# Patient Record
Sex: Female | Born: 1945 | Race: White | Hispanic: No | Smoking: Never smoker
Health system: Southern US, Community
[De-identification: ages and names within clinical notes are randomized; demographics above are authoritative.]

## PROBLEM LIST (undated history)

## (undated) DIAGNOSIS — I428 Other cardiomyopathies: Secondary | ICD-10-CM

## (undated) DIAGNOSIS — G4733 Obstructive sleep apnea (adult) (pediatric): Secondary | ICD-10-CM

## (undated) DIAGNOSIS — E039 Hypothyroidism, unspecified: Secondary | ICD-10-CM

## (undated) DIAGNOSIS — K219 Gastro-esophageal reflux disease without esophagitis: Secondary | ICD-10-CM

## (undated) DIAGNOSIS — G4730 Sleep apnea, unspecified: Secondary | ICD-10-CM

## (undated) DIAGNOSIS — I10 Essential (primary) hypertension: Secondary | ICD-10-CM

## (undated) DIAGNOSIS — F419 Anxiety disorder, unspecified: Secondary | ICD-10-CM

## (undated) DIAGNOSIS — E785 Hyperlipidemia, unspecified: Secondary | ICD-10-CM

## (undated) DIAGNOSIS — I669 Occlusion and stenosis of unspecified cerebral artery: Secondary | ICD-10-CM

## (undated) HISTORY — DX: Hyperlipidemia, unspecified: E78.5

## (undated) HISTORY — DX: Hypothyroidism, unspecified: E03.9

## (undated) HISTORY — DX: Gastro-esophageal reflux disease without esophagitis: K21.9

## (undated) HISTORY — PX: BLADDER NECK RECONSTRUCTION: SHX1239

## (undated) HISTORY — PX: ABDOMINAL HYSTERECTOMY: SHX81

## (undated) HISTORY — DX: Other cardiomyopathies: I42.8

## (undated) HISTORY — DX: Essential (primary) hypertension: I10

## (undated) HISTORY — DX: Sleep apnea, unspecified: G47.30

## (undated) HISTORY — DX: Occlusion and stenosis of unspecified cerebral artery: I66.9

## (undated) HISTORY — PX: TONSILLECTOMY: SUR1361

## (undated) HISTORY — DX: Obstructive sleep apnea (adult) (pediatric): G47.33

## (undated) HISTORY — DX: Anxiety disorder, unspecified: F41.9

## (undated) HISTORY — PX: SHOULDER SURGERY: SHX246

---

## 1998-12-14 ENCOUNTER — Encounter: Payer: Self-pay | Admitting: Family Medicine

## 1998-12-14 ENCOUNTER — Ambulatory Visit (HOSPITAL_COMMUNITY): Admission: RE | Admit: 1998-12-14 | Discharge: 1998-12-14 | Payer: Self-pay | Admitting: Family Medicine

## 2000-08-08 ENCOUNTER — Encounter: Payer: Self-pay | Admitting: Family Medicine

## 2000-08-08 ENCOUNTER — Ambulatory Visit (HOSPITAL_COMMUNITY): Admission: RE | Admit: 2000-08-08 | Discharge: 2000-08-08 | Payer: Self-pay | Admitting: Family Medicine

## 2001-07-09 ENCOUNTER — Encounter: Payer: Self-pay | Admitting: Family Medicine

## 2001-07-09 ENCOUNTER — Ambulatory Visit (HOSPITAL_COMMUNITY): Admission: RE | Admit: 2001-07-09 | Discharge: 2001-07-09 | Payer: Self-pay | Admitting: Family Medicine

## 2001-12-23 ENCOUNTER — Encounter: Payer: Self-pay | Admitting: Family Medicine

## 2001-12-23 ENCOUNTER — Ambulatory Visit (HOSPITAL_COMMUNITY): Admission: RE | Admit: 2001-12-23 | Discharge: 2001-12-23 | Payer: Self-pay | Admitting: Family Medicine

## 2007-08-11 ENCOUNTER — Ambulatory Visit: Admission: RE | Admit: 2007-08-11 | Discharge: 2007-08-11 | Payer: Self-pay | Admitting: Family Medicine

## 2007-08-11 ENCOUNTER — Ambulatory Visit: Payer: Self-pay | Admitting: Vascular Surgery

## 2007-08-11 ENCOUNTER — Encounter (INDEPENDENT_AMBULATORY_CARE_PROVIDER_SITE_OTHER): Payer: Self-pay | Admitting: Family Medicine

## 2009-09-29 ENCOUNTER — Encounter: Admission: RE | Admit: 2009-09-29 | Discharge: 2009-09-29 | Payer: Self-pay | Admitting: Family Medicine

## 2013-12-23 ENCOUNTER — Other Ambulatory Visit: Payer: Self-pay

## 2014-02-05 ENCOUNTER — Other Ambulatory Visit: Payer: Self-pay

## 2014-02-05 DIAGNOSIS — Z1239 Encounter for other screening for malignant neoplasm of breast: Secondary | ICD-10-CM

## 2014-02-25 ENCOUNTER — Other Ambulatory Visit: Payer: Self-pay

## 2014-02-25 DIAGNOSIS — Z1231 Encounter for screening mammogram for malignant neoplasm of breast: Secondary | ICD-10-CM

## 2014-02-26 ENCOUNTER — Ambulatory Visit: Payer: Self-pay

## 2014-03-01 ENCOUNTER — Ambulatory Visit
Admission: RE | Admit: 2014-03-01 | Discharge: 2014-03-01 | Disposition: A | Discharge status: Home / Self Care | Payer: BC Managed Care – PPO | Source: Ambulatory Visit

## 2014-03-01 DIAGNOSIS — Z1231 Encounter for screening mammogram for malignant neoplasm of breast: Secondary | ICD-10-CM

## 2014-03-04 ENCOUNTER — Other Ambulatory Visit: Payer: Self-pay | Admitting: Family Medicine

## 2014-03-04 DIAGNOSIS — R928 Other abnormal and inconclusive findings on diagnostic imaging of breast: Secondary | ICD-10-CM

## 2014-03-09 ENCOUNTER — Other Ambulatory Visit: Payer: Self-pay | Admitting: Radiology

## 2014-03-16 ENCOUNTER — Other Ambulatory Visit: Payer: BC Managed Care – PPO

## 2014-06-07 DIAGNOSIS — M9901 Segmental and somatic dysfunction of cervical region: Secondary | ICD-10-CM | POA: Diagnosis not present

## 2014-06-07 DIAGNOSIS — M9902 Segmental and somatic dysfunction of thoracic region: Secondary | ICD-10-CM | POA: Diagnosis not present

## 2014-06-07 DIAGNOSIS — M546 Pain in thoracic spine: Secondary | ICD-10-CM | POA: Diagnosis not present

## 2014-06-07 DIAGNOSIS — M542 Cervicalgia: Secondary | ICD-10-CM | POA: Diagnosis not present

## 2014-06-07 DIAGNOSIS — M62830 Muscle spasm of back: Secondary | ICD-10-CM | POA: Diagnosis not present

## 2014-06-07 DIAGNOSIS — M9903 Segmental and somatic dysfunction of lumbar region: Secondary | ICD-10-CM | POA: Diagnosis not present

## 2014-06-10 DIAGNOSIS — M542 Cervicalgia: Secondary | ICD-10-CM | POA: Diagnosis not present

## 2014-06-10 DIAGNOSIS — M546 Pain in thoracic spine: Secondary | ICD-10-CM | POA: Diagnosis not present

## 2014-06-10 DIAGNOSIS — M62830 Muscle spasm of back: Secondary | ICD-10-CM | POA: Diagnosis not present

## 2014-06-10 DIAGNOSIS — M9903 Segmental and somatic dysfunction of lumbar region: Secondary | ICD-10-CM | POA: Diagnosis not present

## 2014-06-10 DIAGNOSIS — M9901 Segmental and somatic dysfunction of cervical region: Secondary | ICD-10-CM | POA: Diagnosis not present

## 2014-06-10 DIAGNOSIS — M9902 Segmental and somatic dysfunction of thoracic region: Secondary | ICD-10-CM | POA: Diagnosis not present

## 2014-06-14 DIAGNOSIS — M542 Cervicalgia: Secondary | ICD-10-CM | POA: Diagnosis not present

## 2014-06-14 DIAGNOSIS — M9902 Segmental and somatic dysfunction of thoracic region: Secondary | ICD-10-CM | POA: Diagnosis not present

## 2014-06-14 DIAGNOSIS — M9901 Segmental and somatic dysfunction of cervical region: Secondary | ICD-10-CM | POA: Diagnosis not present

## 2014-06-14 DIAGNOSIS — M546 Pain in thoracic spine: Secondary | ICD-10-CM | POA: Diagnosis not present

## 2014-06-14 DIAGNOSIS — M62830 Muscle spasm of back: Secondary | ICD-10-CM | POA: Diagnosis not present

## 2014-06-14 DIAGNOSIS — M9903 Segmental and somatic dysfunction of lumbar region: Secondary | ICD-10-CM | POA: Diagnosis not present

## 2014-06-23 DIAGNOSIS — M62830 Muscle spasm of back: Secondary | ICD-10-CM | POA: Diagnosis not present

## 2014-06-23 DIAGNOSIS — M542 Cervicalgia: Secondary | ICD-10-CM | POA: Diagnosis not present

## 2014-06-23 DIAGNOSIS — M9901 Segmental and somatic dysfunction of cervical region: Secondary | ICD-10-CM | POA: Diagnosis not present

## 2014-06-23 DIAGNOSIS — M9902 Segmental and somatic dysfunction of thoracic region: Secondary | ICD-10-CM | POA: Diagnosis not present

## 2014-06-23 DIAGNOSIS — M9903 Segmental and somatic dysfunction of lumbar region: Secondary | ICD-10-CM | POA: Diagnosis not present

## 2014-06-23 DIAGNOSIS — M546 Pain in thoracic spine: Secondary | ICD-10-CM | POA: Diagnosis not present

## 2014-06-30 DIAGNOSIS — M9902 Segmental and somatic dysfunction of thoracic region: Secondary | ICD-10-CM | POA: Diagnosis not present

## 2014-06-30 DIAGNOSIS — M542 Cervicalgia: Secondary | ICD-10-CM | POA: Diagnosis not present

## 2014-06-30 DIAGNOSIS — M9901 Segmental and somatic dysfunction of cervical region: Secondary | ICD-10-CM | POA: Diagnosis not present

## 2014-06-30 DIAGNOSIS — M9903 Segmental and somatic dysfunction of lumbar region: Secondary | ICD-10-CM | POA: Diagnosis not present

## 2014-06-30 DIAGNOSIS — M62830 Muscle spasm of back: Secondary | ICD-10-CM | POA: Diagnosis not present

## 2014-06-30 DIAGNOSIS — M546 Pain in thoracic spine: Secondary | ICD-10-CM | POA: Diagnosis not present

## 2014-07-06 DIAGNOSIS — E039 Hypothyroidism, unspecified: Secondary | ICD-10-CM | POA: Diagnosis not present

## 2014-07-06 DIAGNOSIS — I10 Essential (primary) hypertension: Secondary | ICD-10-CM | POA: Diagnosis not present

## 2014-07-14 DIAGNOSIS — M542 Cervicalgia: Secondary | ICD-10-CM | POA: Diagnosis not present

## 2014-07-14 DIAGNOSIS — M9903 Segmental and somatic dysfunction of lumbar region: Secondary | ICD-10-CM | POA: Diagnosis not present

## 2014-07-14 DIAGNOSIS — M62830 Muscle spasm of back: Secondary | ICD-10-CM | POA: Diagnosis not present

## 2014-07-14 DIAGNOSIS — M9902 Segmental and somatic dysfunction of thoracic region: Secondary | ICD-10-CM | POA: Diagnosis not present

## 2014-07-14 DIAGNOSIS — M9901 Segmental and somatic dysfunction of cervical region: Secondary | ICD-10-CM | POA: Diagnosis not present

## 2014-07-14 DIAGNOSIS — M546 Pain in thoracic spine: Secondary | ICD-10-CM | POA: Diagnosis not present

## 2016-05-09 DIAGNOSIS — R946 Abnormal results of thyroid function studies: Secondary | ICD-10-CM | POA: Diagnosis not present

## 2016-05-09 DIAGNOSIS — R944 Abnormal results of kidney function studies: Secondary | ICD-10-CM | POA: Diagnosis not present

## 2016-05-28 DIAGNOSIS — E785 Hyperlipidemia, unspecified: Secondary | ICD-10-CM | POA: Diagnosis not present

## 2016-05-28 DIAGNOSIS — I429 Cardiomyopathy, unspecified: Secondary | ICD-10-CM | POA: Diagnosis not present

## 2016-05-28 DIAGNOSIS — I428 Other cardiomyopathies: Secondary | ICD-10-CM | POA: Diagnosis not present

## 2016-05-28 DIAGNOSIS — I10 Essential (primary) hypertension: Secondary | ICD-10-CM | POA: Diagnosis not present

## 2016-05-28 DIAGNOSIS — E039 Hypothyroidism, unspecified: Secondary | ICD-10-CM | POA: Diagnosis not present

## 2016-05-28 DIAGNOSIS — R9431 Abnormal electrocardiogram [ECG] [EKG]: Secondary | ICD-10-CM | POA: Diagnosis not present

## 2016-05-28 DIAGNOSIS — I447 Left bundle-branch block, unspecified: Secondary | ICD-10-CM | POA: Diagnosis not present

## 2016-06-13 DIAGNOSIS — R69 Illness, unspecified: Secondary | ICD-10-CM | POA: Diagnosis not present

## 2016-07-09 DIAGNOSIS — E039 Hypothyroidism, unspecified: Secondary | ICD-10-CM | POA: Diagnosis not present

## 2016-11-26 DIAGNOSIS — I429 Cardiomyopathy, unspecified: Secondary | ICD-10-CM | POA: Diagnosis not present

## 2016-11-26 DIAGNOSIS — I447 Left bundle-branch block, unspecified: Secondary | ICD-10-CM | POA: Diagnosis not present

## 2016-11-26 DIAGNOSIS — I428 Other cardiomyopathies: Secondary | ICD-10-CM | POA: Diagnosis not present

## 2016-11-26 DIAGNOSIS — E785 Hyperlipidemia, unspecified: Secondary | ICD-10-CM | POA: Diagnosis not present

## 2016-11-26 DIAGNOSIS — E039 Hypothyroidism, unspecified: Secondary | ICD-10-CM | POA: Diagnosis not present

## 2016-11-26 DIAGNOSIS — I119 Hypertensive heart disease without heart failure: Secondary | ICD-10-CM | POA: Diagnosis not present

## 2016-11-26 DIAGNOSIS — I10 Essential (primary) hypertension: Secondary | ICD-10-CM | POA: Diagnosis not present

## 2016-12-11 DIAGNOSIS — R69 Illness, unspecified: Secondary | ICD-10-CM | POA: Diagnosis not present

## 2016-12-11 DIAGNOSIS — M7751 Other enthesopathy of right foot: Secondary | ICD-10-CM | POA: Diagnosis not present

## 2016-12-11 DIAGNOSIS — G5761 Lesion of plantar nerve, right lower limb: Secondary | ICD-10-CM | POA: Diagnosis not present

## 2016-12-18 DIAGNOSIS — M7731 Calcaneal spur, right foot: Secondary | ICD-10-CM | POA: Diagnosis not present

## 2016-12-18 DIAGNOSIS — M7751 Other enthesopathy of right foot: Secondary | ICD-10-CM | POA: Diagnosis not present

## 2016-12-18 DIAGNOSIS — G5761 Lesion of plantar nerve, right lower limb: Secondary | ICD-10-CM | POA: Diagnosis not present

## 2017-02-22 DIAGNOSIS — Z23 Encounter for immunization: Secondary | ICD-10-CM | POA: Diagnosis not present

## 2017-03-22 DIAGNOSIS — M71571 Other bursitis, not elsewhere classified, right ankle and foot: Secondary | ICD-10-CM | POA: Diagnosis not present

## 2017-03-22 DIAGNOSIS — G5761 Lesion of plantar nerve, right lower limb: Secondary | ICD-10-CM | POA: Diagnosis not present

## 2017-03-22 DIAGNOSIS — M722 Plantar fascial fibromatosis: Secondary | ICD-10-CM | POA: Diagnosis not present

## 2017-03-29 DIAGNOSIS — M71571 Other bursitis, not elsewhere classified, right ankle and foot: Secondary | ICD-10-CM | POA: Diagnosis not present

## 2017-03-29 DIAGNOSIS — M722 Plantar fascial fibromatosis: Secondary | ICD-10-CM | POA: Diagnosis not present

## 2017-05-29 DIAGNOSIS — R69 Illness, unspecified: Secondary | ICD-10-CM | POA: Diagnosis not present

## 2017-06-03 DIAGNOSIS — I447 Left bundle-branch block, unspecified: Secondary | ICD-10-CM | POA: Diagnosis not present

## 2017-06-03 DIAGNOSIS — I119 Hypertensive heart disease without heart failure: Secondary | ICD-10-CM | POA: Diagnosis not present

## 2017-06-03 DIAGNOSIS — E785 Hyperlipidemia, unspecified: Secondary | ICD-10-CM | POA: Diagnosis not present

## 2017-06-03 DIAGNOSIS — I429 Cardiomyopathy, unspecified: Secondary | ICD-10-CM | POA: Diagnosis not present

## 2017-06-03 DIAGNOSIS — E7849 Other hyperlipidemia: Secondary | ICD-10-CM | POA: Diagnosis not present

## 2017-06-03 DIAGNOSIS — I428 Other cardiomyopathies: Secondary | ICD-10-CM | POA: Diagnosis not present

## 2017-06-03 DIAGNOSIS — E039 Hypothyroidism, unspecified: Secondary | ICD-10-CM | POA: Diagnosis not present

## 2017-06-25 DIAGNOSIS — Z1231 Encounter for screening mammogram for malignant neoplasm of breast: Secondary | ICD-10-CM | POA: Diagnosis not present

## 2017-08-06 DIAGNOSIS — I10 Essential (primary) hypertension: Secondary | ICD-10-CM | POA: Diagnosis not present

## 2017-08-06 DIAGNOSIS — J069 Acute upper respiratory infection, unspecified: Secondary | ICD-10-CM | POA: Diagnosis not present

## 2017-08-06 DIAGNOSIS — E785 Hyperlipidemia, unspecified: Secondary | ICD-10-CM | POA: Diagnosis not present

## 2017-08-06 DIAGNOSIS — R400 Somnolence: Secondary | ICD-10-CM | POA: Diagnosis not present

## 2017-08-06 DIAGNOSIS — E039 Hypothyroidism, unspecified: Secondary | ICD-10-CM | POA: Diagnosis not present

## 2017-09-05 DIAGNOSIS — G4733 Obstructive sleep apnea (adult) (pediatric): Secondary | ICD-10-CM | POA: Diagnosis not present

## 2017-09-10 DIAGNOSIS — H524 Presbyopia: Secondary | ICD-10-CM | POA: Diagnosis not present

## 2017-09-10 DIAGNOSIS — H35363 Drusen (degenerative) of macula, bilateral: Secondary | ICD-10-CM | POA: Diagnosis not present

## 2017-10-03 DIAGNOSIS — G4733 Obstructive sleep apnea (adult) (pediatric): Secondary | ICD-10-CM | POA: Diagnosis not present

## 2017-10-17 DIAGNOSIS — H35363 Drusen (degenerative) of macula, bilateral: Secondary | ICD-10-CM | POA: Diagnosis not present

## 2017-10-24 DIAGNOSIS — M722 Plantar fascial fibromatosis: Secondary | ICD-10-CM | POA: Diagnosis not present

## 2017-12-02 DIAGNOSIS — I429 Cardiomyopathy, unspecified: Secondary | ICD-10-CM | POA: Diagnosis not present

## 2017-12-02 DIAGNOSIS — E039 Hypothyroidism, unspecified: Secondary | ICD-10-CM | POA: Diagnosis not present

## 2017-12-02 DIAGNOSIS — E669 Obesity, unspecified: Secondary | ICD-10-CM | POA: Diagnosis not present

## 2017-12-02 DIAGNOSIS — I447 Left bundle-branch block, unspecified: Secondary | ICD-10-CM | POA: Diagnosis not present

## 2017-12-02 DIAGNOSIS — I119 Hypertensive heart disease without heart failure: Secondary | ICD-10-CM | POA: Diagnosis not present

## 2017-12-02 DIAGNOSIS — E785 Hyperlipidemia, unspecified: Secondary | ICD-10-CM | POA: Diagnosis not present

## 2017-12-02 DIAGNOSIS — E7849 Other hyperlipidemia: Secondary | ICD-10-CM | POA: Diagnosis not present

## 2017-12-02 DIAGNOSIS — I428 Other cardiomyopathies: Secondary | ICD-10-CM | POA: Diagnosis not present

## 2018-03-11 DIAGNOSIS — K219 Gastro-esophageal reflux disease without esophagitis: Secondary | ICD-10-CM | POA: Diagnosis not present

## 2018-03-11 DIAGNOSIS — Z23 Encounter for immunization: Secondary | ICD-10-CM | POA: Diagnosis not present

## 2018-03-11 DIAGNOSIS — Z1389 Encounter for screening for other disorder: Secondary | ICD-10-CM | POA: Diagnosis not present

## 2018-03-11 DIAGNOSIS — E039 Hypothyroidism, unspecified: Secondary | ICD-10-CM | POA: Diagnosis not present

## 2018-03-11 DIAGNOSIS — I429 Cardiomyopathy, unspecified: Secondary | ICD-10-CM | POA: Diagnosis not present

## 2018-03-11 DIAGNOSIS — Z1211 Encounter for screening for malignant neoplasm of colon: Secondary | ICD-10-CM | POA: Diagnosis not present

## 2018-03-11 DIAGNOSIS — E785 Hyperlipidemia, unspecified: Secondary | ICD-10-CM | POA: Diagnosis not present

## 2018-03-11 DIAGNOSIS — Z0000 Encounter for general adult medical examination without abnormal findings: Secondary | ICD-10-CM | POA: Diagnosis not present

## 2018-03-11 DIAGNOSIS — I10 Essential (primary) hypertension: Secondary | ICD-10-CM | POA: Diagnosis not present

## 2018-03-17 DIAGNOSIS — Z1211 Encounter for screening for malignant neoplasm of colon: Secondary | ICD-10-CM | POA: Diagnosis not present

## 2018-06-04 ENCOUNTER — Ambulatory Visit: Payer: Medicare HMO | Admitting: Cardiology

## 2018-06-04 ENCOUNTER — Encounter: Payer: Self-pay | Admitting: Cardiology

## 2018-06-04 DIAGNOSIS — E785 Hyperlipidemia, unspecified: Secondary | ICD-10-CM | POA: Diagnosis not present

## 2018-06-04 DIAGNOSIS — I428 Other cardiomyopathies: Secondary | ICD-10-CM

## 2018-06-04 DIAGNOSIS — I447 Left bundle-branch block, unspecified: Secondary | ICD-10-CM

## 2018-06-04 DIAGNOSIS — I10 Essential (primary) hypertension: Secondary | ICD-10-CM | POA: Insufficient documentation

## 2018-06-04 DIAGNOSIS — I5043 Acute on chronic combined systolic (congestive) and diastolic (congestive) heart failure: Secondary | ICD-10-CM | POA: Insufficient documentation

## 2018-06-04 HISTORY — DX: Other cardiomyopathies: I42.8

## 2018-06-04 HISTORY — DX: Left bundle-branch block, unspecified: I44.7

## 2018-06-04 MED ORDER — ATORVASTATIN CALCIUM 10 MG PO TABS: 10.0000 mg | ORAL_TABLET | Freq: Every day | ORAL | 3 refills | Status: DC

## 2018-06-04 NOTE — Patient Instructions (Addendum)
Medication Instructions:  Your physician has recommended you make the following change in your medication:   START atorvastatin (lipitor) 10 mg: Take 1 tablet daily  If you need a refill on your cardiac medications before your next appointment, please call your pharmacy.   Lab work: Your physician recommends that you return for lab work in 6 weeks: lipid panel, hepatic function panel. Please return to our office for lab work, no appointment needed. Please fast beforehand.   If you have labs (blood work) drawn today and your tests are completely normal, you will receive your results only by: Marland Kitchen MyChart Message (if you have MyChart) OR . A paper copy in the mail If you have any lab test that is abnormal or we need to change your treatment, we will call you to review the results.  Testing/Procedures: You had an EKG today.   Follow-Up: At [facility], you and your health needs are our priority.  As part of our continuing mission to provide you with exceptional heart care, we have created designated Provider Care Teams.  These Care Teams include your primary Cardiologist (physician) and Advanced Practice Providers (APPs -  Physician Assistants and Nurse Practitioners) who all work together to provide you with the care you need, when you need it. You will need a follow up appointment in 6 months.  Please call our office 2 months in advance to schedule this appointment.      Atorvastatin tablets What is this medicine? ATORVASTATIN (a TORE va sta tin) is known as a HMG-CoA reductase inhibitor or 'statin'. It lowers the level of cholesterol and triglycerides in the blood. This drug may also reduce the risk of heart attack, stroke, or other health problems in patients with risk factors for heart disease. Diet and lifestyle changes are often used with this drug. This medicine may be used for other purposes; ask your health care provider or pharmacist if you have questions. COMMON BRAND NAME(S):  Lipitor What should I tell my health care provider before I take this medicine? They need to know if you have any of these conditions: -diabetes -if you often drink alcohol -history of stroke -kidney disease -liver disease -muscle aches or weakness -thyroid disease -an unusual or allergic reaction to atorvastatin, other medicines, foods, dyes, or preservatives -pregnant or trying to get pregnant -breast-feeding How should I use this medicine? Take this medicine by mouth with a glass of water. Follow the directions on the prescription label. You can take it with or without food. If it upsets your stomach, take it with food. Do not take with grapefruit juice. Take your medicine at regular intervals. Do not take it more often than directed. Do not stop taking except on your doctor's advice. Talk to your pediatrician regarding the use of this medicine in children. While this drug may be prescribed for children as young as 10 for selected conditions, precautions do apply. Overdosage: If you think you have taken too much of this medicine contact a poison control center or emergency room at once. NOTE: This medicine is only for you. Do not share this medicine with others. What if I miss a dose? If you miss a dose, take it as soon as you can. If your next dose is to be taken in less than 12 hours, then do not take the missed dose. Take the next dose at your regular time. Do not take double or extra doses. What may interact with this medicine? Do not take this medicine with  any of the following medications: -dasabuvir; ombitasvir; paritaprevir; ritonavir -ombitasvir; paritaprevir; ritonavir -posaconazole -red yeast rice This medicine may also interact with the following medications: -alcohol -birth control pills -certain antibiotics like erythromycin and clarithromycin -certain antivirals for HIV or hepatitis -certain medicines for cholesterol like fenofibrate, gemfibrozil, and niacin -certain  medicines for fungal infections like ketoconazole and itraconazole -colchicine -cyclosporine -digoxin -grapefruit juice -rifampin This list may not describe all possible interactions. Give your health care provider a list of all the medicines, herbs, non-prescription drugs, or dietary supplements you use. Also tell them if you smoke, drink alcohol, or use illegal drugs. Some items may interact with your medicine. What should I watch for while using this medicine? Visit your doctor or health care professional for regular check-ups. You may need regular tests to make sure your liver is working properly. Your health care professional may tell you to stop taking this medicine if you develop muscle problems. If your muscle problems do not go away after stopping this medicine, contact your health care professional. Do not become pregnant while taking this medicine. Women should inform their health care professional if they wish to become pregnant or think they might be pregnant. There is a potential for serious side effects to an unborn child. Talk to your health care professional or pharmacist for more information. Do not breast-feed an infant while taking this medicine. This medicine may affect blood sugar levels. If you have diabetes, check with your doctor or health care professional before you change your diet or the dose of your diabetic medicine. If you are going to need surgery or other procedure, tell your doctor that you are using this medicine. This drug is only part of a total heart-health program. Your doctor or a dietician can suggest a low-cholesterol and low-fat diet to help. Avoid alcohol and smoking, and keep a proper exercise schedule. This medicine may cause a decrease in Co-Enzyme Q-10. You should make sure that you get enough Co-Enzyme Q-10 while you are taking this medicine. Discuss the foods you eat and the vitamins you take with your health care professional. What side effects may I  notice from receiving this medicine? Side effects that you should report to your doctor or health care professional as soon as possible: -allergic reactions like skin rash, itching or hives, swelling of the face, lips, or tongue -fever -joint pain -loss of memory -redness, blistering, peeling or loosening of the skin, including inside the mouth -signs and symptoms of liver injury like dark yellow or brown urine; general ill feeling or flu-like symptoms; light-belly pain; unusually weak or tired; yellowing of the eyes or skin -signs and symptoms of muscle injury like dark urine; trouble passing urine or change in the amount of urine; unusually weak or tired; muscle pain or side or back pain Side effects that usually do not require medical attention (report to your doctor or health care professional if they continue or are bothersome): -diarrhea -nausea -stomach pain -trouble sleeping -upset stomach This list may not describe all possible side effects. Call your doctor for medical advice about side effects. You may report side effects to FDA at 1-800-FDA-1088. Where should I keep my medicine? Keep out of the reach of children. Store between 20 and 25 degrees C (68 and 77 degrees F). Throw away any unused medicine after the expiration date. NOTE: This sheet is a summary. It may not cover all possible information. If you have questions about this medicine, talk to your doctor, pharmacist, or  health care provider.  2019 Elsevier/Gold Standard (2017-08-23 11:36:48)

## 2018-06-04 NOTE — Progress Notes (Signed)
Cardiology Office Note:    Date:  06/04/2018   ID:  Stefanie Camacho, DOB [birth year], MRN [id number]  PCP:  [name], MD  Cardiologist:  Jenne Campus, MD    Referring MD: No ref. provider found   Chief Complaint  Patient presents with  . Follow-up  Doing well  History of Present Illness:    Stefanie Camacho is a 73 y.o. female who is patient of Dr. Wynonia Lawman she does have chronic left bundle branch block.  3 years ago she had a stress test which was negative last year she got echocardiogram which showed ejection fraction 45% which is stable overall she is doing well denies have any chest pain tightness squeezing pressure burning chest.  She walks on a regular basis with her dog named Lucy.  Denies have any swelling of lower extremities no dizziness no passing out overall doing well  No past medical history on file.    Current Medications: Current Meds  Medication Sig  . aspirin EC 81 MG tablet Take 81 mg by mouth daily.  . Cyanocobalamin (B-12) 2500 MCG TABS Take 1 tablet by mouth daily.  Marland Kitchen levothyroxine (SYNTHROID, LEVOTHROID) 25 MCG tablet Take 1 tablet by mouth daily.  Marland Kitchen losartan (COZAAR) 100 MG tablet Take 100 mg by mouth daily.  . metoprolol succinate (TOPROL-XL) 50 MG 24 hr tablet Take 50 mg by mouth daily.  Marland Kitchen omeprazole (PRILOSEC) 20 MG capsule Take 20 mg by mouth daily.  Marland Kitchen spironolactone (ALDACTONE) 25 MG tablet Take 12.5 mg by mouth daily.     Allergies:   Patient has no known allergies.   Social History   Socioeconomic History  . Marital status: Divorced    Spouse name: Not on file  . Number of children: Not on file  . Years of education: Not on file  . Highest education level: Not on file  Occupational History  . Not on file  Social Needs  . Financial resource strain: Not on file  . Food insecurity:    Worry: Not on file    Inability: Not on file  . Transportation needs:    Medical: Not on file    Non-medical: Not on file  Tobacco Use  . Smoking  status: Never Smoker  . Smokeless tobacco: Never Used  Substance and Sexual Activity  . Alcohol use: Not Currently  . Drug use: Never  . Sexual activity: Not on file  Lifestyle  . Physical activity:    Days per week: Not on file    Minutes per session: Not on file  . Stress: Not on file  Relationships  . Social connections:    Talks on phone: Not on file    Gets together: Not on file    Attends religious service: Not on file    Active member of club or organization: Not on file    Attends meetings of clubs or organizations: Not on file    Relationship status: Not on file  Other Topics Concern  . Not on file  Social History Narrative  . Not on file     Family History: The patient's family history includes Heart disease in her father, maternal grandfather, mother, and paternal grandfather; Stroke in her father and mother. ROS:   Please see the history of present illness.    All 14 point review of systems negative except as described per history of present illness  EKGs/Labs/Other Studies Reviewed:      Recent Labs: No results found for requested labs within  last 8760 hours.  Recent Lipid Panel No results found for: CHOL, TRIG, HDL, CHOLHDL, VLDL, LDLCALC, LDLDIRECT  Physical Exam:    VS:  BP 124/74   Pulse 67   Ht 5\' 6"  (1.676 m)   Wt 198 lb 6.4 oz (90 kg)   SpO2 97%   BMI 32.02 kg/m     Wt Readings from Last 3 Encounters:  06/04/18 198 lb 6.4 oz (90 kg)     GEN:  Well nourished, well developed in no acute distress HEENT: Normal NECK: No JVD; No carotid bruits LYMPHATICS: No lymphadenopathy CARDIAC: RRR, no murmurs, no rubs, no gallops RESPIRATORY:  Clear to auscultation without rales, wheezing or rhonchi  ABDOMEN: Soft, non-tender, non-distended MUSCULOSKELETAL:  No edema; No deformity  SKIN: Warm and dry LOWER EXTREMITIES: no swelling NEUROLOGIC:  Alert and oriented x 3 PSYCHIATRIC:  Normal affect   ASSESSMENT:    1. Nonischemic cardiomyopathy  ([place])   2. Essential hypertension   3. Left bundle branch block   4. Dyslipidemia    PLAN:    In order of problems listed above:  1. 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  We spent very little time talking about the reason for this medication she understands she will continue. 2. Essential hypertension blood pressure well controlled continue present management. 3. Left bundle branch block we will repeat EKG and will give her a copy 4. Dyslipidemia I was able to convince her to start small dose of statin will start with 10 mg Lipitor.  In 6 weeks we will recheck fasting lipid profile as well as liver function test.   Medication Adjustments/Labs and Tests Ordered: Current medicines are reviewed at length with the patient today.  Concerns regarding medicines are outlined above.  No orders of the defined types were placed in this encounter.  Medication changes: No orders of the defined types were placed in this encounter.   Signed, Park Liter, MD, [facility] 06/04/2018 9:01 AM    [place]

## 2018-07-07 ENCOUNTER — Telehealth: Payer: Self-pay | Admitting: Cardiology

## 2018-07-07 MED ORDER — LOSARTAN POTASSIUM 100 MG PO TABS: 100.0000 mg | ORAL_TABLET | Freq: Every day | ORAL | 0 refills | Status: DC

## 2018-07-07 NOTE — Telephone Encounter (Signed)
Losartan 100 mg daily refilled

## 2018-07-07 NOTE — Telephone Encounter (Signed)
°*  STAT* If patient is at the pharmacy, call can be transferred to refill team.   1. Which medications need to be refilled? (please list name of each medication and dose if known) Losartan 100mg   2. Which pharmacy/location (including street and city if local pharmacy) is [facility] [address] [place]  3. Do they need a 30 day or 90 day supply? [place]

## 2018-07-11 DIAGNOSIS — R69 Illness, unspecified: Secondary | ICD-10-CM | POA: Diagnosis not present

## 2018-07-14 ENCOUNTER — Telehealth: Payer: Self-pay | Admitting: *Deleted

## 2018-07-14 MED ORDER — METOPROLOL SUCCINATE ER 50 MG PO TB24: 50.0000 mg | ORAL_TABLET | Freq: Every day | ORAL | 1 refills | Status: DC

## 2018-07-14 NOTE — Telephone Encounter (Signed)
*  STAT* If patient is at the pharmacy, call can be transferred to refill team.   1. Which medications need to be refilled? (please list name of each medication and dose if known) Metoprolol 50mg  qd  2. Which pharmacy/location (including street and city if local pharmacy) is medication to be sent to?[facility] [address]   3. Do they need a 30 day or 90 day supply? [place]

## 2018-07-16 DIAGNOSIS — Z1231 Encounter for screening mammogram for malignant neoplasm of breast: Secondary | ICD-10-CM | POA: Diagnosis not present

## 2018-07-17 DIAGNOSIS — E785 Hyperlipidemia, unspecified: Secondary | ICD-10-CM | POA: Diagnosis not present

## 2018-07-18 LAB — LIPID PANEL
Chol/HDL Ratio: 3.3 ratio (ref 0.0–4.4)
Cholesterol, Total: 133 mg/dL (ref 100–199)
HDL: 40 mg/dL (ref >39)
LDL Calculated: 67 mg/dL (ref 0–99)
Triglycerides: 130 mg/dL (ref 0–149)
VLDL Cholesterol Cal: 26 mg/dL (ref 5–40)

## 2018-07-18 LAB — HEPATIC FUNCTION PANEL
ALT: 28 IU/L (ref 0–32)
AST: 26 IU/L (ref 0–40)
Albumin: 4.1 g/dL (ref 3.7–4.7)
Alkaline Phosphatase: 137 IU/L — ABNORMAL HIGH (ref 39–117)
Bilirubin Total: 0.6 mg/dL (ref 0.0–1.2)
Bilirubin, Direct: 0.15 mg/dL (ref 0.00–0.40)
Total Protein: 6.9 g/dL (ref 6.0–8.5)

## 2018-07-28 ENCOUNTER — Telehealth: Payer: Self-pay | Admitting: Cardiology

## 2018-07-28 MED ORDER — SPIRONOLACTONE 25 MG PO TABS: 12.5000 mg | ORAL_TABLET | Freq: Every day | ORAL | 1 refills | Status: DC

## 2018-07-28 NOTE — Telephone Encounter (Signed)
Patient needs refill on her ALDACTONE sent to her pharmacy,  [id number] - [place], [place] - [address] [phone] (Phone) [phone] (Fax)

## 2018-07-28 NOTE — Telephone Encounter (Signed)
Aldactone 12.5 mg daily refilled.

## 2018-09-09 DIAGNOSIS — E039 Hypothyroidism, unspecified: Secondary | ICD-10-CM | POA: Diagnosis not present

## 2018-09-09 DIAGNOSIS — I10 Essential (primary) hypertension: Secondary | ICD-10-CM | POA: Diagnosis not present

## 2018-09-09 DIAGNOSIS — K219 Gastro-esophageal reflux disease without esophagitis: Secondary | ICD-10-CM | POA: Diagnosis not present

## 2018-09-09 DIAGNOSIS — R200 Anesthesia of skin: Secondary | ICD-10-CM | POA: Diagnosis not present

## 2018-09-21 ENCOUNTER — Other Ambulatory Visit: Payer: Self-pay | Admitting: Cardiology

## 2018-10-06 ENCOUNTER — Other Ambulatory Visit: Payer: Self-pay

## 2018-10-06 MED ORDER — LOSARTAN POTASSIUM 100 MG PO TABS: 100.0000 mg | ORAL_TABLET | Freq: Every day | ORAL | 0 refills | Status: DC

## 2018-10-08 DIAGNOSIS — R69 Illness, unspecified: Secondary | ICD-10-CM | POA: Diagnosis not present

## 2018-12-19 DIAGNOSIS — I10 Essential (primary) hypertension: Secondary | ICD-10-CM | POA: Diagnosis not present

## 2018-12-28 ENCOUNTER — Encounter: Payer: Self-pay | Admitting: Family

## 2018-12-28 DIAGNOSIS — I428 Other cardiomyopathies: Secondary | ICD-10-CM | POA: Insufficient documentation

## 2018-12-28 DIAGNOSIS — E039 Hypothyroidism, unspecified: Secondary | ICD-10-CM | POA: Insufficient documentation

## 2018-12-28 NOTE — Progress Notes (Signed)
Office Visit    Patient Name: Stefanie Camacho Date of Encounter: 12/29/2018  Primary Care Provider:  [name], MD Primary Cardiologist:  Jenne Campus, MD  Chief Complaint    73 yo female with PMH HTN, DLD, NICM presents for six month follow up of cardiac conditions.  Past Medical History    Past Medical History:  Diagnosis Date  . Anxiety   . Dyslipidemia   . Essential hypertension   . GERD (gastroesophageal reflux disease)   . Hypothyroidism   . NICM (nonischemic cardiomyopathy) [facility])    Past Surgical History:  Procedure Laterality Date  . ABDOMINAL HYSTERECTOMY    . BLADDER NECK RECONSTRUCTION    . SHOULDER SURGERY      Allergies  No Known Allergies  History of Present Illness    Stefanie Camacho is a 73 y.o. female with a hx of LBBB, NICM, HTN, DLD, hypothyroidism last seen 06/04/18. She had a normal stress test around 2017 per previous notes. She had an echo 2019 per previous notes with EF 45%.   Reports feeling overwhelmed well.  She does endorse some tiredness and fatigue.  She did a telephone call with her PCP in June and was started on citalopram at 10 mg.  She does endorse that she originally was taking in the morning and has had some improvement in her daytime to fatigue after switching dosing to in the evening.  She does not check her blood pressure routinely at home.  She tells me she intermittently gets lightheaded with positional changes.  She does have a history of vertigo.  She denies palpitations, chest pain, edema.  She does endorse episodes of "deep breathing "where she will be sitting still and suddenly take a deep breath.  Tells me this is not bothersome, not associated with palpitation nor chest pain.  Reports exercising by playing with her 22-year-old granddaughter and walking her dog.  She has some mild dyspnea while walking the dog but reports this is the same as 6 months ago as she walks her dog up a hill.  Brings me labs from her PCP for  review on 12/19/2018 with normal BUN, normal creatinine, GFR 55, normal electrolytes, normal liver function.  She is concerned that her GFR is 55 and we discussed mechanisms of protecting her kidneys such as losartan and remaining adequately hydrated.  EKGs/Labs/Other Studies Reviewed:   The following studies were reviewed today:  EKG:  No EKG today. EKG reviewed from 06/04/18 shows SR with LBBB.  Recent Labs: 07/17/2018: ALT 28  Recent Lipid Panel    Component Value Date/Time   CHOL 133 07/17/2018 0933   TRIG 130 07/17/2018 0933   HDL 40 07/17/2018 0933   CHOLHDL 3.3 07/17/2018 0933   LDLCALC 67 07/17/2018 0933    Home Medications   Current Meds  Medication Sig  . aspirin EC 81 MG tablet Take 81 mg by mouth daily.  . citalopram (CELEXA) 20 MG tablet Take 20 mg by mouth daily.  . Cyanocobalamin (B-12) 2500 MCG TABS Take 1 tablet by mouth daily.  Marland Kitchen levothyroxine (SYNTHROID, LEVOTHROID) 25 MCG tablet Take 1 tablet by mouth daily. Pt takes 1 and 1/2 daily  . losartan (COZAAR) 100 MG tablet Take 1 tablet (100 mg total) by mouth daily.  . metoprolol succinate (TOPROL-XL) 50 MG 24 hr tablet Take 1 tablet (50 mg total) by mouth daily.  Marland Kitchen omeprazole (PRILOSEC) 20 MG capsule Take 20 mg by mouth daily.  Marland Kitchen spironolactone (ALDACTONE) 25 MG  tablet Take 0.5 tablets (12.5 mg total) by mouth daily.      Review of Systems      Review of Systems  Constitution: Positive for malaise/fatigue. Negative for chills and fever.  Cardiovascular: Negative for chest pain, dyspnea on exertion, irregular heartbeat, leg swelling, near-syncope and palpitations.  Respiratory: Negative for cough, shortness of breath and wheezing.   Gastrointestinal: Negative for nausea and vomiting.  Neurological: Positive for light-headedness and vertigo. Negative for dizziness and weakness.   All other systems reviewed and are otherwise negative except as noted above.  Physical Exam    VS:  Ht 5\' 6"  (1.676 m)   Wt 200  lb (90.7 kg)   BMI 32.28 kg/m  , BMI Body mass index is 32.28 kg/m. GEN: Well nourished, well developed, in no acute distress. HEENT: normal. Neck: Supple, no JVD, carotid bruits, or masses. Cardiac: RRR, no murmurs, rubs, or gallops. No clubbing, cyanosis, edema.  Radials/DP/PT 2+ and equal bilaterally.  Respiratory:  Respirations regular and unlabored, clear to auscultation bilaterally. GI: Soft, nontender, nondistended, BS + x 4. MS: No deformity or atrophy. Skin: Warm and dry, no rash. Neuro:  Strength and sensation are intact. Psych: Normal affect.   Assessment & Plan    1. NICM -euvolemic on exam, no chest pain, no edema, mild dyspnea on exertion at baseline.  No indication for repeat echocardiogram at this time.  May consider updating at next office visit.  GDMT aspirin, beta-blocker, ARB, MRA. 2. HTN -BP low normal today (110/80) with complaint of fatigue and intermittent orthostatic hypotension.  Will reduce losartan to 50 mg daily.  She will check her blood pressure daily after dose change and inform us if her systolic blood pressure is consistently greater than 130. 3. Fatigue- etiology hypotension versus thyroid versus depression versus medications.  May be secondary to her new Celexa.  Encouraged her to follow-up with her PCP.  We will reduce her losartan today as above.  Low suspicion etiology due to beta-blocker as she has been on this dose for some time and fatigue is new. 4. LBBB - Noted history. Present on EKG 05/2018. Denies pre-syncope, syncope. Update EKG at next office visit.  5. DLD - Lipid panel 07/2018 with LDL 67.  Normal liver enzymes 12/2018.  Continue atorvastatin.  Disposition: Follow-up in 6 months with Dr. Moshe Cipro, NP 12/29/2018, 9:25 AM

## 2018-12-29 ENCOUNTER — Encounter: Payer: Self-pay | Admitting: Family

## 2018-12-29 ENCOUNTER — Other Ambulatory Visit: Payer: Self-pay

## 2018-12-29 ENCOUNTER — Ambulatory Visit (INDEPENDENT_AMBULATORY_CARE_PROVIDER_SITE_OTHER): Payer: Medicare HMO | Admitting: Family

## 2018-12-29 VITALS — BP 110/80 | HR 66 | Ht 66.0 in | Wt 200.0 lb

## 2018-12-29 DIAGNOSIS — F419 Anxiety disorder, unspecified: Secondary | ICD-10-CM

## 2018-12-29 DIAGNOSIS — G4733 Obstructive sleep apnea (adult) (pediatric): Secondary | ICD-10-CM | POA: Insufficient documentation

## 2018-12-29 DIAGNOSIS — E785 Hyperlipidemia, unspecified: Secondary | ICD-10-CM | POA: Diagnosis not present

## 2018-12-29 DIAGNOSIS — I10 Essential (primary) hypertension: Secondary | ICD-10-CM | POA: Diagnosis not present

## 2018-12-29 DIAGNOSIS — G4730 Sleep apnea, unspecified: Secondary | ICD-10-CM | POA: Insufficient documentation

## 2018-12-29 DIAGNOSIS — I447 Left bundle-branch block, unspecified: Secondary | ICD-10-CM

## 2018-12-29 DIAGNOSIS — I428 Other cardiomyopathies: Secondary | ICD-10-CM | POA: Diagnosis not present

## 2018-12-29 DIAGNOSIS — R69 Illness, unspecified: Secondary | ICD-10-CM | POA: Diagnosis not present

## 2018-12-29 DIAGNOSIS — K219 Gastro-esophageal reflux disease without esophagitis: Secondary | ICD-10-CM | POA: Insufficient documentation

## 2018-12-29 MED ORDER — LOSARTAN POTASSIUM 50 MG PO TABS: 50.0000 mg | ORAL_TABLET | Freq: Every day | ORAL | 1 refills | Status: DC

## 2018-12-29 NOTE — Patient Instructions (Signed)
Medication Instructions:   Your physician has recommended you make the following change in your medication:  CHANGE Losartan to 50mg  daily  A new prescription for 50mg  tablets has been sent to your pharmacy.  You may use up your old 100mg  tablets by splitting them in half.   If you need a refill on your cardiac medications before your next appointment, please call your pharmacy.   Lab work: No lab work today. Labs from your PCP look good!  If you have labs (blood work) drawn today and your tests are completely normal, you will receive your results only by: Marland Kitchen MyChart Message (if you have MyChart) OR . A paper copy in the mail If you have any lab test that is abnormal or we need to change your treatment, we will call you to review the results.  Testing/Procedures: None ordered today.  Follow-Up: At [facility], you and your health needs are our priority.  As part of our continuing mission to provide you with exceptional heart care, we have created designated Provider Care Teams.  These Care Teams include your primary Cardiologist (physician) and Advanced Practice Providers (APPs -  Physician Assistants and Nurse Practitioners) who all work together to provide you with the care you need, when you need it. You will need a follow up appointment in 6 months.  Please call our office 2 months in advance to schedule this appointment.  You may see Jenne Campus, MD or another member of our [place] Provider Team in [facility]:   Any Other Special Instructions Will Be Listed Below (If Applicable).  Please check your blood pressure daily for 1 week after starting the new dose of Losartan. If you are noticing systolic blood pressures (the top number) >130 consistently, please call our office.   Other signs and symptoms to notify our office:  Lower extremity swelling New or worsening shortness of breath Chest pain

## 2018-12-30 ENCOUNTER — Other Ambulatory Visit: Payer: Self-pay | Admitting: Cardiology

## 2019-01-02 DIAGNOSIS — L570 Actinic keratosis: Secondary | ICD-10-CM | POA: Diagnosis not present

## 2019-01-08 DIAGNOSIS — R69 Illness, unspecified: Secondary | ICD-10-CM | POA: Diagnosis not present

## 2019-01-09 ENCOUNTER — Other Ambulatory Visit: Payer: Self-pay | Admitting: Cardiology

## 2019-02-14 ENCOUNTER — Other Ambulatory Visit: Payer: Self-pay | Admitting: Cardiology

## 2019-02-16 NOTE — Telephone Encounter (Signed)
Rx refill sent to pharmacy. 

## 2019-02-23 DIAGNOSIS — L821 Other seborrheic keratosis: Secondary | ICD-10-CM | POA: Diagnosis not present

## 2019-02-23 DIAGNOSIS — L918 Other hypertrophic disorders of the skin: Secondary | ICD-10-CM | POA: Diagnosis not present

## 2019-02-23 DIAGNOSIS — L570 Actinic keratosis: Secondary | ICD-10-CM | POA: Diagnosis not present

## 2019-03-31 DIAGNOSIS — K219 Gastro-esophageal reflux disease without esophagitis: Secondary | ICD-10-CM | POA: Diagnosis not present

## 2019-03-31 DIAGNOSIS — Z0000 Encounter for general adult medical examination without abnormal findings: Secondary | ICD-10-CM | POA: Diagnosis not present

## 2019-03-31 DIAGNOSIS — E785 Hyperlipidemia, unspecified: Secondary | ICD-10-CM | POA: Diagnosis not present

## 2019-03-31 DIAGNOSIS — I429 Cardiomyopathy, unspecified: Secondary | ICD-10-CM | POA: Diagnosis not present

## 2019-03-31 DIAGNOSIS — Z1389 Encounter for screening for other disorder: Secondary | ICD-10-CM | POA: Diagnosis not present

## 2019-03-31 DIAGNOSIS — E039 Hypothyroidism, unspecified: Secondary | ICD-10-CM | POA: Diagnosis not present

## 2019-03-31 DIAGNOSIS — I10 Essential (primary) hypertension: Secondary | ICD-10-CM | POA: Diagnosis not present

## 2019-03-31 DIAGNOSIS — R69 Illness, unspecified: Secondary | ICD-10-CM | POA: Diagnosis not present

## 2019-03-31 DIAGNOSIS — H8110 Benign paroxysmal vertigo, unspecified ear: Secondary | ICD-10-CM | POA: Diagnosis not present

## 2019-04-11 ENCOUNTER — Other Ambulatory Visit: Payer: Self-pay | Admitting: Cardiology

## 2019-04-29 DIAGNOSIS — I10 Essential (primary) hypertension: Secondary | ICD-10-CM | POA: Diagnosis not present

## 2019-05-04 ENCOUNTER — Encounter: Payer: Self-pay | Admitting: Physical Therapy

## 2019-05-04 ENCOUNTER — Ambulatory Visit: Payer: Medicare HMO | Attending: Family Medicine | Admitting: Physical Therapy

## 2019-05-04 ENCOUNTER — Other Ambulatory Visit: Payer: Self-pay

## 2019-05-04 DIAGNOSIS — H8112 Benign paroxysmal vertigo, left ear: Secondary | ICD-10-CM

## 2019-05-04 DIAGNOSIS — R42 Dizziness and giddiness: Secondary | ICD-10-CM | POA: Diagnosis not present

## 2019-05-04 NOTE — Therapy (Signed)
[facility] [address] [place], [place], [address] Phone: [phone]   Fax:  [phone]  Physical Therapy Evaluation  Patient Details  Name: Stefanie Camacho MRN: [id number] Date of Birth: 23-Jul-1945 Referring Provider (PT): Alben Spittle MD   Encounter Date: 05/04/2019  PT End of Session - 05/04/19 1206    Visit Number  1    Number of Visits  2    Date for PT Re-Evaluation  05/25/19   skipped week d/t full schedule   Authorization Type  Aetna Medicare; VL: [place] No Auth    PT Start Time  1107    PT Stop Time  1157    PT Time Calculation (min)  50 min    Activity Tolerance  Patient tolerated treatment well    Behavior During Therapy  [facility] for tasks assessed/performed       Past Medical History:  Diagnosis Date  . Anxiety   . Dyslipidemia   . Essential hypertension   . GERD (gastroesophageal reflux disease)   . Hypothyroidism   . NICM (nonischemic cardiomyopathy) ([place])   . Sleep apnea     Past Surgical History:  Procedure Laterality Date  . ABDOMINAL HYSTERECTOMY    . BLADDER NECK RECONSTRUCTION    . SHOULDER SURGERY    . TONSILLECTOMY      There were no vitals filed for this visit.   Subjective Assessment - 05/04/19 1111    Subjective  Pt states having mild episodes of vertigo recently onset 2 months ago. It got better and over the last week has gotten worse. Bending over and looking up inc symptoms. Sometimes when walking feels like the floor is uneven and lists L. Sx when rolling in bed both directions but greater when rolling to the L. Has had a hx of vertigo and states mother had vertigo repetitively.    Pertinent History  HTN, DLD, NICM, anxiety1/29/20: EF 45% but stable    Limitations  Walking    Patient Stated Goals  get rid of dizziness    Currently in Pain?  No/denies         [facility] PT Assessment - 05/04/19 1229      Assessment   Medical Diagnosis  Vertigo    Referring Provider (PT)   Alben Spittle MD    Onset Date/Surgical Date  03/04/19    Prior Therapy  none for BPPV       Precautions   Precautions  Fall      Restrictions   Weight Bearing Restrictions  No      Balance Screen   Has the patient fallen in the past 6 months  No    Has the patient had a decrease in activity level because of a fear of falling?   No    Is the patient reluctant to leave their home because of a fear of falling?   No      Prior Function   Level of Independence  Independent           Vestibular Assessment - 05/04/19 1114      Vestibular Assessment   General Observation  seated leaning to the R      Symptom Behavior   Subjective history of current problem  See subjective hx    Type of Dizziness   Spinning    Frequency of Dizziness  daily in the AM, progressively gets better throughout the day    Duration of Dizziness  <60s, when waking in the  morning takes about 30 minutes to acclimate to seated and standing positions    Symptom Nature  Positional;Motion provoked    Aggravating Factors  Rolling to left;Rolling to right;Sitting with head tilted back;Forward bending    Relieving Factors  Slow movements;Rest;Closing eyes   avoiding aggravating positions   Progression of Symptoms  Worse    History of similar episodes  at least once annually but typically resolves on own. This episode had improved some but over the course of the last week has worsened.       Positional Testing   Dix-Hallpike  Dix-Hallpike Right;Dix-Hallpike Left      Dix-Hallpike Right   Dix-Hallpike Right Duration  <60s    Dix-Hallpike Right Symptoms  Downbeat Nystagmus      Dix-Hallpike Left   Dix-Hallpike Left Duration  <5s    Dix-Hallpike Left Symptoms  Upbeat, left rotatory nystagmus          Objective measurements completed on examination: See above findings.       Vestibular Treatment/Exercise - 05/04/19 1223      Vestibular Treatment/Exercise   Vestibular Treatment Provided  Canalith  Repositioning    Canalith Repositioning  Epley Manuever Left       EPLEY MANUEVER LEFT   Number of Reps   4    Overall Response   Improved Symptoms     RESPONSE DETAILS LEFT  Initially pt experienced mild dizziness with short duration upbeating and L rotatory nystagmus in the first position and continued with Epley. 2nd repetition pt had inc duration nystagmus lasting <30s and worsened sx and continues with Epley. 3rd repetition pt continued to have nystagmus lasting <15s in duration in first position, therapist brought pt back up to seated allowing for sx to resolve, rapidly brought pt down into first position of Epley with no signs of nystagmus or c/o dizziness, and continued Epley maneuver.              PT Education - 05/04/19 1154    Education Details  PT role, POC, what BPPV is & treatment; if symptomatic can do brandt daroff exercise x5 reps 3x/daily    Person(s) Educated  Patient    Methods  Explanation;Demonstration;Handout    Comprehension  Verbalized understanding       PT Short Term Goals - 05/04/19 1155      PT SHORT TERM GOAL #1   Title  = LTG        PT Long Term Goals - 05/04/19 1229      PT LONG TERM GOAL #1   Title  Patient will demonstrate independence with habituation and balance HEP.    Time  2    Period  Weeks    Status  New    Target Date  05/25/19      PT LONG TERM GOAL #2   Title  Patient will demonstrate negative L dix-hallpike positional testing with reports of no dizziness to indicate resolution of L pBPPV.    Time  2    Period  Weeks    Status  New    Target Date  05/25/19             Plan - 05/04/19 1209    Clinical Impression Statement  Patient is a 73 yo female presenting to Neuro OPPT for vertigo. Her PMH is significant for: HTN, DLD, NICM, and anxiety. PT examination revealed upbeating and L rotatory nystagmus with L dix-hallpike test. Symptoms improved with repetitive Epley maneuver for L posterior  canal BPPV.  Patient may  benefit from skilled therapy services to address deficits and progress towards achieving functional goal.    Personal Factors and Comorbidities  Age;Comorbidity 3+;Sex    Comorbidities  HTN, DLD, NICM, anxiety,06/04/18: EF 45% but stable    Examination-Activity Limitations  Bed Mobility;Reach Overhead;Bend    Stability/Clinical Decision Making  Evolving/Moderate complexity    Clinical Decision Making  Moderate    Rehab Potential  Good    PT Frequency  1x / week    PT Duration  2 weeks    PT Treatment/Interventions  ADLs/Self Care Home Management;Gait training;Stair training;Functional mobility training;Therapeutic activities;Therapeutic exercise;Balance training;Patient/family education;Neuromuscular re-education;Vestibular;Canalith Repostioning    PT Next Visit Plan  Re-assess L pBPPV & treat as indicated. Initiate habituation and gaze stabilization exercises.    Consulted and Agree with Plan of Care  Patient       Patient will benefit from skilled therapeutic intervention in order to improve the following deficits and impairments:  Dizziness, Decreased balance, Difficulty walking  Visit Diagnosis: BPPV (benign paroxysmal positional vertigo), left  Dizziness and giddiness     Problem List Patient Active Problem List   Diagnosis Date Noted  . Sleep apnea   . GERD (gastroesophageal reflux disease)   . Anxiety   . NICM (nonischemic cardiomyopathy) ([place])   . Hypothyroidism   . Nonischemic cardiomyopathy ([place]) 06/04/2018  . Essential hypertension 06/04/2018  . Left bundle branch block 06/04/2018  . Dyslipidemia 06/04/2018    Juliann Pulse SPT 05/04/2019, 12:32 PM  [facility] [address] [place], [place], [address] Phone: [phone]   Fax:  [phone]  Name: Stefanie Camacho MRN: [id number] Date of Birth: 1945/11/21

## 2019-05-04 NOTE — Patient Instructions (Signed)
Sit to Side-Lying    Sit on edge of bed. 1. Turn head 45 to right. 2. Maintain head position and lie down slowly on left side. Hold until symptoms subside. 3. Sit up slowly. Hold until symptoms subside. 4. Turn head 45 to left. 5. Maintain head position and lie down slowly on right side. Hold until symptoms subside. 6. Sit up slowly. Repeat sequence _5_ times per session. Do __3__ sessions per day.      Self Treatment for Left Posterior / Anterior Canalithiasis    Sitting on bed: 1. Turn head 45 left. (a) Lie back slowly, shoulders on pillow, head on bed. (b) Hold _30__ seconds. 2. Keeping head on bed, turn head 90 right. Hold _30___ seconds. 3. Roll to right, head on 45 angle down toward bed. Hold _30___ seconds. 4. Sit up on right side of bed. Repeat __3__ times per session. Do _2___ sessions per day.  Copyright  VHI. All rights reserved.

## 2019-05-19 ENCOUNTER — Encounter: Payer: Medicare HMO | Admitting: Physical Therapy

## 2019-05-29 ENCOUNTER — Ambulatory Visit: Payer: Medicare HMO | Attending: Family Medicine | Admitting: Physical Therapy

## 2019-05-29 ENCOUNTER — Encounter: Payer: Self-pay | Admitting: Physical Therapy

## 2019-05-29 ENCOUNTER — Other Ambulatory Visit: Payer: Self-pay

## 2019-05-29 DIAGNOSIS — R42 Dizziness and giddiness: Secondary | ICD-10-CM

## 2019-05-29 DIAGNOSIS — H8112 Benign paroxysmal vertigo, left ear: Secondary | ICD-10-CM | POA: Diagnosis not present

## 2019-05-29 NOTE — Therapy (Signed)
[facility] [address] [place], [place], [address] Phone: [phone]   Fax:  [phone]  Physical Therapy Treatment & Recertification  Patient Details  Name: Stefanie Camacho MRN: [id number] Date of Birth: [birth year] Referring Provider (PT): Alben Spittle MD   Encounter Date: 05/29/2019  PT End of Session - 05/29/19 0802    Visit Number  2    Number of Visits  8    Date for PT Re-Evaluation  07/13/19    Authorization Type  Aetna Medicare    Authorization Time Period  --    PT Start Time  0804    PT Stop Time  0845    PT Time Calculation (min)  41 min    Activity Tolerance  Patient tolerated treatment well    Behavior During Therapy  [facility] for tasks assessed/performed       Past Medical History:  Diagnosis Date  . Anxiety   . Dyslipidemia   . Essential hypertension   . GERD (gastroesophageal reflux disease)   . Hypothyroidism   . NICM (nonischemic cardiomyopathy) ([place])   . Sleep apnea     Past Surgical History:  Procedure Laterality Date  . ABDOMINAL HYSTERECTOMY    . BLADDER NECK RECONSTRUCTION    . SHOULDER SURGERY    . TONSILLECTOMY      There were no vitals filed for this visit.  Subjective Assessment - 05/29/19 0801    Subjective  Pt states feeling the about the same of dizziness with getting up out of bed and looking up/down. Had 1 day without any symptoms. No nausea. It takes about 10-15 minutes to feel more steady. No problems with driving. Denies hearing loss or ringing in the ears. Has pain in L shoulder and numbness in bilateral hands if sleeping on side.    Pertinent History  HTN, DLD, NICM, anxiety1/29/20: EF 45% but stable    Limitations  Walking    Patient Stated Goals  get rid of dizziness    Currently in Pain?  No/denies             Vestibular Assessment - 05/29/19 0809      Oculomotor Exam   Oculomotor Alignment  Normal    Ocular ROM  WNL     Spontaneous  Absent     Gaze-induced   Absent    Smooth Pursuits  Intact    Saccades  Intact      Oculomotor Exam-Fixation Suppressed    Left Head Impulse  Negative    Right Head Impulse  Negative       Vestibulo-Ocular Reflex   VOR to Slow Head Movement  Normal    VOR Cancellation  Normal      Positional Testing   Dix-Hallpike  Dix-Hallpike Right;Dix-Hallpike Left    Horizontal Canal Testing  Horizontal Canal Right;Horizontal Canal Left      Dix-Hallpike Left   Dix-Hallpike Left Duration  <5s    Dix-Hallpike Left Symptoms  Upbeat, left rotatory nystagmus      Horizontal Canal Right   Horizontal Canal Right Duration  0    Horizontal Canal Right Symptoms  Normal      Horizontal Canal Left   Horizontal Canal Left Duration  0    Horizontal Canal Left Symptoms  Normal   pt reported mild sx when rolling               Vestibular Treatment/Exercise - 05/29/19 0816      Vestibular Treatment/Exercise  Vestibular Treatment Provided  Canalith Repositioning    Canalith Repositioning  Epley Manuever Left       EPLEY MANUEVER LEFT   Number of Reps   3    Overall Response   Improved Symptoms     RESPONSE DETAILS LEFT  With re-check, pt continued to demonstrate nystagmus that was longer lasting <10-15s. On last rep of Epley, used longer hold times of 45-60s in each position.            PT Education - 05/29/19 [phone]    Education Details  Therapist discussed if dizziness continues to come back to try to avoid sleeping on L side after treatments and stay in a more upright position. Discussed how variations in anatomy can cause prolonged symptoms requiring additional treatments. Discussed POC - 1x/week for additional 6 weeks, pt agreed.    Person(s) Educated  Patient    Methods  Explanation    Comprehension  Verbalized understanding       PT Short Term Goals - 05/29/19 1136      PT SHORT TERM GOAL #1   Title  Patient will report minimal to no dizziness with rolling in bed, bending over, and  general activities of daily living. (STG target date: 06/19/2019)    Time  3    Period  Weeks    Status  New    Target Date  06/19/19      PT SHORT TERM GOAL #2   Title  Patient tolerates initation of habituation exercises.    Time  3    Period  Weeks    Status  New    Target Date  06/19/19        PT Long Term Goals - 05/29/19 0830      PT LONG TERM GOAL #1   Title  Patient will demonstrate independence with habituation and balance HEP.    Time  2    Period  Weeks    Status  New    Target Date  05/25/19      PT LONG TERM GOAL #2   Title  Patient will demonstrate negative L dix-hallpike positional testing with reports of no dizziness to indicate resolution of L pBPPV.    Time  2    Period  Weeks    Status  New    Target Date  05/25/19          PT Long Term Goals - 05/29/19 0830      PT LONG TERM GOAL #1   Title  Patient will demonstrate independence with habituation and balance HEP. (all LTGs target date: 07/10/2019)    Time  6    Period  Weeks    Status  Deferred    Target Date  07/13/19      PT LONG TERM GOAL #2   Title  Patient will demonstrate negative L dix-hallpike positional testing with reports of no dizziness to indicate resolution of L pBPPV.    Time  6    Period  Weeks    Status  Deferred    Target Date  07/13/19          Plan - 05/29/19 0802    Clinical Impression Statement  Therapist performed 3 repetitions of the L Epley maneuver - pt continued to present to L rotary and upbeating nystagmus. Deferred LTGs as pt has had a large gap in care d/t a cancellation and difficulty scheduling. Pt agreed to continue care as BPPV has not resolved at  this time. Will conitnue to assess LpBPPV and treat as indicated and progress pt as appropriate. Pt may benefit from continued skilled therapy services to address deficits.    Personal Factors and Comorbidities  Age;Comorbidity 3+;Sex    Comorbidities  HTN, DLD, NICM, anxiety,06/04/18: EF 45% but stable     Examination-Activity Limitations  Bed Mobility;Reach Overhead;Bend    Stability/Clinical Decision Making  Evolving/Moderate complexity    Rehab Potential  Good    PT Frequency  1x / week    PT Duration  6 weeks    PT Treatment/Interventions  ADLs/Self Care Home Management;Gait training;Stair training;Functional mobility training;Therapeutic activities;Therapeutic exercise;Balance training;Patient/family education;Neuromuscular re-education;Vestibular;Canalith Repostioning    PT Next Visit Plan  Re-assess L pBPPV & treat as indicated. Initiate habituation and gaze stabilization exercises.    Consulted and Agree with Plan of Care  Patient       Patient will benefit from skilled therapeutic intervention in order to improve the following deficits and impairments:  Dizziness, Decreased balance, Difficulty walking  Visit Diagnosis: BPPV (benign paroxysmal positional vertigo), left  Dizziness and giddiness     Problem List Patient Active Problem List   Diagnosis Date Noted  . Sleep apnea   . GERD (gastroesophageal reflux disease)   . Anxiety   . NICM (nonischemic cardiomyopathy) ([place])   . Hypothyroidism   . Nonischemic cardiomyopathy ([place]) 06/04/2018  . Essential hypertension 06/04/2018  . Left bundle branch block 06/04/2018  . Dyslipidemia 06/04/2018    Juliann Pulse SPT 05/29/2019, 11:34 AM  [facility] [address] [place], [place], [address] Phone: [phone]   Fax:  [phone]  Name: Stefanie Camacho MRN: [id number] Date of Birth: June 23, 1945

## 2019-06-04 ENCOUNTER — Encounter: Payer: Medicare HMO | Admitting: Physical Therapy

## 2019-06-04 ENCOUNTER — Other Ambulatory Visit: Payer: Self-pay

## 2019-06-04 ENCOUNTER — Ambulatory Visit: Payer: Medicare HMO | Admitting: Physical Therapy

## 2019-06-04 DIAGNOSIS — R42 Dizziness and giddiness: Secondary | ICD-10-CM

## 2019-06-04 DIAGNOSIS — H8112 Benign paroxysmal vertigo, left ear: Secondary | ICD-10-CM | POA: Diagnosis not present

## 2019-06-04 NOTE — Therapy (Signed)
[facility] [address] [place], [place], [address] Phone: [phone]   Fax:  [phone]  Physical Therapy Treatment  Patient Details  Name: Stefanie Camacho MRN: [id number] Date of Birth: 1945/11/12 Referring Provider (PT): Alben Spittle MD   Encounter Date: 06/04/2019  PT End of Session - 06/04/19 1639    Visit Number  3    Number of Visits  8    Date for PT Re-Evaluation  07/13/19    Authorization Type  Aetna Medicare    PT Start Time  1962    PT Stop Time  1614    PT Time Calculation (min)  39 min    Activity Tolerance  Patient tolerated treatment well    Behavior During Therapy  [facility] for tasks assessed/performed       Past Medical History:  Diagnosis Date  . Anxiety   . Dyslipidemia   . Essential hypertension   . GERD (gastroesophageal reflux disease)   . Hypothyroidism   . NICM (nonischemic cardiomyopathy) ([place])   . Sleep apnea     Past Surgical History:  Procedure Laterality Date  . ABDOMINAL HYSTERECTOMY    . BLADDER NECK RECONSTRUCTION    . SHOULDER SURGERY    . TONSILLECTOMY      There were no vitals filed for this visit.  Subjective Assessment - 06/04/19 1538    Subjective  Feeling better this week but still having traces of dizziness; tried to sleep propped up and tried to avoid L side but did end up rolling to L due to shoulder pain.    Pertinent History  HTN, DLD, NICM, anxiety1/29/20: EF 45% but stable    Limitations  Walking    Patient Stated Goals  get rid of dizziness    Currently in Pain?  Yes    Pain Location  Shoulder    Pain Orientation  Left    Pain Descriptors / Indicators  Dull;Aching             Vestibular Assessment - 06/04/19 1540      Positional Testing   Dix-Hallpike  Dix-Hallpike Left;Dix-Hallpike Right      Dix-Hallpike Left   Dix-Hallpike Left Duration  10 seconds    Dix-Hallpike Left Symptoms  Upbeat, left rotatory nystagmus                 Vestibular Treatment/Exercise - 06/04/19 1555      Vestibular Treatment/Exercise   Vestibular Treatment Provided  Canalith Repositioning    Canalith Repositioning  Epley Manuever Left;[place] Procedure Left Posterior       EPLEY MANUEVER LEFT   Number of Reps   2    Overall Response   No change     RESPONSE DETAILS LEFT  pt reporting decreased symptoms but continues to present with nystagmus and vertigo in positional testing      [place] Procedure Left Posterior   Number of Reps   2    Overall Response  Improved Symptoms    Response Details   changed to Liberatory maneuver to treat canalithiasis due to pt not responding to CRM            PT Education - 06/04/19 1638    Education Details  Educated pt on plan for next visit    Person(s) Educated  Patient    Methods  Explanation    Comprehension  Verbalized understanding       PT Short Term Goals - 05/29/19 1136  PT SHORT TERM GOAL #1   Title  Patient will report minimal to no dizziness with rolling in bed, bending over, and general activities of daily living. (STG target date: 06/19/2019)    Time  3    Period  Weeks    Status  New    Target Date  06/19/19      PT SHORT TERM GOAL #2   Title  Patient tolerates initation of habituation exercises.    Time  3    Period  Weeks    Status  New    Target Date  06/19/19        PT Long Term Goals - 05/29/19 0830      PT LONG TERM GOAL #1   Title  Patient will demonstrate independence with habituation and balance HEP. (all LTGs target date: 07/10/2019)    Time  6    Period  Weeks    Status  Deferred    Target Date  07/13/19      PT LONG TERM GOAL #2   Title  Patient will demonstrate negative L dix-hallpike positional testing with reports of no dizziness to indicate resolution of L pBPPV.    Time  6    Period  Weeks    Status  Deferred    Target Date  07/13/19            Plan - 06/04/19 1641    Clinical Impression Statement  Pt  continues to present with symptoms of positional vertigo -unchanged today after two more repetitions of CRM.  Changed to liberatory maneuver x 2 reps.  Will continue to assess patient's response to treatment and utilize most effective manuever to mitigate symptoms and decrease falls risk.    Personal Factors and Comorbidities  Age;Comorbidity 3+;Sex    Comorbidities  HTN, DLD, NICM, anxiety,06/04/18: EF 45% but stable    Examination-Activity Limitations  Bed Mobility;Reach Overhead;Bend    Stability/Clinical Decision Making  Evolving/Moderate complexity    Rehab Potential  Good    PT Frequency  1x / week    PT Duration  6 weeks    PT Treatment/Interventions  ADLs/Self Care Home Management;Gait training;Stair training;Functional mobility training;Therapeutic activities;Therapeutic exercise;Balance training;Patient/family education;Neuromuscular re-education;Vestibular;Canalith Repostioning    PT Next Visit Plan  Re-assess L pBPPV & treat with liberatory maneuver and then possibly deep head hang.    Consulted and Agree with Plan of Care  Patient       Patient will benefit from skilled therapeutic intervention in order to improve the following deficits and impairments:  Dizziness, Decreased balance, Difficulty walking  Visit Diagnosis: BPPV (benign paroxysmal positional vertigo), left  Dizziness and giddiness     Problem List Patient Active Problem List   Diagnosis Date Noted  . Sleep apnea   . GERD (gastroesophageal reflux disease)   . Anxiety   . NICM (nonischemic cardiomyopathy) ([place])   . Hypothyroidism   . Nonischemic cardiomyopathy ([place]) 06/04/2018  . Essential hypertension 06/04/2018  . Left bundle branch block 06/04/2018  . Dyslipidemia 06/04/2018   Stefanie Camacho, PT, DPT 06/04/19    4:44 PM    [facility] [address] [place], [place], [address] Phone: [phone]   Fax:  [phone]  Name: Stefanie Camacho MRN: [id number] Date of Birth: [birth year]

## 2019-06-10 DIAGNOSIS — H2513 Age-related nuclear cataract, bilateral: Secondary | ICD-10-CM | POA: Diagnosis not present

## 2019-06-10 DIAGNOSIS — H04123 Dry eye syndrome of bilateral lacrimal glands: Secondary | ICD-10-CM | POA: Diagnosis not present

## 2019-06-10 DIAGNOSIS — H5213 Myopia, bilateral: Secondary | ICD-10-CM | POA: Diagnosis not present

## 2019-06-11 ENCOUNTER — Other Ambulatory Visit: Payer: Self-pay

## 2019-06-11 ENCOUNTER — Ambulatory Visit: Payer: Medicare HMO | Attending: Family Medicine | Admitting: Physical Therapy

## 2019-06-11 ENCOUNTER — Encounter: Payer: Self-pay | Admitting: Physical Therapy

## 2019-06-11 DIAGNOSIS — R42 Dizziness and giddiness: Secondary | ICD-10-CM | POA: Insufficient documentation

## 2019-06-11 DIAGNOSIS — H8112 Benign paroxysmal vertigo, left ear: Secondary | ICD-10-CM

## 2019-06-11 NOTE — Therapy (Signed)
[facility] [address] [place], [place], [address] Phone: [phone]   Fax:  [phone]  Physical Therapy Treatment  Patient Details  Name: Stefanie Camacho MRN: [id number] Date of Birth: 12-06-1945 Referring Provider (PT): Alben Spittle MD   Encounter Date: 06/11/2019  PT End of Session - 06/11/19 0921    Visit Number  4    Number of Visits  8    Date for PT Re-Evaluation  07/13/19    Authorization Type  [employer]    PT Start Time  [phone]    PT Stop Time  0920   did not need full time   PT Time Calculation (min)  30 min    Activity Tolerance  Patient tolerated treatment well    Behavior During Therapy  [facility] for tasks assessed/performed       Past Medical History:  Diagnosis Date  . Anxiety   . Dyslipidemia   . Essential hypertension   . GERD (gastroesophageal reflux disease)   . Hypothyroidism   . NICM (nonischemic cardiomyopathy) ([place])   . Sleep apnea     Past Surgical History:  Procedure Laterality Date  . ABDOMINAL HYSTERECTOMY    . BLADDER NECK RECONSTRUCTION    . SHOULDER SURGERY    . TONSILLECTOMY      There were no vitals filed for this visit.  Subjective Assessment - 06/11/19 0851    Subjective  Has been feeling even better than before - no dizziness or lightheadedness but a couple of episodes of feeling off balance.  Wanted to check and see if the eyes were still moving when she lies down.    Pertinent History  HTN, DLD, NICM, anxiety1/29/20: EF 45% but stable    Limitations  Walking    Patient Stated Goals  get rid of dizziness    Currently in Pain?  No/denies             Vestibular Assessment - 06/11/19 0854      Other Tests   Comments  Deep head hang after hallpike-dix with 5-10 seconds of downbeating nystagmus      Positional Testing   Dix-Hallpike  Dix-Hallpike Left   Deep head hang     Dix-Hallpike Left   Dix-Hallpike Left Duration  10 seconds    Dix-Hallpike  Left Symptoms  No nystagmus;Downbeat Nystagmus                Vestibular Treatment/Exercise - 06/11/19 0902      Vestibular Treatment/Exercise   Vestibular Treatment Provided  Canalith Repositioning;Gaze    Canalith Repositioning  Comment    Habituation Exercises  Comment    Gaze Exercises  X1 Viewing Horizontal;X1 Viewing Vertical      OTHER   Comment  Performed deep head hanging procedure x 2 to address ventral arm of posterior canal.  No increase in symptoms and no reports of vertigo.  Also performed habituation with repeated sit <> stand with eyes focused on stable target - pt reported no dizziness but reported a sense of sway in her balance      X1 Viewing Horizontal   Foot Position  feet apart, feet together    Reps  2    Comments  30 seconds, more sway with feet together      X1 Viewing Vertical   Foot Position  feet apart, feet together    Reps  2    Comments  30 seconds, more sway with feet together  PT Education - 06/11/19 0921    Education Details  x1 viewing for balance and VOR training    Person(s) Educated  Patient    Methods  Explanation;Demonstration;Handout    Comprehension  Verbalized understanding;Returned demonstration       PT Short Term Goals - 06/11/19 0904      PT SHORT TERM GOAL #1   Title  Patient will report minimal to no dizziness with rolling in bed, bending over, and general activities of daily living. (STG target date: 06/19/2019)    Time  3    Period  Weeks    Status  Achieved    Target Date  06/19/19      PT SHORT TERM GOAL #2   Title  Patient tolerates initation of habituation exercises.    Time  3    Period  Weeks    Status  Achieved    Target Date  06/19/19        PT Long Term Goals - 06/11/19 0904      PT LONG TERM GOAL #1   Title  Patient will demonstrate independence with habituation and balance HEP. (all LTGs target date: 07/10/2019)    Time  6    Period  Weeks    Status  Deferred      PT LONG  TERM GOAL #2   Title  Patient will demonstrate negative L dix-hallpike positional testing with reports of no dizziness to indicate resolution of L pBPPV.    Time  6    Period  Weeks    Status  Achieved            Plan - 06/11/19 3570    Clinical Impression Statement  Pt is making fast progress towards goals.  Pt responded well to Semont procedure with resolution of posterior canal BPPV; pt still presented with low amplitude downbeating nystagmus - treated x 2 with deep head hanging manuever.  Initiated standing balance and VOR training with narrow BOS.  Pt has met both STG and may achieve LTG early and be ready for D/C early.    Personal Factors and Comorbidities  Age;Comorbidity 3+;Sex    Comorbidities  HTN, DLD, NICM, anxiety,06/04/18: EF 45% but stable    Examination-Activity Limitations  Bed Mobility;Reach Overhead;Bend    Stability/Clinical Decision Making  Evolving/Moderate complexity    Rehab Potential  Good    PT Frequency  1x / week    PT Duration  6 weeks    PT Treatment/Interventions  ADLs/Self Care Home Management;Gait training;Stair training;Functional mobility training;Therapeutic activities;Therapeutic exercise;Balance training;Patient/family education;Neuromuscular re-education;Vestibular;Canalith Repostioning    PT Next Visit Plan  If L BPPV returns pt responds best to semont manuever; if downbeating - repeat deep head hanging maneuver.  Progress x1 viewing - may be ready for D/C if symptoms continue to be resolved.    Consulted and Agree with Plan of Care  Patient       Patient will benefit from skilled therapeutic intervention in order to improve the following deficits and impairments:  Dizziness, Decreased balance, Difficulty walking  Visit Diagnosis: BPPV (benign paroxysmal positional vertigo), left  Dizziness and giddiness     Problem List Patient Active Problem List   Diagnosis Date Noted  . Sleep apnea   . GERD (gastroesophageal reflux disease)   .  Anxiety   . NICM (nonischemic cardiomyopathy) ([place])   . Hypothyroidism   . Nonischemic cardiomyopathy ([place]) 06/04/2018  . Essential hypertension 06/04/2018  . Left bundle branch block 06/04/2018  .  Dyslipidemia 06/04/2018    Rico Junker, PT, DPT 06/11/19    9:27 AM    [facility] [address] [place], [place], [address] Phone: [phone]   Fax:  [phone]  Name: Stefanie Camacho MRN: [id number] Date of Birth: 30-Nov-1945

## 2019-06-11 NOTE — Patient Instructions (Addendum)
  Gaze Stabilization: Standing Feet Together    Feet together, keeping eyes on target on wall __3__ feet away, tilt head down 15-30 and move head side to side for _30___ seconds. Repeat while moving head up and down for _30___ seconds. Do __2__ sessions per day.

## 2019-06-15 ENCOUNTER — Ambulatory Visit: Payer: Medicare HMO | Attending: Internal Medicine

## 2019-06-15 DIAGNOSIS — Z23 Encounter for immunization: Secondary | ICD-10-CM | POA: Insufficient documentation

## 2019-06-15 NOTE — Progress Notes (Signed)
   Covid-19 Vaccination Clinic  Name:  Stefanie Camacho    MRN: [id number] DOB: 04/21/1946  06/15/2019  [name] was observed post Covid-19 immunization for 15 minutes without incidence. She was provided with Vaccine Information Sheet and instruction to access the V-Safe system.   [name] was instructed to call 911 with any severe reactions post vaccine: Marland Kitchen Difficulty breathing  . Swelling of your face and throat  . A fast heartbeat  . A bad rash all over your body  . Dizziness and weakness    Immunizations Administered    Name Date Dose VIS Date Route   Pfizer COVID-19 Vaccine 06/15/2019  2:50 PM 0.3 mL 04/17/2019 Intramuscular   Manufacturer: [place]   Lot: [id number]   [place]: [id number]

## 2019-06-18 ENCOUNTER — Ambulatory Visit: Payer: Medicare HMO | Admitting: Physical Therapy

## 2019-06-25 ENCOUNTER — Ambulatory Visit: Payer: Medicare HMO | Admitting: Physical Therapy

## 2019-06-29 ENCOUNTER — Encounter: Payer: Self-pay | Admitting: Cardiology

## 2019-06-29 ENCOUNTER — Other Ambulatory Visit: Payer: Self-pay

## 2019-06-29 ENCOUNTER — Ambulatory Visit: Payer: Medicare HMO | Admitting: Cardiology

## 2019-06-29 VITALS — BP 112/68 | HR 76 | Ht 66.0 in | Wt 200.1 lb

## 2019-06-29 DIAGNOSIS — I428 Other cardiomyopathies: Secondary | ICD-10-CM | POA: Diagnosis not present

## 2019-06-29 DIAGNOSIS — E785 Hyperlipidemia, unspecified: Secondary | ICD-10-CM | POA: Diagnosis not present

## 2019-06-29 DIAGNOSIS — I447 Left bundle-branch block, unspecified: Secondary | ICD-10-CM | POA: Diagnosis not present

## 2019-06-29 DIAGNOSIS — I10 Essential (primary) hypertension: Secondary | ICD-10-CM

## 2019-06-29 DIAGNOSIS — R42 Dizziness and giddiness: Secondary | ICD-10-CM | POA: Diagnosis not present

## 2019-06-29 NOTE — Addendum Note (Signed)
Addended by: Linna Hoff R on: 06/29/2019 12:00 PM   Modules accepted: Orders

## 2019-06-29 NOTE — Progress Notes (Signed)
Cardiology Office Note:    Date:  06/29/2019   ID:  Stefanie Camacho, DOB May 19, 1945, MRN [id number]  PCP:  [name], MD  Cardiologist:  Jenne Campus, MD    Referring MD: [name], MD   No chief complaint on file. Have been having worsening of my vertigo  History of Present Illness:    Stefanie Camacho is a 74 y.o. female with past medical history significant for a left bundle branch block, cardiomyopathy which is nonischemic in origin with ejection fraction 45%, dyslipidemia, vertigo.  Comes today to my office for follow-up.  Overall she described to have episode of vertigo which is much worse than before.  Usually it lasts for about 1 week and then goes away but this time it staying over there for a few weeks already.  What she describes as vertigo is dizziness when she moves her head very quickly.  When she turns in the bed from side to side to get up very quickly will happen.  She eventually started having some exercises done to help with the vertigo which seems to be helping quite well.  But still she is very concerned about potentially having some additional problem.  Today's EKG showed left bundle branch block which is chronic, sinus rhythm, some frequent PVCs in form of bigeminy.  Of course that make me worried that she may have some arrhythmia undergoing as well.  She denies have any passing out.  No chest pain tightness squeezing pressure burning chest no shortness of breath.  Past Medical History:  Diagnosis Date  . Anxiety   . Dyslipidemia   . Essential hypertension   . GERD (gastroesophageal reflux disease)   . Hypothyroidism   . NICM (nonischemic cardiomyopathy) ([place])   . Sleep apnea     Past Surgical History:  Procedure Laterality Date  . ABDOMINAL HYSTERECTOMY    . BLADDER NECK RECONSTRUCTION    . SHOULDER SURGERY    . TONSILLECTOMY      Current Medications: Current Meds  Medication Sig  . aspirin EC 81 MG tablet Take 81 mg by mouth daily.  Marland Kitchen  atorvastatin (LIPITOR) 10 MG tablet TAKE 1 TABLET (10 MG TOTAL) BY MOUTH DAILY AT 6 PM.  . citalopram (CELEXA) 10 MG tablet Take 5 mg by mouth every other day.   . levothyroxine (SYNTHROID, LEVOTHROID) 25 MCG tablet Take 1 tablet by mouth daily. Pt takes 1 and 1/2 daily  . losartan (COZAAR) 50 MG tablet Take 1 tablet (50 mg total) by mouth daily.  . metoprolol succinate (TOPROL-XL) 50 MG 24 hr tablet TAKE 1 TABLET BY MOUTH EVERY DAY  . omeprazole (PRILOSEC) 20 MG capsule Take 20 mg by mouth daily.  Marland Kitchen spironolactone (ALDACTONE) 25 MG tablet TAKE 1/2 TABLET BY MOUTH EVERY DAY     Allergies:   Patient has no known allergies.   Social History   Socioeconomic History  . Marital status: Divorced    Spouse name: Not on file  . Number of children: Not on file  . Years of education: Not on file  . Highest education level: Not on file  Occupational History  . Not on file  Tobacco Use  . Smoking status: Never Smoker  . Smokeless tobacco: Never Used  Substance and Sexual Activity  . Alcohol use: Not Currently  . Drug use: Never  . Sexual activity: Not on file  Other Topics Concern  . Not on file  Social History Narrative  . Not on file   Social  Determinants of Health   Financial Resource Strain:   . Difficulty of Paying Living Expenses: Not on file  Food Insecurity:   . Worried About Charity fundraiser in the Last Year: Not on file  . Ran Out of Food in the Last Year: Not on file  Transportation Needs:   . Lack of Transportation (Medical): Not on file  . Lack of Transportation (Non-Medical): Not on file  Physical Activity:   . Days of Exercise per Week: Not on file  . Minutes of Exercise per Session: Not on file  Stress:   . Feeling of Stress : Not on file  Social Connections:   . Frequency of Communication with Friends and Family: Not on file  . Frequency of Social Gatherings with Friends and Family: Not on file  . Attends Religious Services: Not on file  . Active Member of  Clubs or Organizations: Not on file  . Attends Archivist Meetings: Not on file  . Marital Status: Not on file     Family History: The patient's family history includes Heart disease in her father, maternal grandfather, mother, and paternal grandfather; Stroke in her father and mother. ROS:   Please see the history of present illness.    All 14 point review of systems negative except as described per history of present illness  EKGs/Labs/Other Studies Reviewed:      Recent Labs: 07/17/2018: ALT 28  Recent Lipid Panel    Component Value Date/Time   CHOL 133 07/17/2018 0933   TRIG 130 07/17/2018 0933   HDL 40 07/17/2018 0933   CHOLHDL 3.3 07/17/2018 0933   LDLCALC 67 07/17/2018 0933    Physical Exam:    VS:  BP 112/68   Pulse 76   Ht 5\' 6"  (1.676 m)   Wt 200 lb 1.9 oz (90.8 kg)   SpO2 95%   BMI 32.30 kg/m     Wt Readings from Last 3 Encounters:  06/29/19 200 lb 1.9 oz (90.8 kg)  12/29/18 200 lb (90.7 kg)  06/04/18 198 lb 6.4 oz (90 kg)     GEN:  Well nourished, well developed in no acute distress HEENT: Normal NECK: No JVD; No carotid bruits LYMPHATICS: No lymphadenopathy CARDIAC: RRR, no murmurs, no rubs, no gallops RESPIRATORY:  Clear to auscultation without rales, wheezing or rhonchi  ABDOMEN: Soft, non-tender, non-distended MUSCULOSKELETAL:  No edema; No deformity  SKIN: Warm and dry LOWER EXTREMITIES: no swelling NEUROLOGIC:  Alert and oriented x 3 PSYCHIATRIC:  Normal affect   ASSESSMENT:    1. Nonischemic cardiomyopathy ([place])   2. Left bundle branch block   3. Essential hypertension   4. Dyslipidemia    PLAN:    In order of problems listed above:  1. Nonischemic cardiomyopathy I will ask you to have another echocardiogram done to check left ventricle ejection fraction. 2. Left bundle branch block.  Noted, chronic.  Continue present management.  Will do echocardiogram. 3. Essential hypertension blood pressure appears to be well  controlled.  Some medication had to be cut down because of low blood pressure. 4. Dyslipidemia.  I see last lipid profile from March 2020 which show LDL of 67 and HDL of 40 this is an excellent cholesterol profile.  We will continue present management. 5. Ventricular ectopy noted on the EKG in somebody with left bundle branch block and dizziness.  I will schedule her to have event recorder for a week.   Medication Adjustments/Labs and Tests Ordered: Current medicines are  reviewed at length with the patient today.  Concerns regarding medicines are outlined above.  No orders of the defined types were placed in this encounter.  Medication changes: No orders of the defined types were placed in this encounter.   Signed, Park Liter, MD, [place] 06/29/2019 11:50 AM    [place]

## 2019-06-29 NOTE — Patient Instructions (Signed)
Medication Instructions:  Your physician recommends that you continue on your current medications as directed. Please refer to the Current Medication list given to you today.  *If you need a refill on your cardiac medications before your next appointment, please call your pharmacy*  Lab Work: None.  If you have labs (blood work) drawn today and your tests are completely normal, you will receive your results only by: Marland Kitchen MyChart Message (if you have MyChart) OR . A paper copy in the mail If you have any lab test that is abnormal or we need to change your treatment, we will call you to review the results.  Testing/Procedures: Your physician has requested that you have an echocardiogram. Echocardiography is a painless test that uses sound waves to create images of your heart. It provides your doctor with information about the size and shape of your heart and how well your heart's chambers and valves are working. This procedure takes approximately one hour. There are no restrictions for this procedure.  A zio monitor was ordered today. It will remain on for 7 days. You will then return monitor and event diary in provided box. It takes 1-2 weeks for report to be downloaded and returned to [place]. We will call you with the results. If monitor falls off or has orange flashing light, please call Zio for further instructions.     Follow-Up: At [facility], you and your health needs are our priority.  As part of our continuing mission to provide you with exceptional heart care, we have created designated Provider Care Teams.  These Care Teams include your primary Cardiologist (physician) and Advanced Practice Providers (APPs -  Physician Assistants and Nurse Practitioners) who all work together to provide you with the care you need, when you need it.  Your next appointment:   3 month(s)  The format for your next appointment:   In Person  Provider:   You may see Jenne Campus, MD or the following  Advanced Practice Provider on your designated Care Team:    Laurann [place], FNP   Other Instructions   Echocardiogram An echocardiogram is a procedure that uses painless sound waves (ultrasound) to produce an image of the heart. Images from an echocardiogram can provide important information about:  Signs of coronary artery disease (CAD).  Aneurysm detection. An aneurysm is a weak or damaged part of an artery wall that bulges out from the normal force of blood pumping through the body.  Heart size and shape. Changes in the size or shape of the heart can be associated with certain conditions, including heart failure, aneurysm, and CAD.  Heart muscle function.  Heart valve function.  Signs of a past heart attack.  Fluid buildup around the heart.  Thickening of the heart muscle.  A tumor or infectious growth around the heart valves. Tell a health care provider about:  Any allergies you have.  All medicines you are taking, including vitamins, herbs, eye drops, creams, and over-the-counter medicines.  Any blood disorders you have.  Any surgeries you have had.  Any medical conditions you have.  Whether you are pregnant or may be pregnant. What are the risks? Generally, this is a safe procedure. However, problems may occur, including:  Allergic reaction to dye (contrast) that may be used during the procedure. What happens before the procedure? No specific preparation is needed. You may eat and drink normally. What happens during the procedure?   An IV tube may be inserted into one of your veins.  You may receive contrast through this tube. A contrast is an injection that improves the quality of the pictures from your heart.  A gel will be applied to your chest.  A wand-like tool (transducer) will be moved over your chest. The gel will help to transmit the sound waves from the transducer.  The sound waves will harmlessly bounce off of your heart to allow the heart  images to be captured in real-time motion. The images will be recorded on a computer. The procedure may vary among health care providers and hospitals. What happens after the procedure?  You may return to your normal, everyday life, including diet, activities, and medicines, unless your health care provider tells you not to do that. Summary  An echocardiogram is a procedure that uses painless sound waves (ultrasound) to produce an image of the heart.  Images from an echocardiogram can provide important information about the size and shape of your heart, heart muscle function, heart valve function, and fluid buildup around your heart.  You do not need to do anything to prepare before this procedure. You may eat and drink normally.  After the echocardiogram is completed, you may return to your normal, everyday life, unless your health care provider tells you not to do that. This information is not intended to replace advice given to you by your health care provider. Make sure you discuss any questions you have with your health care provider. Document Revised: 08/14/2018 Document Reviewed: 05/26/2016 Elsevier Patient Education  [place].

## 2019-07-02 ENCOUNTER — Other Ambulatory Visit: Payer: Self-pay

## 2019-07-02 ENCOUNTER — Encounter: Payer: Self-pay | Admitting: Physical Therapy

## 2019-07-02 ENCOUNTER — Ambulatory Visit: Payer: Medicare HMO | Admitting: Physical Therapy

## 2019-07-02 DIAGNOSIS — R42 Dizziness and giddiness: Secondary | ICD-10-CM

## 2019-07-02 DIAGNOSIS — H8112 Benign paroxysmal vertigo, left ear: Secondary | ICD-10-CM

## 2019-07-02 NOTE — Therapy (Signed)
[facility] [address] [place], [place], [address] Phone: [phone]   Fax:  [phone]  Physical Therapy Treatment  Patient Details  Name: Stefanie Camacho MRN: [id number] Date of Birth: 1945-08-12 Referring Provider (PT): Alben Spittle MD   Encounter Date: 07/02/2019  PT End of Session - 07/02/19 1305    Visit Number  5    Number of Visits  8    Date for PT Re-Evaluation  07/13/19    Authorization Type  [employer]na Medicare    PT Start Time  [phone]    PT Stop Time  1020    PT Time Calculation (min)  42 min    Activity Tolerance  Patient tolerated treatment well    Behavior During Therapy  [facility] for tasks assessed/performed       Past Medical History:  Diagnosis Date  . Anxiety   . Dyslipidemia   . Essential hypertension   . GERD (gastroesophageal reflux disease)   . Hypothyroidism   . NICM (nonischemic cardiomyopathy) ([place])   . Sleep apnea     Past Surgical History:  Procedure Laterality Date  . ABDOMINAL HYSTERECTOMY    . BLADDER NECK RECONSTRUCTION    . SHOULDER SURGERY    . TONSILLECTOMY      There were no vitals filed for this visit.  Subjective Assessment - 07/02/19 0940    Subjective  Had appointment with cardiology - they are going to do an echo and put her on a heart monitor next week to check for any cardiac causes of lightheadedness.  Lightheadedness is better but had two episodes of quick vertigo last night and the night before rolling to the L.    Pertinent History  HTN, DLD, NICM, anxiety1/29/20: EF 45% but stable    Limitations  Walking    Patient Stated Goals  get rid of dizziness    Currently in Pain?  No/denies             Vestibular Assessment - 07/02/19 0942      Horizontal Canal Right   Horizontal Canal Right Duration  >30 seconds    Horizontal Canal Right Symptoms  Ageotrophic      Horizontal Canal Left   Horizontal Canal Left Duration  60 seconds    Horizontal Canal  Left Symptoms  Ageotrophic                Vestibular Treatment/Exercise - 07/02/19 0947      Vestibular Treatment/Exercise   Vestibular Treatment Provided  Canalith Repositioning    Canalith Repositioning  Comment   Kim Maneuver     OTHER   Comment  Kim Maneuver to the L side for L horizontal canal cupulolithiasis x 2 reps with no change in symptoms            PT Education - 07/02/19 1305    Education Details  horizontal canal cupulolithiasis, add 2 more visits to address    Person(s) Educated  Patient    Methods  Explanation    Comprehension  Verbalized understanding       PT Short Term Goals - 06/11/19 0904      PT SHORT TERM GOAL #1   Title  Patient will report minimal to no dizziness with rolling in bed, bending over, and general activities of daily living. (STG target date: 06/19/2019)    Time  3    Period  Weeks    Status  Achieved    Target Date  06/19/19  PT SHORT TERM GOAL #2   Title  Patient tolerates initation of habituation exercises.    Time  3    Period  Weeks    Status  Achieved    Target Date  06/19/19        PT Long Term Goals - 07/02/19 1308      PT LONG TERM GOAL #1   Title  Patient will demonstrate independence with habituation and balance HEP. (all LTGs target date: 07/10/2019)    Time  6    Period  Weeks    Status  On-going    Target Date  07/13/19      PT LONG TERM GOAL #2   Title  Patient will demonstrate negative L dix-hallpike positional testing with reports of no dizziness to indicate resolution of L pBPPV.    Baseline  horizontal canal cupulolithiasis    Time  6    Period  Weeks    Status  On-going            Plan - 07/02/19 1306    Clinical Impression Statement  Pt reporting improvement in lightheadedness but demonstrates a return of BPPV - based on assessment pt appears to have horizontal canal cupulolithiasis but laterality difficult to determine.  Treated L side with Stephani Police today; if no change next  session will attempt to perform for R side (R side less symptomatic).  Will also continue to progress vestibular HEP.    Personal Factors and Comorbidities  Age;Comorbidity 3+;Sex    Comorbidities  HTN, DLD, NICM, anxiety,06/04/18: EF 45% but stable    Examination-Activity Limitations  Bed Mobility;Reach Overhead;Bend    Stability/Clinical Decision Making  Evolving/Moderate complexity    Rehab Potential  Good    PT Frequency  1x / week    PT Duration  6 weeks    PT Treatment/Interventions  ADLs/Self Care Home Management;Gait training;Stair training;Functional mobility training;Therapeutic activities;Therapeutic exercise;Balance training;Patient/family education;Neuromuscular re-education;Vestibular;Canalith Repostioning    PT Next Visit Plan  Recheck for horizontal canal cupulo - if still there, treat R with Stephani Police.  Progress x1 viewing    Consulted and Agree with Plan of Care  Patient       Patient will benefit from skilled therapeutic intervention in order to improve the following deficits and impairments:  Dizziness, Decreased balance, Difficulty walking  Visit Diagnosis: BPPV (benign paroxysmal positional vertigo), left  Dizziness and giddiness     Problem List Patient Active Problem List   Diagnosis Date Noted  . Sleep apnea   . GERD (gastroesophageal reflux disease)   . Anxiety   . NICM (nonischemic cardiomyopathy) ([place])   . Hypothyroidism   . Nonischemic cardiomyopathy ([place]) 06/04/2018  . Essential hypertension 06/04/2018  . Left bundle branch block 06/04/2018  . Dyslipidemia 06/04/2018    Rico Junker, PT, DPT 07/02/19    1:09 PM    [facility] [address] [place], [place], [address] Phone: [phone]   Fax:  [phone]  Name: Stefanie Camacho MRN: [id number] Date of Birth: [birth year]

## 2019-07-04 ENCOUNTER — Other Ambulatory Visit: Payer: Self-pay | Admitting: Family

## 2019-07-04 DIAGNOSIS — I10 Essential (primary) hypertension: Secondary | ICD-10-CM

## 2019-07-07 ENCOUNTER — Other Ambulatory Visit: Payer: Self-pay

## 2019-07-07 ENCOUNTER — Ambulatory Visit (HOSPITAL_BASED_OUTPATIENT_CLINIC_OR_DEPARTMENT_OTHER)
Admission: RE | Admit: 2019-07-07 | Discharge: 2019-07-07 | Disposition: A | Discharge status: Home / Self Care | Payer: Medicare HMO | Source: Ambulatory Visit | Attending: Cardiology | Admitting: Cardiology

## 2019-07-07 DIAGNOSIS — I428 Other cardiomyopathies: Secondary | ICD-10-CM

## 2019-07-07 DIAGNOSIS — I447 Left bundle-branch block, unspecified: Secondary | ICD-10-CM | POA: Diagnosis not present

## 2019-07-07 NOTE — Progress Notes (Signed)
  Echocardiogram 2D Echocardiogram has been performed.  Cardell Peach 07/07/2019, 2:55 PM

## 2019-07-08 ENCOUNTER — Ambulatory Visit (INDEPENDENT_AMBULATORY_CARE_PROVIDER_SITE_OTHER): Payer: Medicare HMO

## 2019-07-08 DIAGNOSIS — R42 Dizziness and giddiness: Secondary | ICD-10-CM

## 2019-07-09 ENCOUNTER — Encounter: Payer: Self-pay | Admitting: Physical Therapy

## 2019-07-09 ENCOUNTER — Ambulatory Visit: Payer: Medicare HMO | Attending: Family Medicine | Admitting: Physical Therapy

## 2019-07-09 ENCOUNTER — Other Ambulatory Visit: Payer: Self-pay

## 2019-07-09 VITALS — BP 120/65

## 2019-07-09 DIAGNOSIS — H8112 Benign paroxysmal vertigo, left ear: Secondary | ICD-10-CM | POA: Diagnosis not present

## 2019-07-09 DIAGNOSIS — R42 Dizziness and giddiness: Secondary | ICD-10-CM | POA: Insufficient documentation

## 2019-07-09 DIAGNOSIS — H8111 Benign paroxysmal vertigo, right ear: Secondary | ICD-10-CM | POA: Diagnosis not present

## 2019-07-09 NOTE — Therapy (Signed)
[facility] [address] [place], [place], [address] Phone: [phone]   Fax:  [phone]  Physical Therapy Treatment  Patient Details  Name: Stefanie Camacho MRN: [id number] Date of Birth: February 18, 1946 Referring Provider (PT): Alben Spittle MD   Encounter Date: 07/09/2019  PT End of Session - 07/09/19 1204    Visit Number  6    Number of Visits  8    Date for PT Re-Evaluation  07/13/19    Authorization Type  [employer]etna Medicare    PT Start Time  1025    PT Stop Time  1105    PT Time Calculation (min)  40 min    Activity Tolerance  Other (comment)   limited by dizziness   Behavior During Therapy  [facility] for tasks assessed/performed       Past Medical History:  Diagnosis Date  . Anxiety   . Dyslipidemia   . Essential hypertension   . GERD (gastroesophageal reflux disease)   . Hypothyroidism   . NICM (nonischemic cardiomyopathy) ([place])   . Sleep apnea     Past Surgical History:  Procedure Laterality Date  . ABDOMINAL HYSTERECTOMY    . BLADDER NECK RECONSTRUCTION    . SHOULDER SURGERY    . TONSILLECTOMY      Vitals:   07/09/19 1028  BP: 120/65    Subjective Assessment - 07/09/19 1027    Subjective  Echo was unchanged from previous.  Wearing heart monitor.  Feeling washed out/lightheaded today.  Still having spinning with rolling over L and R.    Pertinent History  HTN, DLD, NICM, anxiety1/29/20: EF 45% but stable    Limitations  Walking    Patient Stated Goals  get rid of dizziness    Currently in Pain?  No/denies             Vestibular Assessment - 07/09/19 1031      Horizontal Canal Right   Horizontal Canal Right Duration  >60 seconds; third assessment started to the R - no nystagmus and no vertigo    Horizontal Canal Right Symptoms  Ageotrophic   less symptomatic on R side     Horizontal Canal Left   Horizontal Canal Left Duration  60 seconds; 2nd assessment nystagmus changed to severe  geotrophic and pt became nauseous.    Horizontal Canal Left Symptoms  Ageotrophic;Geotrophic   more symptomatic L side               Vestibular Treatment/Exercise - 07/09/19 1046      Vestibular Treatment/Exercise   Vestibular Treatment Provided  Canalith Repositioning    Canalith Repositioning  Comment;Canal Roll Right      Canal Roll Right   Number of Reps   1    Overall Response   Symptoms Resolved    Response Details   started to perform canal roll R to treat canalithiasis after treating cupulolithiasis but symptoms resolved and pt did not require full manuever      OTHER   Comment  No change from last session with nystagmus and symptoms.  Performed Stephani Police for horizontal canal cupulolithiasis on R side (R side less symptomatic).             PT Education - 07/09/19 1203    Education Details  R horizontal canal cupulolithiasis, added visit tomorrow to address if symptoms persist into tomorrow    Person(s) Educated  Patient    Methods  Explanation    Comprehension  Verbalized understanding  PT Short Term Goals - 06/11/19 0904      PT SHORT TERM GOAL #1   Title  Patient will report minimal to no dizziness with rolling in bed, bending over, and general activities of daily living. (STG target date: 06/19/2019)    Time  3    Period  Weeks    Status  Achieved    Target Date  06/19/19      PT SHORT TERM GOAL #2   Title  Patient tolerates initation of habituation exercises.    Time  3    Period  Weeks    Status  Achieved    Target Date  06/19/19        PT Long Term Goals - 07/02/19 1308      PT LONG TERM GOAL #1   Title  Patient will demonstrate independence with habituation and balance HEP. (all LTGs target date: 07/10/2019)    Time  6    Period  Weeks    Status  On-going    Target Date  07/13/19      PT LONG TERM GOAL #2   Title  Patient will demonstrate negative L dix-hallpike positional testing with reports of no dizziness to indicate  resolution of L pBPPV.    Baseline  horizontal canal cupulolithiasis    Time  6    Period  Weeks    Status  On-going            Plan - 07/09/19 1205    Clinical Impression Statement  Due to no change in symptoms from last week altered treatment to address R horizontal canal (less symptomatic side); performed KIM manuever with vibration x1 and with reassessment pt demonstrated conversion to geotrophic nystagmus.  Attempted to perform barbeque roll for R side but symptoms had resolved after second assessment (roll to L).  Pt reported significant nausea but did not vomit.  Allowed pt extra time to rest and let symptoms return to baseline before ambulating to the gym with min A.  Pt to sit in lobby until able to safely drive home.  Will continue to address as needed.    Personal Factors and Comorbidities  Age;Comorbidity 3+;Sex    Comorbidities  HTN, DLD, NICM, anxiety,06/04/18: EF 45% but stable    Examination-Activity Limitations  Bed Mobility;Reach Overhead;Bend    Stability/Clinical Decision Making  Evolving/Moderate complexity    Rehab Potential  Good    PT Frequency  1x / week    PT Duration  6 weeks    PT Treatment/Interventions  ADLs/Self Care Home Management;Gait training;Stair training;Functional mobility training;Therapeutic activities;Therapeutic exercise;Balance training;Patient/family education;Neuromuscular re-education;Vestibular;Canalith Repostioning    PT Next Visit Plan  reassess and treat horizontal canal as needed; progress x1 viewing.    Consulted and Agree with Plan of Care  Patient       Patient will benefit from skilled therapeutic intervention in order to improve the following deficits and impairments:  Dizziness, Decreased balance, Difficulty walking  Visit Diagnosis: BPPV (benign paroxysmal positional vertigo), right  Dizziness and giddiness     Problem List Patient Active Problem List   Diagnosis Date Noted  . Sleep apnea   . GERD (gastroesophageal  reflux disease)   . Anxiety   . NICM (nonischemic cardiomyopathy) ([place])   . Hypothyroidism   . Nonischemic cardiomyopathy ([place]) 06/04/2018  . Essential hypertension 06/04/2018  . Left bundle branch block 06/04/2018  . Dyslipidemia 06/04/2018    Rico Junker, PT, DPT 07/09/19    12:10 PM  [facility] [address] [place], [place], [address] Phone: [phone]   Fax:  [phone]  Name: Stefanie Camacho MRN: [id number] Date of Birth: Mar 04, 1946

## 2019-07-10 ENCOUNTER — Encounter: Payer: Self-pay | Admitting: Physical Therapy

## 2019-07-10 ENCOUNTER — Ambulatory Visit: Payer: Medicare HMO | Attending: Internal Medicine

## 2019-07-10 ENCOUNTER — Other Ambulatory Visit: Payer: Self-pay

## 2019-07-10 ENCOUNTER — Ambulatory Visit: Payer: Medicare HMO | Admitting: Physical Therapy

## 2019-07-10 DIAGNOSIS — H8111 Benign paroxysmal vertigo, right ear: Secondary | ICD-10-CM | POA: Diagnosis not present

## 2019-07-10 DIAGNOSIS — R42 Dizziness and giddiness: Secondary | ICD-10-CM

## 2019-07-10 DIAGNOSIS — Z23 Encounter for immunization: Secondary | ICD-10-CM | POA: Insufficient documentation

## 2019-07-10 DIAGNOSIS — H8112 Benign paroxysmal vertigo, left ear: Secondary | ICD-10-CM | POA: Diagnosis not present

## 2019-07-10 NOTE — Therapy (Signed)
[facility] [address] [place], [place], [address] Phone: [phone]   Fax:  [phone]  Physical Therapy Treatment  Patient Details  Name: Biance Moncrief MRN: [id number] Date of Birth: [birth year] Referring Provider (PT): Alben Spittle MD   Encounter Date: 07/10/2019  PT End of Session - 07/10/19 2054    Visit Number  7    Number of Visits  8    Date for PT Re-Evaluation  07/13/19    Authorization Type  [employer]etna Medicare    PT Start Time  1407    PT Stop Time  1453    PT Time Calculation (min)  46 min    Activity Tolerance  Other (comment)   limited by dizziness   Behavior During Therapy  [facility] for tasks assessed/performed       Past Medical History:  Diagnosis Date  . Anxiety   . Dyslipidemia   . Essential hypertension   . GERD (gastroesophageal reflux disease)   . Hypothyroidism   . NICM (nonischemic cardiomyopathy) ([place])   . Sleep apnea     Past Surgical History:  Procedure Laterality Date  . ABDOMINAL HYSTERECTOMY    . BLADDER NECK RECONSTRUCTION    . SHOULDER SURGERY    . TONSILLECTOMY      There were no vitals filed for this visit.  Subjective Assessment - 07/10/19 1410    Subjective  Still feeling dizzy this morning, had a few episodes in the bed last night from L > R.  Felt nauseous.    Pertinent History  HTN, DLD, NICM, anxiety1/29/20: EF 45% but stable    Limitations  Walking    Patient Stated Goals  get rid of dizziness    Currently in Pain?  No/denies             Vestibular Assessment - 07/10/19 1411      Positional Testing   Horizontal Canal Testing  Horizontal Canal Right;Horizontal Canal Left      Horizontal Canal Right   Horizontal Canal Right Duration  0    Horizontal Canal Right Symptoms  Normal      Horizontal Canal Left   Horizontal Canal Left Duration  60 seconds    Horizontal Canal Left Symptoms  Ageotrophic                Vestibular  Treatment/Exercise - 07/10/19 1429      Vestibular Treatment/Exercise   Vestibular Treatment Provided  Canalith Repositioning    Canalith Repositioning  Comment      OTHER   Comment  Returned to ageotrophic nystagmus, less symptomatic to R.  Performed Kim Manuever to R with vibration with conversion to severe ageotrophic nystagmus with R and L roll with inability to determine least symptomatic side.  Did not convert to geotrophic nystagmus today so no barbeque roll performed.  When returned to sitting after first [place] pt demonstrated persistent L beating nystagmus that required 2 minutes and fixation to suppress nystagmus.  Pt became very nauseous but agreeable to second [place] with pt demonstrating ongoing ageotrophic nystagmus to R and L >60 seconds and increased intensity.  When returning to sitting pt no longer demonstrated persistent L beating nystagmus.  Pt continued to experience nausea and required prolonged rest period to allow symptoms to settle in order to ambulate out to son's truck.              PT Education - 07/10/19 2054    Education Details  contact  therapist for extra session next week if symptoms persist    Person(s) Educated  Patient    Methods  Explanation    Comprehension  Verbalized understanding       PT Short Term Goals - 06/11/19 0904      PT SHORT TERM GOAL #1   Title  Patient will report minimal to no dizziness with rolling in bed, bending over, and general activities of daily living. (STG target date: 06/19/2019)    Time  3    Period  Weeks    Status  Achieved    Target Date  06/19/19      PT SHORT TERM GOAL #2   Title  Patient tolerates initation of habituation exercises.    Time  3    Period  Weeks    Status  Achieved    Target Date  06/19/19        PT Long Term Goals - 07/02/19 1308      PT LONG TERM GOAL #1   Title  Patient will demonstrate independence with habituation and balance HEP. (all LTGs target date: 07/10/2019)     Time  6    Period  Weeks    Status  On-going    Target Date  07/13/19      PT LONG TERM GOAL #2   Title  Patient will demonstrate negative L dix-hallpike positional testing with reports of no dizziness to indicate resolution of L pBPPV.    Baseline  horizontal canal cupulolithiasis    Time  6    Period  Weeks    Status  On-going            Plan - 07/10/19 2055    Clinical Impression Statement  Pt returns today with return of symptoms with rolling and return of ageotrophic nystagmus >60 seconds, less symptomatic to R side.  With vibration and Stephani Police today pt experienced more severe and intense ageotrophic nystagmus with L and R roll which did not convert to geotrophic with 2 Kim manuevers.  May benefit from another method to clear horizontal canal cupulolithiasis.    Personal Factors and Comorbidities  Age;Comorbidity 3+;Sex    Comorbidities  HTN, DLD, NICM, anxiety,06/04/18: EF 45% but stable    Examination-Activity Limitations  Bed Mobility;Reach Overhead;Bend    Stability/Clinical Decision Making  Evolving/Moderate complexity    Rehab Potential  Good    PT Frequency  1x / week    PT Duration  6 weeks    PT Treatment/Interventions  ADLs/Self Care Home Management;Gait training;Stair training;Functional mobility training;Therapeutic activities;Therapeutic exercise;Balance training;Patient/family education;Neuromuscular re-education;Vestibular;Canalith Repostioning    PT Next Visit Plan  check LTG and recertify for more visits; reassess and treat horizontal canal as needed; progress x1 viewing.    Consulted and Agree with Plan of Care  Patient       Patient will benefit from skilled therapeutic intervention in order to improve the following deficits and impairments:  Dizziness, Decreased balance, Difficulty walking  Visit Diagnosis: BPPV (benign paroxysmal positional vertigo), right  Dizziness and giddiness     Problem List Patient Active Problem List   Diagnosis Date  Noted  . Sleep apnea   . GERD (gastroesophageal reflux disease)   . Anxiety   . NICM (nonischemic cardiomyopathy) ([place])   . Hypothyroidism   . Nonischemic cardiomyopathy ([place]) 06/04/2018  . Essential hypertension 06/04/2018  . Left bundle branch block 06/04/2018  . Dyslipidemia 06/04/2018    Rico Junker, PT, DPT 07/10/19  8:59 PM    [facility] [address] [place], [place], [address] Phone: [phone]   Fax:  [phone]  Name: Calvin Chura MRN: [id number] Date of Birth: [birth year]

## 2019-07-10 NOTE — Progress Notes (Signed)
   Covid-19 Vaccination Clinic  Name:  Stefanie Camacho    MRN: [id number] DOB: [birth year]  07/10/2019  [name] was observed post Covid-19 immunization for 15 minutes without incident. She was provided with Vaccine Information Sheet and instruction to access the V-Safe system.   [name] was instructed to call 911 with any severe reactions post vaccine: Marland Kitchen Difficulty breathing  . Swelling of face and throat  . A fast heartbeat  . A bad rash all over body  . Dizziness and weakness   Immunizations Administered    Name Date Dose VIS Date Route   Pfizer COVID-19 Vaccine 07/10/2019 10:20 AM 0.3 mL 04/17/2019 Intramuscular   Manufacturer: [place]   Lot: [id number]   [place]: [id number]

## 2019-07-15 ENCOUNTER — Other Ambulatory Visit: Payer: Self-pay | Admitting: Cardiology

## 2019-07-16 ENCOUNTER — Encounter: Payer: Self-pay | Admitting: Physical Therapy

## 2019-07-16 ENCOUNTER — Ambulatory Visit: Payer: Medicare HMO | Admitting: Physical Therapy

## 2019-07-16 ENCOUNTER — Other Ambulatory Visit: Payer: Self-pay

## 2019-07-16 DIAGNOSIS — H8112 Benign paroxysmal vertigo, left ear: Secondary | ICD-10-CM | POA: Diagnosis not present

## 2019-07-16 DIAGNOSIS — R42 Dizziness and giddiness: Secondary | ICD-10-CM

## 2019-07-16 DIAGNOSIS — H8111 Benign paroxysmal vertigo, right ear: Secondary | ICD-10-CM | POA: Diagnosis not present

## 2019-07-16 NOTE — Therapy (Signed)
[facility] [address] [place], [place], [address] Phone: [phone]   Fax:  [phone]  Physical Therapy Treatment  Patient Details  Name: Stefanie Camacho MRN: [id number] Date of Birth: Mar 16, 1946 Referring Provider (PT): Alben Spittle MD   Encounter Date: 07/16/2019  PT End of Session - 07/16/19 1420    Visit Number  8    Number of Visits  14    Date for PT Re-Evaluation  08/30/19    Authorization Type  [employer] Medicare    Progress Note Due on Visit  10    PT Start Time  0800    PT Stop Time  0850    PT Time Calculation (min)  50 min    Activity Tolerance  Patient tolerated treatment well   limited by dizziness   Behavior During Therapy  [facility] for tasks assessed/performed       Past Medical History:  Diagnosis Date  . Anxiety   . Dyslipidemia   . Essential hypertension   . GERD (gastroesophageal reflux disease)   . Hypothyroidism   . NICM (nonischemic cardiomyopathy) ([place])   . Sleep apnea     Past Surgical History:  Procedure Laterality Date  . ABDOMINAL HYSTERECTOMY    . BLADDER NECK RECONSTRUCTION    . SHOULDER SURGERY    . TONSILLECTOMY      There were no vitals filed for this visit.  Subjective Assessment - 07/16/19 0808    Subjective  Has not been dizzy since Monday night but was not feeling well last week Th, Friday and through the weekend - may have also been from COVID vaccine.    Pertinent History  HTN, DLD, NICM, anxiety1/29/20: EF 45% but stable    Limitations  Walking    Patient Stated Goals  get rid of dizziness    Currently in Pain?  No/denies             Vestibular Assessment - 07/16/19 0831      Positional Testing   Horizontal Canal Testing  Horizontal Canal Right;Horizontal Canal Left      Horizontal Canal Right   Horizontal Canal Right Duration  60 seconds    Horizontal Canal Right Symptoms  Ageotrophic   less symptomatic     Horizontal Canal Left   Horizontal  Canal Left Duration  60 seconds    Horizontal Canal Left Symptoms  Ageotrophic   more symptomatic              [facility] Adult PT Treatment/Exercise - 07/16/19 1010      Therapeutic Activites    Therapeutic Activities  Other Therapeutic Activities    Other Therapeutic Activities  With use of drawings/pictures discussed with pt various types of horizontal canal BPPV typical and atypical and various treatment maneuvers.  Pt asking if symptoms could be central - discussed signs and symptoms that indicate peripheral pathology.      Vestibular Treatment/Exercise - 07/16/19 0831      Vestibular Treatment/Exercise   Vestibular Treatment Provided  Canalith Repositioning    Canalith Repositioning  Appiani Right      Appiani Right   Number of Reps   2    Overall Response   Improved Symptoms      OTHER   Comment  Performed 270 deg canal roll from R > L to determine if otoconia were in the anterior arm of the horizontal canal.  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  Transitioned to [place] for R  PT Short Term Goals - 07/16/19 1430      PT SHORT TERM GOAL #1   Title  = LTG        PT Long Term Goals - 07/16/19 1421      PT LONG TERM GOAL #1   Title  Patient will demonstrate independence with habituation and balance HEP. (all LTGs target date: 07/10/2019)    Time  6    Period  Weeks    Status  On-going    Target Date  08/30/19      PT LONG TERM GOAL #2   Title  Patient will demonstrate negative L dix-hallpike positional testing with reports of no dizziness to indicate resolution of L pBPPV.    Time  6    Period  Weeks    Status  Achieved      PT LONG TERM GOAL #3   Title  Pt will demonstrate negative R positional testing and report no dizziness with rolling L and R on flat bed    Baseline  R horizontal canal cupulolithiasis    Time  6    Period  Weeks    Status  New    Target Date  08/30/19            Plan - 07/16/19 1423     Clinical Impression Statement  Pt was making good progress towards goals with resolution of L BPPV but then presented with horizontal canal cupulolithiasis that has responded poorly to repositioning manuevers with and without vibration.  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  Pt also reports lightheadedness and impaired balance when standing and ambulating.  Pt will benefit from continued skilled PT services to address these impairments and to minimize falls risk.  Goals ongoing.    Personal Factors and Comorbidities  Age;Comorbidity 3+;Sex    Comorbidities  HTN, DLD, NICM, anxiety,06/04/18: EF 45% but stable    Examination-Activity Limitations  Bed Mobility;Reach Overhead;Bend    Stability/Clinical Decision Making  --    Rehab Potential  Good    PT Frequency  1x / week    PT Duration  6 weeks    PT Treatment/Interventions  ADLs/Self Care Home Management;Gait training;Stair training;Functional mobility training;Therapeutic activities;Therapeutic exercise;Balance training;Patient/family education;Neuromuscular re-education;Vestibular;Canalith Repostioning    PT Next Visit Plan  reassess and treat horizontal canal as needed - Appiani manuever with repeated head shake prior to treatment/vibration with Appiani?  give repeated rolling as homework?  progress x1 viewing.    Consulted and Agree with Plan of Care  Patient       Patient will benefit from skilled therapeutic intervention in order to improve the following deficits and impairments:  Dizziness, Decreased balance, Difficulty walking  Visit Diagnosis: BPPV (benign paroxysmal positional vertigo), right  Dizziness and giddiness  BPPV (benign paroxysmal positional vertigo), left     Problem List Patient Active Problem List   Diagnosis Date Noted  . Sleep apnea   . GERD (gastroesophageal reflux disease)    . Anxiety   . NICM (nonischemic cardiomyopathy) ([place])   . Hypothyroidism   . Nonischemic cardiomyopathy ([place]) 06/04/2018  . Essential hypertension 06/04/2018  . Left bundle branch block 06/04/2018  . Dyslipidemia 06/04/2018    Rico Junker, PT, DPT 07/16/19    2:30 PM    [facility] [address] [place], [place], [address] Phone: [phone]  Fax:  [phone]  Name: Aamira Bischoff MRN: [id number] Date of Birth: 1945-07-27

## 2019-07-20 ENCOUNTER — Encounter: Payer: Self-pay | Admitting: Physician Assistant

## 2019-07-20 ENCOUNTER — Ambulatory Visit (INDEPENDENT_AMBULATORY_CARE_PROVIDER_SITE_OTHER): Payer: Medicare HMO

## 2019-07-20 ENCOUNTER — Ambulatory Visit: Payer: Medicare HMO | Admitting: Physician Assistant

## 2019-07-20 ENCOUNTER — Other Ambulatory Visit: Payer: Self-pay

## 2019-07-20 DIAGNOSIS — M25551 Pain in right hip: Secondary | ICD-10-CM | POA: Diagnosis not present

## 2019-07-20 NOTE — Progress Notes (Signed)
Office Visit Note   Patient: Stefanie Camacho           Date of Birth: 09-Nov-1945           MRN: [id number] Visit Date: 07/20/2019              Requested by: [name], MD [place],  [place] [address] PCP: [name], MD   Assessment & Plan: Visit Diagnoses:  1. Pain in right hip     Plan: She shown IT band stretching exercises which she will perform on her own.  Like to see her back next Monday for possible trochanteric injection versus sending her for intra-articular injection.  Questions encouraged and answered at length.  Follow-Up Instructions: Return in about 1 week (around 07/27/2019).   Orders:  Orders Placed This Encounter  Procedures  . XR HIP UNILAT W OR W/O PELVIS 2-3 VIEWS RIGHT   No orders of the defined types were placed in this encounter.     Procedures: No procedures performed   Clinical Data: No additional findings.   Subjective: Chief Complaint  Patient presents with  . Right Hip - Pain    HPI Stefanie Camacho 74 year old female comes in today with right hip pain that started last Thursday.  No injury.  She did walk her dog on a steep hill.  She feels as if the hip may give way.  Pain is throbbing but not in the groin area.  She is taking Tylenol.  She recently had her second Covid injection on March 05.  Denies any fevers chills.  She did have some pain in her shoulder after the injection in her axillary area.  Denies any radicular symptoms down either leg.  She is using a walker at home that her son got her due to the fact that she feels she may fall.  Review of Systems Please see HPI otherwise negative noncontributory.  Objective: Vital Signs: There were no vitals taken for this visit.  Physical Exam Constitutional:      Appearance: She is not ill-appearing.  Pulmonary:     Effort: Pulmonary effort is normal.  Neurological:     Mental Status: She is alert and oriented to person, place, and time.  Psychiatric:         Mood and Affect: Mood normal.     Ortho Exam Good range of motion both hips.  Right hip pain with internal rotation.  Tenderness over the right trochanteric region.  Ambulates without assistive device with an antalgic gait. Specialty Comments:  No specialty comments available.  Imaging: XR HIP UNILAT W OR W/O PELVIS 2-3 VIEWS RIGHT  Result Date: 07/20/2019 AP pelvis lateral view right hip: Bilateral hip joints well preserved.  No acute fractures or bony abnormalities.  Both hips are well located.    PMFS History: Patient Active Problem List   Diagnosis Date Noted  . Sleep apnea   . GERD (gastroesophageal reflux disease)   . Anxiety   . NICM (nonischemic cardiomyopathy) ([place])   . Hypothyroidism   . Nonischemic cardiomyopathy ([place]) 06/04/2018  . Essential hypertension 06/04/2018  . Left bundle branch block 06/04/2018  . Dyslipidemia 06/04/2018   Past Medical History:  Diagnosis Date  . Anxiety   . Dyslipidemia   . Essential hypertension   . GERD (gastroesophageal reflux disease)   . Hypothyroidism   . NICM (nonischemic cardiomyopathy) ([place])   . Sleep apnea     Family History  Problem Relation Age of  Onset  . Heart disease Mother   . Stroke Mother   . Heart disease Father   . Stroke Father   . Heart disease Maternal Grandfather   . Heart disease Paternal Grandfather     Past Surgical History:  Procedure Laterality Date  . ABDOMINAL HYSTERECTOMY    . BLADDER NECK RECONSTRUCTION    . SHOULDER SURGERY    . TONSILLECTOMY     Social History   Occupational History  . Not on file  Tobacco Use  . Smoking status: Never Smoker  . Smokeless tobacco: Never Used  Substance and Sexual Activity  . Alcohol use: Not Currently  . Drug use: Never  . Sexual activity: Not on file

## 2019-07-21 ENCOUNTER — Other Ambulatory Visit: Payer: Self-pay | Admitting: Cardiology

## 2019-07-21 DIAGNOSIS — R42 Dizziness and giddiness: Secondary | ICD-10-CM | POA: Diagnosis not present

## 2019-07-21 MED ORDER — METOPROLOL SUCCINATE ER 50 MG PO TB24: 50.0000 mg | ORAL_TABLET | Freq: Every day | ORAL | 1 refills | Status: DC

## 2019-07-21 NOTE — Telephone Encounter (Signed)
° ° °*  STAT* If patient is at the pharmacy, call can be transferred to refill team.   1. Which medications need to be refilled? (please list name of each medication and dose if known)   metoprolol succinate (TOPROL-XL) 50 MG 24 hr tablet    2. Which pharmacy/location (including street and city if local pharmacy) is medication to be sent to? [id number] - [place], [place]  3. Do they need a 30 day or 90 day supply? 90 days

## 2019-07-21 NOTE — Telephone Encounter (Signed)
Medication refilled

## 2019-07-22 DIAGNOSIS — Z1231 Encounter for screening mammogram for malignant neoplasm of breast: Secondary | ICD-10-CM | POA: Diagnosis not present

## 2019-07-23 ENCOUNTER — Ambulatory Visit: Payer: Medicare HMO | Admitting: Physical Therapy

## 2019-07-23 ENCOUNTER — Telehealth: Payer: Self-pay | Admitting: Physician Assistant

## 2019-07-23 ENCOUNTER — Other Ambulatory Visit: Payer: Self-pay

## 2019-07-23 ENCOUNTER — Encounter: Payer: Self-pay | Admitting: Physical Therapy

## 2019-07-23 DIAGNOSIS — H8111 Benign paroxysmal vertigo, right ear: Secondary | ICD-10-CM | POA: Diagnosis not present

## 2019-07-23 DIAGNOSIS — R42 Dizziness and giddiness: Secondary | ICD-10-CM

## 2019-07-23 DIAGNOSIS — H8112 Benign paroxysmal vertigo, left ear: Secondary | ICD-10-CM | POA: Diagnosis not present

## 2019-07-23 NOTE — Telephone Encounter (Signed)
Patient aware of the below message  

## 2019-07-23 NOTE — Telephone Encounter (Signed)
Patient called.  She is no longer in pain and feels her injection on Monday is no longer necessary. She would like a call back to see whether or not Artis Delay thinks she should still get it.  Call back: [phone]

## 2019-07-23 NOTE — Therapy (Signed)
[facility] [address] [place], [place], [address] Phone: [phone]   Fax:  [phone]  Physical Therapy Treatment  Patient Details  Name: Stefanie Camacho MRN: [id number] Date of Birth: 01/22/1946 Referring Provider (PT): Alben Spittle MD   Encounter Date: 07/23/2019  PT End of Session - 07/23/19 0845    Visit Number  9    Number of Visits  14    Date for PT Re-Evaluation  08/30/19    Authorization Type  [employer]etna Medicare    Progress Note Due on Visit  10    PT Start Time  0804    PT Stop Time  0842    PT Time Calculation (min)  38 min    Activity Tolerance  Patient tolerated treatment well   limited by dizziness   Behavior During Therapy  [facility] for tasks assessed/performed       Past Medical History:  Diagnosis Date  . Anxiety   . Dyslipidemia   . Essential hypertension   . GERD (gastroesophageal reflux disease)   . Hypothyroidism   . NICM (nonischemic cardiomyopathy) ([place])   . Sleep apnea     Past Surgical History:  Procedure Laterality Date  . ABDOMINAL HYSTERECTOMY    . BLADDER NECK RECONSTRUCTION    . SHOULDER SURGERY    . TONSILLECTOMY      There were no vitals filed for this visit.  Subjective Assessment - 07/23/19 0806    Subjective  Didn't sleep well last night, no real reason except she knew she had to get up and come to an appointment in the morning.  Thursday night pt had sudden pain in R groin, Friday went to physician who felt like it was bursitis.  Feeling better.  No dizziness this week.    Pertinent History  HTN, DLD, NICM, anxiety1/29/20: EF 45% but stable    Limitations  Walking    Patient Stated Goals  get rid of dizziness    Currently in Pain?  No/denies             Vestibular Assessment - 07/23/19 0810      Positional Testing   Dix-Hallpike  Dix-Hallpike Right;Dix-Hallpike Left    Horizontal Canal Testing  Horizontal Canal Right;Horizontal Canal Left       Dix-Hallpike Right   Dix-Hallpike Right Duration  0    Dix-Hallpike Right Symptoms  No nystagmus      Dix-Hallpike Left   Dix-Hallpike Left Duration  30 seconds    Dix-Hallpike Left Symptoms  Right nystagmus      Horizontal Canal Right   Horizontal Canal Right Duration  0    Horizontal Canal Right Symptoms  Normal      Horizontal Canal Left   Horizontal Canal Left Duration  0    Horizontal Canal Left Symptoms  Normal                Vestibular Treatment/Exercise - 07/23/19 0819      Vestibular Treatment/Exercise   Vestibular Treatment Provided  Habituation;Gaze    Habituation Exercises  Horizontal Roll;Brandt Daroff    Gaze Exercises  X1 Viewing Horizontal;X1 Viewing Vertical      Nestor Lewandowsky   Number of Reps   3    Symptom Description   only very mild symptoms when rising from sidelying.  No symptoms from sit > sidelying to R or L      Horizontal Roll   Number of Reps   3    Symptom  Description   mild symptoms when rolling to L      X1 Viewing Horizontal   Foot Position  feet together    Reps  2    Comments  30 seconds, mild imbalance/sway      X1 Viewing Vertical   Foot Position  feet together    Reps  2    Comments  30 seconds, mild sway            PT Education - 07/23/19 0841    Education Details  Contact therapist if she needs appointment next week; reviewed habituation but did not provide for HEP today, continue with x1 viewing for HEP    Person(s) Educated  Patient    Methods  Explanation;Demonstration    Comprehension  Verbalized understanding;Returned demonstration       PT Short Term Goals - 07/16/19 1430      PT SHORT TERM GOAL #1   Title  = LTG        PT Long Term Goals - 07/16/19 1421      PT LONG TERM GOAL #1   Title  Patient will demonstrate independence with habituation and balance HEP. (all LTGs target date: 07/10/2019)    Time  6    Period  Weeks    Status  On-going    Target Date  08/30/19      PT LONG TERM GOAL #2    Title  Patient will demonstrate negative L dix-hallpike positional testing with reports of no dizziness to indicate resolution of L pBPPV.    Time  6    Period  Weeks    Status  Achieved      PT LONG TERM GOAL #3   Title  Pt will demonstrate negative R positional testing and report no dizziness with rolling L and R on flat bed    Baseline  R horizontal canal cupulolithiasis    Time  6    Period  Weeks    Status  New    Target Date  08/30/19              Patient will benefit from skilled therapeutic intervention in order to improve the following deficits and impairments:     Visit Diagnosis: No diagnosis found.     Problem List Patient Active Problem List   Diagnosis Date Noted  . Sleep apnea   . GERD (gastroesophageal reflux disease)   . Anxiety   . NICM (nonischemic cardiomyopathy) ([place])   . Hypothyroidism   . Nonischemic cardiomyopathy ([place]) 06/04/2018  . Essential hypertension 06/04/2018  . Left bundle branch block 06/04/2018  . Dyslipidemia 06/04/2018    Rico Junker, PT, DPT 07/23/19    12:18 PM    [facility] [address] [place], [place], [address] Phone: [phone]   Fax:  [phone]  Name: Stefanie Camacho MRN: [id number] Date of Birth: [birth year]

## 2019-07-23 NOTE — Patient Instructions (Signed)
  Gaze Stabilization: Standing Feet Together    Feet together, keeping eyes on target on wall __3__ feet away, tilt head down 15-30 and move head side to side for _30___ seconds, slightly faster. Repeat while moving head up and down for _30___ seconds, slightly faster. Do __2__ sessions per day.

## 2019-07-23 NOTE — Telephone Encounter (Signed)
See below

## 2019-07-23 NOTE — Telephone Encounter (Signed)
That is fine she can just follow-up with [place] as needed.

## 2019-07-27 ENCOUNTER — Ambulatory Visit: Payer: Medicare HMO | Admitting: Physician Assistant

## 2019-08-05 DIAGNOSIS — R69 Illness, unspecified: Secondary | ICD-10-CM | POA: Diagnosis not present

## 2019-08-11 ENCOUNTER — Other Ambulatory Visit: Payer: Self-pay

## 2019-08-11 ENCOUNTER — Encounter: Payer: Self-pay | Admitting: Physical Therapy

## 2019-08-11 ENCOUNTER — Ambulatory Visit: Payer: Medicare HMO | Attending: Family Medicine | Admitting: Physical Therapy

## 2019-08-11 DIAGNOSIS — M62830 Muscle spasm of back: Secondary | ICD-10-CM | POA: Diagnosis not present

## 2019-08-11 DIAGNOSIS — M5412 Radiculopathy, cervical region: Secondary | ICD-10-CM | POA: Diagnosis not present

## 2019-08-11 DIAGNOSIS — M5441 Lumbago with sciatica, right side: Secondary | ICD-10-CM | POA: Diagnosis not present

## 2019-08-11 DIAGNOSIS — M9903 Segmental and somatic dysfunction of lumbar region: Secondary | ICD-10-CM | POA: Diagnosis not present

## 2019-08-11 DIAGNOSIS — M9901 Segmental and somatic dysfunction of cervical region: Secondary | ICD-10-CM | POA: Diagnosis not present

## 2019-08-11 DIAGNOSIS — R42 Dizziness and giddiness: Secondary | ICD-10-CM | POA: Insufficient documentation

## 2019-08-11 DIAGNOSIS — H8111 Benign paroxysmal vertigo, right ear: Secondary | ICD-10-CM | POA: Diagnosis not present

## 2019-08-11 DIAGNOSIS — M9902 Segmental and somatic dysfunction of thoracic region: Secondary | ICD-10-CM | POA: Diagnosis not present

## 2019-08-11 DIAGNOSIS — H8112 Benign paroxysmal vertigo, left ear: Secondary | ICD-10-CM | POA: Diagnosis not present

## 2019-08-11 NOTE — Patient Instructions (Signed)
  Gaze Stabilization: Standing Feet Together    Feet together, keeping eyes on target on wall __3__ feet away, tilt head down 15-30 and move head side to side for __60__ seconds, slightly faster. Repeat while moving head up and down for _60_ seconds, slightly faster. Do __2__ sessions per day.   Feet Together (Compliant Surface) Head Motion - Eyes Closed    Stand on compliant surface: pillow with feet together. Close eyes and move head slowly, up and down 10 times.  Move head slowly, left and right 10 times Repeat 1 times per session. Do 2 sessions per day.

## 2019-08-11 NOTE — Therapy (Signed)
[facility] [address] [place], [place], [address] Phone: [phone]   Fax:  [phone]  Physical Therapy Treatment - 10th visit Progress Note and Discharge Summary  Patient Details  Name: Stefanie Camacho MRN: [id number] Date of Birth: 25-May-1945 Referring Provider (PT): Alben Spittle MD   Encounter Date: 08/11/2019  Progress Note Reporting Period 05/04/19 to 08/11/19  See note below for Objective Data and Assessment of Progress/Goals.   PT End of Session - 08/11/19 1113    Visit Number  10    Number of Visits  14    Date for PT Re-Evaluation  08/30/19    Authorization Type  [employer]    Progress Note Due on Visit  10    PT Start Time  0808    PT Stop Time  0847    PT Time Calculation (min)  39 min    Activity Tolerance  Patient tolerated treatment well   limited by dizziness   Behavior During Therapy  [facility] for tasks assessed/performed       Past Medical History:  Diagnosis Date  . Anxiety   . Dyslipidemia   . Essential hypertension   . GERD (gastroesophageal reflux disease)   . Hypothyroidism   . NICM (nonischemic cardiomyopathy) ([place])   . Sleep apnea     Past Surgical History:  Procedure Laterality Date  . ABDOMINAL HYSTERECTOMY    . BLADDER NECK RECONSTRUCTION    . SHOULDER SURGERY    . TONSILLECTOMY      There were no vitals filed for this visit.  Subjective Assessment - 08/11/19 0811    Subjective  No further spinning, still having some lightheadedness.  Heart monitor report - some minor episodes of V tach and extra systole (per report).  Follows up with cardiology next month.    Pertinent History  HTN, DLD, NICM, anxiety1/29/20: EF 45% but stable    Limitations  Walking    Patient Stated Goals  get rid of dizziness    Currently in Pain?  Yes    Pain Location  Knee    Pain Orientation  Left;Right    Pain Descriptors / Indicators  Sore             Vestibular Assessment -  08/11/19 0820      Positional Testing   Dix-Hallpike  Dix-Hallpike Right;Dix-Hallpike Left    Horizontal Canal Testing  Horizontal Canal Right;Horizontal Canal Left      Dix-Hallpike Right   Dix-Hallpike Right Duration  0    Dix-Hallpike Right Symptoms  No nystagmus      Dix-Hallpike Left   Dix-Hallpike Left Duration  0    Dix-Hallpike Left Symptoms  No nystagmus      Horizontal Canal Right   Horizontal Canal Right Duration  0    Horizontal Canal Right Symptoms  Normal      Horizontal Canal Left   Horizontal Canal Left Duration  60    Horizontal Canal Left Symptoms  Ageotrophic      Positional Sensitivities   Sit to Supine  No dizziness    Supine to Left Side  No dizziness    Supine to Right Side  No dizziness    Supine to Sitting  No dizziness    Right Hallpike  No dizziness    Up from Right Hallpike  No dizziness    Up from Left Hallpike  No dizziness    Head Turning x 5  No dizziness   off balance  in standing   Head Nodding x 5  No dizziness    Pivot Right in Standing  No dizziness    Pivot Left in Standing  No dizziness    Rolling Right  No dizziness    Rolling Left  No dizziness                Vestibular Treatment/Exercise - 08/11/19 0835      Vestibular Treatment/Exercise   Vestibular Treatment Provided  Gaze    Gaze Exercises  X1 Viewing Horizontal;X1 Viewing Vertical      X1 Viewing Horizontal   Foot Position  feet together    Reps  2    Comments  increased to 60 seconds, slightly increased speed; began to feel "hot" at 30 seconds and HR after 56 bpm, skipping a beat every 5-6 beats      X1 Viewing Vertical   Foot Position  feet together    Reps  2    Comments  increased to 60 seconds, slightly increased speed; began to feel "hot" at 30 seconds and HR after 60 bpm, skipping a beat every 5-6 beats         Balance Exercises - 08/11/19 0839      Balance Exercises: Standing   Standing Eyes Opened  Narrow base of support (BOS);Head  turns;Foam/compliant surface;Other reps (comment)   10 reps head turns/nods   Standing Eyes Closed  Narrow base of support (BOS);Head turns;Foam/compliant surface;5 reps        PT Education - 08/11/19 1113    Education Details  progress towards goals, D/C today, final HEP    Person(s) Educated  Patient    Methods  Explanation;Demonstration;Handout    Comprehension  Verbalized understanding;Returned demonstration       Gaze Stabilization: Standing Feet Together    Feet together, keeping eyes on target on wall __3__ feet away, tilt head down 15-30 and move head side to side for __60__ seconds, slightly faster. Repeat while moving head up and down for _60_ seconds, slightly faster. Do __2__ sessions per day.   Feet Together (Compliant Surface) Head Motion - Eyes Closed    Stand on compliant surface: pillow with feet together. Close eyes and move head slowly, up and down 10 times.  Move head slowly, left and right 10 times Repeat 1 times per session. Do 2 sessions per day.        PT Short Term Goals - 07/16/19 1430      PT SHORT TERM GOAL #1   Title  = LTG        PT Long Term Goals - 08/11/19 0815      PT LONG TERM GOAL #1   Title  Patient will demonstrate independence with habituation and balance HEP. (all LTGs target date: 07/10/2019)    Time  6    Period  Weeks    Status  Achieved      PT LONG TERM GOAL #2   Title  Patient will demonstrate negative L dix-hallpike positional testing with reports of no dizziness to indicate resolution of L pBPPV.    Time  6    Period  Weeks    Status  Achieved      PT LONG TERM GOAL #3   Title  Pt will demonstrate negative R positional testing and report no dizziness with rolling L and R on flat bed    Baseline  nystagmus but no vertigo    Time  6    Period  Weeks    Status  Achieved            Plan - 08/11/19 1114    Clinical Impression Statement  Pt has made excellent progress towards goals and has met all LTG.   She has experienced a full resolution of vertigo with all bed mobility; pt reports only a sense of lightheadedness persists with certain movements.  Rest of session focused on providing pt with D/C recommendations and finalizing HEP.  Pt is safe for D/C today.    Personal Factors and Comorbidities  Age;Comorbidity 3+;Sex    Comorbidities  HTN, DLD, NICM, anxiety,06/04/18: EF 45% but stable    Examination-Activity Limitations  Bed Mobility;Reach Overhead;Bend    Rehab Potential  Good    PT Frequency  1x / week    PT Duration  6 weeks    PT Treatment/Interventions  ADLs/Self Care Home Management;Gait training;Stair training;Functional mobility training;Therapeutic activities;Therapeutic exercise;Balance training;Patient/family education;Neuromuscular re-education;Vestibular;Canalith Repostioning    PT Next Visit Plan  D/C    Consulted and Agree with Plan of Care  Patient       Patient will benefit from skilled therapeutic intervention in order to improve the following deficits and impairments:  Dizziness, Decreased balance, Difficulty walking  Visit Diagnosis: Dizziness and giddiness  BPPV (benign paroxysmal positional vertigo), right  BPPV (benign paroxysmal positional vertigo), left     Problem List Patient Active Problem List   Diagnosis Date Noted  . Sleep apnea   . GERD (gastroesophageal reflux disease)   . Anxiety   . NICM (nonischemic cardiomyopathy) ([place])   . Hypothyroidism   . Nonischemic cardiomyopathy ([place]) 06/04/2018  . Essential hypertension 06/04/2018  . Left bundle branch block 06/04/2018  . Dyslipidemia 06/04/2018    PHYSICAL THERAPY DISCHARGE SUMMARY  Visits from Start of Care: 10   Current functional level related to goals / functional outcomes: See impression statement and LTG achievement above.   Remaining deficits: Mild lightheadedness/disequilibrium and mild imbalance   Education / Equipment: HEP  Plan: Patient agrees to discharge.  Patient goals  were met. Patient is being discharged due to meeting the stated rehab goals.  ?????     Rico Junker, PT, DPT 08/11/19    11:18 AM    [facility] [address] [place], [place], [address] Phone: [phone]   Fax:  [phone]  Name: Stefanie Camacho MRN: [id number] Date of Birth: [birth year]

## 2019-08-12 DIAGNOSIS — M9903 Segmental and somatic dysfunction of lumbar region: Secondary | ICD-10-CM | POA: Diagnosis not present

## 2019-08-12 DIAGNOSIS — M9902 Segmental and somatic dysfunction of thoracic region: Secondary | ICD-10-CM | POA: Diagnosis not present

## 2019-08-12 DIAGNOSIS — M5441 Lumbago with sciatica, right side: Secondary | ICD-10-CM | POA: Diagnosis not present

## 2019-08-12 DIAGNOSIS — M62830 Muscle spasm of back: Secondary | ICD-10-CM | POA: Diagnosis not present

## 2019-08-12 DIAGNOSIS — M9901 Segmental and somatic dysfunction of cervical region: Secondary | ICD-10-CM | POA: Diagnosis not present

## 2019-08-12 DIAGNOSIS — M5412 Radiculopathy, cervical region: Secondary | ICD-10-CM | POA: Diagnosis not present

## 2019-08-17 DIAGNOSIS — M5412 Radiculopathy, cervical region: Secondary | ICD-10-CM | POA: Diagnosis not present

## 2019-08-17 DIAGNOSIS — M5441 Lumbago with sciatica, right side: Secondary | ICD-10-CM | POA: Diagnosis not present

## 2019-08-17 DIAGNOSIS — M62830 Muscle spasm of back: Secondary | ICD-10-CM | POA: Diagnosis not present

## 2019-08-17 DIAGNOSIS — M9901 Segmental and somatic dysfunction of cervical region: Secondary | ICD-10-CM | POA: Diagnosis not present

## 2019-08-17 DIAGNOSIS — M9903 Segmental and somatic dysfunction of lumbar region: Secondary | ICD-10-CM | POA: Diagnosis not present

## 2019-08-17 DIAGNOSIS — M9902 Segmental and somatic dysfunction of thoracic region: Secondary | ICD-10-CM | POA: Diagnosis not present

## 2019-08-18 DIAGNOSIS — M9901 Segmental and somatic dysfunction of cervical region: Secondary | ICD-10-CM | POA: Diagnosis not present

## 2019-08-18 DIAGNOSIS — M5412 Radiculopathy, cervical region: Secondary | ICD-10-CM | POA: Diagnosis not present

## 2019-08-18 DIAGNOSIS — M9903 Segmental and somatic dysfunction of lumbar region: Secondary | ICD-10-CM | POA: Diagnosis not present

## 2019-08-18 DIAGNOSIS — M9902 Segmental and somatic dysfunction of thoracic region: Secondary | ICD-10-CM | POA: Diagnosis not present

## 2019-08-18 DIAGNOSIS — M62830 Muscle spasm of back: Secondary | ICD-10-CM | POA: Diagnosis not present

## 2019-08-18 DIAGNOSIS — M5441 Lumbago with sciatica, right side: Secondary | ICD-10-CM | POA: Diagnosis not present

## 2019-08-24 DIAGNOSIS — D225 Melanocytic nevi of trunk: Secondary | ICD-10-CM | POA: Diagnosis not present

## 2019-08-24 DIAGNOSIS — M62830 Muscle spasm of back: Secondary | ICD-10-CM | POA: Diagnosis not present

## 2019-08-24 DIAGNOSIS — M9903 Segmental and somatic dysfunction of lumbar region: Secondary | ICD-10-CM | POA: Diagnosis not present

## 2019-08-24 DIAGNOSIS — M5412 Radiculopathy, cervical region: Secondary | ICD-10-CM | POA: Diagnosis not present

## 2019-08-24 DIAGNOSIS — M9902 Segmental and somatic dysfunction of thoracic region: Secondary | ICD-10-CM | POA: Diagnosis not present

## 2019-08-24 DIAGNOSIS — M5441 Lumbago with sciatica, right side: Secondary | ICD-10-CM | POA: Diagnosis not present

## 2019-08-24 DIAGNOSIS — L218 Other seborrheic dermatitis: Secondary | ICD-10-CM | POA: Diagnosis not present

## 2019-08-24 DIAGNOSIS — M9901 Segmental and somatic dysfunction of cervical region: Secondary | ICD-10-CM | POA: Diagnosis not present

## 2019-08-24 DIAGNOSIS — L821 Other seborrheic keratosis: Secondary | ICD-10-CM | POA: Diagnosis not present

## 2019-08-24 DIAGNOSIS — L918 Other hypertrophic disorders of the skin: Secondary | ICD-10-CM | POA: Diagnosis not present

## 2019-08-25 DIAGNOSIS — M62830 Muscle spasm of back: Secondary | ICD-10-CM | POA: Diagnosis not present

## 2019-08-25 DIAGNOSIS — M9901 Segmental and somatic dysfunction of cervical region: Secondary | ICD-10-CM | POA: Diagnosis not present

## 2019-08-25 DIAGNOSIS — M5441 Lumbago with sciatica, right side: Secondary | ICD-10-CM | POA: Diagnosis not present

## 2019-08-25 DIAGNOSIS — M9903 Segmental and somatic dysfunction of lumbar region: Secondary | ICD-10-CM | POA: Diagnosis not present

## 2019-08-25 DIAGNOSIS — M5412 Radiculopathy, cervical region: Secondary | ICD-10-CM | POA: Diagnosis not present

## 2019-08-25 DIAGNOSIS — M9902 Segmental and somatic dysfunction of thoracic region: Secondary | ICD-10-CM | POA: Diagnosis not present

## 2019-08-26 DIAGNOSIS — M9902 Segmental and somatic dysfunction of thoracic region: Secondary | ICD-10-CM | POA: Diagnosis not present

## 2019-08-26 DIAGNOSIS — M62830 Muscle spasm of back: Secondary | ICD-10-CM | POA: Diagnosis not present

## 2019-08-26 DIAGNOSIS — M9901 Segmental and somatic dysfunction of cervical region: Secondary | ICD-10-CM | POA: Diagnosis not present

## 2019-08-26 DIAGNOSIS — M5412 Radiculopathy, cervical region: Secondary | ICD-10-CM | POA: Diagnosis not present

## 2019-08-26 DIAGNOSIS — M5441 Lumbago with sciatica, right side: Secondary | ICD-10-CM | POA: Diagnosis not present

## 2019-08-26 DIAGNOSIS — M9903 Segmental and somatic dysfunction of lumbar region: Secondary | ICD-10-CM | POA: Diagnosis not present

## 2019-08-31 DIAGNOSIS — M9901 Segmental and somatic dysfunction of cervical region: Secondary | ICD-10-CM | POA: Diagnosis not present

## 2019-08-31 DIAGNOSIS — M62830 Muscle spasm of back: Secondary | ICD-10-CM | POA: Diagnosis not present

## 2019-08-31 DIAGNOSIS — M9902 Segmental and somatic dysfunction of thoracic region: Secondary | ICD-10-CM | POA: Diagnosis not present

## 2019-08-31 DIAGNOSIS — M5441 Lumbago with sciatica, right side: Secondary | ICD-10-CM | POA: Diagnosis not present

## 2019-08-31 DIAGNOSIS — M5412 Radiculopathy, cervical region: Secondary | ICD-10-CM | POA: Diagnosis not present

## 2019-08-31 DIAGNOSIS — M9903 Segmental and somatic dysfunction of lumbar region: Secondary | ICD-10-CM | POA: Diagnosis not present

## 2019-09-01 DIAGNOSIS — M5441 Lumbago with sciatica, right side: Secondary | ICD-10-CM | POA: Diagnosis not present

## 2019-09-01 DIAGNOSIS — M9901 Segmental and somatic dysfunction of cervical region: Secondary | ICD-10-CM | POA: Diagnosis not present

## 2019-09-01 DIAGNOSIS — M62830 Muscle spasm of back: Secondary | ICD-10-CM | POA: Diagnosis not present

## 2019-09-01 DIAGNOSIS — M9903 Segmental and somatic dysfunction of lumbar region: Secondary | ICD-10-CM | POA: Diagnosis not present

## 2019-09-01 DIAGNOSIS — M9902 Segmental and somatic dysfunction of thoracic region: Secondary | ICD-10-CM | POA: Diagnosis not present

## 2019-09-01 DIAGNOSIS — M5412 Radiculopathy, cervical region: Secondary | ICD-10-CM | POA: Diagnosis not present

## 2019-09-02 DIAGNOSIS — M62830 Muscle spasm of back: Secondary | ICD-10-CM | POA: Diagnosis not present

## 2019-09-02 DIAGNOSIS — M9901 Segmental and somatic dysfunction of cervical region: Secondary | ICD-10-CM | POA: Diagnosis not present

## 2019-09-02 DIAGNOSIS — M5412 Radiculopathy, cervical region: Secondary | ICD-10-CM | POA: Diagnosis not present

## 2019-09-02 DIAGNOSIS — M5441 Lumbago with sciatica, right side: Secondary | ICD-10-CM | POA: Diagnosis not present

## 2019-09-02 DIAGNOSIS — M9902 Segmental and somatic dysfunction of thoracic region: Secondary | ICD-10-CM | POA: Diagnosis not present

## 2019-09-02 DIAGNOSIS — M9903 Segmental and somatic dysfunction of lumbar region: Secondary | ICD-10-CM | POA: Diagnosis not present

## 2019-09-07 ENCOUNTER — Telehealth: Payer: Self-pay | Admitting: Orthopaedic Surgery

## 2019-09-07 DIAGNOSIS — M5441 Lumbago with sciatica, right side: Secondary | ICD-10-CM | POA: Diagnosis not present

## 2019-09-07 DIAGNOSIS — M9901 Segmental and somatic dysfunction of cervical region: Secondary | ICD-10-CM | POA: Diagnosis not present

## 2019-09-07 DIAGNOSIS — M9902 Segmental and somatic dysfunction of thoracic region: Secondary | ICD-10-CM | POA: Diagnosis not present

## 2019-09-07 DIAGNOSIS — M62830 Muscle spasm of back: Secondary | ICD-10-CM | POA: Diagnosis not present

## 2019-09-07 DIAGNOSIS — M5412 Radiculopathy, cervical region: Secondary | ICD-10-CM | POA: Diagnosis not present

## 2019-09-07 DIAGNOSIS — M9903 Segmental and somatic dysfunction of lumbar region: Secondary | ICD-10-CM | POA: Diagnosis not present

## 2019-09-07 NOTE — Telephone Encounter (Signed)
Pt called stating she was ready to go forward with the cortizone shot that was originally set for 07/27/19; I did go ahead and make her an appt for 09/07/19 but just wanted to make sure she didn't have to wait to be cleared before getting the inj.   [phone]

## 2019-09-08 DIAGNOSIS — M5412 Radiculopathy, cervical region: Secondary | ICD-10-CM | POA: Diagnosis not present

## 2019-09-08 DIAGNOSIS — M5441 Lumbago with sciatica, right side: Secondary | ICD-10-CM | POA: Diagnosis not present

## 2019-09-08 DIAGNOSIS — M62830 Muscle spasm of back: Secondary | ICD-10-CM | POA: Diagnosis not present

## 2019-09-08 DIAGNOSIS — M9901 Segmental and somatic dysfunction of cervical region: Secondary | ICD-10-CM | POA: Diagnosis not present

## 2019-09-08 DIAGNOSIS — M9902 Segmental and somatic dysfunction of thoracic region: Secondary | ICD-10-CM | POA: Diagnosis not present

## 2019-09-08 DIAGNOSIS — M9903 Segmental and somatic dysfunction of lumbar region: Secondary | ICD-10-CM | POA: Diagnosis not present

## 2019-09-09 DIAGNOSIS — M62830 Muscle spasm of back: Secondary | ICD-10-CM | POA: Diagnosis not present

## 2019-09-09 DIAGNOSIS — M9903 Segmental and somatic dysfunction of lumbar region: Secondary | ICD-10-CM | POA: Diagnosis not present

## 2019-09-09 DIAGNOSIS — M5441 Lumbago with sciatica, right side: Secondary | ICD-10-CM | POA: Diagnosis not present

## 2019-09-09 DIAGNOSIS — M9901 Segmental and somatic dysfunction of cervical region: Secondary | ICD-10-CM | POA: Diagnosis not present

## 2019-09-09 DIAGNOSIS — M5412 Radiculopathy, cervical region: Secondary | ICD-10-CM | POA: Diagnosis not present

## 2019-09-09 DIAGNOSIS — M9902 Segmental and somatic dysfunction of thoracic region: Secondary | ICD-10-CM | POA: Diagnosis not present

## 2019-09-10 ENCOUNTER — Ambulatory Visit: Payer: Medicare HMO | Admitting: Physician Assistant

## 2019-09-10 ENCOUNTER — Encounter: Payer: Self-pay | Admitting: Physician Assistant

## 2019-09-10 ENCOUNTER — Other Ambulatory Visit: Payer: Self-pay

## 2019-09-10 DIAGNOSIS — M7061 Trochanteric bursitis, right hip: Secondary | ICD-10-CM | POA: Diagnosis not present

## 2019-09-10 MED ORDER — METHYLPREDNISOLONE ACETATE 40 MG/ML IJ SUSP
40.0000 mg | INTRAMUSCULAR | Status: AC | PRN
  Administered 2019-09-10: 40 mg via INTRA_ARTICULAR

## 2019-09-10 MED ORDER — LIDOCAINE HCL 1 % IJ SOLN
3.0000 mL | INTRAMUSCULAR | Status: AC | PRN
  Administered 2019-09-10: 3 mL

## 2019-09-10 NOTE — Progress Notes (Signed)
   Procedure Note  Patient: Stefanie Camacho             Date of Birth: Sep 05, 1945           MRN: [id number]             Visit Date: 09/10/2019 HPI: Stefanie Camacho comes in today with right hip pain again she has been having pain right hip splint ongoing for less than a month.  She had her second Covid injection on March 5 and so we had refrain from giving her a cortisone injection a week ago.  She still having pain lateral aspect hip with radicular symptoms.  She does have some groin pain which she says is sharp groin pain at times.  Physical exam: Right hip full range of motion without pain.  Tenderness over the right trochanteric region.  Procedures: Visit Diagnoses:  1. Trochanteric bursitis, right hip     Large Joint Inj: R greater trochanter on 09/10/2019 2:30 PM Indications: pain Details: 22 G 1.5 in needle, lateral approach  Arthrogram: No  Medications: 3 mL lidocaine 1 %; 40 mg methylPREDNISolone acetate 40 MG/ML Outcome: tolerated well, no immediate complications Procedure, treatment alternatives, risks and benefits explained, specific risks discussed. Consent was given by the patient. Immediately prior to procedure a time out was called to verify the correct patient, procedure, equipment, support staff and site/side marked as required. Patient was prepped and draped in the usual sterile fashion.     Plan: She will continue to work on IT band stretching.  If she continues to have groin pain over the next 7 to 10 days would recommend intra-articular injection of the hip.  She will otherwise follow-up with [place] on an as-needed basis.  Questions encouraged and answered at length.

## 2019-09-14 DIAGNOSIS — M5412 Radiculopathy, cervical region: Secondary | ICD-10-CM | POA: Diagnosis not present

## 2019-09-14 DIAGNOSIS — M5441 Lumbago with sciatica, right side: Secondary | ICD-10-CM | POA: Diagnosis not present

## 2019-09-14 DIAGNOSIS — M9902 Segmental and somatic dysfunction of thoracic region: Secondary | ICD-10-CM | POA: Diagnosis not present

## 2019-09-14 DIAGNOSIS — M62830 Muscle spasm of back: Secondary | ICD-10-CM | POA: Diagnosis not present

## 2019-09-14 DIAGNOSIS — M9903 Segmental and somatic dysfunction of lumbar region: Secondary | ICD-10-CM | POA: Diagnosis not present

## 2019-09-14 DIAGNOSIS — M9901 Segmental and somatic dysfunction of cervical region: Secondary | ICD-10-CM | POA: Diagnosis not present

## 2019-09-16 DIAGNOSIS — M9901 Segmental and somatic dysfunction of cervical region: Secondary | ICD-10-CM | POA: Diagnosis not present

## 2019-09-16 DIAGNOSIS — M62830 Muscle spasm of back: Secondary | ICD-10-CM | POA: Diagnosis not present

## 2019-09-16 DIAGNOSIS — M5412 Radiculopathy, cervical region: Secondary | ICD-10-CM | POA: Diagnosis not present

## 2019-09-16 DIAGNOSIS — M9903 Segmental and somatic dysfunction of lumbar region: Secondary | ICD-10-CM | POA: Diagnosis not present

## 2019-09-16 DIAGNOSIS — M9902 Segmental and somatic dysfunction of thoracic region: Secondary | ICD-10-CM | POA: Diagnosis not present

## 2019-09-16 DIAGNOSIS — M5441 Lumbago with sciatica, right side: Secondary | ICD-10-CM | POA: Diagnosis not present

## 2019-09-21 ENCOUNTER — Telehealth: Payer: Self-pay | Admitting: *Deleted

## 2019-09-21 DIAGNOSIS — M5412 Radiculopathy, cervical region: Secondary | ICD-10-CM | POA: Diagnosis not present

## 2019-09-21 DIAGNOSIS — M9902 Segmental and somatic dysfunction of thoracic region: Secondary | ICD-10-CM | POA: Diagnosis not present

## 2019-09-21 DIAGNOSIS — M62830 Muscle spasm of back: Secondary | ICD-10-CM | POA: Diagnosis not present

## 2019-09-21 DIAGNOSIS — M9903 Segmental and somatic dysfunction of lumbar region: Secondary | ICD-10-CM | POA: Diagnosis not present

## 2019-09-21 DIAGNOSIS — M9901 Segmental and somatic dysfunction of cervical region: Secondary | ICD-10-CM | POA: Diagnosis not present

## 2019-09-21 DIAGNOSIS — M5441 Lumbago with sciatica, right side: Secondary | ICD-10-CM | POA: Diagnosis not present

## 2019-09-21 NOTE — Telephone Encounter (Signed)
   [facility] Pre-operative Risk Assessment    HEARTCARE STAFF: - Please ensure there is not already an duplicate clearance open for this procedure - Under Visit Info/Reason for Call, type in Other and utilize the format Clearance MM/DD/YY or Clearance TBD  Request for surgical clearance:  1. What type of surgery is being performed? Unknown, not listed  2. When is this surgery scheduled? TBD   3. What type of clearance is required (medical clearance vs. Pharmacy clearance to hold med vs. Both)? Both  4. Are there any medications that need to be held prior to surgery and how long? No  5. Practice name and name of physician performing surgery? [facility]  6. What is the office phone number? [phone]   7.   What is the office fax number? [phone] OR [phone]  8.   Anesthesia type (None, local, MAC, general) ? UNKNOWN  Requesting most recent EKG, ECHO, Stress test, Cath Report, cardiac studies, and/or H&P   Ronaldo Miyamoto 09/21/2019, 3:07 PM  _________________________________________________________________   (provider comments below)

## 2019-09-22 DIAGNOSIS — M9901 Segmental and somatic dysfunction of cervical region: Secondary | ICD-10-CM | POA: Diagnosis not present

## 2019-09-22 DIAGNOSIS — M5441 Lumbago with sciatica, right side: Secondary | ICD-10-CM | POA: Diagnosis not present

## 2019-09-22 DIAGNOSIS — M9902 Segmental and somatic dysfunction of thoracic region: Secondary | ICD-10-CM | POA: Diagnosis not present

## 2019-09-22 DIAGNOSIS — M62830 Muscle spasm of back: Secondary | ICD-10-CM | POA: Diagnosis not present

## 2019-09-22 DIAGNOSIS — M5412 Radiculopathy, cervical region: Secondary | ICD-10-CM | POA: Diagnosis not present

## 2019-09-22 DIAGNOSIS — M9903 Segmental and somatic dysfunction of lumbar region: Secondary | ICD-10-CM | POA: Diagnosis not present

## 2019-09-22 NOTE — Telephone Encounter (Signed)
Left message for [facility] to call back with more information.

## 2019-09-23 DIAGNOSIS — E785 Hyperlipidemia, unspecified: Secondary | ICD-10-CM | POA: Diagnosis not present

## 2019-09-23 DIAGNOSIS — E039 Hypothyroidism, unspecified: Secondary | ICD-10-CM | POA: Diagnosis not present

## 2019-09-23 DIAGNOSIS — R69 Illness, unspecified: Secondary | ICD-10-CM | POA: Diagnosis not present

## 2019-09-23 DIAGNOSIS — I10 Essential (primary) hypertension: Secondary | ICD-10-CM | POA: Diagnosis not present

## 2019-09-23 DIAGNOSIS — K219 Gastro-esophageal reflux disease without esophagitis: Secondary | ICD-10-CM | POA: Diagnosis not present

## 2019-09-23 NOTE — Telephone Encounter (Signed)
I left a message for [facility] to please call our office back with the procedure that the pt will be having.

## 2019-09-23 NOTE — Telephone Encounter (Signed)
Left message for [facility] to please call the office and confirm the procedure being done as well type of anesthesia being used if any.

## 2019-09-24 NOTE — Telephone Encounter (Signed)
Our office has left numerous messages for the requesting office to please call our office and update procedure information. We are needing to know what the procedure and if any anesthesia will be used. Pt has appt tomorrow 09/25/19 with Dr. Agustin Cree her cardiologist. I will send clearance notes to MD for appt tomorrow. Perhaps the pt may be able to let Dr. Agustin Cree know of the procedure being done. I will remove from the pre op call back pool.

## 2019-09-24 NOTE — Telephone Encounter (Signed)
[facility] calling back stating they are not requesting cardiac clearance for the patient.

## 2019-09-25 ENCOUNTER — Ambulatory Visit: Payer: Medicare HMO | Admitting: Cardiology

## 2019-09-25 ENCOUNTER — Other Ambulatory Visit: Payer: Self-pay

## 2019-09-25 ENCOUNTER — Encounter: Payer: Self-pay | Admitting: Cardiology

## 2019-09-25 VITALS — BP 110/70 | HR 66 | Ht 66.0 in | Wt 200.0 lb

## 2019-09-25 DIAGNOSIS — F419 Anxiety disorder, unspecified: Secondary | ICD-10-CM

## 2019-09-25 DIAGNOSIS — I4729 Other ventricular tachycardia: Secondary | ICD-10-CM

## 2019-09-25 DIAGNOSIS — R69 Illness, unspecified: Secondary | ICD-10-CM | POA: Diagnosis not present

## 2019-09-25 DIAGNOSIS — I10 Essential (primary) hypertension: Secondary | ICD-10-CM | POA: Diagnosis not present

## 2019-09-25 DIAGNOSIS — E785 Hyperlipidemia, unspecified: Secondary | ICD-10-CM | POA: Diagnosis not present

## 2019-09-25 DIAGNOSIS — I428 Other cardiomyopathies: Secondary | ICD-10-CM

## 2019-09-25 DIAGNOSIS — I447 Left bundle-branch block, unspecified: Secondary | ICD-10-CM

## 2019-09-25 DIAGNOSIS — I472 Ventricular tachycardia: Secondary | ICD-10-CM | POA: Diagnosis not present

## 2019-09-25 DIAGNOSIS — R42 Dizziness and giddiness: Secondary | ICD-10-CM

## 2019-09-25 HISTORY — DX: Dizziness and giddiness: R42

## 2019-09-25 NOTE — Progress Notes (Addendum)
Cardiology Office Note:    Date:  09/25/2019   ID:  Stefanie Camacho, DOB 1945-07-01, MRN [id number]  PCP:  Stefanie Ada, MD  Cardiologist:  Stefanie Campus, MD    Referring MD: Stefanie Ada, MD   Chief Complaint  Patient presents with  . Follow-up  Up doing better  History of Present Illness:    Stefanie Camacho is a 74 y.o. female with past medical history significant for left bundle branch block, also nonischemic cardiomyopathy with ejection fraction 40 to 45%, essential hypertension, dyslipidemia, ventricular ectopy and nonsustained ventricular tachycardia noted on recent monitor.  She was also find to have significant vertigo which was treated successfully and she is doing much better from that point review.  She comes today to my office for follow-up.  Denies having any passing out.  Says that physical therapy for her vertigo she does not have any.  She is doing well she is able to walk climb stairs and have no difficulty doing it.  Concerning is the fact that she did have some nonsustained ventricular tachycardia on monitor.  She was taking citalopram at the time of her monitor.  Citalopram has been discontinued and now she is taking Prozac.  Past Medical History:  Diagnosis Date  . Anxiety   . Dyslipidemia   . Essential hypertension   . GERD (gastroesophageal reflux disease)   . Hypothyroidism   . Left bundle branch block 06/04/2018  . NICM (nonischemic cardiomyopathy) ([place])   . Nonischemic cardiomyopathy ([place]) 06/04/2018   Ejection fraction 45% in summer 2019  . Sleep apnea     Past Surgical History:  Procedure Laterality Date  . ABDOMINAL HYSTERECTOMY    . BLADDER NECK RECONSTRUCTION    . SHOULDER SURGERY    . TONSILLECTOMY      Current Medications: Current Meds  Medication Sig  . aspirin EC 81 MG tablet Take 81 mg by mouth daily.  Marland Kitchen atorvastatin (LIPITOR) 10 MG tablet TAKE 1 TABLET (10 MG TOTAL) BY MOUTH DAILY AT 6 PM.  . FLUoxetine (PROZAC) 10 MG tablet   .  levothyroxine (SYNTHROID, LEVOTHROID) 25 MCG tablet Take 1 tablet by mouth daily. Pt takes 1 and 1/2 daily  . losartan (COZAAR) 50 MG tablet TAKE 1 TABLET BY MOUTH EVERY DAY  . metoprolol succinate (TOPROL-XL) 50 MG 24 hr tablet Take 1 tablet (50 mg total) by mouth daily. Take with or immediately following a meal.  . omeprazole (PRILOSEC) 20 MG capsule Take 20 mg by mouth daily.  Marland Kitchen spironolactone (ALDACTONE) 25 MG tablet TAKE 1/2 TABLET BY MOUTH EVERY DAY     Allergies:   Patient has no known allergies.   Social History   Socioeconomic History  . Marital status: Divorced    Spouse name: Not on file  . Number of children: Not on file  . Years of education: Not on file  . Highest education level: Not on file  Occupational History  . Not on file  Tobacco Use  . Smoking status: Never Smoker  . Smokeless tobacco: Never Used  Substance and Sexual Activity  . Alcohol use: Not Currently  . Drug use: Never  . Sexual activity: Not on file  Other Topics Concern  . Not on file  Social History Narrative  . Not on file   Social Determinants of Health   Financial Resource Strain:   . Difficulty of Paying Living Expenses:   Food Insecurity:   . Worried About Charity fundraiser in the Last Year:   .  Ran Out of Food in the Last Year:   Transportation Needs:   . Film/video editor (Medical):   Marland Kitchen Lack of Transportation (Non-Medical):   Physical Activity:   . Days of Exercise per Week:   . Minutes of Exercise per Session:   Stress:   . Feeling of Stress :   Social Connections:   . Frequency of Communication with Friends and Family:   . Frequency of Social Gatherings with Friends and Family:   . Attends Religious Services:   . Active Member of Clubs or Organizations:   . Attends Archivist Meetings:   Marland Kitchen Marital Status:      Family History: The patient's family history includes Heart disease in her father, maternal grandfather, mother, and paternal grandfather; Stroke  in her father and mother. ROS:   Please see the history of present illness.    All 14 point review of systems negative except as described per history of present illness  EKGs/Labs/Other Studies Reviewed:      Recent Labs: No results found for requested labs within last 8760 hours.  Recent Lipid Panel    Component Value Date/Time   CHOL 133 07/17/2018 0933   TRIG 130 07/17/2018 0933   HDL 40 07/17/2018 0933   CHOLHDL 3.3 07/17/2018 0933   LDLCALC 67 07/17/2018 0933    Physical Exam:    VS:  BP 110/70   Pulse 66   Ht 5\' 6"  (1.676 m)   Wt 200 lb (90.7 kg)   SpO2 95%   BMI 32.28 kg/m     Wt Readings from Last 3 Encounters:  09/25/19 200 lb (90.7 kg)  06/29/19 200 lb 1.9 oz (90.8 kg)  12/29/18 200 lb (90.7 kg)     GEN:  Well nourished, well developed in no acute distress HEENT: Normal NECK: No JVD; No carotid bruits LYMPHATICS: No lymphadenopathy CARDIAC: RRR, no murmurs, no rubs, no gallops RESPIRATORY:  Clear to auscultation without rales, wheezing or rhonchi  ABDOMEN: Soft, non-tender, non-distended MUSCULOSKELETAL:  No edema; No deformity  SKIN: Warm and dry LOWER EXTREMITIES: no swelling NEUROLOGIC:  Alert and oriented x 3 PSYCHIATRIC:  Normal affect   ASSESSMENT:    1. Nonischemic cardiomyopathy ([place])   2. Left bundle branch block   3. Essential hypertension   4. Dyslipidemia   5. Anxiety   6. Vertigo    PLAN:    In order of problems listed above:  1. Nonischemic cardiomyopathy, she is on ARB as well as beta-blocker we will continue monitoring.  Since her ejection fraction is more than 40% and she does not have any congestive heart failure I will continue ARB without switching her to Stefanie Camacho yet.  Continue beta-blocker. 2. Left bundle branch block, chronic, noted. 3. Essential hypertension well-controlled with blood pressure 110/70. 4. Anxiety treated now with Prozac with good response. 5. Vertigo doing well from that point  review. 6. Nonsustained ventricular tachycardia I will refer her to our EP team for consideration of more advanced assessment.  I warned her about signs and symptoms of significant arrhythmia like significant dizziness different than vertigo as well as passing out.  I asked her to let me know how she does. 6.  She is scheduled to have cataract surgery.  There is no cardiac contraindication for this procedure.  It is a low risk procedure  Medication Adjustments/Labs and Tests Ordered: Current medicines are reviewed at length with the patient today.  Concerns regarding medicines are outlined above.  No orders  of the defined types were placed in this encounter.  Medication changes: No orders of the defined types were placed in this encounter.   Signed, Park Liter, MD, [facility] 09/25/2019 10:02 AM    [place]

## 2019-09-25 NOTE — Telephone Encounter (Signed)
Patient came in for visit today, per patient she is scheduled to have cataract surgery next Friday.

## 2019-09-25 NOTE — Addendum Note (Signed)
Addended by: Linna Hoff R on: 09/25/2019 10:14 AM   Modules accepted: Orders

## 2019-09-25 NOTE — Patient Instructions (Signed)
Medication Instructions:  Your physician recommends that you continue on your current medications as directed. Please refer to the Current Medication list given to you today.  *If you need a refill on your cardiac medications before your next appointment, please call your pharmacy*   Lab Work: None.  If you have labs (blood work) drawn today and your tests are completely normal, you will receive your results only by: Marland Kitchen MyChart Message (if you have MyChart) OR . A paper copy in the mail If you have any lab test that is abnormal or we need to change your treatment, we will call you to review the results.   Testing/Procedures: None.    Follow-Up: At [facility], you and your health needs are our priority.  As part of our continuing mission to provide you with exceptional heart care, we have created designated Provider Care Teams.  These Care Teams include your primary Cardiologist (physician) and Advanced Practice Providers (APPs -  Physician Assistants and Nurse Practitioners) who all work together to provide you with the care you need, when you need it.  We recommend signing up for the patient portal called "MyChart".  Sign up information is provided on this After Visit Summary.  MyChart is used to connect with patients for Virtual Visits (Telemedicine).  Patients are able to view lab/test results, encounter notes, upcoming appointments, etc.  Non-urgent messages can be sent to your provider as well.   To learn more about what you can do with MyChart, go to [email or web address].    Your next appointment:   5 month(s)  The format for your next appointment:   In Person  Provider:   Jenne Campus, MD   Other Instructions  Dr. Agustin Cree has referred you to Dr. Curt Bears with electrophysiology.

## 2019-09-28 DIAGNOSIS — M9901 Segmental and somatic dysfunction of cervical region: Secondary | ICD-10-CM | POA: Diagnosis not present

## 2019-09-28 DIAGNOSIS — M62830 Muscle spasm of back: Secondary | ICD-10-CM | POA: Diagnosis not present

## 2019-09-28 DIAGNOSIS — M9902 Segmental and somatic dysfunction of thoracic region: Secondary | ICD-10-CM | POA: Diagnosis not present

## 2019-09-28 DIAGNOSIS — M9903 Segmental and somatic dysfunction of lumbar region: Secondary | ICD-10-CM | POA: Diagnosis not present

## 2019-09-28 DIAGNOSIS — M5412 Radiculopathy, cervical region: Secondary | ICD-10-CM | POA: Diagnosis not present

## 2019-09-28 DIAGNOSIS — M5441 Lumbago with sciatica, right side: Secondary | ICD-10-CM | POA: Diagnosis not present

## 2019-09-28 NOTE — Telephone Encounter (Signed)
   Primary Cardiologist: Jenne Campus, MD  Chart reviewed as part of pre-operative protocol coverage. Given past medical history and time since last visit, based on ACC/AHA guidelines, Eriana Suliman would be at acceptable risk for the planned procedure without further cardiovascular testing.   I will route this recommendation to the requesting party via Epic fax function and remove from pre-op pool.  Please call with questions.  Jossie Ng. Carmell Elgin NP-C    09/28/2019, 8:31 AM [place] [address] Office [phone] Fax [phone]

## 2019-09-30 DIAGNOSIS — M5412 Radiculopathy, cervical region: Secondary | ICD-10-CM | POA: Diagnosis not present

## 2019-09-30 DIAGNOSIS — M9902 Segmental and somatic dysfunction of thoracic region: Secondary | ICD-10-CM | POA: Diagnosis not present

## 2019-09-30 DIAGNOSIS — M62830 Muscle spasm of back: Secondary | ICD-10-CM | POA: Diagnosis not present

## 2019-09-30 DIAGNOSIS — M9901 Segmental and somatic dysfunction of cervical region: Secondary | ICD-10-CM | POA: Diagnosis not present

## 2019-09-30 DIAGNOSIS — M9903 Segmental and somatic dysfunction of lumbar region: Secondary | ICD-10-CM | POA: Diagnosis not present

## 2019-09-30 DIAGNOSIS — M5441 Lumbago with sciatica, right side: Secondary | ICD-10-CM | POA: Diagnosis not present

## 2019-10-01 DIAGNOSIS — H25811 Combined forms of age-related cataract, right eye: Secondary | ICD-10-CM | POA: Diagnosis not present

## 2019-10-01 DIAGNOSIS — H2511 Age-related nuclear cataract, right eye: Secondary | ICD-10-CM | POA: Diagnosis not present

## 2019-10-13 ENCOUNTER — Other Ambulatory Visit: Payer: Self-pay | Admitting: Cardiology

## 2019-10-13 DIAGNOSIS — I10 Essential (primary) hypertension: Secondary | ICD-10-CM

## 2019-10-13 MED ORDER — LOSARTAN POTASSIUM 50 MG PO TABS: 50.0000 mg | ORAL_TABLET | Freq: Every day | ORAL | 2 refills | Status: DC

## 2019-10-13 MED ORDER — ATORVASTATIN CALCIUM 10 MG PO TABS: 10.0000 mg | ORAL_TABLET | Freq: Every day | ORAL | 2 refills | Status: DC

## 2019-10-13 MED ORDER — METOPROLOL SUCCINATE ER 50 MG PO TB24: 50.0000 mg | ORAL_TABLET | Freq: Every day | ORAL | 2 refills | Status: DC

## 2019-10-13 NOTE — Telephone Encounter (Signed)
*  STAT* If patient is at the pharmacy, call can be transferred to refill team.   1. Which medications need to be refilled? (please list name of each medication and dose if known)  metoprolol succinate (TOPROL-XL) 50 MG 24 hr tablet atorvastatin (LIPITOR) 10 MG tablet losartan (COZAAR) 50 MG tablet  2. Which pharmacy/location (including street and city if local pharmacy) is medication to be sent to? [place], [place]  3. Do they need a 30 day or 90 day supply? 90 day

## 2019-10-19 DIAGNOSIS — M9903 Segmental and somatic dysfunction of lumbar region: Secondary | ICD-10-CM | POA: Diagnosis not present

## 2019-10-19 DIAGNOSIS — M5441 Lumbago with sciatica, right side: Secondary | ICD-10-CM | POA: Diagnosis not present

## 2019-10-19 DIAGNOSIS — M62830 Muscle spasm of back: Secondary | ICD-10-CM | POA: Diagnosis not present

## 2019-10-19 DIAGNOSIS — M9901 Segmental and somatic dysfunction of cervical region: Secondary | ICD-10-CM | POA: Diagnosis not present

## 2019-10-19 DIAGNOSIS — M9902 Segmental and somatic dysfunction of thoracic region: Secondary | ICD-10-CM | POA: Diagnosis not present

## 2019-10-19 DIAGNOSIS — M5412 Radiculopathy, cervical region: Secondary | ICD-10-CM | POA: Diagnosis not present

## 2019-10-21 DIAGNOSIS — M62830 Muscle spasm of back: Secondary | ICD-10-CM | POA: Diagnosis not present

## 2019-10-21 DIAGNOSIS — M9903 Segmental and somatic dysfunction of lumbar region: Secondary | ICD-10-CM | POA: Diagnosis not present

## 2019-10-21 DIAGNOSIS — M5412 Radiculopathy, cervical region: Secondary | ICD-10-CM | POA: Diagnosis not present

## 2019-10-21 DIAGNOSIS — M9902 Segmental and somatic dysfunction of thoracic region: Secondary | ICD-10-CM | POA: Diagnosis not present

## 2019-10-21 DIAGNOSIS — M5441 Lumbago with sciatica, right side: Secondary | ICD-10-CM | POA: Diagnosis not present

## 2019-10-21 DIAGNOSIS — M9901 Segmental and somatic dysfunction of cervical region: Secondary | ICD-10-CM | POA: Diagnosis not present

## 2019-10-28 DIAGNOSIS — M62830 Muscle spasm of back: Secondary | ICD-10-CM | POA: Diagnosis not present

## 2019-10-28 DIAGNOSIS — M9903 Segmental and somatic dysfunction of lumbar region: Secondary | ICD-10-CM | POA: Diagnosis not present

## 2019-10-28 DIAGNOSIS — M5441 Lumbago with sciatica, right side: Secondary | ICD-10-CM | POA: Diagnosis not present

## 2019-10-28 DIAGNOSIS — M9901 Segmental and somatic dysfunction of cervical region: Secondary | ICD-10-CM | POA: Diagnosis not present

## 2019-10-28 DIAGNOSIS — M5412 Radiculopathy, cervical region: Secondary | ICD-10-CM | POA: Diagnosis not present

## 2019-10-28 DIAGNOSIS — M9902 Segmental and somatic dysfunction of thoracic region: Secondary | ICD-10-CM | POA: Diagnosis not present

## 2019-10-29 DIAGNOSIS — H25812 Combined forms of age-related cataract, left eye: Secondary | ICD-10-CM | POA: Diagnosis not present

## 2019-10-29 DIAGNOSIS — H2512 Age-related nuclear cataract, left eye: Secondary | ICD-10-CM | POA: Diagnosis not present

## 2019-11-02 ENCOUNTER — Other Ambulatory Visit: Payer: Self-pay

## 2019-11-02 ENCOUNTER — Encounter: Payer: Self-pay | Admitting: Cardiology

## 2019-11-02 ENCOUNTER — Ambulatory Visit: Payer: Medicare HMO | Admitting: Cardiology

## 2019-11-02 VITALS — BP 110/70 | HR 57 | Ht 66.0 in | Wt 197.8 lb

## 2019-11-02 DIAGNOSIS — I472 Ventricular tachycardia, unspecified: Secondary | ICD-10-CM

## 2019-11-02 NOTE — Patient Instructions (Signed)
Medication Instructions:  Your physician recommends that you continue on your current medications as directed. Please refer to the Current Medication list given to you today.  *If you need a refill on your cardiac medications before your next appointment, please call your pharmacy*   Lab Work: None ordered If you have labs (blood work) drawn today and your tests are completely normal, you will receive your results only by: Marland Kitchen MyChart Message (if you have MyChart) OR . A paper copy in the mail If you have any lab test that is abnormal or we need to change your treatment, we will call you to review the results.   Testing/Procedures: None ordered   Follow-Up: At [facility], you and your health needs are our priority.  As part of our continuing mission to provide you with exceptional heart care, we have created designated Provider Care Teams.  These Care Teams include your primary Cardiologist (physician) and Advanced Practice Providers (APPs -  Physician Assistants and Nurse Practitioners) who all work together to provide you with the care you need, when you need it.  We recommend signing up for the patient portal called "MyChart".  Sign up information is provided on this After Visit Summary.  MyChart is used to connect with patients for Virtual Visits (Telemedicine).  Patients are able to view lab/test results, encounter notes, upcoming appointments, etc.  Non-urgent messages can be sent to your provider as well.   To learn more about what you can do with MyChart, go to [email or web address].    Your next appointment:    as needed  The format for your next appointment:   In Person  Provider:   Allegra Lai, MD   Thank you for choosing [facility]!!   Trinidad Curet, RN [phone]    Other Instructions

## 2019-11-02 NOTE — Progress Notes (Signed)
Electrophysiology Office Note   Date:  11/02/2019   ID:  Stefanie Camacho, DOB February 28, 1946, MRN [id number]  PCP:  [name], MD  Cardiologist: Agustin Cree Primary Electrophysiologist:  Will Meredith Leeds, MD    Chief Complaint: PVCs   History of Present Illness: Stefanie Camacho is a 73 y.o. female who is being seen today for the evaluation of PVCs/VT at the request of Park Liter, MD. Presenting today for electrophysiology evaluation.  She has a history significant for hypertension, hyperlipidemia sleep apnea, nonischemic cardiomyopathy, and left bundle branch block.    Today, she denies symptoms of palpitations, chest pain, shortness of breath, orthopnea, PND, lower extremity edema, claudication, dizziness, presyncope, syncope, bleeding, or neurologic sequela. The patient is tolerating medications without difficulties.  Overall she is feeling well.  She has no chest pain or shortness of breath.  She was having vertigo this past winter.  To ensure that her dizziness was not caused by arrhythmias, she had a cardiac monitor placed and was found to have both PVCs and short runs of nonsustained ventricular tachycardia.  When the monitor was on, he did not have symptoms.  She does not have dizziness.  She went to physical therapy which greatly improved her vertigo symptoms.   Past Medical History:  Diagnosis Date  . Anxiety   . Dyslipidemia   . Essential hypertension   . GERD (gastroesophageal reflux disease)   . Hypothyroidism   . Left bundle branch block 06/04/2018  . NICM (nonischemic cardiomyopathy) ([place])   . Nonischemic cardiomyopathy ([place]) 06/04/2018   Ejection fraction 45% in summer 2019  . Sleep apnea    Past Surgical History:  Procedure Laterality Date  . ABDOMINAL HYSTERECTOMY    . BLADDER NECK RECONSTRUCTION    . SHOULDER SURGERY    . TONSILLECTOMY       Current Outpatient Medications  Medication Sig Dispense Refill  . aspirin EC 81 MG tablet Take 81 mg by  mouth daily.    Marland Kitchen atorvastatin (LIPITOR) 10 MG tablet Take 1 tablet (10 mg total) by mouth daily at 6 PM. 90 tablet 2  . FLUoxetine (PROZAC) 10 MG tablet     . levothyroxine (SYNTHROID, LEVOTHROID) 25 MCG tablet Take 1 tablet by mouth daily. Pt takes 1 and 1/2 daily    . losartan (COZAAR) 50 MG tablet Take 1 tablet (50 mg total) by mouth daily. 90 tablet 2  . metoprolol succinate (TOPROL-XL) 50 MG 24 hr tablet Take 1 tablet (50 mg total) by mouth daily. Take with or immediately following a meal. 90 tablet 2  . omeprazole (PRILOSEC) 20 MG capsule Take 20 mg by mouth daily.    Marland Kitchen spironolactone (ALDACTONE) 25 MG tablet TAKE 1/2 TABLET BY MOUTH EVERY DAY 45 tablet 5   No current facility-administered medications for this visit.    Allergies:   Patient has no known allergies.   Social History:  The patient  reports that she has never smoked. She has never used smokeless tobacco. She reports previous alcohol use. She reports that she does not use drugs.   Family History:  The patient's family history includes Heart disease in her father, maternal grandfather, mother, and paternal grandfather; Stroke in her father and mother.    ROS:  Please see the history of present illness.   Otherwise, review of systems is positive for none.   All other systems are reviewed and negative.    PHYSICAL EXAM: VS:  BP 110/70   Pulse (!) 57  Ht 5\' 6"  (1.676 m)   Wt 197 lb 12.8 oz (89.7 kg)   SpO2 95%   BMI 31.93 kg/m  , BMI Body mass index is 31.93 kg/m. GEN: Well nourished, well developed, in no acute distress  HEENT: normal  Neck: no JVD, carotid bruits, or masses Cardiac: RRR; no murmurs, rubs, or gallops,no edema  Respiratory:  clear to auscultation bilaterally, normal work of breathing GI: soft, nontender, nondistended, + BS MS: no deformity or atrophy  Skin: warm and dry Neuro:  Strength and sensation are intact Psych: euthymic mood, full affect  EKG:  EKG is ordered today. Personal review of  the ekg ordered shows sinus rhythm  Recent Labs: No results found for requested labs within last 8760 hours.    Lipid Panel     Component Value Date/Time   CHOL 133 07/17/2018 0933   TRIG 130 07/17/2018 0933   HDL 40 07/17/2018 0933   CHOLHDL 3.3 07/17/2018 0933   LDLCALC 67 07/17/2018 0933     Wt Readings from Last 3 Encounters:  11/02/19 197 lb 12.8 oz (89.7 kg)  09/25/19 200 lb (90.7 kg)  06/29/19 200 lb 1.9 oz (90.8 kg)      Other studies Reviewed: Additional studies/ records that were reviewed today include: TTE 07/07/19  Review of the above records today demonstrates:  1. Left ventricular ejection fraction, by estimation, is 40 to 45%. The  left ventricle has mildly decreased function. The left ventricle has no  regional wall motion abnormalities. Left ventricular diastolic parameters  are consistent with Grade I  diastolic dysfunction (impaired relaxation).   Monitor 07/29/19 Symptomatic PVCs with ventricular bigeminy. 2 episodes of short lasting nonsustained ventricular tachycardia. PVCs and APCs noted   ASSESSMENT AND PLAN:  1.  Nonischemic cardiomyopathy: Currently on ARB and beta-blocker.  No obvious volume overload.  She does have a left bundle branch block on old ECGs, but her QRS is narrow today.  This could be a rate related bundle branch block.  2.  Hypertension: Currently well controlled  3.  Nonsustained ventricular tachycardia: Found on cardiac monitor.  She had up to 8 beats of a wide-complex tachycardia.  She also has approximately 6% PVCs.  She is completely unaware of her arrhythmias.  She is currently feeling better than she has in quite some time.  The monitor was placed as she was having vertigo which has improved with physical therapy.  This point, would hold off on further medical therapy.  We will continue her current treatment for her nonischemic cardiomyopathy.  Case discussed with primary cardiology  Current medicines are reviewed at  length with the patient today.   The patient does not have concerns regarding her medicines.  The following changes were made today:  none  Labs/ tests ordered today include:  Orders Placed This Encounter  Procedures  . EKG 12-Lead     Disposition:   FU with Will Camnitz as needed  Signed, Will Meredith Leeds, MD  11/02/2019 2:19 PM     [facility] [address] [place] [address] [phone] (office) [phone] (fax)

## 2019-11-13 ENCOUNTER — Emergency Department (HOSPITAL_COMMUNITY): Payer: Medicare HMO

## 2019-11-13 ENCOUNTER — Other Ambulatory Visit: Payer: Self-pay

## 2019-11-13 ENCOUNTER — Encounter (HOSPITAL_COMMUNITY): Payer: Self-pay

## 2019-11-13 ENCOUNTER — Emergency Department (HOSPITAL_COMMUNITY)
Admission: EM | Admit: 2019-11-13 | Discharge: 2019-11-13 | Disposition: A | Discharge status: Home / Self Care | Payer: Medicare HMO | Attending: Emergency Medicine | Admitting: Emergency Medicine

## 2019-11-13 DIAGNOSIS — R5381 Other malaise: Secondary | ICD-10-CM | POA: Diagnosis not present

## 2019-11-13 DIAGNOSIS — Z7982 Long term (current) use of aspirin: Secondary | ICD-10-CM | POA: Insufficient documentation

## 2019-11-13 DIAGNOSIS — Z79899 Other long term (current) drug therapy: Secondary | ICD-10-CM | POA: Diagnosis not present

## 2019-11-13 DIAGNOSIS — I10 Essential (primary) hypertension: Secondary | ICD-10-CM | POA: Insufficient documentation

## 2019-11-13 DIAGNOSIS — M25551 Pain in right hip: Secondary | ICD-10-CM | POA: Insufficient documentation

## 2019-11-13 DIAGNOSIS — M25572 Pain in left ankle and joints of left foot: Secondary | ICD-10-CM | POA: Diagnosis not present

## 2019-11-13 DIAGNOSIS — E039 Hypothyroidism, unspecified: Secondary | ICD-10-CM | POA: Insufficient documentation

## 2019-11-13 DIAGNOSIS — R52 Pain, unspecified: Secondary | ICD-10-CM | POA: Diagnosis not present

## 2019-11-13 DIAGNOSIS — M1611 Unilateral primary osteoarthritis, right hip: Secondary | ICD-10-CM | POA: Diagnosis not present

## 2019-11-13 LAB — URINALYSIS, ROUTINE W REFLEX MICROSCOPIC
Bilirubin Urine: NEGATIVE
Glucose, UA: NEGATIVE mg/dL
Hgb urine dipstick: NEGATIVE
Ketones, ur: NEGATIVE mg/dL
Nitrite: NEGATIVE
Protein, ur: NEGATIVE mg/dL
Specific Gravity, Urine: 1.015 (ref 1.005–1.030)
pH: 5 (ref 5.0–8.0)

## 2019-11-13 MED ORDER — LIDOCAINE 5 % EX PTCH
1.0000 | MEDICATED_PATCH | CUTANEOUS | Status: DC
  Administered 2019-11-13: 1 via TRANSDERMAL
  Filled 2019-11-13: qty 1

## 2019-11-13 MED ORDER — HYDROCODONE-ACETAMINOPHEN 5-325 MG PO TABS: 1.0000 | ORAL_TABLET | Freq: Three times a day (TID) | ORAL | 0 refills | Status: DC | PRN

## 2019-11-13 MED ORDER — HYDROCODONE-ACETAMINOPHEN 5-325 MG PO TABS
1.0000 | ORAL_TABLET | Freq: Once | ORAL | Status: AC
  Administered 2019-11-13: 1 via ORAL
  Filled 2019-11-13: qty 1

## 2019-11-13 NOTE — Discharge Instructions (Addendum)
Prescription given for Norco. Take medication as directed and do not operate machinery, drive a car, or work while taking this medication as it can make you drowsy.   Please follow up with your primary care provider within 5-7 days for re-evaluation of your symptoms. If you do not have a primary care provider, information for a healthcare clinic has been provided for you to make arrangements for follow up care. Please return to the emergency department for any new or worsening symptoms.

## 2019-11-13 NOTE — ED Triage Notes (Signed)
Arrived by EMS from home. Patient reports she has history of Bursitis and it feel like her right hip locked up on her. Patient has appointment on Friday, but does not think she can wait until then to be seen

## 2019-11-13 NOTE — ED Provider Notes (Signed)
[facility] DEPT Provider Note   CSN: [id number] Arrival date & time: 11/13/19  0243     History Chief Complaint  Patient presents with  . Hip Pain    Stefanie Camacho is a 74 y.o. female.  HPI   74 year old female with a history of anxiety, hyperlipidemia, essential hypertension, GERD, poor thyroidism, left bundle branch block, nonischemic cardiomyopathy, sleep apnea, who presents the emergency department today complaining of right hip pain.  States that she has had right hip pain on and off for the last several months.  She was seen by orthopedics and diagnosed with bursitis and had an injection a couple months ago.  Initially had some improvement however recently his pain has been worse and her hip has been locking up on her.  Pain located to the right lateral hip and seems to radiate somewhat down the leg and into the groin.  She is now also having some left groin pain. She denies any numbness or weakness to the legs.  No loss control of bowel or bladder function.  Denies any fevers, joint swelling or redness.  Denies abdominal pain or urinary symptoms.  Denies any falls.  Past Medical History:  Diagnosis Date  . Anxiety   . Dyslipidemia   . Essential hypertension   . GERD (gastroesophageal reflux disease)   . Hypothyroidism   . Left bundle branch block 06/04/2018  . NICM (nonischemic cardiomyopathy) ([place])   . Nonischemic cardiomyopathy ([place]) 06/04/2018   Ejection fraction 45% in summer 2019  . Sleep apnea     Patient Active Problem List   Diagnosis Date Noted  . Vertigo 09/25/2019  . Sleep apnea   . GERD (gastroesophageal reflux disease)   . Anxiety   . NICM (nonischemic cardiomyopathy) ([place])   . Hypothyroidism   . Nonischemic cardiomyopathy ([place]) 06/04/2018  . Essential hypertension 06/04/2018  . Left bundle branch block 06/04/2018  . Dyslipidemia 06/04/2018    Past Surgical History:  Procedure Laterality Date  . ABDOMINAL HYSTERECTOMY     . BLADDER NECK RECONSTRUCTION    . SHOULDER SURGERY    . TONSILLECTOMY       OB History   No obstetric history on file.     Family History  Problem Relation Age of Onset  . Heart disease Mother   . Stroke Mother   . Heart disease Father   . Stroke Father   . Heart disease Maternal Grandfather   . Heart disease Paternal Grandfather     Social History   Tobacco Use  . Smoking status: Never Smoker  . Smokeless tobacco: Never Used  Vaping Use  . Vaping Use: Never used  Substance Use Topics  . Alcohol use: Not Currently  . Drug use: Never    Home Medications Prior to Admission medications   Medication Sig Start Date End Date Taking? Authorizing Provider  aspirin EC 81 MG tablet Take 81 mg by mouth daily.    [provider]  atorvastatin (LIPITOR) 10 MG tablet Take 1 tablet (10 mg total) by mouth daily at 6 PM. 10/13/19 01/11/20  Park Liter, MD  FLUoxetine (PROZAC) 10 MG tablet  09/04/19   [provider]  HYDROcodone-acetaminophen (NORCO/VICODIN) 5-325 MG tablet Take 1 tablet by mouth every 8 (eight) hours as needed. 11/13/19   Leslee Suire S, PA-C  levothyroxine (SYNTHROID, LEVOTHROID) 25 MCG tablet Take 1 tablet by mouth daily. Pt takes 1 and 1/2 daily 05/14/18   [provider]  losartan (COZAAR)  50 MG tablet Take 1 tablet (50 mg total) by mouth daily. 10/13/19   Park Liter, MD  metoprolol succinate (TOPROL-XL) 50 MG 24 hr tablet Take 1 tablet (50 mg total) by mouth daily. Take with or immediately following a meal. 10/13/19   Park Liter, MD  omeprazole (PRILOSEC) 20 MG capsule Take 20 mg by mouth daily.    [provider]  spironolactone (ALDACTONE) 25 MG tablet TAKE 1/2 TABLET BY MOUTH EVERY DAY 02/16/19   Park Liter, MD    Allergies    Patient has no known allergies.  Review of Systems   Review of Systems  Constitutional: Negative for fever.  HENT: Negative for ear pain and sore throat.   Eyes:  Negative for visual disturbance.  Respiratory: Negative for shortness of breath.   Cardiovascular: Negative for chest pain.  Gastrointestinal: Negative for abdominal pain, constipation, diarrhea, nausea and vomiting.  Genitourinary: Negative for dysuria and hematuria.  Musculoskeletal: Negative for back pain.       Right hip pain  Skin: Negative for rash.  Neurological: Negative for headaches.  All other systems reviewed and are negative.   Physical Exam Updated Vital Signs BP (!) 136/94 (BP Location: Right Arm)   Pulse (!) 51   Temp (!) 97.5 F (36.4 C) (Oral)   Resp 14   Ht 5\' 6"  (1.676 m)   Wt 89.4 kg   SpO2 94%   BMI 31.80 kg/m   Physical Exam Vitals and nursing note reviewed.  Constitutional:      General: She is not in acute distress.    Appearance: She is well-developed.  HENT:     Head: Normocephalic and atraumatic.  Eyes:     Conjunctiva/sclera: Conjunctivae normal.  Cardiovascular:     Rate and Rhythm: Normal rate and regular rhythm.  Pulmonary:     Effort: Pulmonary effort is normal.     Breath sounds: Normal breath sounds.  Abdominal:     General: Bowel sounds are normal.     Palpations: Abdomen is soft.     Tenderness: There is no abdominal tenderness. There is no guarding or rebound.  Musculoskeletal:     Cervical back: Neck supple.     Comments: TTP to the right lateral hip, proximal femur and right groin. No erythema, swelling, warmth to the joint. Able to range the joint without significant discomfort or difficulty. 5/5 strength to the BLE with normal sensation.  Skin:    General: Skin is warm and dry.  Neurological:     Mental Status: She is alert.     ED Results / Procedures / Treatments   Labs (all labs ordered are listed, but only abnormal results are displayed) Labs Reviewed  URINALYSIS, ROUTINE W REFLEX MICROSCOPIC - Abnormal; Notable for the following components:      Result Value   Leukocytes,Ua SMALL (*)    Bacteria, UA RARE (*)     All other components within normal limits    EKG None  Radiology DG Hip Unilat W or Wo Pelvis 2-3 Views Right  Result Date: 11/13/2019 CLINICAL DATA:  Right hip pain EXAM: DG HIP (WITH OR WITHOUT PELVIS) 2-3V RIGHT COMPARISON:  07/20/2019 FINDINGS: Pelvic ring is intact. Degenerative changes of the hip joints are noted bilaterally. No acute fracture or dislocation is noted. No soft tissue abnormality is seen. IMPRESSION: Degenerative change without acute abnormality. Electronically Signed   By: Inez Catalina M.D.   On: 11/13/2019 03:46    Procedures Procedures (  including critical care time)  Medications Ordered in ED Medications  lidocaine (LIDODERM) 5 % 1 patch (1 patch Transdermal Patch Applied 11/13/19 0420)  HYDROcodone-acetaminophen (NORCO/VICODIN) 5-325 MG per tablet 1 tablet (1 tablet Oral Given 11/13/19 0421)    ED Course  I have reviewed the triage vital signs and the nursing notes.  Pertinent labs & imaging results that were available during my care of the patient were reviewed by me and considered in my medical decision making (see chart for details).    MDM Rules/Calculators/A&P                          74 year old female with history of bursitis here complaining of right hip pain.  Taking Tylenol at home without relief.  Ongoing for several months and has had steroid injection into the joint several months ago.  Pain worsened tonight.  No evidence of septic arthritis clinically or by x-ray.  X-ray reveals degenerative changes.  Patient is neuro intact.  She does not have any other systemic complaints.  She was given a Lidoderm patch and pain medications and on reassessment she feels some improvement is ambulatory in the ED with steady gait.  She has a walker at home as well and I advised her to use this until she is feeling somewhat better on her feet.  She has an appoint with her orthopedist in 3 days.  Advised her I will give her Rx for some pain medicines for home.  Advised  on keeping appointment with orthopedics and return to the ED for new or worsening symptoms.  She voiced understanding of the plan and reasons to return.  Questions answered.  Patient stable for discharge.   Final Clinical Impression(s) / ED Diagnoses Final diagnoses:  Right hip pain    Rx / DC Orders ED Discharge Orders         Ordered    HYDROcodone-acetaminophen (NORCO/VICODIN) 5-325 MG tablet  Every 8 hours PRN     Discontinue  Reprint     11/13/19 0624           Rodney Booze, PA-C 11/13/19 [id number]    Ezequiel Essex, MD 11/13/19 [phone]

## 2019-11-16 ENCOUNTER — Ambulatory Visit (INDEPENDENT_AMBULATORY_CARE_PROVIDER_SITE_OTHER): Payer: Medicare HMO

## 2019-11-16 ENCOUNTER — Encounter: Payer: Self-pay | Admitting: Physician Assistant

## 2019-11-16 ENCOUNTER — Ambulatory Visit: Payer: Medicare HMO | Admitting: Physician Assistant

## 2019-11-16 ENCOUNTER — Other Ambulatory Visit: Payer: Self-pay

## 2019-11-16 DIAGNOSIS — M545 Low back pain, unspecified: Secondary | ICD-10-CM

## 2019-11-16 DIAGNOSIS — M7062 Trochanteric bursitis, left hip: Secondary | ICD-10-CM

## 2019-11-16 DIAGNOSIS — G8929 Other chronic pain: Secondary | ICD-10-CM | POA: Diagnosis not present

## 2019-11-16 DIAGNOSIS — M7061 Trochanteric bursitis, right hip: Secondary | ICD-10-CM | POA: Diagnosis not present

## 2019-11-16 MED ORDER — METHYLPREDNISOLONE 4 MG PO TABS: ORAL_TABLET | ORAL | 0 refills | Status: DC

## 2019-11-16 NOTE — Addendum Note (Signed)
Addended by: Michae Kava B on: 11/16/2019 12:44 PM   Modules accepted: Orders

## 2019-11-16 NOTE — Progress Notes (Signed)
Office Visit Note   Patient: Stefanie Camacho           Date of Birth: 01-08-1946           MRN: [id number] Visit Date: 11/16/2019              Requested by: [name], [place],  [place] [address] PCP: [name], MD   Assessment & Plan: Visit Diagnoses:  1. Chronic bilateral low back pain, unspecified whether sciatica present   2. Trochanteric bursitis of left hip   3. Trochanteric bursitis, right hip     Plan: Explained to Stefanie Camacho and her daughter-in-law who is present throughout examination today the given her radiographic and clinical findings will feel most likely that the source of her pain is coming from her low back.  Still feel that she has trochanteric bursitis now of both hips.  Recommend that we try Medrol Dosepak and physical therapy.  She did like an MRI of her lumbar spine rule out herniated disc as a source of her pain.  We will have her follow-up after the MRI to go over results discuss further treatment.  In the interim we will send her to formal physical therapy for her back, IT band stretching home exercise program, modalities and place her on the Medrol Dosepak.  Follow-Up Instructions: Return After MRI.   Orders:  Orders Placed This Encounter  Procedures  . XR Lumbar Spine 2-3 Views  . XR HIP UNILAT W OR W/O PELVIS 1V LEFT   Meds ordered this encounter  Medications  . methylPREDNISolone (MEDROL) 4 MG tablet    Sig: Take as directed    Dispense:  21 tablet    Refill:  0      Procedures: No procedures performed   Clinical Data: No additional findings.   Subjective: Chief Complaint  Patient presents with  . Lower Back - Pain  . Left Hip - Pain  . Right Hip - Pain    HPI Stefanie Camacho returns today due to now bilateral hip pain and low back pain.  She was seen 2 months ago was given a right trochanteric injection which gave her some minimal relief.  She continues to have bilateral groin pain and buttocks  pain.  She is also having low back pain.  She states that the left hip began to bother her about a month ago.  She states that her right hip locked up on 11/13/2019 labral unable to move she thought she had a stroke.  Went to the ER where radiographs of her pelvis and right hip were obtained.  These are personally reviewed and show no significant narrowing of the hip joints on the AP pelvis or of the right hip on the lateral view.  Mild degenerative changes of both hips.  Both hips well located no acute fracture. She does note that she seen a chiropractor and this is helped some.  She is having difficulty sleeping due to the pain having difficulty ambulating.  She states she has pain that radiates into both groin times that almost causes her to give way in regards to her legs.  She denies any numbness tingling down either leg. Review of Systems Negative for fever chills shortness of breath chest pain  Objective: Vital Signs: There were no vitals taken for this visit.  Physical Exam General: Well-developed well-nourished female no acute distress Psych: Alert and oriented x3 Vascular: Bilateral calves supple nontender dorsal pedal pulses are  2+ and equal and symmetric. Ortho Exam Lower extremity she has excellent range of motion of both hips without significant pain.  Tenderness over the trochanteric region of both hips.  Positive straight leg raise on the right negative on the left.  5 out of 5 strength throughout lower extremities against resistance.  Deep tendon reflex reflexes are 2+ at the knees and ankles and equal and symmetric.  Sensation grossly intact bilateral feet light touch.  She has limited forward flexion of the lumbar spine without pain and comes within 4 inches of touching her toes.  Good extension lumbar spine without pain.  Tenderness over the right paraspinous region of the lower lumbar. Specialty Comments:  No specialty comments available.  Imaging: XR HIP UNILAT W OR W/O PELVIS  1V LEFT  Result Date: 11/16/2019 Lateral view left hip: Shows the left hip to be well located.  No acute findings.  Minimal degenerative changes.  XR Lumbar Spine 2-3 Views  Result Date: 11/16/2019 Lumbar spine 2 views: Loss of lordotic curvature.  Degenerative changes throughout lumbar spine.  Slight scoliosis of the lower lumbar spine.  No spondylolisthesis no acute fractures or acute findings.    PMFS History: Patient Active Problem List   Diagnosis Date Noted  . Vertigo 09/25/2019  . Sleep apnea   . GERD (gastroesophageal reflux disease)   . Anxiety   . NICM (nonischemic cardiomyopathy) ([place])   . Hypothyroidism   . Nonischemic cardiomyopathy ([place]) 06/04/2018  . Essential hypertension 06/04/2018  . Left bundle branch block 06/04/2018  . Dyslipidemia 06/04/2018   Past Medical History:  Diagnosis Date  . Anxiety   . Dyslipidemia   . Essential hypertension   . GERD (gastroesophageal reflux disease)   . Hypothyroidism   . Left bundle branch block 06/04/2018  . NICM (nonischemic cardiomyopathy) ([place])   . Nonischemic cardiomyopathy ([place]) 06/04/2018   Ejection fraction 45% in summer 2019  . Sleep apnea     Family History  Problem Relation Age of Onset  . Heart disease Mother   . Stroke Mother   . Heart disease Father   . Stroke Father   . Heart disease Maternal Grandfather   . Heart disease Paternal Grandfather     Past Surgical History:  Procedure Laterality Date  . ABDOMINAL HYSTERECTOMY    . BLADDER NECK RECONSTRUCTION    . SHOULDER SURGERY    . TONSILLECTOMY     Social History   Occupational History  . Not on file  Tobacco Use  . Smoking status: Never Smoker  . Smokeless tobacco: Never Used  Vaping Use  . Vaping Use: Never used  Substance and Sexual Activity  . Alcohol use: Not Currently  . Drug use: Never  . Sexual activity: Not on file

## 2019-11-17 ENCOUNTER — Other Ambulatory Visit: Payer: Self-pay | Admitting: Cardiology

## 2019-11-17 MED ORDER — SPIRONOLACTONE 25 MG PO TABS: 12.5000 mg | ORAL_TABLET | Freq: Every day | ORAL | 3 refills | Status: DC

## 2019-11-17 NOTE — Telephone Encounter (Signed)
*  STAT* If patient is at the pharmacy, call can be transferred to refill team.   1. Which medications need to be refilled? (please list name of each medication and dose if known) spironolactone (ALDACTONE) 25 MG tablet  2. Which pharmacy/location (including street and city if local pharmacy) is medication to be sent to? [facility], [place]  3. Do they need a 30 day or 90 day supply? 90 day

## 2019-11-17 NOTE — Telephone Encounter (Signed)
Refill done per request

## 2019-11-23 DIAGNOSIS — E039 Hypothyroidism, unspecified: Secondary | ICD-10-CM | POA: Diagnosis not present

## 2019-11-30 ENCOUNTER — Other Ambulatory Visit: Payer: Self-pay

## 2019-11-30 ENCOUNTER — Ambulatory Visit: Payer: Medicare HMO | Attending: Physician Assistant | Admitting: Physical Therapy

## 2019-11-30 ENCOUNTER — Encounter: Payer: Self-pay | Admitting: Physical Therapy

## 2019-11-30 DIAGNOSIS — M545 Low back pain, unspecified: Secondary | ICD-10-CM

## 2019-11-30 DIAGNOSIS — M25552 Pain in left hip: Secondary | ICD-10-CM | POA: Insufficient documentation

## 2019-11-30 DIAGNOSIS — G8929 Other chronic pain: Secondary | ICD-10-CM

## 2019-11-30 DIAGNOSIS — M6281 Muscle weakness (generalized): Secondary | ICD-10-CM | POA: Insufficient documentation

## 2019-11-30 DIAGNOSIS — M25551 Pain in right hip: Secondary | ICD-10-CM | POA: Diagnosis not present

## 2019-11-30 DIAGNOSIS — R2689 Other abnormalities of gait and mobility: Secondary | ICD-10-CM | POA: Diagnosis not present

## 2019-11-30 NOTE — Therapy (Signed)
[place], [place], [address] Phone: [phone]   Fax:  [phone]  Physical Therapy Evaluation  Patient Details  Name: Stefanie Camacho MRN: [id number] Date of Birth: 01-09-1946 Referring Provider (PT): Pete Pelt, [place]   Encounter Date: 11/30/2019   PT End of Session - 11/30/19 8676    Visit Number 1    Number of Visits 8    Date for PT Re-Evaluation 01/25/20    Authorization Type [employer]    Progress Note Due on Visit 10    PT Start Time 0830    PT Stop Time 0915    PT Time Calculation (min) 45 min    Activity Tolerance Patient tolerated treatment well    Behavior During Therapy [facility] for tasks assessed/performed           Past Medical History:  Diagnosis Date  . Anxiety   . Dyslipidemia   . Essential hypertension   . GERD (gastroesophageal reflux disease)   . Hypothyroidism   . Left bundle branch block 06/04/2018  . NICM (nonischemic cardiomyopathy) ([place])   . Nonischemic cardiomyopathy ([place]) 06/04/2018   Ejection fraction 45% in summer 2019  . Sleep apnea     Past Surgical History:  Procedure Laterality Date  . ABDOMINAL HYSTERECTOMY    . BLADDER NECK RECONSTRUCTION    . SHOULDER SURGERY    . TONSILLECTOMY      There were no vitals filed for this visit.    Subjective Assessment - 11/30/19 0823    Subjective Patient reports pain in both groin areas for the past 2-3 months that can radiate down the front of both thighs and around the side of the right hip. She went to a chiropractor for the past 2-3 months and feels that did not help. She has a lot of pain when she stands from a sitting position and has trouble walking extended periods which limits her ability to walk her dog. About 2 weeks ago she went to the ER because she woke up and could barely move her right hip. She has had back pain all her life and has not been as active the past few years.    Limitations  Walking;Standing;House hold activities;Sitting    How long can you sit comfortably? No limitation    How long can you stand comfortably? 5-10 minutes    How long can you walk comfortably? A couple hundred feet    Diagnostic tests X-ray, MRI scheduled for 12/11/2019    Patient Stated Goals Get rid of pain, walk better    Currently in Pain? Yes    Pain Score 1    sharp pain 10/10   Pain Location Hip    Pain Orientation Right;Left;Anterior    Pain Descriptors / Indicators Aching;Sharp    Pain Type Chronic pain    Pain Radiating Towards right side radiates from groin around to buttock area    Pain Onset More than a month ago    Pain Frequency Intermittent    Aggravating Factors  Walking, standing from sitting postion    Pain Relieving Factors Tylenol, hot shower,    Effect of Pain on Daily Activities Patient limited in walking and going out due to fear of falling              [facility] PT Assessment - 11/30/19 0001      Assessment   Medical Diagnosis Chronic low back pain, bilateral hip pain    Referring Provider (  PT) Pete Pelt, PA-C    Onset Date/Surgical Date --   worsened over past 3 months   Hand Dominance Right    Next MD Visit 12/16/2019    Prior Therapy Yes      Precautions   Precautions None      Restrictions   Weight Bearing Restrictions No      Balance Screen   Has the patient fallen in the past 6 months No    Has the patient had a decrease in activity level because of a fear of falling?  Yes    Is the patient reluctant to leave their home because of a fear of falling?  No      Home Environment   Living Environment Private residence    Living Arrangements Alone    Type of [place] Access Stairs to enter    Entrance Stairs-Number of Steps 4 small steps    Entrance Stairs-Rails None    Home Layout One level      Prior Function   Level of Independence Independent      Cognition   Overall Cognitive Status Within Functional Limits for tasks  assessed      Observation/Other Assessments   Observations Patient appears in no apparent distress    Focus on Therapeutic Outcomes (FOTO)  Hip: 53% limitation, Lumbar: 49% limitation      Sensation   Light Touch Appears Intact      Coordination   Gross Motor Movements are Fluid and Coordinated Yes      ROM / Strength   AROM / PROM / Strength AROM;PROM;Strength      AROM   AROM Assessment Site Lumbar    Lumbar Flexion Fingertips to mid shin, increased right posterior hip pulling    Lumbar Extension 75%, increased bilateral low back discomfort    Lumbar - Right Side Bend 50%, increased right posterior hip pulling    Lumbar - Left Side Bend 50%, increased right posterior hip pulling    Lumbar - Right Rotation 50%    Lumbar - Left Rotation 50%      PROM   Overall PROM Comments Limitation with hip IR bilaterally    PROM Assessment Site Hip    Right/Left Hip Right;Left      Strength   Strength Assessment Site Hip;Knee    Right/Left Hip Right;Left    Right Hip Flexion 4-/5    Right Hip Extension 3-/5    Right Hip ABduction 3-/5    Left Hip Flexion 4-/5    Left Hip Extension 3-/5    Left Hip ABduction 3-/5    Right/Left Knee Right;Left    Right Knee Flexion 5/5    Right Knee Extension 5/5    Left Knee Flexion 5/5    Left Knee Extension 5/5      Flexibility   Soft Tissue Assessment /Muscle Length yes    Hamstrings Limited    Quadriceps Limited, hip flexor limitation    Piriformis Limited R>L, left anterior hip discomfort as well      Palpation   Spinal mobility Not assessed      Special Tests    Special Tests Lumbar;Hip Special Tests    Lumbar Tests Slump Test;Straight Leg Raise    Hip Special Tests  Saralyn Pilar (FABER) Test;Hip Scouring;Anterior Hip Impingement Test      Slump test   Findings Negative      Straight Leg Raise   Findings Negative  Saralyn Pilar [facility]) Test   Findings Positive      Hip Scouring   Findings Positive      Anterior Hip Impingement  Test    Findings Positive      Bed Mobility   Bed Mobility Sit to Supine    Sit to Supine --   patient has to use UE to lift left leg     Transfers   Transfers Independent with all Transfers      Ambulation/Gait   Ambulation/Gait Yes    Ambulation/Gait Assistance 7: Independent    Gait Comments Compensated trendelenburg bilaterally                       Objective measurements completed on examination: See above findings.       [facility] Adult PT Treatment/Exercise - 11/30/19 0001      Exercises   Exercises Knee/Hip;Lumbar      Lumbar Exercises: Stretches   Lower Trunk Rotation 5 reps;10 seconds      Knee/Hip Exercises: Stretches   Hip Flexor Stretch 2 reps;30 seconds    Hip Flexor Stretch Limitations supine edge of mat    Piriformis Stretch 2 reps;20 seconds    Piriformis Stretch Limitations supine      Knee/Hip Exercises: Supine   Heel Slides 10 reps    Bridges 10 reps   3 seconds   Straight Leg Raises Limitations Attempted SLR but patient unable to perform    Other Supine Knee/Hip Exercises Clamshell with red x10    Other Supine Knee/Hip Exercises Marching x10                  PT Education - 11/30/19 1308    Education Details Exam findings, POC, HEP    Person(s) Educated Patient    Methods Explanation;Demonstration;Tactile cues;Verbal cues;Handout    Comprehension Verbalized understanding;Returned demonstration;Verbal cues required;Tactile cues required;Need further instruction            PT Short Term Goals - 11/30/19 0840      PT SHORT TERM GOAL #1   Title Patient will be I with initial HEP to progress with PT    Time 4    Period Weeks    Status New    Target Date 12/28/19             PT Long Term Goals - 11/30/19 0841      PT LONG TERM GOAL #1   Title Patient will be I with final HEP to maintain progress from PT    Time 8    Period Weeks    Status New    Target Date 01/25/20      PT LONG TERM GOAL #2   Title Patient  will report improved functional level of hip to </= 45% limitation and low back to </= 42% limitation on FOTO    Time 8    Period Weeks    Status New    Target Date 01/25/20      PT LONG TERM GOAL #3   Title Patient will exhibit improved hip strength to grossly 4/5 MMT to improve ability to walk longer distances    Time 8    Period Weeks    Status New    Target Date 01/25/20      PT LONG TERM GOAL #4   Title Patient will report bilateral hip pain </= 3/10 with activity to improve ability to transition from sitting to standing without limitation.    Time  8    Period Weeks    Status New    Target Date 01/25/20                  Plan - 11/30/19 0165    Clinical Impression Statement Patient presents to PT with report of bilateral anterior hip pain, right typically worse than left, that will radiate to buttock on right side. Patient has long history of low back pain, but it is unclear whether current anterior hip symptoms are radicular in nature. Her current anterior hip pain seems more hip pathology related and she does exhibit limitations with hip mobility and gluteal weakness. She was provided HEP this visit to initiate strengthening and stretching with good tolerance. She would benefit from continued skilled PT to progress her hip mobility and strength to reduce pain and improve walking ability.    Personal Factors and Comorbidities Age;Fitness;Time since onset of injury/illness/exacerbation    Examination-Activity Limitations Locomotion Level;Stairs;Stand;Bed Mobility    Examination-Participation Restrictions Cleaning;Community Activity;Shop    Stability/Clinical Decision Making Stable/Uncomplicated    Clinical Decision Making Low    Rehab Potential Good    PT Frequency 1x / week    PT Duration 8 weeks    PT Treatment/Interventions ADLs/Self Care Home Management;Cryotherapy;Electrical Stimulation;Iontophoresis 4mg /ml Dexamethasone;Moist Heat;Neuromuscular re-education;Balance  training;Therapeutic exercise;Therapeutic activities;Functional mobility training;Stair training;Gait training;Patient/family education;Manual techniques;Dry needling;Passive range of motion;Spinal Manipulations;Joint Manipulations    PT Next Visit Plan Assess HEP and progress PRN, continue with hip mobility and stretching, progress gluteal and core strength, hip flexor strengthening    PT Home Exercise Plan [email or web address]: supine heel slide, marching, clamshell with red, bridge, piriformis stretch, LTR, supine hip flexor stretch edge of bed    Consulted and Agree with Plan of Care Patient           Patient will benefit from skilled therapeutic intervention in order to improve the following deficits and impairments:  Abnormal gait, Decreased range of motion, Difficulty walking, Decreased activity tolerance, Pain, Decreased strength  Visit Diagnosis: Pain in right hip  Pain in left hip  Chronic bilateral low back pain, unspecified whether sciatica present  Muscle weakness (generalized)  Other abnormalities of gait and mobility     Problem List Patient Active Problem List   Diagnosis Date Noted  . Vertigo 09/25/2019  . Sleep apnea   . GERD (gastroesophageal reflux disease)   . Anxiety   . NICM (nonischemic cardiomyopathy) ([place])   . Hypothyroidism   . Nonischemic cardiomyopathy ([place]) 06/04/2018  . Essential hypertension 06/04/2018  . Left bundle branch block 06/04/2018  . Dyslipidemia 06/04/2018    Hilda Blades, PT, DPT, LAT, ATC 11/30/19  2:22 PM Phone: [phone] Fax: [facility] [address] [place], [place], [address] Phone: [phone]   Fax:  [phone]  Name: Stefanie Camacho MRN: [id number] Date of Birth: [birth year]

## 2019-11-30 NOTE — Patient Instructions (Signed)
Access Code: [facility] URL: [email or web address] Date: 11/30/2019 Prepared by: Hilda Blades  Exercises Supine Heel Slide - 1 x daily - 7 x weekly - 2 sets - 10 reps Supine March - 1 x daily - 7 x weekly - 2 sets - 10 reps Bridge - 1 x daily - 7 x weekly - 2 sets - 10 reps Hooklying Clamshell with Resistance - 1 x daily - 7 x weekly - 2 sets - 10 reps Supine Piriformis Stretch with Foot on Ground - 1 x daily - 7 x weekly - 2 reps - 20 seconds hold Supine Lower Trunk Rotation - 1 x daily - 7 x weekly - 5 reps - 10 seconds hold Modified Thomas Stretch - 1 x daily - 7 x weekly - 2 reps - 60 seconds hold

## 2019-12-04 ENCOUNTER — Telehealth: Payer: Self-pay | Admitting: Physician Assistant

## 2019-12-04 NOTE — Telephone Encounter (Signed)
Pt called stating the tylenol isn't helping and pt would like a CB in regards to what medicine she can take with her heart complications.  [phone]

## 2019-12-07 ENCOUNTER — Ambulatory Visit
Admission: RE | Admit: 2019-12-07 | Discharge: 2019-12-07 | Disposition: A | Discharge status: Home / Self Care | Payer: Medicare HMO | Source: Ambulatory Visit | Attending: Physician Assistant | Admitting: Physician Assistant

## 2019-12-07 ENCOUNTER — Telehealth: Payer: Self-pay | Admitting: Physician Assistant

## 2019-12-07 ENCOUNTER — Ambulatory Visit: Payer: Medicare HMO | Attending: Physician Assistant

## 2019-12-07 ENCOUNTER — Other Ambulatory Visit: Payer: Self-pay

## 2019-12-07 DIAGNOSIS — M545 Low back pain, unspecified: Secondary | ICD-10-CM

## 2019-12-07 DIAGNOSIS — M25551 Pain in right hip: Secondary | ICD-10-CM | POA: Diagnosis not present

## 2019-12-07 DIAGNOSIS — G8929 Other chronic pain: Secondary | ICD-10-CM

## 2019-12-07 DIAGNOSIS — R2689 Other abnormalities of gait and mobility: Secondary | ICD-10-CM

## 2019-12-07 DIAGNOSIS — M48061 Spinal stenosis, lumbar region without neurogenic claudication: Secondary | ICD-10-CM | POA: Diagnosis not present

## 2019-12-07 DIAGNOSIS — M25552 Pain in left hip: Secondary | ICD-10-CM

## 2019-12-07 DIAGNOSIS — M6281 Muscle weakness (generalized): Secondary | ICD-10-CM

## 2019-12-07 MED ORDER — ACETAMINOPHEN-CODEINE #3 300-30 MG PO TABS: 1.0000 | ORAL_TABLET | Freq: Three times a day (TID) | ORAL | 0 refills | Status: DC | PRN

## 2019-12-07 MED ORDER — DIAZEPAM 5 MG PO TABS: 5.0000 mg | ORAL_TABLET | Freq: Once | ORAL | 0 refills | Status: AC

## 2019-12-07 NOTE — Therapy (Signed)
[place], [place], [address] Phone: [phone]   Fax:  [phone]  Physical Therapy Treatment  Patient Details  Name: Stefanie Camacho MRN: [id number] Date of Birth: March 16, 1946 Referring Provider (PT): Pete Pelt, [place]   Encounter Date: 12/07/2019   PT End of Session - 12/07/19 1941    Visit Number 2    Number of Visits 8    Date for PT Re-Evaluation 01/25/20    Authorization Type [employer] Medicare    Progress Note Due on Visit 10    PT Start Time 0930    PT Stop Time 1015    PT Time Calculation (min) 45 min    Activity Tolerance Patient tolerated treatment well    Behavior During Therapy [facility] for tasks assessed/performed           Past Medical History:  Diagnosis Date  . Anxiety   . Dyslipidemia   . Essential hypertension   . GERD (gastroesophageal reflux disease)   . Hypothyroidism   . Left bundle branch block 06/04/2018  . NICM (nonischemic cardiomyopathy) ([place])   . Nonischemic cardiomyopathy ([place]) 06/04/2018   Ejection fraction 45% in summer 2019  . Sleep apnea     Past Surgical History:  Procedure Laterality Date  . ABDOMINAL HYSTERECTOMY    . BLADDER NECK RECONSTRUCTION    . SHOULDER SURGERY    . TONSILLECTOMY      There were no vitals filed for this visit.   Subjective Assessment - 12/07/19 0934    Subjective Pt reports she was "back to the walker on Friday". She went to her son's for the weekend, so they could help take care of the dog. She did not move much there and upped her Tylenol due to increased pelvic pain. She upped from 500mg  to 1000mg , which eased pain some. she has trouble sleeping, getting up out of the bed/chair, turning over in bed is nearly impossible, and transitioning is a struggle. She felt really good last night for about an hour and thinks it was the medication helping, but then it returned.    Limitations Walking;Standing;House hold activities;Sitting    How  long can you sit comfortably? No limitation    How long can you stand comfortably? 5-10 minutes    How long can you walk comfortably? A couple hundred feet    Diagnostic tests X-ray, MRI scheduled for 12/11/2019    Patient Stated Goals Get rid of pain, walk better    Currently in Pain? Yes    Pain Score 5     Pain Location Groin    Pain Orientation Right    Pain Descriptors / Indicators Sharp    Pain Type Chronic pain    Pain Onset More than a month ago    Multiple Pain Sites Yes    Pain Score 5    Pain Location Back    Pain Orientation Left;Lower    Pain Descriptors / Indicators Aching    Pain Type Acute pain                             [facility] Adult PT Treatment/Exercise - 12/07/19 0001      Lumbar Exercises: Stretches   Lower Trunk Rotation Limitations 10x3 seconds each      Lumbar Exercises: Supine   Heel Slides 10 reps    Heel Slides Limitations with posterior pelvic tilt, digging heel down into mat  Knee/Hip Exercises: Aerobic   Nustep Lvl 4, 6 min, UE/LE      Knee/Hip Exercises: Standing   Other Standing Knee Exercises hip extension at sink (mini hinges)      Knee/Hip Exercises: Seated   Clamshell with TheraBand Red   15x3' w/slight PPT     Manual Therapy   Manual Therapy Manual Traction    Manual Traction Long axis distraction RLE grade 3   improved tolerance for R hip neutral with leg extended after                   PT Short Term Goals - 11/30/19 0840      PT SHORT TERM GOAL #1   Title Patient will be I with initial HEP to progress with PT    Time 4    Period Weeks    Status New    Target Date 12/28/19             PT Long Term Goals - 11/30/19 0841      PT LONG TERM GOAL #1   Title Patient will be I with final HEP to maintain progress from PT    Time 8    Period Weeks    Status New    Target Date 01/25/20      PT LONG TERM GOAL #2   Title Patient will report improved functional level of hip to </= 45%  limitation and low back to </= 42% limitation on FOTO    Time 8    Period Weeks    Status New    Target Date 01/25/20      PT LONG TERM GOAL #3   Title Patient will exhibit improved hip strength to grossly 4/5 MMT to improve ability to walk longer distances    Time 8    Period Weeks    Status New    Target Date 01/25/20      PT LONG TERM GOAL #4   Title Patient will report bilateral hip pain </= 3/10 with activity to improve ability to transition from sitting to standing without limitation.    Time 8    Period Weeks    Status New    Target Date 01/25/20                 Plan - 12/07/19 8756    Clinical Impression Statement Pt presents with higher pain level today in R anterior hip/groin and left low back, but tolerated session well with focus on slower and smaller movements. Connected concentric portion of exercises to exhales for improved core engagement and worked on neutral pelvis/posterior pelvic tilt to increase hip flexor length statically and during exercise. Educated pt to try some seated clams with PPT as tolerated and standing hip extension with small hip hinges at sink for hip flexor opening.    Personal Factors and Comorbidities Age;Fitness;Time since onset of injury/illness/exacerbation    Examination-Activity Limitations Locomotion Level;Stairs;Stand;Bed Mobility    Examination-Participation Restrictions Cleaning;Community Activity;Shop    Stability/Clinical Decision Making Stable/Uncomplicated    Rehab Potential Good    PT Frequency 1x / week    PT Duration 8 weeks    PT Treatment/Interventions ADLs/Self Care Home Management;Cryotherapy;Electrical Stimulation;Iontophoresis 4mg /ml Dexamethasone;Moist Heat;Neuromuscular re-education;Balance training;Therapeutic exercise;Therapeutic activities;Functional mobility training;Stair training;Gait training;Patient/family education;Manual techniques;Dry needling;Passive range of motion;Spinal Manipulations;Joint  Manipulations    PT Next Visit Plan Reassess HEP and progress PRN, continue with hip mobility and stretching, progress gluteal and core strength, hip flexor strengthening  PT Home Exercise Plan [email or web address]: supine heel slide, marching, clamshell with red, bridge, piriformis stretch, LTR, supine hip flexor stretch edge of bed    Consulted and Agree with Plan of Care Patient           Patient will benefit from skilled therapeutic intervention in order to improve the following deficits and impairments:  Abnormal gait, Decreased range of motion, Difficulty walking, Decreased activity tolerance, Pain, Decreased strength  Visit Diagnosis: Pain in right hip  Pain in left hip  Chronic bilateral low back pain, unspecified whether sciatica present  Muscle weakness (generalized)  Other abnormalities of gait and mobility     Problem List Patient Active Problem List   Diagnosis Date Noted  . Vertigo 09/25/2019  . Sleep apnea   . GERD (gastroesophageal reflux disease)   . Anxiety   . NICM (nonischemic cardiomyopathy) ([place])   . Hypothyroidism   . Nonischemic cardiomyopathy ([place]) 06/04/2018  . Essential hypertension 06/04/2018  . Left bundle branch block 06/04/2018  . Dyslipidemia 06/04/2018    Izell Mansfield, PT, DPT 12/07/2019, 10:17 AM  [facility] [address] [place], [place], [address] Phone: [phone]   Fax:  [phone]  Name: Stefanie Camacho MRN: [id number] Date of Birth: [birth year]

## 2019-12-07 NOTE — Telephone Encounter (Signed)
See below

## 2019-12-07 NOTE — Telephone Encounter (Signed)
She is now over 3 months out from her surgery.  I will prescribe some Tylenol with codeine in it but not hydrocodone at this point because we need to try to wean her off of pain medications.

## 2019-12-07 NOTE — Telephone Encounter (Signed)
Patient is ok with sending in Tylenol with codeine.  Patient also requests valium for MRI that is scheduled for 5:00 today, did not see this sent in.

## 2019-12-07 NOTE — Telephone Encounter (Signed)
Patient called requesting pain medication. Tylenol is not touching the pain and would like a call back from PA [place] about this matter. Please send to pharmacy on file [place] on [address]. Patient phone number is [phone]. Patient also has an MRI appt and need a volume to keep nerves calm. Please send both meds to [facility]. Message being sent high priority.

## 2019-12-07 NOTE — Telephone Encounter (Signed)
Duplicate message. Message has already been sent to Dr. Ninfa Linden.

## 2019-12-07 NOTE — Telephone Encounter (Signed)
Please advise 

## 2019-12-11 ENCOUNTER — Other Ambulatory Visit: Payer: Medicare HMO

## 2019-12-14 ENCOUNTER — Ambulatory Visit: Payer: Medicare HMO | Admitting: Physical Therapy

## 2019-12-14 ENCOUNTER — Encounter: Payer: Self-pay | Admitting: Physical Therapy

## 2019-12-14 ENCOUNTER — Other Ambulatory Visit: Payer: Self-pay

## 2019-12-14 DIAGNOSIS — G8929 Other chronic pain: Secondary | ICD-10-CM

## 2019-12-14 DIAGNOSIS — M545 Low back pain, unspecified: Secondary | ICD-10-CM

## 2019-12-14 DIAGNOSIS — M6281 Muscle weakness (generalized): Secondary | ICD-10-CM

## 2019-12-14 DIAGNOSIS — M25552 Pain in left hip: Secondary | ICD-10-CM | POA: Diagnosis not present

## 2019-12-14 DIAGNOSIS — M25551 Pain in right hip: Secondary | ICD-10-CM

## 2019-12-14 DIAGNOSIS — R2689 Other abnormalities of gait and mobility: Secondary | ICD-10-CM | POA: Diagnosis not present

## 2019-12-14 NOTE — Patient Instructions (Signed)
Access Code: [facility] URL: [email or web address] Date: 12/14/2019 Prepared by: Hilda Blades  Exercises Supine March - 1 x daily - 7 x weekly - 2 sets - 10 reps Bridge - 1 x daily - 7 x weekly - 2 sets - 10 reps Hooklying Clamshell with Resistance - 1 x daily - 7 x weekly - 2 sets - 10 reps Supine Lower Trunk Rotation - 1 x daily - 7 x weekly - 5 reps - 10 seconds hold Standing Pelvic Tilt - 1 x daily - 7 x weekly - 2 sets - 10 reps

## 2019-12-14 NOTE — Therapy (Signed)
[place], [place], [address] Phone: [phone]   Fax:  [phone]  Physical Therapy Treatment  Patient Details  Name: Stefanie Camacho MRN: [id number] Date of Birth: 03-27-1946 Referring Provider (PT): Pete Pelt, [place]   Encounter Date: 12/14/2019   PT End of Session - 12/14/19 1010    Visit Number 3    Number of Visits 8    Date for PT Re-Evaluation 01/25/20    Authorization Type [employer] Medicare    Progress Note Due on Visit 10    PT Start Time 1002    PT Stop Time 1042    PT Time Calculation (min) 40 min    Activity Tolerance Patient tolerated treatment well    Behavior During Therapy [facility] for tasks assessed/performed           Past Medical History:  Diagnosis Date  . Anxiety   . Dyslipidemia   . Essential hypertension   . GERD (gastroesophageal reflux disease)   . Hypothyroidism   . Left bundle branch block 06/04/2018  . NICM (nonischemic cardiomyopathy) ([place])   . Nonischemic cardiomyopathy ([place]) 06/04/2018   Ejection fraction 45% in summer 2019  . Sleep apnea     Past Surgical History:  Procedure Laterality Date  . ABDOMINAL HYSTERECTOMY    . BLADDER NECK RECONSTRUCTION    . SHOULDER SURGERY    . TONSILLECTOMY      There were no vitals filed for this visit.   Subjective Assessment - 12/14/19 1002    Subjective Patient reports she had her MRI and goes to see her doctor on 12/16/2019. She states her pain has been up and down, and she feels the exercises make her sore the next day. She states that the hip pain has pretty much let up, just some achiness on the right side with standing/walking. Currently most of her pain is middle of low back that radiates into bilateral upper buttocks. She feels that sitting and napping in her recliner could be contributing to her back pain.    Patient Stated Goals Get rid of pain, walk better    Currently in Pain? Yes    Pain Score 3     Pain Location  Back    Pain Orientation Lower    Pain Descriptors / Indicators Aching;Sore    Pain Type Chronic pain    Pain Onset More than a month ago    Pain Frequency Intermittent              [facility] PT Assessment - 12/14/19 0001      AROM   Lumbar Flexion Fingertips to mid shin, increased midline low back and bilateral upper buttock pain    Lumbar Extension 75%, increased midline low back discomfort                         [facility] Adult PT Treatment/Exercise - 12/14/19 0001      Self-Care   Self-Care Posture    Posture Using lumbar roll to improve lumbar positioning while seated in recliner      Exercises   Exercises Knee/Hip;Lumbar      Lumbar Exercises: Stretches   Lower Trunk Rotation 5 reps;10 seconds    Hip Flexor Stretch Limitations Patient reports groin/anterior hip pain so d/c'd    Pelvic Tilt 5 reps    Piriformis Stretch Limitations Trialed supine but patient reported groin pain so d/c'd      Lumbar Exercises:  Aerobic   Nustep L3 x 5 min (UE and LE)      Lumbar Exercises: Standing   Other Standing Lumbar Exercises Standing pelvic tilts at sink      Lumbar Exercises: Supine   Bent Knee Raise 10 reps    Bent Knee Raise Limitations marching    Bridge 10 reps      Manual Therapy   Manual Therapy Manual Traction    Manual Traction LAD left LE                  PT Education - 12/14/19 1009    Education Details HEP    Person(s) Educated Patient    Methods Explanation;Demonstration;Verbal cues;Handout;Tactile cues    Comprehension Verbalized understanding;Returned demonstration;Verbal cues required;Tactile cues required;Need further instruction            PT Short Term Goals - 11/30/19 0840      PT SHORT TERM GOAL #1   Title Patient will be I with initial HEP to progress with PT    Time 4    Period Weeks    Status New    Target Date 12/28/19             PT Long Term Goals - 11/30/19 0841      PT LONG TERM GOAL #1   Title Patient  will be I with final HEP to maintain progress from PT    Time 8    Period Weeks    Status New    Target Date 01/25/20      PT LONG TERM GOAL #2   Title Patient will report improved functional level of hip to </= 45% limitation and low back to </= 42% limitation on FOTO    Time 8    Period Weeks    Status New    Target Date 01/25/20      PT LONG TERM GOAL #3   Title Patient will exhibit improved hip strength to grossly 4/5 MMT to improve ability to walk longer distances    Time 8    Period Weeks    Status New    Target Date 01/25/20      PT LONG TERM GOAL #4   Title Patient will report bilateral hip pain </= 3/10 with activity to improve ability to transition from sitting to standing without limitation.    Time 8    Period Weeks    Status New    Target Date 01/25/20                 Plan - 12/14/19 1010    Clinical Impression Statement Patient tolerated therapy well with no adverse effects. This visit she was reporting more lower back and bilateraly buttock discomfort that seems radicular in nature, but she does continue to report groin pain that seems hip joint related. Her HEP was modified to discontinue exercises that aggravate her groin such as hip flexor and piriformis stretch. She was encouraged to use lumbar roll while sitting in recliner as lumbar flexion does exacerbate her symptoms. She would benefit from continued skilled PT to progress her hip mobility and strength to reduce pain and improve walking ability.    PT Treatment/Interventions ADLs/Self Care Home Management;Cryotherapy;Electrical Stimulation;Iontophoresis 4mg /ml Dexamethasone;Moist Heat;Neuromuscular re-education;Balance training;Therapeutic exercise;Therapeutic activities;Functional mobility training;Stair training;Gait training;Patient/family education;Manual techniques;Dry needling;Passive range of motion;Spinal Manipulations;Joint Manipulations    PT Next Visit Plan Reassess HEP and progress PRN,  continue with hip mobility and stretching, progress gluteal and core strength, hip flexor  strengthening    PT Home Exercise Plan [id number]: marching, clamshell with red, bridge, LTR, standing pelvic tilts    Consulted and Agree with Plan of Care Patient           Patient will benefit from skilled therapeutic intervention in order to improve the following deficits and impairments:  Abnormal gait, Decreased range of motion, Difficulty walking, Decreased activity tolerance, Pain, Decreased strength  Visit Diagnosis: Chronic bilateral low back pain, unspecified whether sciatica present  Pain in right hip  Pain in left hip  Muscle weakness (generalized)  Other abnormalities of gait and mobility     Problem List Patient Active Problem List   Diagnosis Date Noted  . Vertigo 09/25/2019  . Sleep apnea   . GERD (gastroesophageal reflux disease)   . Anxiety   . NICM (nonischemic cardiomyopathy) ([place])   . Hypothyroidism   . Nonischemic cardiomyopathy ([place]) 06/04/2018  . Essential hypertension 06/04/2018  . Left bundle branch block 06/04/2018  . Dyslipidemia 06/04/2018    Hilda Blades, PT, DPT, LAT, ATC 12/14/19  10:59 AM Phone: [phone] Fax: [facility] [address] [place], [place], [address] Phone: [phone]   Fax:  [phone]  Name: Lessie Funderburke MRN: [id number] Date of Birth: 1945/10/18

## 2019-12-16 ENCOUNTER — Encounter: Payer: Self-pay | Admitting: Orthopaedic Surgery

## 2019-12-16 ENCOUNTER — Ambulatory Visit: Payer: Medicare HMO | Admitting: Orthopaedic Surgery

## 2019-12-16 DIAGNOSIS — G8929 Other chronic pain: Secondary | ICD-10-CM

## 2019-12-16 DIAGNOSIS — M5441 Lumbago with sciatica, right side: Secondary | ICD-10-CM | POA: Diagnosis not present

## 2019-12-16 DIAGNOSIS — M5442 Lumbago with sciatica, left side: Secondary | ICD-10-CM | POA: Diagnosis not present

## 2019-12-16 MED ORDER — GABAPENTIN 300 MG PO CAPS
300.0000 mg | ORAL_CAPSULE | Freq: Every day | ORAL | 1 refills | Status: DC
Start: 2019-12-16 — End: 2020-01-13

## 2019-12-16 NOTE — Progress Notes (Signed)
The patient comes in today to go over an MRI of her lumbar spine.  She is having significant sciatica with radicular symptoms.  This is been slowly getting worse.  We did place her on a steroid taper which helped temporize her symptoms while we awaited this MRI.  She is still describing the same pain in her sciatic region on both sides in her thighs on both sides.  On exam I can still easily put both hips through internal and external rotation.  She does have a positive straight leg raise bilaterally and walks with a limp.  She has pretty good strength in her feet.  The MRI is reviewed with her.  I showed her a spine model.  She has moderate to severe stenosis at L4-L5 and L5-S1 that is foraminal stenosis worse on the right than left but certainly quite significant.  There is also likely impingement of the right L4 nerve root.  Her MRI findings certainly correlate with her clinical exam findings.  At this point I would like to send her to Dr. Lorin Mercy for an opinion as it relates to whether or not she would benefit from surgical intervention on her spine versus just injections.  Given the severity of her foraminal stenosis and her clinical exam, surgery may be warranted.  She understands the that will certainly be up to Dr. Lorin Mercy who is a spine specialist.  All questions and concerns were answered and addressed.  We will work on getting an appointment with Dr. Lorin Mercy.

## 2019-12-21 ENCOUNTER — Encounter: Payer: Self-pay | Admitting: Physical Therapy

## 2019-12-21 ENCOUNTER — Other Ambulatory Visit: Payer: Self-pay

## 2019-12-21 ENCOUNTER — Ambulatory Visit: Payer: Medicare HMO | Admitting: Physical Therapy

## 2019-12-21 DIAGNOSIS — M6281 Muscle weakness (generalized): Secondary | ICD-10-CM | POA: Diagnosis not present

## 2019-12-21 DIAGNOSIS — R2689 Other abnormalities of gait and mobility: Secondary | ICD-10-CM

## 2019-12-21 DIAGNOSIS — M25552 Pain in left hip: Secondary | ICD-10-CM

## 2019-12-21 DIAGNOSIS — M25551 Pain in right hip: Secondary | ICD-10-CM | POA: Diagnosis not present

## 2019-12-21 DIAGNOSIS — M545 Low back pain, unspecified: Secondary | ICD-10-CM

## 2019-12-21 DIAGNOSIS — G8929 Other chronic pain: Secondary | ICD-10-CM | POA: Diagnosis not present

## 2019-12-21 NOTE — Therapy (Addendum)
[place], [place], [address] Phone: [phone]   Fax:  [phone]  Physical Therapy Treatment / Discharge  Patient Details  Name: Stefanie Camacho MRN: [id number] Date of Birth: 1946-03-18 Referring Provider (PT): Pete Pelt, [place]   Encounter Date: 12/21/2019   PT End of Session - 12/21/19 1008    Visit Number 4    Number of Visits 8    Date for PT Re-Evaluation 01/25/20    Authorization Type [employer] Medicare    Progress Note Due on Visit 10    PT Start Time 1000    PT Stop Time 1045   5 min MHP   PT Time Calculation (min) 45 min    Activity Tolerance Patient tolerated treatment well    Behavior During Therapy [facility] for tasks assessed/performed           Past Medical History:  Diagnosis Date  . Anxiety   . Dyslipidemia   . Essential hypertension   . GERD (gastroesophageal reflux disease)   . Hypothyroidism   . Left bundle branch block 06/04/2018  . NICM (nonischemic cardiomyopathy) ([place])   . Nonischemic cardiomyopathy ([place]) 06/04/2018   Ejection fraction 45% in summer 2019  . Sleep apnea     Past Surgical History:  Procedure Laterality Date  . ABDOMINAL HYSTERECTOMY    . BLADDER NECK RECONSTRUCTION    . SHOULDER SURGERY    . TONSILLECTOMY      There were no vitals filed for this visit.   Subjective Assessment - 12/21/19 1003    Subjective Patient reports increased pain since Friday, 12/18/2019, where her right hip just started achine and since then it has just gotten worse. She is using her walker due to increased pain with walking. She did see Dr. Ninfa Linden who reviewed her MRI and referred her to spine surgeon who she seems next week.    Patient Stated Goals Get rid of pain, walk better    Currently in Pain? Yes    Pain Score 7     Pain Location Back    Pain Orientation Lower    Pain Descriptors / Indicators Aching    Pain Type Chronic pain    Pain Radiating Towards right posterior  hip    Pain Onset More than a month ago    Pain Frequency Intermittent              [facility] PT Assessment - 12/21/19 0001      Assessment   Next MD Visit 12/30/2019                         [facility] Adult PT Treatment/Exercise - 12/21/19 0001      Exercises   Exercises Knee/Hip;Lumbar      Lumbar Exercises: Stretches   Single Knee to Chest Stretch 2 reps;20 seconds    Single Knee to Chest Stretch Limitations manually performed to avoid groin pain    Lower Trunk Rotation 3 reps;10 seconds    Piriformis Stretch 3 reps;20 seconds    Piriformis Stretch Limitations manually performed to avoid groin pain      Lumbar Exercises: Aerobic   Nustep L3 x 5 min (LE only)      Modalities   Modalities Moist Heat      Moist Heat Therapy   Number Minutes Moist Heat 5 Minutes    Moist Heat Location Hip   right posterior hip and lumbar region  Manual Therapy   Manual Therapy Soft tissue mobilization;Manual Traction    Soft tissue mobilization Right gluteal and lumbar paraspinal with roller    Manual Traction LAD to right LE                  PT Education - 12/21/19 1007    Education Details HEP    Person(s) Educated Patient    Methods Explanation    Comprehension Verbalized understanding            PT Short Term Goals - 11/30/19 0840      PT SHORT TERM GOAL #1   Title Patient will be I with initial HEP to progress with PT    Time 4    Period Weeks    Status New    Target Date 12/28/19             PT Long Term Goals - 11/30/19 0841      PT LONG TERM GOAL #1   Title Patient will be I with final HEP to maintain progress from PT    Time 8    Period Weeks    Status New    Target Date 01/25/20      PT LONG TERM GOAL #2   Title Patient will report improved functional level of hip to </= 45% limitation and low back to </= 42% limitation on FOTO    Time 8    Period Weeks    Status New    Target Date 01/25/20      PT LONG TERM GOAL #3   Title  Patient will exhibit improved hip strength to grossly 4/5 MMT to improve ability to walk longer distances    Time 8    Period Weeks    Status New    Target Date 01/25/20      PT LONG TERM GOAL #4   Title Patient will report bilateral hip pain </= 3/10 with activity to improve ability to transition from sitting to standing without limitation.    Time 8    Period Weeks    Status New    Target Date 01/25/20                 Plan - 12/21/19 1008    Clinical Impression Statement She did not tolerate therapy well due to increased right posterior hip and bilateral groin pain. She was using a RW and exhibits antalgic gait on right. Focused mainly on manual therapy and light stretching to reduce discomfort. Patient did report benefit for right posterior hip but reports continued groin pain. Her groin pain does seem to be more hip related while her back posterior hip, and thigh pain seems to be radicular pain. She was encouraged to perform HEP as tolerated and continue to move frequently during the day. She would benefit from continued skilled PT to progress her hip mobility and strength to reduce pain and improve walking ability.    PT Treatment/Interventions ADLs/Self Care Home Management;Cryotherapy;Electrical Stimulation;Iontophoresis 7m/ml Dexamethasone;Moist Heat;Neuromuscular re-education;Balance training;Therapeutic exercise;Therapeutic activities;Functional mobility training;Stair training;Gait training;Patient/family education;Manual techniques;Dry needling;Passive range of motion;Spinal Manipulations;Joint Manipulations    PT Next Visit Plan Reassess HEP and progress PRN, continue with hip mobility and stretching, progress gluteal and core strength, hip flexor strengthening    PT Home Exercise Plan [id number]: marching, clamshell with red, bridge, LTR, standing pelvic tilts    Consulted and Agree with Plan of Care Patient           Patient will benefit  from skilled therapeutic  intervention in order to improve the following deficits and impairments:  Abnormal gait, Decreased range of motion, Difficulty walking, Decreased activity tolerance, Pain, Decreased strength  Visit Diagnosis: Chronic bilateral low back pain, unspecified whether sciatica present  Pain in right hip  Pain in left hip  Muscle weakness (generalized)  Other abnormalities of gait and mobility     Problem List Patient Active Problem List   Diagnosis Date Noted  . Vertigo 09/25/2019  . Sleep apnea   . GERD (gastroesophageal reflux disease)   . Anxiety   . NICM (nonischemic cardiomyopathy) ([place])   . Hypothyroidism   . Nonischemic cardiomyopathy ([place]) 06/04/2018  . Essential hypertension 06/04/2018  . Left bundle branch block 06/04/2018  . Dyslipidemia 06/04/2018    Hilda Blades, PT, DPT, LAT, ATC 12/21/19  11:53 AM Phone: [phone] Fax: [facility] [place], [place], [address] Phone: [phone]   Fax:  [phone]  Name: Stefanie Camacho MRN: [id number] Date of Birth: [birth year]    PHYSICAL THERAPY DISCHARGE SUMMARY  Visits from Start of Care: 4  Current functional level related to goals / functional outcomes: See above   Remaining deficits: See above   Education / Equipment: HEP  Plan: Patient agrees to discharge.  Patient goals were not met. Patient is being discharged due to not returning since the last visit.  ?????    Hilda Blades, PT, DPT, LAT, ATC 02/15/20  8:44 AM Phone: [phone] Fax: [phone]

## 2019-12-28 ENCOUNTER — Ambulatory Visit: Payer: Medicare HMO | Admitting: Physical Therapy

## 2019-12-30 ENCOUNTER — Encounter: Payer: Self-pay | Admitting: Orthopaedic Surgery

## 2019-12-30 ENCOUNTER — Ambulatory Visit: Payer: Medicare HMO | Admitting: Orthopaedic Surgery

## 2019-12-30 VITALS — BP 125/70 | HR 62 | Ht 66.0 in | Wt 198.0 lb

## 2019-12-30 DIAGNOSIS — M5441 Lumbago with sciatica, right side: Secondary | ICD-10-CM

## 2019-12-30 DIAGNOSIS — M5442 Lumbago with sciatica, left side: Secondary | ICD-10-CM | POA: Diagnosis not present

## 2019-12-30 DIAGNOSIS — M48061 Spinal stenosis, lumbar region without neurogenic claudication: Secondary | ICD-10-CM

## 2019-12-30 DIAGNOSIS — G8929 Other chronic pain: Secondary | ICD-10-CM

## 2019-12-30 HISTORY — DX: Spinal stenosis, lumbar region without neurogenic claudication: M48.061

## 2019-12-30 NOTE — Progress Notes (Signed)
Office Visit Note   Patient: Stefanie Camacho           Date of Birth: 04-03-1946           MRN: [id number] Visit Date: 12/30/2019              Requested by: [name], [place],  [place] [address] PCP: [name], MD   Assessment & Plan: Visit Diagnoses:  1. Chronic bilateral low back pain with bilateral sciatica   2. Lumbar foraminal stenosis     Plan: We will have her see Dr. Laurence Spates for consideration of epidural steroid injection either midline or foraminal.  She has some bilateral groin pain referred pain.  Sometimes more on the right sometimes left.  She does have foraminal stenosis but has had some pain similar to this off and on but not lasting as long as severe.  We will set up in epic epidural injection and she can return to see me in 4 weeks.  Pathophysiology discussed for get work-up with report we reviewed the MRI images.  Follow-Up Instructions: No follow-ups on file.   Orders:  Orders Placed This Encounter  Procedures  . Ambulatory referral to Physical Medicine Rehab   No orders of the defined types were placed in this encounter.     Procedures: No procedures performed   Clinical Data: No additional findings.   Subjective: Chief Complaint  Patient presents with  . Lower Back - Pain    HPI 74 year old female referred by Dr. Zollie Beckers for low back symptoms and consideration of possible intervention surgically.  She has had back pain some pain in her buttocks and in her groin.  Pain tends to come and go.  She has had some pain in her anterior thigh really does not radiate down to her feet she is used some Tylenol 3.  She has been going to therapy has not really noted relief.  This been present for 3 months.  She has been having to use a walker.  She states she has had back pain symptoms off and on for years but this is worse than it had been.  Lumbar MRI scan was obtained 12/08/2019 which shows severe right and  moderate left foraminal stenosis.  L4-5 moderate right foraminal stenosis and some extraforaminal disc at L4-5 on the right.  No central stenosis.  Patient states several months ago she is community ambulator with [employer] did not use any walking aids and was doing just fine.  Patient does have problems with nonischemic cardiomyopathy, hypertension.  Hypothyroidism GERD sleep apnea anxiety.  She has been on some Neurontin and also taken some hydrocodone for pain.  Review of Systems all other systems are negative other than as mentioned above 14 point update.   Objective: Vital Signs: BP 125/70   Pulse 62   Ht 5\' 6"  (1.676 m)   Wt 198 lb (89.8 kg)   BMI 31.96 kg/m   Physical Exam Constitutional:      Appearance: She is well-developed.  HENT:     Head: Normocephalic.     Right Ear: External ear normal.     Left Ear: External ear normal.  Eyes:     Pupils: Pupils are equal, round, and reactive to light.  Neck:     Thyroid: No thyromegaly.     Trachea: No tracheal deviation.  Cardiovascular:     Rate and Rhythm: Normal rate.  Pulmonary:     Effort:  Pulmonary effort is normal.  Abdominal:     Palpations: Abdomen is soft.  Skin:    General: Skin is warm and dry.  Neurological:     Mental Status: She is alert and oriented to person, place, and time.  Psychiatric:        Behavior: Behavior normal.     Ortho Exam patient has minimal discomfort internal rotation right left hip 20 to 30 degrees both without significant groin pain.  Some sciatic notch tenderness.  Mild discomfort with straight leg raising at 90 degrees.  Knee and ankle jerk are intact anterior tib is intact she is slow with ambulation and can ambulate some without the walker. Specialty Comments:  No specialty comments available.  Imaging: CLINICAL DATA:  Low back pain radiating to both lower extremities  EXAM: MRI LUMBAR SPINE WITHOUT CONTRAST  TECHNIQUE: Multiplanar, multisequence MR imaging of the  lumbar spine was performed. No intravenous contrast was administered.  COMPARISON:  None.  FINDINGS: Segmentation:  Standard.  Alignment:  Physiologic.  Vertebrae:  No fracture, evidence of discitis, or bone lesion.  Conus medullaris and cauda equina: Conus extends to the L2 level. Conus and cauda equina appear normal.  Paraspinal and other soft tissues: Negative.  Disc levels:  L1-L2: Normal disc space and facet joints. There is no spinal canal stenosis. No neural foraminal stenosis.  L2-L3: Small disc bulge. There is no spinal canal stenosis. No neural foraminal stenosis.  L3-L4: Disc space narrowing with small bulge. There is no spinal canal stenosis. Mild bilateral neural foraminal stenosis.  L4-L5: Right asymmetric disc bulge. There is no spinal canal stenosis. Moderate right neural foraminal stenosis. Extraforaminal component of the disc may contact the exiting right L4 root.  L5-S1: Disc space narrowing with endplate spurring and small bulge. Moderate facet hypertrophy. There is no spinal canal stenosis. Severe right and moderate left neural foraminal stenosis.  Visualized sacrum: Normal.  IMPRESSION: 1. L5-S1 severe right and moderate left neural foraminal stenosis. 2. L4-L5 moderate right neural foraminal stenosis. Extraforaminal component of the disc may contact the exiting right L4 root. 3. No spinal canal stenosis.   Electronically Signed   By: Ulyses Jarred M.D.   On: 12/08/2019 02:20   PMFS History: Patient Active Problem List   Diagnosis Date Noted  . Lumbar foraminal stenosis 12/30/2019  . Vertigo 09/25/2019  . Sleep apnea   . GERD (gastroesophageal reflux disease)   . Anxiety   . NICM (nonischemic cardiomyopathy) ([place])   . Hypothyroidism   . Nonischemic cardiomyopathy ([place]) 06/04/2018  . Essential hypertension 06/04/2018  . Left bundle branch block 06/04/2018  . Dyslipidemia 06/04/2018   Past Medical History:   Diagnosis Date  . Anxiety   . Dyslipidemia   . Essential hypertension   . GERD (gastroesophageal reflux disease)   . Hypothyroidism   . Left bundle branch block 06/04/2018  . NICM (nonischemic cardiomyopathy) ([place])   . Nonischemic cardiomyopathy ([place]) 06/04/2018   Ejection fraction 45% in summer 2019  . Sleep apnea     Family History  Problem Relation Age of Onset  . Heart disease Mother   . Stroke Mother   . Heart disease Father   . Stroke Father   . Heart disease Maternal Grandfather   . Heart disease Paternal Grandfather     Past Surgical History:  Procedure Laterality Date  . ABDOMINAL HYSTERECTOMY    . BLADDER NECK RECONSTRUCTION    . SHOULDER SURGERY    . TONSILLECTOMY  Social History   Occupational History  . Not on file  Tobacco Use  . Smoking status: Never Smoker  . Smokeless tobacco: Never Used  Vaping Use  . Vaping Use: Never used  Substance and Sexual Activity  . Alcohol use: Not Currently  . Drug use: Never  . Sexual activity: Not on file

## 2019-12-31 ENCOUNTER — Telehealth: Payer: Self-pay | Admitting: Orthopaedic Surgery

## 2019-12-31 MED ORDER — ACETAMINOPHEN-CODEINE #3 300-30 MG PO TABS: 1.0000 | ORAL_TABLET | Freq: Three times a day (TID) | ORAL | 0 refills | Status: DC | PRN

## 2019-12-31 NOTE — Telephone Encounter (Signed)
Refill called to pharmacy. I called patient and advised.

## 2019-12-31 NOTE — Telephone Encounter (Signed)
Please advise 

## 2019-12-31 NOTE — Telephone Encounter (Signed)
OK refill thanks 

## 2019-12-31 NOTE — Telephone Encounter (Signed)
Patient called requesting a refill of Tylenol 3. Please send to pharmacy [facility] on [address]. Patient phone number is [phone].

## 2020-01-04 ENCOUNTER — Telehealth: Payer: Self-pay | Admitting: Orthopaedic Surgery

## 2020-01-04 NOTE — Telephone Encounter (Signed)
Patient called needing to schedule an appointment with Dr Ernestina Patches for injection. The number to contact patient is [phone]

## 2020-01-05 NOTE — Telephone Encounter (Signed)
Valium requested. [place]. Scheduled for 9/13.

## 2020-01-06 ENCOUNTER — Other Ambulatory Visit: Payer: Self-pay | Admitting: Physical Medicine and Rehabilitation

## 2020-01-06 DIAGNOSIS — F411 Generalized anxiety disorder: Secondary | ICD-10-CM

## 2020-01-06 MED ORDER — DIAZEPAM 5 MG PO TABS: ORAL_TABLET | ORAL | 0 refills | Status: DC

## 2020-01-06 NOTE — Progress Notes (Signed)
Pre-procedure diazepam ordered for pre-operative anxiety.  

## 2020-01-06 NOTE — Telephone Encounter (Signed)
done

## 2020-01-07 ENCOUNTER — Telehealth: Payer: Self-pay | Admitting: Cardiology

## 2020-01-07 NOTE — Telephone Encounter (Signed)
Patient states she is scheduled to have an epidural injection on 09/13 but states that last week she started slurring her works and pronouncing words wrong. Yesterday she states she was taking a nap then woke up and her tongue was numb and it felt like she had just been to the dentist. She also says that she has been having some episodes of vertigo. Denies dizziness, headaches or syncope. She states she does feel really tired but no chest pain and says she always has SOB but her lungs are clear so no concern there. Please advise.

## 2020-01-08 ENCOUNTER — Encounter (HOSPITAL_BASED_OUTPATIENT_CLINIC_OR_DEPARTMENT_OTHER): Payer: Self-pay

## 2020-01-08 ENCOUNTER — Inpatient Hospital Stay (HOSPITAL_BASED_OUTPATIENT_CLINIC_OR_DEPARTMENT_OTHER)
Admission: EM | Admit: 2020-01-08 | Discharge: 2020-01-13 | DRG: 988 | Disposition: A | Discharge status: Home / Self Care | Payer: Medicare HMO | Attending: Internal Medicine | Admitting: Internal Medicine

## 2020-01-08 ENCOUNTER — Emergency Department (HOSPITAL_BASED_OUTPATIENT_CLINIC_OR_DEPARTMENT_OTHER): Payer: Medicare HMO

## 2020-01-08 ENCOUNTER — Other Ambulatory Visit: Payer: Self-pay

## 2020-01-08 DIAGNOSIS — C3490 Malignant neoplasm of unspecified part of unspecified bronchus or lung: Secondary | ICD-10-CM

## 2020-01-08 DIAGNOSIS — I5043 Acute on chronic combined systolic (congestive) and diastolic (congestive) heart failure: Secondary | ICD-10-CM

## 2020-01-08 DIAGNOSIS — Z90710 Acquired absence of both cervix and uterus: Secondary | ICD-10-CM

## 2020-01-08 DIAGNOSIS — R4781 Slurred speech: Secondary | ICD-10-CM | POA: Diagnosis not present

## 2020-01-08 DIAGNOSIS — I6622 Occlusion and stenosis of left posterior cerebral artery: Secondary | ICD-10-CM | POA: Diagnosis present

## 2020-01-08 DIAGNOSIS — Z823 Family history of stroke: Secondary | ICD-10-CM

## 2020-01-08 DIAGNOSIS — R911 Solitary pulmonary nodule: Secondary | ICD-10-CM

## 2020-01-08 DIAGNOSIS — R748 Abnormal levels of other serum enzymes: Secondary | ICD-10-CM | POA: Diagnosis present

## 2020-01-08 DIAGNOSIS — Z79891 Long term (current) use of opiate analgesic: Secondary | ICD-10-CM

## 2020-01-08 DIAGNOSIS — I6529 Occlusion and stenosis of unspecified carotid artery: Secondary | ICD-10-CM | POA: Diagnosis present

## 2020-01-08 DIAGNOSIS — E785 Hyperlipidemia, unspecified: Secondary | ICD-10-CM | POA: Diagnosis present

## 2020-01-08 DIAGNOSIS — R200 Anesthesia of skin: Secondary | ICD-10-CM | POA: Diagnosis not present

## 2020-01-08 DIAGNOSIS — I10 Essential (primary) hypertension: Secondary | ICD-10-CM | POA: Diagnosis present

## 2020-01-08 DIAGNOSIS — G459 Transient cerebral ischemic attack, unspecified: Secondary | ICD-10-CM

## 2020-01-08 DIAGNOSIS — I447 Left bundle-branch block, unspecified: Secondary | ICD-10-CM | POA: Diagnosis present

## 2020-01-08 DIAGNOSIS — Z79899 Other long term (current) drug therapy: Secondary | ICD-10-CM

## 2020-01-08 DIAGNOSIS — I639 Cerebral infarction, unspecified: Secondary | ICD-10-CM | POA: Diagnosis present

## 2020-01-08 DIAGNOSIS — Z7982 Long term (current) use of aspirin: Secondary | ICD-10-CM

## 2020-01-08 DIAGNOSIS — F419 Anxiety disorder, unspecified: Secondary | ICD-10-CM | POA: Diagnosis present

## 2020-01-08 DIAGNOSIS — I428 Other cardiomyopathies: Secondary | ICD-10-CM | POA: Diagnosis present

## 2020-01-08 DIAGNOSIS — C7931 Secondary malignant neoplasm of brain: Secondary | ICD-10-CM | POA: Diagnosis not present

## 2020-01-08 DIAGNOSIS — G4733 Obstructive sleep apnea (adult) (pediatric): Secondary | ICD-10-CM | POA: Diagnosis present

## 2020-01-08 DIAGNOSIS — G8929 Other chronic pain: Secondary | ICD-10-CM | POA: Diagnosis present

## 2020-01-08 DIAGNOSIS — D1803 Hemangioma of intra-abdominal structures: Secondary | ICD-10-CM | POA: Diagnosis present

## 2020-01-08 DIAGNOSIS — Z7952 Long term (current) use of systemic steroids: Secondary | ICD-10-CM

## 2020-01-08 DIAGNOSIS — C7802 Secondary malignant neoplasm of left lung: Secondary | ICD-10-CM | POA: Diagnosis present

## 2020-01-08 DIAGNOSIS — E039 Hypothyroidism, unspecified: Secondary | ICD-10-CM | POA: Diagnosis present

## 2020-01-08 DIAGNOSIS — C7951 Secondary malignant neoplasm of bone: Secondary | ICD-10-CM | POA: Diagnosis present

## 2020-01-08 DIAGNOSIS — Z9889 Other specified postprocedural states: Secondary | ICD-10-CM

## 2020-01-08 DIAGNOSIS — Z8673 Personal history of transient ischemic attack (TIA), and cerebral infarction without residual deficits: Secondary | ICD-10-CM

## 2020-01-08 DIAGNOSIS — M5430 Sciatica, unspecified side: Secondary | ICD-10-CM | POA: Diagnosis present

## 2020-01-08 DIAGNOSIS — Z8249 Family history of ischemic heart disease and other diseases of the circulatory system: Secondary | ICD-10-CM

## 2020-01-08 DIAGNOSIS — C7949 Secondary malignant neoplasm of other parts of nervous system: Secondary | ICD-10-CM | POA: Diagnosis present

## 2020-01-08 DIAGNOSIS — Z66 Do not resuscitate: Secondary | ICD-10-CM | POA: Diagnosis present

## 2020-01-08 DIAGNOSIS — Z79890 Hormone replacement therapy (postmenopausal): Secondary | ICD-10-CM

## 2020-01-08 DIAGNOSIS — Z20822 Contact with and (suspected) exposure to covid-19: Secondary | ICD-10-CM | POA: Diagnosis present

## 2020-01-08 DIAGNOSIS — I629 Nontraumatic intracranial hemorrhage, unspecified: Secondary | ICD-10-CM

## 2020-01-08 DIAGNOSIS — K219 Gastro-esophageal reflux disease without esophagitis: Secondary | ICD-10-CM | POA: Diagnosis present

## 2020-01-08 DIAGNOSIS — G44209 Tension-type headache, unspecified, not intractable: Secondary | ICD-10-CM | POA: Diagnosis present

## 2020-01-08 DIAGNOSIS — C7801 Secondary malignant neoplasm of right lung: Secondary | ICD-10-CM | POA: Diagnosis present

## 2020-01-08 HISTORY — DX: Transient cerebral ischemic attack, unspecified: G45.9

## 2020-01-08 LAB — CBC
HCT: 45.3 % (ref 36.0–46.0)
Hemoglobin: 14.6 g/dL (ref 12.0–15.0)
MCH: 28.3 pg (ref 26.0–34.0)
MCHC: 32.2 g/dL (ref 30.0–36.0)
MCV: 87.8 fL (ref 80.0–100.0)
Platelets: 262 10*3/uL (ref 150–400)
RBC: 5.16 MIL/uL — ABNORMAL HIGH (ref 3.87–5.11)
RDW: 13.1 % (ref 11.5–15.5)
WBC: 8.1 10*3/uL (ref 4.0–10.5)
nRBC: 0 % (ref 0.0–0.2)

## 2020-01-08 LAB — PROTIME-INR
INR: 1 (ref 0.8–1.2)
Prothrombin Time: 12.7 seconds (ref 11.4–15.2)

## 2020-01-08 LAB — COMPREHENSIVE METABOLIC PANEL
ALT: 18 U/L (ref 0–44)
AST: 15 U/L (ref 15–41)
Albumin: 3.9 g/dL (ref 3.5–5.0)
Alkaline Phosphatase: 243 U/L — ABNORMAL HIGH (ref 38–126)
Anion gap: 8 (ref 5–15)
BUN: 23 mg/dL (ref 8–23)
CO2: 26 mmol/L (ref 22–32)
Calcium: 10.2 mg/dL (ref 8.9–10.3)
Chloride: 101 mmol/L (ref 98–111)
Creatinine, Ser: 0.89 mg/dL (ref 0.44–1.00)
GFR calc Af Amer: >60 mL/min (ref >60)
GFR calc non Af Amer: >60 mL/min (ref >60)
Glucose, Bld: 103 mg/dL — ABNORMAL HIGH (ref 70–99)
Potassium: 4.7 mmol/L (ref 3.5–5.1)
Sodium: 135 mmol/L (ref 135–145)
Total Bilirubin: 0.7 mg/dL (ref 0.3–1.2)
Total Protein: 7.8 g/dL (ref 6.5–8.1)

## 2020-01-08 LAB — DIFFERENTIAL
Abs Immature Granulocytes: 0.02 10*3/uL (ref 0.00–0.07)
Basophils Absolute: 0 10*3/uL (ref 0.0–0.1)
Basophils Relative: 1 %
Eosinophils Absolute: 0.3 10*3/uL (ref 0.0–0.5)
Eosinophils Relative: 4 %
Immature Granulocytes: 0 %
Lymphocytes Relative: 29 %
Lymphs Abs: 2.3 10*3/uL (ref 0.7–4.0)
Monocytes Absolute: 0.7 10*3/uL (ref 0.1–1.0)
Monocytes Relative: 8 %
Neutro Abs: 4.8 10*3/uL (ref 1.7–7.7)
Neutrophils Relative %: 58 %

## 2020-01-08 LAB — CBG MONITORING, ED: Glucose-Capillary: 96 mg/dL (ref 70–99)

## 2020-01-08 LAB — SARS CORONAVIRUS 2 BY RT PCR (HOSPITAL ORDER, PERFORMED IN ~~LOC~~ HOSPITAL LAB): SARS Coronavirus 2: NEGATIVE

## 2020-01-08 LAB — APTT: aPTT: 29 seconds (ref 24–36)

## 2020-01-08 NOTE — Progress Notes (Signed)
74 yr old female with hx of NICM, HTN, HLD, hypothyroidism, presented from [facility], for possible TIA versus stroke rule out. Complained of intermittent slurred speech with some right-sided facial numbness that has been ongoing intermittently for a few days. VSS, labs fairly unremarkable. CT head negative, unable to MRI brain in [place] ER, neurology recommended transfer to [facility] for TIA/CVA work-up. Please call neurology once pt arrives.

## 2020-01-08 NOTE — ED Notes (Signed)
Upon doing [facility] scale patient was unable to pick her legs up more than an inch or so due to chronic low back pain.  Strength however strong and equal.

## 2020-01-08 NOTE — ED Triage Notes (Signed)
Pt states last week she had a 15 minute episode of slurring her words. Pt reports that on 9/1 pt woke up with numbness to her tongue and face which lasted less than a minute. Pt symptoms have been waxing and waning. Denies neurological symptoms at the present. BEFAST screening is negative in triage.

## 2020-01-08 NOTE — ED Notes (Signed)
Assisted to use bathroom.  Requested to stay in the wheelchair due to increased back pain while lying in bed.  Also requested to take her own tylenol with codeine that she takes at home.  Okay received from provider.  Encouraged to call for assistance as needed.

## 2020-01-08 NOTE — Telephone Encounter (Signed)
Called patient. Encouraged her to go be seen in the emergency department. She verbally understood. No further questions.

## 2020-01-08 NOTE — Telephone Encounter (Signed)
Looks like she may be having TIA, ideally she need to go to the emergency room be checked including CT of her head

## 2020-01-08 NOTE — ED Provider Notes (Signed)
[facility] EMERGENCY DEPARTMENT Provider Note   CSN: [id number] Arrival date & time: 01/08/20  1259     History No chief complaint on file.   Stefanie Camacho is a 74 y.o. female.  HPI   Patient is a 74 year old female with a history of nonischemic cardiomyopathy, GERD, hypertension, hypothyroidism, LBBB, HLD.  She presents today stating that about 1 week ago she had a 15-minute episode where she was conversing and slurring her words.  This completely resolved.  2 days ago she woke from a nap in a recliner and states that for about 15 seconds she experienced diffuse numbness across the right cheek, tongue and neck.  This also fully resolved.  She discussed with her PCP who recommended that she come to the emergency department for further evaluation.  She has no complaints at this time.  Pt ambulates with a walker at baseline. She denies any recent falls or a history of frequent falls. No fevers, chills, chest pain, shortness of breath, abdominal pain, nausea, vomiting, dizziness, syncope.      Past Medical History:  Diagnosis Date  . Anxiety   . Dyslipidemia   . Essential hypertension   . GERD (gastroesophageal reflux disease)   . Hypothyroidism   . Left bundle branch block 06/04/2018  . NICM (nonischemic cardiomyopathy) ([place])   . Nonischemic cardiomyopathy ([place]) 06/04/2018   Ejection fraction 45% in summer 2019  . Sleep apnea     Patient Active Problem List   Diagnosis Date Noted  . Lumbar foraminal stenosis 12/30/2019  . Vertigo 09/25/2019  . Sleep apnea   . GERD (gastroesophageal reflux disease)   . Anxiety   . NICM (nonischemic cardiomyopathy) ([place])   . Hypothyroidism   . Nonischemic cardiomyopathy ([place]) 06/04/2018  . Essential hypertension 06/04/2018  . Left bundle branch block 06/04/2018  . Dyslipidemia 06/04/2018    Past Surgical History:  Procedure Laterality Date  . ABDOMINAL HYSTERECTOMY    . BLADDER NECK RECONSTRUCTION    . SHOULDER SURGERY    .  TONSILLECTOMY       OB History   No obstetric history on file.     Family History  Problem Relation Age of Onset  . Heart disease Mother   . Stroke Mother   . Heart disease Father   . Stroke Father   . Heart disease Maternal Grandfather   . Heart disease Paternal Grandfather     Social History   Tobacco Use  . Smoking status: Never Smoker  . Smokeless tobacco: Never Used  Vaping Use  . Vaping Use: Never used  Substance Use Topics  . Alcohol use: Not Currently  . Drug use: Never    Home Medications Prior to Admission medications   Medication Sig Start Date End Date Taking? Authorizing Provider  acetaminophen-codeine (TYLENOL #3) 300-30 MG tablet Take 1-2 tablets by mouth every 8 (eight) hours as needed for moderate pain. 12/31/19   Marybelle Killings, MD  aspirin EC 81 MG tablet Take 81 mg by mouth daily.    [provider]  atorvastatin (LIPITOR) 10 MG tablet Take 1 tablet (10 mg total) by mouth daily at 6 PM. 10/13/19 01/11/20  Park Liter, MD  diazepam (VALIUM) 5 MG tablet Take 1 by mouth 1 hour  pre-procedure with very light food. May bring 2nd tablet to appointment. 01/06/20   Magnus Sinning, MD  FLUoxetine (PROZAC) 10 MG tablet  09/04/19   [provider]  gabapentin (NEURONTIN) 300 MG capsule Take 1  capsule (300 mg total) by mouth at bedtime. 12/16/19   Mcarthur Rossetti, MD  HYDROcodone-acetaminophen (NORCO/VICODIN) 5-325 MG tablet Take 1 tablet by mouth every 8 (eight) hours as needed. 11/13/19   Couture, Cortni S, PA-C  levothyroxine (SYNTHROID, LEVOTHROID) 25 MCG tablet Take 1 tablet by mouth daily. Pt takes 1 and 1/2 daily 05/14/18   [provider]  losartan (COZAAR) 50 MG tablet Take 1 tablet (50 mg total) by mouth daily. 10/13/19   Park Liter, MD  methylPREDNISolone (MEDROL) 4 MG tablet Take as directed 11/16/19   Pete Pelt, PA-C  metoprolol succinate (TOPROL-XL) 50 MG 24 hr tablet Take 1 tablet (50 mg total) by mouth  daily. Take with or immediately following a meal. 10/13/19   Park Liter, MD  omeprazole (PRILOSEC) 20 MG capsule Take 20 mg by mouth daily.    [provider]  spironolactone (ALDACTONE) 25 MG tablet Take 0.5 tablets (12.5 mg total) by mouth daily. 11/17/19   Park Liter, MD    Allergies    Patient has no known allergies.  Review of Systems   Review of Systems  All other systems reviewed and are negative. Ten systems reviewed and are negative for acute change, except as noted in the HPI.   Physical Exam Updated Vital Signs BP 134/78 (BP Location: Right Arm)   Pulse 69   Temp 97.6 F (36.4 C) (Oral)   Resp 18   Ht 5\' 6"  (1.676 m)   Wt 89.8 kg   SpO2 96%   BMI 31.96 kg/m   Physical Exam Vitals and nursing note reviewed.  Constitutional:      General: She is not in acute distress.    Appearance: Normal appearance. She is not ill-appearing, toxic-appearing or diaphoretic.  HENT:     Head: Normocephalic and atraumatic.     Right Ear: External ear normal.     Left Ear: External ear normal.     Nose: Nose normal.     Mouth/Throat:     Mouth: Mucous membranes are moist.     Pharynx: Oropharynx is clear. No oropharyngeal exudate or posterior oropharyngeal erythema.  Eyes:     General: No scleral icterus.       Right eye: No discharge.        Left eye: No discharge.     Extraocular Movements: Extraocular movements intact.     Conjunctiva/sclera: Conjunctivae normal.     Pupils: Pupils are equal, round, and reactive to light.     Comments: EOMI. PERRL.   Cardiovascular:     Rate and Rhythm: Normal rate and regular rhythm.     Pulses: Normal pulses.     Heart sounds: Normal heart sounds. No murmur heard.  No friction rub. No gallop.   Pulmonary:     Effort: Pulmonary effort is normal. No respiratory distress.     Breath sounds: Normal breath sounds. No stridor. No wheezing, rhonchi or rales.  Abdominal:     General: Abdomen is flat.     Palpations:  Abdomen is soft.     Tenderness: There is no abdominal tenderness.  Musculoskeletal:        General: Normal range of motion.     Cervical back: Normal range of motion and neck supple. No tenderness.  Skin:    General: Skin is warm and dry.  Neurological:     General: No focal deficit present.     Mental Status: She is alert and oriented to person,  place, and time.     Comments: Patient is oriented to person, place, and time. Patient phonates in clear, complete, and coherent sentences. Negative arm drift. Finger to nose intact bilaterally with no visible signs of dysmetria. Strength is 5/5 in all four extremities. Distal sensation intact in all four extremities.   Psychiatric:        Mood and Affect: Mood normal.        Behavior: Behavior normal.     ED Results / Procedures / Treatments   Labs (all labs ordered are listed, but only abnormal results are displayed) Labs Reviewed  CBC - Abnormal; Notable for the following components:      Result Value   RBC 5.16 (*)    All other components within normal limits  COMPREHENSIVE METABOLIC PANEL - Abnormal; Notable for the following components:   Glucose, Bld 103 (*)    Alkaline Phosphatase 243 (*)    All other components within normal limits  SARS CORONAVIRUS 2 BY RT PCR (HOSPITAL ORDER, [facility])  PROTIME-INR  APTT  DIFFERENTIAL  CBG MONITORING, ED    EKG EKG Interpretation  Date/Time:  Friday January 08 2020 13:14:11 EDT Ventricular Rate:  69 PR Interval:  164 QRS Duration: 100 QT Interval:  402 QTC Calculation: 430 R Axis:   71 Text Interpretation: Normal sinus rhythm Anterior infarct , age undetermined Abnormal ECG No old tracing to compare Confirmed by Calvert Cantor [phone]) on 01/08/2020 1:38:15 PM   Radiology CT HEAD WO CONTRAST  Result Date: 01/08/2020 CLINICAL DATA:  TIA.  Right facial numbness 2 days ago. EXAM: CT HEAD WITHOUT CONTRAST TECHNIQUE: Contiguous axial images were obtained  from the base of the skull through the vertex without intravenous contrast. COMPARISON:  None. FINDINGS: Brain: No evidence of acute infarction, hemorrhage, hydrocephalus, extra-axial collection or mass lesion/mass effect. Vascular: Negative for hyperdense vessel Skull: Negative Sinuses/Orbits: Paranasal sinuses clear. Bilateral cataract extraction. No orbital lesion. Other: None IMPRESSION: Negative CT head Electronically Signed   By: Franchot Gallo M.D.   On: 01/08/2020 13:50    Procedures Procedures (including critical care time)  Medications Ordered in ED Medications - No data to display  ED Course  I have reviewed the triage vital signs and the nursing notes.  Pertinent labs & imaging results that were available during my care of the patient were reviewed by me and considered in my medical decision making (see chart for details).  Clinical Course as of Jan 08 1627  Fri Jan 08, 2020  1626 Patient discussed with the neurology team.  They feel that because the patient's symptoms can all be localized to the left MCA, that we should get a TIA work-up.  MRI and MRA of the brain ordered.  Patient discussed with the hospitalist team who will accept her for admission and further work-up.   [LJ]    Clinical Course User Index [LJ] Rayna Sexton, PA-C   MDM Rules/Calculators/A&P                          Pt is a 74 y.o. female that presents with a history, physical exam, and ED Clinical Course as noted above.   Pt presents due to an episode of slurred speech as well as facial numbness. No current sx at this time. Physical exam reassuring and neuro exam benign.  Discussed with attending physician who recommended I speak with neurology. Discussed with neurology who recommended pt be admitted  and worked up for TIA. I've ordered MRI/MRA of the brain and discussed with hospitalist team for admission. COVID-19 test has been ordered.   Note: Portions of this report may have been transcribed using  voice recognition software. Every effort was made to ensure accuracy; however, inadvertent computerized transcription errors may be present.   Final Clinical Impression(s) / ED Diagnoses Final diagnoses:  TIA (transient ischemic attack)    Rx / DC Orders ED Discharge Orders    None       Rayna Sexton, PA-C 01/08/20 [place], [place], DO 01/08/20 1841

## 2020-01-09 ENCOUNTER — Encounter (HOSPITAL_COMMUNITY): Payer: Self-pay | Admitting: Internal Medicine

## 2020-01-09 ENCOUNTER — Inpatient Hospital Stay (HOSPITAL_COMMUNITY): Payer: Medicare HMO

## 2020-01-09 DIAGNOSIS — K219 Gastro-esophageal reflux disease without esophagitis: Secondary | ICD-10-CM | POA: Diagnosis not present

## 2020-01-09 DIAGNOSIS — Z79899 Other long term (current) drug therapy: Secondary | ICD-10-CM | POA: Diagnosis not present

## 2020-01-09 DIAGNOSIS — Z90710 Acquired absence of both cervix and uterus: Secondary | ICD-10-CM | POA: Diagnosis not present

## 2020-01-09 DIAGNOSIS — I6622 Occlusion and stenosis of left posterior cerebral artery: Secondary | ICD-10-CM | POA: Diagnosis present

## 2020-01-09 DIAGNOSIS — R918 Other nonspecific abnormal finding of lung field: Secondary | ICD-10-CM | POA: Diagnosis not present

## 2020-01-09 DIAGNOSIS — R4781 Slurred speech: Secondary | ICD-10-CM | POA: Diagnosis present

## 2020-01-09 DIAGNOSIS — Z79891 Long term (current) use of opiate analgesic: Secondary | ICD-10-CM | POA: Diagnosis not present

## 2020-01-09 DIAGNOSIS — I639 Cerebral infarction, unspecified: Secondary | ICD-10-CM

## 2020-01-09 DIAGNOSIS — R847 Abnormal histological findings in specimens from respiratory organs and thorax: Secondary | ICD-10-CM | POA: Diagnosis not present

## 2020-01-09 DIAGNOSIS — Z79890 Hormone replacement therapy (postmenopausal): Secondary | ICD-10-CM | POA: Diagnosis not present

## 2020-01-09 DIAGNOSIS — I447 Left bundle-branch block, unspecified: Secondary | ICD-10-CM | POA: Diagnosis present

## 2020-01-09 DIAGNOSIS — C3491 Malignant neoplasm of unspecified part of right bronchus or lung: Secondary | ICD-10-CM | POA: Diagnosis not present

## 2020-01-09 DIAGNOSIS — C7951 Secondary malignant neoplasm of bone: Secondary | ICD-10-CM | POA: Diagnosis not present

## 2020-01-09 DIAGNOSIS — I6389 Other cerebral infarction: Secondary | ICD-10-CM

## 2020-01-09 DIAGNOSIS — C3432 Malignant neoplasm of lower lobe, left bronchus or lung: Secondary | ICD-10-CM | POA: Diagnosis not present

## 2020-01-09 DIAGNOSIS — C7802 Secondary malignant neoplasm of left lung: Secondary | ICD-10-CM | POA: Diagnosis not present

## 2020-01-09 DIAGNOSIS — R911 Solitary pulmonary nodule: Secondary | ICD-10-CM | POA: Diagnosis not present

## 2020-01-09 DIAGNOSIS — Z515 Encounter for palliative care: Secondary | ICD-10-CM | POA: Diagnosis not present

## 2020-01-09 DIAGNOSIS — E785 Hyperlipidemia, unspecified: Secondary | ICD-10-CM | POA: Diagnosis not present

## 2020-01-09 DIAGNOSIS — Z8249 Family history of ischemic heart disease and other diseases of the circulatory system: Secondary | ICD-10-CM | POA: Diagnosis not present

## 2020-01-09 DIAGNOSIS — C7801 Secondary malignant neoplasm of right lung: Secondary | ICD-10-CM | POA: Diagnosis not present

## 2020-01-09 DIAGNOSIS — C3492 Malignant neoplasm of unspecified part of left bronchus or lung: Secondary | ICD-10-CM | POA: Diagnosis not present

## 2020-01-09 DIAGNOSIS — C801 Malignant (primary) neoplasm, unspecified: Secondary | ICD-10-CM | POA: Diagnosis not present

## 2020-01-09 DIAGNOSIS — I10 Essential (primary) hypertension: Secondary | ICD-10-CM | POA: Diagnosis not present

## 2020-01-09 DIAGNOSIS — C7931 Secondary malignant neoplasm of brain: Secondary | ICD-10-CM | POA: Diagnosis not present

## 2020-01-09 DIAGNOSIS — F419 Anxiety disorder, unspecified: Secondary | ICD-10-CM | POA: Diagnosis present

## 2020-01-09 DIAGNOSIS — I629 Nontraumatic intracranial hemorrhage, unspecified: Secondary | ICD-10-CM

## 2020-01-09 DIAGNOSIS — C7800 Secondary malignant neoplasm of unspecified lung: Secondary | ICD-10-CM | POA: Diagnosis not present

## 2020-01-09 DIAGNOSIS — R569 Unspecified convulsions: Secondary | ICD-10-CM | POA: Diagnosis not present

## 2020-01-09 DIAGNOSIS — C7949 Secondary malignant neoplasm of other parts of nervous system: Secondary | ICD-10-CM | POA: Diagnosis not present

## 2020-01-09 DIAGNOSIS — I619 Nontraumatic intracerebral hemorrhage, unspecified: Secondary | ICD-10-CM | POA: Diagnosis not present

## 2020-01-09 DIAGNOSIS — C3490 Malignant neoplasm of unspecified part of unspecified bronchus or lung: Secondary | ICD-10-CM | POA: Diagnosis not present

## 2020-01-09 DIAGNOSIS — R748 Abnormal levels of other serum enzymes: Secondary | ICD-10-CM | POA: Diagnosis present

## 2020-01-09 DIAGNOSIS — Z7982 Long term (current) use of aspirin: Secondary | ICD-10-CM | POA: Diagnosis not present

## 2020-01-09 DIAGNOSIS — Z7189 Other specified counseling: Secondary | ICD-10-CM | POA: Diagnosis not present

## 2020-01-09 DIAGNOSIS — Z66 Do not resuscitate: Secondary | ICD-10-CM | POA: Diagnosis not present

## 2020-01-09 DIAGNOSIS — E039 Hypothyroidism, unspecified: Secondary | ICD-10-CM | POA: Diagnosis not present

## 2020-01-09 DIAGNOSIS — Z823 Family history of stroke: Secondary | ICD-10-CM | POA: Diagnosis not present

## 2020-01-09 DIAGNOSIS — Z20822 Contact with and (suspected) exposure to covid-19: Secondary | ICD-10-CM | POA: Diagnosis not present

## 2020-01-09 DIAGNOSIS — I428 Other cardiomyopathies: Secondary | ICD-10-CM | POA: Diagnosis not present

## 2020-01-09 HISTORY — DX: Nontraumatic intracranial hemorrhage, unspecified: I62.9

## 2020-01-09 HISTORY — DX: Cerebral infarction, unspecified: I63.9

## 2020-01-09 LAB — ECHOCARDIOGRAM COMPLETE
AR max vel: 1.98 cm2
AV Area VTI: 2.1 cm2
AV Area mean vel: 2.04 cm2
AV Mean grad: 3 mmHg
AV Peak grad: 6.3 mmHg
Ao pk vel: 1.25 m/s
Area-P 1/2: 2.45 cm2
Height: 66 in
S' Lateral: 3 cm
Weight: 3097.02 oz

## 2020-01-09 LAB — LIPID PANEL
Cholesterol: 127 mg/dL (ref 0–200)
HDL: 38 mg/dL — ABNORMAL LOW (ref >40)
LDL Cholesterol: 67 mg/dL (ref 0–99)
Total CHOL/HDL Ratio: 3.3 RATIO
Triglycerides: 112 mg/dL (ref <150)
VLDL: 22 mg/dL (ref 0–40)

## 2020-01-09 LAB — HEMOGLOBIN A1C
Hgb A1c MFr Bld: 6 % — ABNORMAL HIGH (ref 4.8–5.6)
Mean Plasma Glucose: 125.5 mg/dL

## 2020-01-09 MED ORDER — ASPIRIN EC 81 MG PO TBEC
81.0000 mg | DELAYED_RELEASE_TABLET | Freq: Every day | ORAL | Status: DC
  Administered 2020-01-09 – 2020-01-13 (×5): 81 mg via ORAL
  Filled 2020-01-09 (×5): qty 1

## 2020-01-09 MED ORDER — STROKE: EARLY STAGES OF RECOVERY BOOK
Freq: Once | Status: AC
  Filled 2020-01-09: qty 1

## 2020-01-09 MED ORDER — ACETAMINOPHEN 325 MG PO TABS: 650.0000 mg | ORAL_TABLET | ORAL | Status: DC | PRN

## 2020-01-09 MED ORDER — PANTOPRAZOLE SODIUM 40 MG PO TBEC
40.0000 mg | DELAYED_RELEASE_TABLET | Freq: Every day | ORAL | Status: DC
  Administered 2020-01-10 – 2020-01-13 (×4): 40 mg via ORAL
  Filled 2020-01-09 (×5): qty 1

## 2020-01-09 MED ORDER — IOHEXOL 350 MG/ML SOLN
75.0000 mL | Freq: Once | INTRAVENOUS | Status: AC | PRN
  Administered 2020-01-09: 75 mL via INTRAVENOUS

## 2020-01-09 MED ORDER — ATORVASTATIN CALCIUM 10 MG PO TABS
10.0000 mg | ORAL_TABLET | Freq: Every day | ORAL | Status: DC
  Administered 2020-01-09 – 2020-01-12 (×4): 10 mg via ORAL
  Filled 2020-01-09 (×4): qty 1

## 2020-01-09 MED ORDER — METOPROLOL SUCCINATE ER 50 MG PO TB24
50.0000 mg | ORAL_TABLET | Freq: Every day | ORAL | Status: DC
  Administered 2020-01-09 – 2020-01-13 (×5): 50 mg via ORAL
  Filled 2020-01-09 (×5): qty 1

## 2020-01-09 MED ORDER — LOSARTAN POTASSIUM 50 MG PO TABS
50.0000 mg | ORAL_TABLET | Freq: Every day | ORAL | Status: DC
  Administered 2020-01-09 – 2020-01-13 (×5): 50 mg via ORAL
  Filled 2020-01-09 (×5): qty 1

## 2020-01-09 MED ORDER — LEVOTHYROXINE SODIUM 75 MCG PO TABS
75.0000 ug | ORAL_TABLET | Freq: Every day | ORAL | Status: DC
  Administered 2020-01-09 – 2020-01-13 (×5): 75 ug via ORAL
  Filled 2020-01-09 (×5): qty 1

## 2020-01-09 MED ORDER — SPIRONOLACTONE 12.5 MG HALF TABLET
12.5000 mg | ORAL_TABLET | Freq: Every day | ORAL | Status: DC
  Administered 2020-01-09 – 2020-01-13 (×5): 12.5 mg via ORAL
  Filled 2020-01-09 (×5): qty 1

## 2020-01-09 MED ORDER — ACETAMINOPHEN 160 MG/5ML PO SOLN: 650.0000 mg | ORAL | Status: DC | PRN

## 2020-01-09 MED ORDER — ACETAMINOPHEN-CODEINE #3 300-30 MG PO TABS
1.0000 | ORAL_TABLET | Freq: Three times a day (TID) | ORAL | Status: DC | PRN
  Administered 2020-01-10 – 2020-01-11 (×4): 2 via ORAL
  Filled 2020-01-09: qty 1
  Filled 2020-01-09 (×2): qty 2
  Filled 2020-01-09: qty 1
  Filled 2020-01-09: qty 2

## 2020-01-09 MED ORDER — FLUOXETINE HCL 10 MG PO CAPS
10.0000 mg | ORAL_CAPSULE | Freq: Every day | ORAL | Status: DC
  Administered 2020-01-09 – 2020-01-13 (×5): 10 mg via ORAL
  Filled 2020-01-09 (×5): qty 1

## 2020-01-09 MED ORDER — ACETAMINOPHEN 650 MG RE SUPP: 650.0000 mg | RECTAL | Status: DC | PRN

## 2020-01-09 NOTE — Evaluation (Signed)
Speech Language Pathology Evaluation Patient Details Name: Dayton Sherr MRN: [id number] DOB: Mar 17, 1946 Today's Date: 01/09/2020 Time: 0902-0920 SLP Time Calculation (min) (ACUTE ONLY): 18 min  Problem List:  Patient Active Problem List   Diagnosis Date Noted  . Acute CVA (cerebrovascular accident) ([place]) 01/09/2020  . Intracranial bleeding ([place]) 01/09/2020  . TIA (transient ischemic attack) 01/08/2020  . Lumbar foraminal stenosis 12/30/2019  . Vertigo 09/25/2019  . Sleep apnea   . GERD (gastroesophageal reflux disease)   . Anxiety   . NICM (nonischemic cardiomyopathy) ([place])   . Hypothyroidism   . Nonischemic cardiomyopathy ([place]) 06/04/2018  . Essential hypertension 06/04/2018  . Left bundle branch block 06/04/2018  . Dyslipidemia 06/04/2018   Past Medical History:  Past Medical History:  Diagnosis Date  . Anxiety   . Dyslipidemia   . Essential hypertension   . GERD (gastroesophageal reflux disease)   . Hypothyroidism   . Left bundle branch block 06/04/2018  . NICM (nonischemic cardiomyopathy) ([place])   . Nonischemic cardiomyopathy ([place]) 06/04/2018   Ejection fraction 45% in summer 2019  . Sleep apnea    Past Surgical History:  Past Surgical History:  Procedure Laterality Date  . ABDOMINAL HYSTERECTOMY    . BLADDER NECK RECONSTRUCTION    . SHOULDER SURGERY    . TONSILLECTOMY     HPI:  74 year old female with a small hemorrhagic lesion in the left occipital lobe. Presented with transient episode of numbness that happened earlier this week.  She states that in was in the right side of her face.  It only lasted for a few minutes then resolved.    Assessment / Plan / Recommendation Clinical Impression  Pt demosntrates Mild impairment with problem solving and error recognition with complex visuospatial tasks. She has no difficulty with verbal only tasks, but when presented with tasks involving visual interpretation pt had several errors she could not self correct. She also  reports she typically likes to do printed puzzles and confirms she has had difficulty with these recently. Encouraged pt to continue efforts with puzzles at home to challenge her area of deficits. Also recommend f/u with OP SLP to target this impairment as needed after d/c. Will sign off.     SLP Assessment  SLP Recommendation/Assessment: All further Speech Lanaguage Pathology  needs can be addressed in the next venue of care SLP Visit Diagnosis: Cognitive communication deficit (R41.841)    Follow Up Recommendations  Outpatient SLP    Frequency and Duration           SLP Evaluation Cognition  Overall Cognitive Status: Within Functional Limits for tasks assessed Arousal/Alertness: Awake/alert Orientation Level: Oriented X4 Attention: Alternating Alternating Attention: Appears intact Memory: Appears intact Awareness: Appears intact Problem Solving: Appears intact Executive Function: Self Monitoring Self Monitoring: Impaired Self Monitoring Impairment: Functional complex Safety/Judgment: Appears intact       Comprehension  Auditory Comprehension Overall Auditory Comprehension: Appears within functional limits for tasks assessed Visual Recognition/Discrimination Discrimination: Within Function Limits Reading Comprehension Reading Status: Within funtional limits    Expression Expression Primary Mode of Expression: Verbal Verbal Expression Overall Verbal Expression: Appears within functional limits for tasks assessed Written Expression Dominant Hand: Right Written Expression: Within Functional Limits   Oral / Motor  Oral Motor/Sensory Function Overall Oral Motor/Sensory Function: Within functional limits Motor Speech Overall Motor Speech: Appears within functional limits for tasks assessed   GO                   [employer],  MA CCC-SLP  Acute Rehabilitation Services Pager [phone] Office [phone]  Lynann Beaver 01/09/2020, 10:04 AM

## 2020-01-09 NOTE — Progress Notes (Signed)
Subjective: Patient admitted this morning, see detailed H&P by Dr. Hal Hope 74 year old female with a history of nonischemic cardiomyopathy, hypothyroidism, hypertension who experienced slurred speech about a week ago when she was talking to her friend on phone.  This lasted for about 15 minutes and resolved.  About 4 days ago patient had right facial numbness which lasted for few seconds and resolved.  She was advised to come to the ED.  She did not have weakness of upper or lower extremities difficulty swallowing or any visual symptoms.  In the ED CT head was unremarkable MRI of the brain and MRI of the brain showed hemorrhagic conversion of stroke.  Neurology was consulted.  Vitals:   01/09/20 0815 01/09/20 1617  BP: 137/66 123/62  Pulse: (!) 59 65  Resp: 17 17  Temp: 97.8 F (36.6 C) 97.9 F (36.6 C)  SpO2: 95% 96%      A/P Acute CVA with hemorrhagic conversion-neurology consulted.  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  Will order CT chest/abdomen/pelvis with contrast in a.m.    [place] Hospitalist [phone]

## 2020-01-09 NOTE — Progress Notes (Signed)
Lower extremity venous bilateral study completed.   Please see CV Proc for preliminary results.   Stefanie Camacho

## 2020-01-09 NOTE — H&P (Signed)
History and Physical    Koral Thaden NTZ:[id number] DOB: July 22, 1945 DOA: 01/08/2020  PCP: [name], MD  Patient coming from: Home.  Chief Complaint: Slurred speech and right facial numbness.  HPI: Stefanie Camacho is a 74 y.o. female with history of nonischemic cardiomyopathy, hypothyroidism, hypertension experienced slurred speech about a week ago when she was talking on the phone with her friend.  This lasted for around 15 minutes and resolved.  About 4 days ago patient had right facial numbness which lasted for few seconds and resolved.  Yesterday when patient was talking to a friend advised her to come to the ER and patient contacted her primary care physician was advised.  Patient did not have weakness of the upper or lower extremities or any difficulty swallowing or any visual symptoms.  Has been having some dull frontal headache for last few weeks.   ED Course: In the ER patient appeared nonfocal CT head was unremarkable patient admitted for further work-up of TIA versus stroke.  EKG shows normal sinus rhythm Covid test was negative and labs were unremarkable except for mildly elevated alkaline phosphatase.  After reaching the hospital patient had MRI of the brain and MRA of the brain which shows possible hemorrhagic conversion of her stroke for which I discussed with on-call neurologist Dr. Leonel Ramsay.  There also was left PCA stenosis.  Review of Systems: As per HPI, rest all negative.   Past Medical History:  Diagnosis Date  . Anxiety   . Dyslipidemia   . Essential hypertension   . GERD (gastroesophageal reflux disease)   . Hypothyroidism   . Left bundle branch block 06/04/2018  . NICM (nonischemic cardiomyopathy) ([place])   . Nonischemic cardiomyopathy ([place]) 06/04/2018   Ejection fraction 45% in summer 2019  . Sleep apnea     Past Surgical History:  Procedure Laterality Date  . ABDOMINAL HYSTERECTOMY    . BLADDER NECK RECONSTRUCTION    . SHOULDER SURGERY    .  TONSILLECTOMY       reports that she has never smoked. She has never used smokeless tobacco. She reports previous alcohol use. She reports that she does not use drugs.  No Known Allergies  Family History  Problem Relation Age of Onset  . Heart disease Mother   . Stroke Mother   . Heart disease Father   . Stroke Father   . Heart disease Maternal Grandfather   . Heart disease Paternal Grandfather     Prior to Admission medications   Medication Sig Start Date End Date Taking? Authorizing Provider  acetaminophen-codeine (TYLENOL #3) 300-30 MG tablet Take 1-2 tablets by mouth every 8 (eight) hours as needed for moderate pain. 12/31/19   Marybelle Killings, MD  aspirin EC 81 MG tablet Take 81 mg by mouth daily.    [provider]  atorvastatin (LIPITOR) 10 MG tablet Take 1 tablet (10 mg total) by mouth daily at 6 PM. 10/13/19 01/11/20  Park Liter, MD  diazepam (VALIUM) 5 MG tablet Take 1 by mouth 1 hour  pre-procedure with very light food. May bring 2nd tablet to appointment. 01/06/20   Magnus Sinning, MD  FLUoxetine (PROZAC) 10 MG tablet  09/04/19   [provider]  gabapentin (NEURONTIN) 300 MG capsule Take 1 capsule (300 mg total) by mouth at bedtime. 12/16/19   Mcarthur Rossetti, MD  HYDROcodone-acetaminophen (NORCO/VICODIN) 5-325 MG tablet Take 1 tablet by mouth every 8 (eight) hours as needed. 11/13/19   Couture, Cortni S, PA-C  levothyroxine (SYNTHROID, LEVOTHROID) 25 MCG tablet Take 1 tablet by mouth daily. Pt takes 1 and 1/2 daily 05/14/18   [provider]  losartan (COZAAR) 50 MG tablet Take 1 tablet (50 mg total) by mouth daily. 10/13/19   Park Liter, MD  methylPREDNISolone (MEDROL) 4 MG tablet Take as directed 11/16/19   Pete Pelt, PA-C  metoprolol succinate (TOPROL-XL) 50 MG 24 hr tablet Take 1 tablet (50 mg total) by mouth daily. Take with or immediately following a meal. 10/13/19   Park Liter, MD  omeprazole (PRILOSEC) 20 MG  capsule Take 20 mg by mouth daily.    [provider]  spironolactone (ALDACTONE) 25 MG tablet Take 0.5 tablets (12.5 mg total) by mouth daily. 11/17/19   Park Liter, MD    Physical Exam: Constitutional: Moderately built and nourished. Vitals:   01/08/20 1846 01/08/20 2100 01/08/20 2300 01/09/20 0041  BP: 117/68 124/60 118/68 (!) 162/61  Pulse: (!) 59 63 (!) 56 (!) 55  Resp: 16 20  14   Temp:    97.9 F (36.6 C)  TempSrc:    Oral  SpO2: 96% 93% 93% 97%  Weight:    87.8 kg  Height:    5\' 6"  (1.676 m)   Eyes: Anicteric no pallor. ENMT: No discharge from the ears eyes nose or mouth. Neck: No mass felt.  No neck rigidity. Respiratory: No rhonchi or crepitations. Cardiovascular: S1-S2 heard. Abdomen: Soft nontender bowel sounds present. Musculoskeletal: No edema. Skin: No rash. Neurologic: Alert awake oriented to time place and person.  Moves all extremities 5 x 5.  No facial asymmetry tongue is midline. Psychiatric: Appears normal.  Normal affect.   Labs on Admission: I have personally reviewed following labs and imaging studies  CBC: Recent Labs  Lab 01/08/20 1316  WBC 8.1  NEUTROABS 4.8  HGB 14.6  HCT 45.3  MCV 87.8  PLT 419   Basic Metabolic Panel: Recent Labs  Lab 01/08/20 1316  NA 135  K 4.7  CL 101  CO2 26  GLUCOSE 103*  BUN 23  CREATININE 0.89  CALCIUM 10.2   GFR: Estimated Creatinine Clearance: 63.8 mL/min (by C-G formula based on SCr of 0.89 mg/dL). Liver Function Tests: Recent Labs  Lab 01/08/20 1316  AST 15  ALT 18  ALKPHOS 243*  BILITOT 0.7  PROT 7.8  ALBUMIN 3.9   No results for input(s): LIPASE, AMYLASE in the last 168 hours. No results for input(s): AMMONIA in the last 168 hours. Coagulation Profile: Recent Labs  Lab 01/08/20 1316  INR 1.0   Cardiac Enzymes: No results for input(s): CKTOTAL, CKMB, CKMBINDEX, TROPONINI in the last 168 hours. BNP (last 3 results) No results for input(s): PROBNP in the last 8760  hours. HbA1C: No results for input(s): HGBA1C in the last 72 hours. CBG: Recent Labs  Lab 01/08/20 1347  GLUCAP 96   Lipid Profile: No results for input(s): CHOL, HDL, LDLCALC, TRIG, CHOLHDL, LDLDIRECT in the last 72 hours. Thyroid Function Tests: No results for input(s): TSH, T4TOTAL, FREET4, T3FREE, THYROIDAB in the last 72 hours. Anemia Panel: No results for input(s): VITAMINB12, FOLATE, FERRITIN, TIBC, IRON, RETICCTPCT in the last 72 hours. Urine analysis:    Component Value Date/Time   COLORURINE YELLOW 11/13/2019 [place] 11/13/2019 0540   LABSPEC 1.015 11/13/2019 0540   PHURINE 5.0 11/13/2019 [place] 11/13/2019 0540   HGBUR NEGATIVE 11/13/2019 [place] NEGATIVE 11/13/2019 0540   Benjamin Stain  NEGATIVE 11/13/2019 0540   PROTEINUR NEGATIVE 11/13/2019 0540   NITRITE NEGATIVE 11/13/2019 0540   LEUKOCYTESUR SMALL (A) 11/13/2019 0540   Sepsis Labs: @LABRCNTIP (procalcitonin:4,lacticidven:4) ) Recent Results (from the past 240 hour(s))  SARS Coronavirus 2 by RT PCR (hospital order, performed in [facility] hospital lab) Nasopharyngeal Nasopharyngeal Swab     Status: None   Collection Time: 01/08/20  4:28 PM   Specimen: Nasopharyngeal Swab  Result Value Ref Range Status   SARS Coronavirus 2 NEGATIVE NEGATIVE Final    Comment: (NOTE) SARS-CoV-2 target nucleic acids are NOT DETECTED.  The SARS-CoV-2 RNA is generally detectable in upper and lower respiratory specimens during the acute phase of infection. The lowest concentration of SARS-CoV-2 viral copies this assay can detect is 250 copies / mL. A negative result does not preclude SARS-CoV-2 infection and should not be used as the sole basis for treatment or other patient management decisions.  A negative result may occur with improper specimen collection / handling, submission of specimen other than nasopharyngeal swab, presence of viral mutation(s) within the areas targeted by  this assay, and inadequate number of viral copies (<250 copies / mL). A negative result must be combined with clinical observations, patient history, and epidemiological information.  Fact Sheet for Patients:   StrictlyIdeas.no  Fact Sheet for Healthcare Providers: BankingDealers.co.za  This test is not yet approved or  cleared by the [place] FDA and has been authorized for detection and/or diagnosis of SARS-CoV-2 by FDA under an Emergency Use Authorization (EUA).  This EUA will remain in effect (meaning this test can be used) for the duration of the COVID-19 declaration under Section 564(b)(1) of the Act, 21 U.S.C. section 360bbb-3(b)(1), unless the authorization is terminated or revoked sooner.  Performed at [facility], [address] [place], [place] [address]      Radiological Exams on Admission: CT HEAD WO CONTRAST  Result Date: 01/08/2020 CLINICAL DATA:  TIA.  Right facial numbness 2 days ago. EXAM: CT HEAD WITHOUT CONTRAST TECHNIQUE: Contiguous axial images were obtained from the base of the skull through the vertex without intravenous contrast. COMPARISON:  None. FINDINGS: Brain: No evidence of acute infarction, hemorrhage, hydrocephalus, extra-axial collection or mass lesion/mass effect. Vascular: Negative for hyperdense vessel Skull: Negative Sinuses/Orbits: Paranasal sinuses clear. Bilateral cataract extraction. No orbital lesion. Other: None IMPRESSION: Negative CT head Electronically Signed   By: Franchot Gallo M.D.   On: 01/08/2020 13:50   MR ANGIO HEAD WO CONTRAST  Result Date: 01/09/2020 CLINICAL DATA:  Acute neurologic deficit. Slurred speech and facial numbness EXAM: MRI HEAD WITHOUT CONTRAST MRA HEAD WITHOUT CONTRAST TECHNIQUE: Multiplanar, multiecho pulse sequences of the brain and surrounding structures were obtained without intravenous contrast. Angiographic images of the head were obtained using MRA  technique without contrast. COMPARISON:  None. FINDINGS: MRI HEAD FINDINGS Brain: There is a small amount of hemorrhage, likely subacute, within the left occipital lobe. It is unclear whether this is subarachnoid or intraparenchymal within the cortex. There are areas of hyperintense T2-weighted signal within the left temporal lobe. Normal volume of CSF spaces. No chronic microhemorrhage. Normal midline structures. Vascular: Normal flow voids. Skull and upper cervical spine: Normal marrow signal. Sinuses/Orbits: Negative. Other: None. MRA HEAD FINDINGS POSTERIOR CIRCULATION: --Vertebral arteries: Normal V4 segments. --Inferior cerebellar arteries: Normal. --Basilar artery: Normal. --Superior cerebellar arteries: Normal. --Posterior cerebral arteries: Multifocal stenosis of the distal left PCA. Normal right PCA. ANTERIOR CIRCULATION: --Intracranial internal carotid arteries: Normal. --Anterior cerebral arteries (ACA): Normal. Both A1  segments are present. Patent anterior communicating artery (a-comm). --Middle cerebral arteries (MCA): Normal. IMPRESSION: 1. Small amount of hemorrhage, likely subacute, within the left occipital lobe. Given the multifocal left PCA stenosis, this probably represents hemorrhage associated with a recent infarct. 2. Areas of hyperintense T2-weighted signal within the left temporal lobe may also be due to prior left PCA ischemic events. 3. Multifocal stenosis of the distal left PCA. Otherwise normal intracranial MRA. Electronically Signed   By: Ulyses Jarred M.D.   On: 01/09/2020 02:00   MR BRAIN WO CONTRAST  Result Date: 01/09/2020 CLINICAL DATA:  Acute neurologic deficit. Slurred speech and facial numbness EXAM: MRI HEAD WITHOUT CONTRAST MRA HEAD WITHOUT CONTRAST TECHNIQUE: Multiplanar, multiecho pulse sequences of the brain and surrounding structures were obtained without intravenous contrast. Angiographic images of the head were obtained using MRA technique without contrast.  COMPARISON:  None. FINDINGS: MRI HEAD FINDINGS Brain: There is a small amount of hemorrhage, likely subacute, within the left occipital lobe. It is unclear whether this is subarachnoid or intraparenchymal within the cortex. There are areas of hyperintense T2-weighted signal within the left temporal lobe. Normal volume of CSF spaces. No chronic microhemorrhage. Normal midline structures. Vascular: Normal flow voids. Skull and upper cervical spine: Normal marrow signal. Sinuses/Orbits: Negative. Other: None. MRA HEAD FINDINGS POSTERIOR CIRCULATION: --Vertebral arteries: Normal V4 segments. --Inferior cerebellar arteries: Normal. --Basilar artery: Normal. --Superior cerebellar arteries: Normal. --Posterior cerebral arteries: Multifocal stenosis of the distal left PCA. Normal right PCA. ANTERIOR CIRCULATION: --Intracranial internal carotid arteries: Normal. --Anterior cerebral arteries (ACA): Normal. Both A1 segments are present. Patent anterior communicating artery (a-comm). --Middle cerebral arteries (MCA): Normal. IMPRESSION: 1. Small amount of hemorrhage, likely subacute, within the left occipital lobe. Given the multifocal left PCA stenosis, this probably represents hemorrhage associated with a recent infarct. 2. Areas of hyperintense T2-weighted signal within the left temporal lobe may also be due to prior left PCA ischemic events. 3. Multifocal stenosis of the distal left PCA. Otherwise normal intracranial MRA. Electronically Signed   By: Ulyses Jarred M.D.   On: 01/09/2020 02:00    EKG: Independently reviewed.  Normal sinus rhythm.  Assessment/Plan Principal Problem:   Acute CVA (cerebrovascular accident) [facility]) Active Problems:   Nonischemic cardiomyopathy ([place])   Essential hypertension   Left bundle branch block   Dyslipidemia   Hypothyroidism   Intracranial bleeding ([place])    1. Acute CVA with hemorrhagic conversion -discussed with Dr. Leonel Ramsay on-call neurologist with this time requested  getting a CT angiogram of the head and neck.  No aspirin no anticoagulation at this time given the hemorrhagic conversion.  Check hemoglobin A1c lipid panel 2D echo continue to monitor on telemetry.  Physical therapy consult.  Patient passed swallow. 2. Nonischemic cardiomyopathy appears compensated.  On spironolactone and metoprolol. 3. Dyslipidemia on statins. 4. Hypothyroidism on Synthroid. 5. Hypertension on spironolactone and metoprolol. 6. Chronic back pain on Tylenol 3.  Is scheduled to get epidural injection on September 13.  Given that patient has acute CVA with hemorrhagic conversion will need close monitoring for any further worsening in inpatient status.   DVT prophylaxis: Due to hemorrhagic conversion avoiding anticoagulation.  SCDs for DVT prophylaxis now. Code Status: Full code. Family Communication: Discussed with patient. Disposition Plan: Home. Consults called: Neurology. Admission status: Inpatient.   Rise Patience MD [facility] Pager [phone].  If 7PM-7AM, please contact night-coverage [email or web address] Password [email or web address]  01/09/2020, 4:04 AM

## 2020-01-09 NOTE — Consult Note (Signed)
Neurology Consultation Reason for Consult: Stroke Referring Physician: Hal Hope, a  CC: Stroke  History is obtained from: Patient  HPI: Stefanie Camacho is a 74 y.o. female with a history of hypertension, hyperlipidemia who presents with transient episode of numbness that happened earlier this week.  She states that in was in the right side of her face.  It only lasted for a few minutes then resolved.  She was talking about this with a friend who finally convinced her to come get checked out and therefore she presented med [facility].  In med Center, CT was performed which was read as negative, and she was admitted for evaluation for possible TIA.   LKW: 4 days ago tpa given?: no, outside window   ROS: A 14 point ROS was performed and is negative except as noted in the HPI.   Past Medical History:  Diagnosis Date  . Anxiety   . Dyslipidemia   . Essential hypertension   . GERD (gastroesophageal reflux disease)   . Hypothyroidism   . Left bundle branch block 06/04/2018  . NICM (nonischemic cardiomyopathy) ([place])   . Nonischemic cardiomyopathy ([place]) 06/04/2018   Ejection fraction 45% in summer 2019  . Sleep apnea      Family History  Problem Relation Age of Onset  . Heart disease Mother   . Stroke Mother   . Heart disease Father   . Stroke Father   . Heart disease Maternal Grandfather   . Heart disease Paternal Grandfather      Social History:  reports that she has never smoked. She has never used smokeless tobacco. She reports previous alcohol use. She reports that she does not use drugs.   Exam: Current vital signs: BP (!) 162/61 (BP Location: Right Arm)   Pulse (!) 55   Temp 97.9 F (36.6 C) (Oral)   Resp 14   Ht 5\' 6"  (1.676 m)   Wt 87.8 kg   SpO2 97%   BMI 31.24 kg/m  Vital signs in last 24 hours: Temp:  [97.6 F (36.4 C)-97.9 F (36.6 C)] 97.9 F (36.6 C) (09/04 0041) Pulse Rate:  [55-69] 55 (09/04 0041) Resp:  [14-20] 14 (09/04 0041) BP:  (117-162)/(60-78) 162/61 (09/04 0041) SpO2:  [93 %-97 %] 97 % (09/04 0041) Weight:  [87.8 kg-89.8 kg] 87.8 kg (09/04 0041)   Physical Exam  Constitutional: Appears well-developed and well-nourished.  Psych: Affect appropriate to situation Eyes: No scleral injection HENT: No OP obstrucion MSK: no joint deformities.  Cardiovascular: Normal rate and regular rhythm.  Respiratory: Effort normal, non-labored breathing GI: Soft.  No distension. There is no tenderness.  Skin: WDI  Neuro: Mental Status: Patient is awake, alert, oriented to person, place, month, year, and situation. Patient is able to give a clear and coherent history. No signs of aphasia or neglect Cranial Nerves: II: Visual Fields are full. Pupils are equal, round, and reactive to light.   III,IV, VI: EOMI without ptosis or diploplia.  V: Facial sensation is symmetric to temperature VII: Facial movement is symmetric.  VIII: hearing is intact to voice X: Uvula elevates symmetrically XI: Shoulder shrug is symmetric. XII: tongue is midline without atrophy or fasciculations.  Motor: Tone is normal. Bulk is normal. 5/5 strength was present in all four extremities.  Sensory: Sensation is symmetric to light touch and temperature in the arms and legs. Cerebellar: FNF  intact bilaterally  I have reviewed labs in epic and the results pertinent to this consultation are: A1c 6.0 Creatinine  0.89  I have reviewed the images obtained: MRI brain-small hemorrhagic lesion in the left occipital region  Impression: 74 year old female with a small hemorrhagic lesion in the left occipital lobe.  I agree with radiology that most likely etiology is hemorrhagic infarct.  She is already multiple days out from onset of her symptoms.  On my review of the CT, I do think there may be some petechial looking hyperdensity in that region, though subtle.  Recommendations: 1) CTA head neck 2) lipids 3) echo, telemetry 4) hold antithrombotics  pending repeat CT 5) PT, OT, ST   Roland Rack, MD [facility] [phone]  If 7pm- 7am, please page neurology on call as listed in [place].

## 2020-01-09 NOTE — Progress Notes (Signed)
  Echocardiogram 2D Echocardiogram has been performed.  Stefanie Camacho 01/09/2020, 10:39 AM

## 2020-01-09 NOTE — Progress Notes (Signed)
STROKE TEAM PROGRESS NOTE   INTERVAL HISTORY No family is at the bedside.  Patient sitting in bed, no acute distress.  She recounted HPI with me.  About 10 days ago, patient had slurry speech on the phone with her friend, lasted about 15 minutes and resolved.  Wednesday night 10:30 PM she had right facial numbness lasting 15 seconds and resolved.   OBJECTIVE Vitals:   01/08/20 2100 01/08/20 2300 01/09/20 0041 01/09/20 0815  BP: 124/60 118/68 (!) 162/61 137/66  Pulse: 63 (!) 56 (!) 55 (!) 59  Resp: 20  14 17   Temp:   97.9 F (36.6 C) 97.8 F (36.6 C)  TempSrc:   Oral   SpO2: 93% 93% 97% 95%  Weight:   87.8 kg   Height:   5\' 6"  (1.676 m)     CBC:  Recent Labs  Lab 01/08/20 1316  WBC 8.1  NEUTROABS 4.8  HGB 14.6  HCT 45.3  MCV 87.8  PLT 222    Basic Metabolic Panel:  Recent Labs  Lab 01/08/20 1316  NA 135  K 4.7  CL 101  CO2 26  GLUCOSE 103*  BUN 23  CREATININE 0.89  CALCIUM 10.2    Lipid Panel:     Component Value Date/Time   CHOL 127 01/09/2020 0416   CHOL 133 07/17/2018 0933   TRIG 112 01/09/2020 0416   HDL 38 (L) 01/09/2020 0416   HDL 40 07/17/2018 0933   CHOLHDL 3.3 01/09/2020 0416   VLDL 22 01/09/2020 0416   LDLCALC 67 01/09/2020 0416   LDLCALC 67 07/17/2018 0933   HgbA1c:  Lab Results  Component Value Date   HGBA1C 6.0 (H) 01/09/2020   Urine Drug Screen: No results found for: LABOPIA, COCAINSCRNUR, LABBENZ, AMPHETMU, THCU, LABBARB  Alcohol Level No results found for: ETH  IMAGING  CT ANGIO HEAD W OR WO CONTRAST CT ANGIO NECK W OR WO CONTRAST 01/09/2020 IMPRESSION:  1. No emergent large vessel occlusion or high-grade stenosis of the intracranial arteries.  2. Innumerable nodular pulmonary metastases.   CT HEAD WO CONTRAST 01/08/2020 IMPRESSION:  Negative CT head   MR BRAIN WO CONTRAST MR ANGIO HEAD WO CONTRAST 01/09/2020 IMPRESSION:  1. Small amount of hemorrhage, likely subacute, within the left occipital lobe. Given the multifocal  left PCA stenosis, this probably represents hemorrhage associated with a recent infarct.  2. Areas of hyperintense T2-weighted signal within the left temporal lobe may also be due to prior left PCA ischemic events.  3. Multifocal stenosis of the distal left PCA. Otherwise normal intracranial MRA.   Transthoracic Echocardiogram  1. Left ventricular ejection fraction, by estimation, is 55 to 60%. The  left ventricle has normal function. The left ventricle has no regional  wall motion abnormalities. There is mild left ventricular hypertrophy.  Left ventricular diastolic parameters  are consistent with Grade I diastolic dysfunction (impaired relaxation).  2. Right ventricular systolic function is normal. The right ventricular  size is normal.  3. The mitral valve is grossly normal. Trivial mitral valve  regurgitation.  4. The aortic valve is grossly normal. Aortic valve regurgitation is not  visualized. No aortic stenosis is present.    ECG - SR rate 69 BPM. Probable old anterior infarct. (See cardiology reading for complete details)  PHYSICAL EXAM  Temp:  [97.8 F (36.6 C)-97.9 F (36.6 C)] 97.9 F (36.6 C) (09/04 1617) Pulse Rate:  [55-65] 65 (09/04 1617) Resp:  [14-20] 17 (09/04 1617) BP: (117-162)/(60-68) 123/62 (09/04 1617) SpO2:  [  93 %-97 %] 96 % (09/04 1617) Weight:  [87.8 kg] 87.8 kg (09/04 0041)  General - Well nourished, well developed, in no apparent distress.  Ophthalmologic - fundi not visualized due to noncooperation.  Cardiovascular - Regular rhythm and rate.  Mental Status -  Level of arousal and orientation to time, place, and person were intact. Language including expression, naming, repetition, comprehension was assessed and found intact. Fund of Knowledge was assessed and was intact.  Cranial Nerves II - XII - II - Visual field intact OU. III, IV, VI - Extraocular movements intact. V - Facial sensation intact bilaterally. VII - Facial movement intact  bilaterally. VIII - Hearing & vestibular intact bilaterally. X - Palate elevates symmetrically. XI - Chin turning & shoulder shrug intact bilaterally. XII - Tongue protrusion intact.  Motor Strength - The patient's strength was normal in all extremities and pronator drift was absent.  Bulk was normal and fasciculations were absent.   Motor Tone - Muscle tone was assessed at the neck and appendages and was normal.  Reflexes - The patient's reflexes were symmetrical in all extremities and she had no pathological reflexes.  Sensory - Light touch, temperature/pinprick were assessed and were symmetrical.    Coordination - The patient had normal movements in the hands and feet with no ataxia or dysmetria.  Tremor was absent.  Gait and Station - deferred.   ASSESSMENT/PLAN [name] is a 74 y.o. 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 She did not receive IV t-PA due to late presentation (>4.5 hours from time of onset) and resolution of deficits.  Stroke: Subacute left PCA, MCA/PCA infarcts with hemorrhagic conversion, embolic pattern, etiology unclear  CT head - Negative CT head   MRI head - Small amount of hemorrhage, likely subacute, within the left occipital lobe. Areas of hyperintense T2-weighted signal within the left temporal lobe may also be due to prior left PCA ischemic events.   MRA head - Multifocal stenosis of the distal left PCA.  CTA H&N - No emergent large vessel occlusion or high-grade stenosis of the intracranial arteries.  LE venous Doppler negative for DVT  2D Echo EF 55 to 60%  [employer] Virus 2 - negative  LDL - 67  HgbA1c - 6.0  VTE prophylaxis - SCDs  aspirin 81 mg daily prior to admission, now on aspirin 81 mg daily.  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   Pan CT pending  Hypertension  Home BP meds: Losartan ; metoprolol ; spironolactone  Current BP meds: Losartan ; metoprolol ; spironolactone  Stable . Long-term BP goal normotensive  Hyperlipidemia  Home Lipid lowering medication: Lipitor 10 mg daily  LDL 67, goal < 70  Current lipid lowering medication: Lipitor 10 mg daily   Continue statin at discharge  Other Stroke Risk Factors  Advanced age  Previous ETOH use  Obesity, Body mass index is 31.24 kg/m., recommend weight loss, diet and exercise as appropriate   Family hx stroke (mother and father)   Other Active Problems  Code status - Full code   Hospital day # 0  Rosalin Hawking, MD PhD Stroke Neurology 01/09/2020 6:26 PM   To contact Stroke Continuity provider, please refer to [email or web address] After hours, contact General Neurology

## 2020-01-09 NOTE — Evaluation (Signed)
Physical Therapy Evaluation Patient Details Name: Stefanie Camacho MRN: [id number] DOB: 01-Nov-1945 Today's Date: 01/09/2020   History of Present Illness  74 year old female with a small hemorrhagic lesion in the left occipital lobe. Presented with transient episode of numbness that happened earlier this week.  She states that in was in the right side of her face.  It only lasted for a few minutes then resolved.  MRI showes hemorrhage resulting from L temporal infarct.  Also showing narrowing of posterior circulation.  Clinical Impression  Pt admitted with/for s/s of stroke which resolved.  Pt mobilizing at a mod I to Supervision level limited most by pain from spinal stenosis shown on MRI.  Pt currently limited functionally due to the problems listed below.  (see problems list.)  Pt will benefit from PT to maximize function and safety to be able to get home safely with available assist.     Follow Up Recommendations Other (comment);Outpatient PT (OPPT for strengthening for back.  Not related to stroke )    Equipment Recommendations  None recommended by PT    Recommendations for Other Services       Precautions / Restrictions Precautions Precautions: Fall      Mobility  Bed Mobility Overal bed mobility: Needs Assistance Bed Mobility: Supine to Sit;Sit to Supine     Supine to sit: Modified independent (Device/Increase time) Sit to supine: Modified independent (Device/Increase time)   General bed mobility comments: slow, painful appearing movement, but no assist needed.  up via L elbow.  Transfers Overall transfer level: Needs assistance   Transfers: Sit to/from Stand Sit to Stand: Modified independent (Device/Increase time)         General transfer comment: slow with use of hands.  Ambulation/Gait Ambulation/Gait assistance: Supervision Gait Distance (Feet): 300 Feet Assistive device: Rolling walker (2 wheeled);None Gait Pattern/deviations: Step-through pattern   Gait  velocity interpretation: 1.31 - 2.62 ft/sec, indicative of limited community ambulator General Gait Details: mildly antalgic L>R, more guarded if AD not used.  Generally stiff and moving slower than age appropriate.  Stairs            Wheelchair Mobility    Modified Rankin (Stroke Patients Only) Modified Rankin (Stroke Patients Only) Pre-Morbid Rankin Score: No symptoms Modified Rankin: No symptoms     Balance Overall balance assessment: Needs assistance   Sitting balance-Leahy Scale: Good       Standing balance-Leahy Scale: Good                   Standardized Balance Assessment Standardized Balance Assessment : Berg Balance Test Berg Balance Test Sit to Stand: Able to stand  independently using hands Standing Unsupported: Able to stand 2 minutes with supervision Sitting with Back Unsupported but Feet Supported on Floor or Stool: Able to sit 2 minutes under supervision Stand to Sit: Sits safely with minimal use of hands Transfers: Able to transfer safely, minor use of hands Standing Unsupported with Eyes Closed: Able to stand 10 seconds with supervision Standing Ubsupported with Feet Together: Able to place feet together independently and stand for 1 minute with supervision From Standing, Reach Forward with Outstretched Arm: Can reach confidently >25 cm (10") From Standing Position, Pick up Object from Floor: Able to pick up shoe, needs supervision From Standing Position, Turn to Look Behind Over each Shoulder: Looks behind one side only/other side shows less weight shift Turn 360 Degrees: Able to turn 360 degrees safely one side only in 4 seconds or less Standing Unsupported, Alternately  Place Feet on Step/Stool: Able to stand independently and complete 8 steps >20 seconds Standing Unsupported, One Foot in Front: Able to plae foot ahead of the other independently and hold 30 seconds Standing on One Leg: Able to lift leg independently and hold equal to or more than  3 seconds Total Score: 44         Pertinent Vitals/Pain Pain Assessment: Faces Faces Pain Scale: Hurts even more Pain Location: back, buttock hips, L>R Pain Descriptors / Indicators: Aching;Burning;Discomfort;Grimacing;Guarding Pain Intervention(s): Limited activity within patient's tolerance;Monitored during session;Patient requesting pain meds-RN notified    Home Living Family/patient expects to be discharged to:: Private residence Living Arrangements: Alone Available Help at Discharge: Available PRN/intermittently Type of Home: House Home Access: Stairs to enter Entrance Stairs-Rails: Psychiatric nurse of Steps: 1/1 Home Layout: One level Home Equipment: Environmental consultant - 2 wheels      Prior Function Level of Independence: Independent with assistive device(s)               Hand Dominance   Dominant Hand: Right    Extremity/Trunk Assessment   Upper Extremity Assessment Upper Extremity Assessment: Overall WFL for tasks assessed    Lower Extremity Assessment Lower Extremity Assessment: Overall WFL for tasks assessed;RLE deficits/detail;LLE deficits/detail RLE Deficits / Details: hip flexor weakness at 3/5 with pain. LLE Deficits / Details: hip flexor weakness at 3/5 with pain.       Communication   Communication: No difficulties  Cognition Arousal/Alertness: Awake/alert Behavior During Therapy: WFL for tasks assessed/performed Overall Cognitive Status: Within Functional Limits for tasks assessed                                        General Comments General comments (skin integrity, edema, etc.): Instructed pt in learning about her individual risk factors for stroke and Instructed in BE FAST signs of stroke.    Exercises     Assessment/Plan    PT Assessment Patient needs continued PT services  PT Problem List Decreased strength;Decreased balance;Decreased mobility;Decreased knowledge of use of DME;Pain;Decreased activity  tolerance       PT Treatment Interventions Gait training;Functional mobility training;Therapeutic activities;Balance training;Neuromuscular re-education;Patient/family education;DME instruction    PT Goals (Current goals can be found in the Care Plan section)  Acute Rehab PT Goals Patient Stated Goal: deal with my back issues PT Goal Formulation: With patient Time For Goal Achievement: 01/16/20 Potential to Achieve Goals: Good    Frequency Min 3X/week   Barriers to discharge        Co-evaluation               AM-PAC PT "6 Clicks" Mobility  Outcome Measure Help needed turning from your back to your side while in a flat bed without using bedrails?: None Help needed moving from lying on your back to sitting on the side of a flat bed without using bedrails?: None Help needed moving to and from a bed to a chair (including a wheelchair)?: None Help needed standing up from a chair using your arms (e.g., wheelchair or bedside chair)?: None Help needed to walk in hospital room?: None Help needed climbing 3-5 steps with a railing? : A Lot 6 Click Score: 22    End of Session   Activity Tolerance: Patient tolerated treatment well;Patient limited by pain Patient left: in bed Nurse Communication: Mobility status PT Visit Diagnosis: Other abnormalities of gait and mobility (  R26.89);Muscle weakness (generalized) (M62.81);Other symptoms and signs involving the nervous system (R29.898)    Time: 0263-7858 PT Time Calculation (min) (ACUTE ONLY): 34 min   Charges:   PT Evaluation $PT Eval Moderate Complexity: 1 Mod PT Treatments $Gait Training: 8-22 mins        01/09/2020  Ginger Carne., PT Acute Rehabilitation Services [phone]  (pager) [phone]  (office)  Tessie Fass Mynor Witkop 01/09/2020, 4:49 PM

## 2020-01-10 ENCOUNTER — Inpatient Hospital Stay (HOSPITAL_COMMUNITY): Payer: Medicare HMO

## 2020-01-10 ENCOUNTER — Encounter (HOSPITAL_COMMUNITY): Payer: Self-pay | Admitting: Internal Medicine

## 2020-01-10 DIAGNOSIS — E785 Hyperlipidemia, unspecified: Secondary | ICD-10-CM

## 2020-01-10 LAB — COMPREHENSIVE METABOLIC PANEL
ALT: 16 U/L (ref 0–44)
AST: 18 U/L (ref 15–41)
Albumin: 3.3 g/dL — ABNORMAL LOW (ref 3.5–5.0)
Alkaline Phosphatase: 223 U/L — ABNORMAL HIGH (ref 38–126)
Anion gap: 9 (ref 5–15)
BUN: 24 mg/dL — ABNORMAL HIGH (ref 8–23)
CO2: 26 mmol/L (ref 22–32)
Calcium: 10 mg/dL (ref 8.9–10.3)
Chloride: 102 mmol/L (ref 98–111)
Creatinine, Ser: 1.15 mg/dL — ABNORMAL HIGH (ref 0.44–1.00)
GFR calc Af Amer: 55 mL/min — ABNORMAL LOW (ref >60)
GFR calc non Af Amer: 47 mL/min — ABNORMAL LOW (ref >60)
Glucose, Bld: 104 mg/dL — ABNORMAL HIGH (ref 70–99)
Potassium: 4.3 mmol/L (ref 3.5–5.1)
Sodium: 137 mmol/L (ref 135–145)
Total Bilirubin: 1 mg/dL (ref 0.3–1.2)
Total Protein: 6.5 g/dL (ref 6.5–8.1)

## 2020-01-10 MED ORDER — GADOBUTROL 1 MMOL/ML IV SOLN
10.0000 mL | Freq: Once | INTRAVENOUS | Status: AC | PRN
  Administered 2020-01-10: 10 mL via INTRAVENOUS

## 2020-01-10 MED ORDER — IOHEXOL 300 MG/ML  SOLN
100.0000 mL | Freq: Once | INTRAMUSCULAR | Status: AC | PRN
  Administered 2020-01-10: 100 mL via INTRAVENOUS

## 2020-01-10 MED ORDER — IOHEXOL 9 MG/ML PO SOLN
ORAL | Status: AC
  Administered 2020-01-10: 500 mL via ORAL
  Filled 2020-01-10: qty 1000

## 2020-01-10 MED ORDER — IOHEXOL 9 MG/ML PO SOLN
500.0000 mL | ORAL | Status: AC
  Administered 2020-01-10: 500 mL via ORAL

## 2020-01-10 NOTE — Progress Notes (Signed)
STROKE TEAM PROGRESS NOTE   INTERVAL HISTORY Pt sitting in chair, no family at bedside. She just came back from MRI brain with contrast. Pt this am pan CT showed lung cancer metastasis to lung and spine. Now MRI with contrast to rule out brain mets.  OBJECTIVE Vitals:   01/09/20 0815 01/09/20 1617 01/09/20 2045 01/10/20 0752  BP: 137/66 123/62 (!) 100/58 135/71  Pulse: (!) 59 65 63 60  Resp: 17 17 15 18   Temp: 97.8 F (36.6 C) 97.9 F (36.6 C) 98 F (36.7 C) 97.7 F (36.5 C)  TempSrc:   Oral   SpO2: 95% 96% 95% 99%  Weight:      Height:        CBC:  Recent Labs  Lab 01/08/20 1316  WBC 8.1  NEUTROABS 4.8  HGB 14.6  HCT 45.3  MCV 87.8  PLT 277    Basic Metabolic Panel:  Recent Labs  Lab 01/08/20 1316 01/10/20 0343  NA 135 137  K 4.7 4.3  CL 101 102  CO2 26 26  GLUCOSE 103* 104*  BUN 23 24*  CREATININE 0.89 1.15*  CALCIUM 10.2 10.0    Lipid Panel:     Component Value Date/Time   CHOL 127 01/09/2020 0416   CHOL 133 07/17/2018 0933   TRIG 112 01/09/2020 0416   HDL 38 (L) 01/09/2020 0416   HDL 40 07/17/2018 0933   CHOLHDL 3.3 01/09/2020 0416   VLDL 22 01/09/2020 0416   LDLCALC 67 01/09/2020 0416   LDLCALC 67 07/17/2018 0933   HgbA1c:  Lab Results  Component Value Date   HGBA1C 6.0 (H) 01/09/2020   Urine Drug Screen: No results found for: LABOPIA, COCAINSCRNUR, LABBENZ, AMPHETMU, THCU, LABBARB  Alcohol Level No results found for: ETH  IMAGING  CT ANGIO HEAD W OR WO CONTRAST CT ANGIO NECK W OR WO CONTRAST 01/09/2020 IMPRESSION:  1. No emergent large vessel occlusion or high-grade stenosis of the intracranial arteries.  2. Innumerable nodular pulmonary metastases.   CT HEAD WO CONTRAST 01/08/2020 IMPRESSION:  Negative CT head   MR BRAIN WO CONTRAST MR ANGIO HEAD WO CONTRAST 01/09/2020 IMPRESSION:  1. Small amount of hemorrhage, likely subacute, within the left occipital lobe. Given the multifocal left PCA stenosis, this probably represents  hemorrhage associated with a recent infarct.  2. Areas of hyperintense T2-weighted signal within the left temporal lobe may also be due to prior left PCA ischemic events.  3. Multifocal stenosis of the distal left PCA. Otherwise normal intracranial MRA.   CT CHEST ABDOMEN AND PELVIS W CONTRAST 01/10/20 IMPRESSION: 1. Dominant mass in the left lower lobe consistent with a primary bronchogenic carcinoma. This measures approximately 7.8 x 4.2 x 4.8 cm. 2. Extensive metastatic disease to both lungs with numerous lung nodules throughout all lobes. 2.0 x 1.1 cm soft tissue mass versus a mildly enlarged lymph node in the anterior mediastinum anterior to the heart, possibly metastatic disease. 3. Skeletal metastatic disease with lucent bone lesions, the most definitively seen within the right T5 transverse process, the sacrum, right ilium and left anterior acetabulum/pubis. 4. No other evidence of metastatic disease. 5. No acute findings. 6. 3 cm right liver lobe hemangioma.  Transthoracic Echocardiogram  1. Left ventricular ejection fraction, by estimation, is 55 to 60%. The  left ventricle has normal function. The left ventricle has no regional  wall motion abnormalities. There is mild left ventricular hypertrophy.  Left ventricular diastolic parameters  are consistent with Grade I diastolic dysfunction (impaired  relaxation).  2. Right ventricular systolic function is normal. The right ventricular  size is normal.  3. The mitral valve is grossly normal. Trivial mitral valve  regurgitation.  4. The aortic valve is grossly normal. Aortic valve regurgitation is not  visualized. No aortic stenosis is present.    ECG - SR rate 69 BPM. Probable old anterior infarct. (See cardiology reading for complete details)  PHYSICAL EXAM  Temp:  [97.7 F (36.5 C)-98 F (36.7 C)] 97.7 F (36.5 C) (09/05 0752) Pulse Rate:  [60-65] 60 (09/05 0752) Resp:  [15-18] 18 (09/05 0752) BP: (100-135)/(58-71)  135/71 (09/05 0752) SpO2:  [95 %-99 %] 99 % (09/05 0752)  General - Well nourished, well developed, in no apparent distress.  Ophthalmologic - fundi not visualized due to noncooperation.  Cardiovascular - Regular rhythm and rate.  Mental Status -  Level of arousal and orientation to time, place, and person were intact. Language including expression, naming, repetition, comprehension was assessed and found intact. Fund of Knowledge was assessed and was intact.  Cranial Nerves II - XII - II - Visual field intact OU. III, IV, VI - Extraocular movements intact. V - Facial sensation intact bilaterally. VII - Facial movement intact bilaterally. VIII - Hearing & vestibular intact bilaterally. X - Palate elevates symmetrically. XI - Chin turning & shoulder shrug intact bilaterally. XII - Tongue protrusion intact.  Motor Strength - The patient's strength was normal in all extremities and pronator drift was absent.  Bulk was normal and fasciculations were absent.   Motor Tone - Muscle tone was assessed at the neck and appendages and was normal.  Reflexes - The patient's reflexes were symmetrical in all extremities and she had no pathological reflexes.  Sensory - Light touch, temperature/pinprick were assessed and were symmetrical.    Coordination - The patient had normal movements in the hands and feet with no ataxia or dysmetria.  Tremor was absent.  Gait and Station - deferred.   ASSESSMENT/PLAN [name] is a 74 y.o. 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 She did not receive IV t-PA due to late presentation (>4.5 hours from time of onset) and resolution of deficits.  Stroke vs. Brain mets: hyper T2 signals at left occipital and occipitotemporal regions with one area small subacute hemorrhage  CT head - Negative CT head   MRI head - Small amount of hemorrhage, likely subacute, within the  left occipital lobe. Areas of hyperintense T2-weighted signal within the left temporal lobe may also be due to prior left PCA ischemic events.   MRA head - Multifocal stenosis of the distal left PCA.  CTA H&N - No emergent large vessel occlusion or high-grade stenosis of the intracranial arteries.  MRI brain with contrast pending  LE venous Doppler negative for DVT  2D Echo EF 55 to 60%  [employer] Virus 2 - negative  LDL - 67  HgbA1c - 6.0  VTE prophylaxis - SCDs  aspirin 81 mg daily prior to admission, now on aspirin 81 mg daily.  Ongoing aggressive stroke risk factor management  Therapy recommendations:  Outpt PT recommended  Disposition:  Pending  Metastasis lung cancer  Pt reported intermittent coughing for the last 3 to 4 months  CTA neck showed innumerable nodular pulmonary metastases.   CT Chest Abd and Pelvis - 9/5 - Dominant mass in the left lower lobe consistent with a primary bronchogenic carcinoma. This measures approximately 7.8 x 4.2  x 4.8 cm. Extensive metastatic disease to both lungs with numerous lung nodules throughout all lobes. 2.0 x 1.1 cm soft tissue mass versus a mildly enlarged lymph node in the anterior mediastinum anterior to the heart, possibly metastatic disease. Skeletal metastatic disease with lucent bone lesions, the most definitively seen within the right T5 transverse process, the sacrum, right ilium and left anterior acetabulum/pubis.  MRI brain with contrast pending  Oncology and pulmonology consulted  Hypertension  Home BP meds: Losartan ; metoprolol ; spironolactone  Current BP meds: Losartan ; metoprolol ; spironolactone  Stable . Long-term BP goal normotensive  Hyperlipidemia  Home Lipid lowering medication: Lipitor 10 mg daily  LDL 67, goal < 70  Current lipid lowering medication: Lipitor 10 mg daily   Continue statin at discharge  Other Stroke Risk Factors  Advanced age  Previous ETOH use  Obesity, Body mass  index is 31.24 kg/m., recommend weight loss, diet and exercise as appropriate   Family hx stroke (mother and father)   Other Active Problems  Code status - Full code   AKI - creatinine - 0.89->1.15  Hospital day # 1  Rosalin Hawking, MD PhD Stroke Neurology 01/10/2020 4:10 PM   To contact Stroke Continuity provider, please refer to [email or web address] After hours, contact General Neurology

## 2020-01-10 NOTE — Progress Notes (Signed)
Physical Therapy Treatment Patient Details Name: Stefanie Camacho MRN: [id number] DOB: [birth year] Today's Date: 01/10/2020    History of Present Illness 74 year old female with a small hemorrhagic lesion in the left occipital lobe. Presented with transient episode of numbness.  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    PT Comments    Patient progressing well towards PT goals. In good spirits despite news about CT scan. Reports back pain is controlled this afternoon. Tolerated ambulation with use of RW for support. VSS on RA with Sp02 >94% on RA with 1-2/4 DOE. Session cut short as transport arrived to take pt down for MRI.  Will continue to follow for higher level balance and endurance training.    Follow Up Recommendations  Outpatient PT     Equipment Recommendations  None recommended by PT    Recommendations for Other Services       Precautions / Restrictions Precautions Precautions: Fall Precaution Comments: new diagonsis of lung ca with mets Restrictions Weight Bearing Restrictions: No    Mobility  Bed Mobility               General bed mobility comments: Up in chair upon PT arrival.  Transfers Overall transfer level: Needs assistance Equipment used: None Transfers: Sit to/from Stand Sit to Stand: Modified independent (Device/Increase time)         General transfer comment: Stood from chair x1 without difficulty.  Ambulation/Gait Ambulation/Gait assistance: Supervision Gait Distance (Feet): 150 Feet Assistive device: Rolling walker (2 wheeled) Gait Pattern/deviations: Step-through pattern;Decreased stride length   Gait velocity interpretation:  1.31 - 2.62 ft/sec, indicative of limited community ambulator General Gait Details: Slow, mildly antaglic gait with use of RW; 2/4 DOE. Sp02 remained >93% on RA.   Stairs             Wheelchair Mobility    Modified Rankin (Stroke Patients Only) Modified Rankin (Stroke Patients Only) Pre-Morbid Rankin Score: No symptoms Modified Rankin: No symptoms     Balance Overall balance assessment: Needs assistance Sitting-balance support: Feet supported;No upper extremity supported Sitting balance-Leahy Scale: Good     Standing balance support: During functional activity Standing balance-Leahy Scale: Good Standing balance comment: Able to stand without UE support, prefers UE support for walking                            Cognition Arousal/Alertness: Awake/alert Behavior During Therapy: WFL for tasks assessed/performed Overall Cognitive Status: Within Functional Limits for tasks assessed                                        Exercises      General Comments        Pertinent Vitals/Pain Pain Assessment: No/denies pain    Home Living                      Prior Function            PT Goals (current goals can now be found in the care plan section) Progress towards PT goals: Progressing toward goals    Frequency  Min 3X/week      PT Plan Current plan remains appropriate    Co-evaluation              AM-PAC PT "6 Clicks" Mobility   Outcome Measure  Help needed turning from your back to your side while in a flat bed without using bedrails?: None Help needed moving from lying on your back to sitting on the side of a flat bed without using bedrails?: None Help needed moving to and from a bed to a chair (including a wheelchair)?: None Help needed standing up from a chair using your arms (e.g., wheelchair or bedside chair)?: None Help needed to walk in hospital room?: None Help needed climbing 3-5 steps with a railing?  : A Little 6 Click Score: 23    End of Session   Activity Tolerance: Patient tolerated treatment well Patient left: Other (comment) (transport chair with transport personnel to go down for MRI) Nurse Communication: Mobility status PT Visit Diagnosis: Other abnormalities of gait and mobility (R26.89);Muscle weakness (generalized) (M62.81);Other symptoms and signs involving the nervous system (R29.898)     Time: 0940-7680 PT Time Calculation (min) (ACUTE ONLY): 14 min  Charges:  $Therapeutic Exercise: 8-22 mins                     Marisa Severin, PT, DPT Acute Rehabilitation Services Pager [phone] Office [phone]       Marguarite Arbour A Sabra Heck 01/10/2020, 3:34 PM

## 2020-01-10 NOTE — Progress Notes (Signed)
[facility]  PROGRESS NOTE  Stefanie Camacho SHF:[id number] DOB: 02/18/1946 DOA: 01/08/2020 PCP: [name], MD   Brief HPI:    74 year old female with a history of nonischemic cardiomyopathy, hypothyroidism, hypertension who experienced slurred speech about a week ago when she was talking to her friend on phone.  This lasted for about 15 minutes and resolved.  About 4 days ago patient had right facial numbness which lasted for few seconds and resolved.  She was advised to come to the ED.  She did not have weakness of upper or lower extremities difficulty swallowing or any visual symptoms.  In the ED CT head was unremarkable MRI of the brain and MRI of the brain showed hemorrhagic conversion of stroke.  Neurology was consulted. CTA neck showed multiple pulmonary nodules, CT chest/abdomen/pelvis was obtained which showed large left lung mass with pulmonary metastasis, consistent with bronchogenic carcinoma. There is also metastasis to spine and ileum, left acetabulum.   Subjective   Patient seen and examined, patient has been having back pain for past 3 to 4 months. CT chest showed large left lung mass with pulmonary metastasis and also metastasis to spine, sacrum, ilium and left acetabulum.   Assessment/Plan:     1. Acute CVA with hemorrhagic conversion-continue aspirin 81 mg daily, Lipitor 10 mg daily. Patient has multifocal stenosis of distal left PCA seen on MRA. Nephrology is following. Lower extremity venous Dopplers negative for DVT. 2. Metastatic lung cancer-CT chest/abdomen/pelvis showed large left lung mass with mets to lungs, spine. Called and discussed with oncologist on call, Dr. Lorenso Courier, he recommends pulmonology evaluation for bronchoscopy. Called and discussed with pulmonology, they will see patient in a.m. 3. Hypertension-blood pressures controlled, continue metoprolol, losartan, spironolactone 4. Hyperlipidemia-continue Lipitor 10 mg daily 5. Hypothyroidism-continue  Synthroid      COVID-19 Labs  No results for input(s): DDIMER, FERRITIN, LDH, CRP in the last 72 hours.  Lab Results  Component Value Date   [place] NEGATIVE 01/08/2020     Scheduled medications:   . aspirin EC  81 mg Oral Daily  . atorvastatin  10 mg Oral q1800  . FLUoxetine  10 mg Oral Daily  . levothyroxine  75 mcg Oral Daily  . losartan  50 mg Oral Daily  . metoprolol succinate  50 mg Oral Daily  . pantoprazole  40 mg Oral Daily  . spironolactone  12.5 mg Oral Daily         CBG: Recent Labs  Lab 01/08/20 1347  GLUCAP 96    SpO2: 99 %    CBC: Recent Labs  Lab 01/08/20 1316  WBC 8.1  NEUTROABS 4.8  HGB 14.6  HCT 45.3  MCV 87.8  PLT 502    Basic Metabolic Panel: Recent Labs  Lab 01/08/20 1316 01/10/20 0343  NA 135 137  K 4.7 4.3  CL 101 102  CO2 26 26  GLUCOSE 103* 104*  BUN 23 24*  CREATININE 0.89 1.15*  CALCIUM 10.2 10.0     Liver Function Tests: Recent Labs  Lab 01/08/20 1316 01/10/20 0343  AST 15 18  ALT 18 16  ALKPHOS 243* 223*  BILITOT 0.7 1.0  PROT 7.8 6.5  ALBUMIN 3.9 3.3*     Antibiotics: Anti-infectives (From admission, onward)   None       DVT prophylaxis: SCDs  Code Status: Full code  Family Communication: No family at bedside    Status is: Inpatient  Dispo: The patient is from: Home  Anticipated d/c is to: Home              Anticipated d/c date is: 01/12/2020              Patient currently not stable for discharge  Barrier to discharge-ongoing work-up for left lung mass     Consultants:  Neurology  Procedures:     Objective   Vitals:   01/09/20 0815 01/09/20 1617 01/09/20 2045 01/10/20 0752  BP: 137/66 123/62 (!) 100/58 135/71  Pulse: (!) 59 65 63 60  Resp: 17 17 15 18   Temp: 97.8 F (36.6 C) 97.9 F (36.6 C) 98 F (36.7 C) 97.7 F (36.5 C)  TempSrc:   Oral   SpO2: 95% 96% 95% 99%  Weight:      Height:        Intake/Output Summary (Last 24 hours) at  01/10/2020 1501 Last data filed at 01/10/2020 0730 Gross per 24 hour  Intake 240 ml  Output --  Net 240 ml    No intake/output data recorded.  Filed Weights   01/08/20 1312 01/09/20 0041  Weight: 89.8 kg 87.8 kg    Physical Examination:    General: Appears in no acute distress  Cardiovascular: S1-S2, regular, no murmur auscultated  Respiratory: Clear to auscultation bilaterally, no wheezing or crackles auscultated  Abdomen: Abdomen is soft, nontender, no organomegaly  Extremities: No edema in the lower extremities  Neurologic: Cranial nerves II through XII grossly intact, no focal deficit noted    Data Reviewed:   Recent Results (from the past 240 hour(s))  SARS Coronavirus 2 by RT PCR (hospital order, performed in [place] hospital lab) Nasopharyngeal Nasopharyngeal Swab     Status: None   Collection Time: 01/08/20  4:28 PM   Specimen: Nasopharyngeal Swab  Result Value Ref Range Status   SARS Coronavirus 2 NEGATIVE NEGATIVE Final    Comment: (NOTE) SARS-CoV-2 target nucleic acids are NOT DETECTED.  The SARS-CoV-2 RNA is generally detectable in upper and lower respiratory specimens during the acute phase of infection. The lowest concentration of SARS-CoV-2 viral copies this assay can detect is 250 copies / mL. A negative result does not preclude SARS-CoV-2 infection and should not be used as the sole basis for treatment or other patient management decisions.  A negative result may occur with improper specimen collection / handling, submission of specimen other than nasopharyngeal swab, presence of viral mutation(s) within the areas targeted by this assay, and inadequate number of viral copies (<250 copies / mL). A negative result must be combined with clinical observations, patient history, and epidemiological information.  Fact Sheet for Patients:   StrictlyIdeas.no  Fact Sheet for Healthcare  Providers: BankingDealers.co.za  This test is not yet approved or  cleared by the [place] FDA and has been authorized for detection and/or diagnosis of SARS-CoV-2 by FDA under an Emergency Use Authorization (EUA).  This EUA will remain in effect (meaning this test can be used) for the duration of the COVID-19 declaration under Section 564(b)(1) of the Act, 21 U.S.C. section 360bbb-3(b)(1), unless the authorization is terminated or revoked sooner.  Performed at [facility], [address] [place], [place] [address]     No results for input(s): LIPASE, AMYLASE in the last 168 hours. No results for input(s): AMMONIA in the last 168 hours.  Cardiac Enzymes: No results for input(s): CKTOTAL, CKMB, CKMBINDEX, TROPONINI in the last 168 hours. BNP (last 3 results) No results for input(s): BNP in the  last 8760 hours.  ProBNP (last 3 results) No results for input(s): PROBNP in the last 8760 hours.  Studies:  CT ANGIO HEAD W OR WO CONTRAST  Result Date: 01/09/2020 CLINICAL DATA:  Stroke EXAM: CT ANGIOGRAPHY HEAD AND NECK TECHNIQUE: Multidetector CT imaging of the head and neck was performed using the standard protocol during bolus administration of intravenous contrast. Multiplanar CT image reconstructions and MIPs were obtained to evaluate the vascular anatomy. Carotid stenosis measurements (when applicable) are obtained utilizing NASCET criteria, using the distal internal carotid diameter as the denominator. CONTRAST:  16mL OMNIPAQUE IOHEXOL 350 MG/ML SOLN COMPARISON:  None. FINDINGS: CTA NECK FINDINGS SKELETON: There is no bony spinal canal stenosis. No lytic or blastic lesion. OTHER NECK: Normal pharynx, larynx and major salivary glands. No cervical lymphadenopathy. Unremarkable thyroid gland. UPPER CHEST: Innumerable nodules throughout both lungs. AORTIC ARCH: There is no calcific atherosclerosis of the aortic arch. There is no aneurysm, dissection or  hemodynamically significant stenosis of the visualized portion of the aorta. Normal variant aortic arch branching pattern with the brachiocephalic and left common carotid arteries sharing a common origin. The visualized proximal subclavian arteries are widely patent. RIGHT CAROTID SYSTEM: Normal without aneurysm, dissection or stenosis. LEFT CAROTID SYSTEM: Normal without aneurysm, dissection or stenosis. VERTEBRAL ARTERIES: Left dominant configuration. Both origins are clearly patent. There is no dissection, occlusion or flow-limiting stenosis to the skull base (V1-V3 segments). CTA HEAD FINDINGS POSTERIOR CIRCULATION: --Vertebral arteries: Normal V4 segments. --Inferior cerebellar arteries: Normal. --Basilar artery: Normal. --Superior cerebellar arteries: Normal. --Posterior cerebral arteries (PCA): Normal. ANTERIOR CIRCULATION: --Intracranial internal carotid arteries: Normal. --Anterior cerebral arteries (ACA): Normal. Both A1 segments are present. Patent anterior communicating artery (a-comm). --Middle cerebral arteries (MCA): Normal. VENOUS SINUSES: As permitted by contrast timing, patent. ANATOMIC VARIANTS: None Review of the MIP images confirms the above findings. IMPRESSION: 1. No emergent large vessel occlusion or high-grade stenosis of the intracranial arteries. 2. Innumerable nodular pulmonary metastases. Electronically Signed   By: Ulyses Jarred M.D.   On: 01/09/2020 05:17   CT ANGIO NECK W OR WO CONTRAST  Result Date: 01/09/2020 CLINICAL DATA:  Stroke EXAM: CT ANGIOGRAPHY HEAD AND NECK TECHNIQUE: Multidetector CT imaging of the head and neck was performed using the standard protocol during bolus administration of intravenous contrast. Multiplanar CT image reconstructions and MIPs were obtained to evaluate the vascular anatomy. Carotid stenosis measurements (when applicable) are obtained utilizing NASCET criteria, using the distal internal carotid diameter as the denominator. CONTRAST:  32mL  OMNIPAQUE IOHEXOL 350 MG/ML SOLN COMPARISON:  None. FINDINGS: CTA NECK FINDINGS SKELETON: There is no bony spinal canal stenosis. No lytic or blastic lesion. OTHER NECK: Normal pharynx, larynx and major salivary glands. No cervical lymphadenopathy. Unremarkable thyroid gland. UPPER CHEST: Innumerable nodules throughout both lungs. AORTIC ARCH: There is no calcific atherosclerosis of the aortic arch. There is no aneurysm, dissection or hemodynamically significant stenosis of the visualized portion of the aorta. Normal variant aortic arch branching pattern with the brachiocephalic and left common carotid arteries sharing a common origin. The visualized proximal subclavian arteries are widely patent. RIGHT CAROTID SYSTEM: Normal without aneurysm, dissection or stenosis. LEFT CAROTID SYSTEM: Normal without aneurysm, dissection or stenosis. VERTEBRAL ARTERIES: Left dominant configuration. Both origins are clearly patent. There is no dissection, occlusion or flow-limiting stenosis to the skull base (V1-V3 segments). CTA HEAD FINDINGS POSTERIOR CIRCULATION: --Vertebral arteries: Normal V4 segments. --Inferior cerebellar arteries: Normal. --Basilar artery: Normal. --Superior cerebellar arteries: Normal. --Posterior cerebral arteries (PCA): Normal. ANTERIOR CIRCULATION: --Intracranial internal carotid  arteries: Normal. --Anterior cerebral arteries (ACA): Normal. Both A1 segments are present. Patent anterior communicating artery (a-comm). --Middle cerebral arteries (MCA): Normal. VENOUS SINUSES: As permitted by contrast timing, patent. ANATOMIC VARIANTS: None Review of the MIP images confirms the above findings. IMPRESSION: 1. No emergent large vessel occlusion or high-grade stenosis of the intracranial arteries. 2. Innumerable nodular pulmonary metastases. Electronically Signed   By: Ulyses Jarred M.D.   On: 01/09/2020 05:17   MR ANGIO HEAD WO CONTRAST  Result Date: 01/09/2020 CLINICAL DATA:  Acute neurologic deficit.  Slurred speech and facial numbness EXAM: MRI HEAD WITHOUT CONTRAST MRA HEAD WITHOUT CONTRAST TECHNIQUE: Multiplanar, multiecho pulse sequences of the brain and surrounding structures were obtained without intravenous contrast. Angiographic images of the head were obtained using MRA technique without contrast. COMPARISON:  None. FINDINGS: MRI HEAD FINDINGS Brain: There is a small amount of hemorrhage, likely subacute, within the left occipital lobe. It is unclear whether this is subarachnoid or intraparenchymal within the cortex. There are areas of hyperintense T2-weighted signal within the left temporal lobe. Normal volume of CSF spaces. No chronic microhemorrhage. Normal midline structures. Vascular: Normal flow voids. Skull and upper cervical spine: Normal marrow signal. Sinuses/Orbits: Negative. Other: None. MRA HEAD FINDINGS POSTERIOR CIRCULATION: --Vertebral arteries: Normal V4 segments. --Inferior cerebellar arteries: Normal. --Basilar artery: Normal. --Superior cerebellar arteries: Normal. --Posterior cerebral arteries: Multifocal stenosis of the distal left PCA. Normal right PCA. ANTERIOR CIRCULATION: --Intracranial internal carotid arteries: Normal. --Anterior cerebral arteries (ACA): Normal. Both A1 segments are present. Patent anterior communicating artery (a-comm). --Middle cerebral arteries (MCA): Normal. IMPRESSION: 1. Small amount of hemorrhage, likely subacute, within the left occipital lobe. Given the multifocal left PCA stenosis, this probably represents hemorrhage associated with a recent infarct. 2. Areas of hyperintense T2-weighted signal within the left temporal lobe may also be due to prior left PCA ischemic events. 3. Multifocal stenosis of the distal left PCA. Otherwise normal intracranial MRA. Electronically Signed   By: Ulyses Jarred M.D.   On: 01/09/2020 02:00   MR BRAIN WO CONTRAST  Result Date: 01/09/2020 CLINICAL DATA:  Acute neurologic deficit. Slurred speech and facial numbness  EXAM: MRI HEAD WITHOUT CONTRAST MRA HEAD WITHOUT CONTRAST TECHNIQUE: Multiplanar, multiecho pulse sequences of the brain and surrounding structures were obtained without intravenous contrast. Angiographic images of the head were obtained using MRA technique without contrast. COMPARISON:  None. FINDINGS: MRI HEAD FINDINGS Brain: There is a small amount of hemorrhage, likely subacute, within the left occipital lobe. It is unclear whether this is subarachnoid or intraparenchymal within the cortex. There are areas of hyperintense T2-weighted signal within the left temporal lobe. Normal volume of CSF spaces. No chronic microhemorrhage. Normal midline structures. Vascular: Normal flow voids. Skull and upper cervical spine: Normal marrow signal. Sinuses/Orbits: Negative. Other: None. MRA HEAD FINDINGS POSTERIOR CIRCULATION: --Vertebral arteries: Normal V4 segments. --Inferior cerebellar arteries: Normal. --Basilar artery: Normal. --Superior cerebellar arteries: Normal. --Posterior cerebral arteries: Multifocal stenosis of the distal left PCA. Normal right PCA. ANTERIOR CIRCULATION: --Intracranial internal carotid arteries: Normal. --Anterior cerebral arteries (ACA): Normal. Both A1 segments are present. Patent anterior communicating artery (a-comm). --Middle cerebral arteries (MCA): Normal. IMPRESSION: 1. Small amount of hemorrhage, likely subacute, within the left occipital lobe. Given the multifocal left PCA stenosis, this probably represents hemorrhage associated with a recent infarct. 2. Areas of hyperintense T2-weighted signal within the left temporal lobe may also be due to prior left PCA ischemic events. 3. Multifocal stenosis of the distal left PCA. Otherwise normal intracranial MRA. Electronically Signed  By: Ulyses Jarred M.D.   On: 01/09/2020 02:00   CT CHEST ABDOMEN PELVIS W CONTRAST  Result Date: 01/10/2020 CLINICAL DATA:  Further evaluation of lung nodules noted at the lung apices on the CTA neck from  yesterday. Unintentional weight loss. EXAM: CT CHEST, ABDOMEN, AND PELVIS WITH CONTRAST TECHNIQUE: Multidetector CT imaging of the chest, abdomen and pelvis was performed following the standard protocol during bolus administration of intravenous contrast. CONTRAST:  156mL OMNIPAQUE IOHEXOL 300 MG/ML  SOLN COMPARISON:  CTA neck, 01/09/2020. FINDINGS: CT CHEST FINDINGS Cardiovascular: Heart is normal in size and configuration. No pericardial effusion. No coronary artery calcifications. Great vessels are normal in caliber. Minimal aortic atherosclerosis along the descending portion. Branch vessels are widely patent. Mediastinum/Nodes: Masslike soft tissue along the anterior mediastinum anterior to the heart, 2.0 x 1.1 cm in size, possibly a mildly enlarged lymph node. No other mediastinal masses. No other evidence of an enlarged lymph node. No neck base or axillary masses or adenopathy. Trachea and esophagus are unremarkable. Lungs/Pleura: Left lower lobe mass with ill-defined margins, measuring approximately 7.8 cm from superior to inferior by 4.8 x 4.2 cm transversely. There are numerous bilateral pulmonary nodules consistent with widespread metastatic disease throughout both lungs. No evidence of pneumonia or pulmonary edema. No pleural effusion or pneumothorax. Musculoskeletal: Lucent bone lesions. The best defined arises from the transverse process of T5 on the right. Chest wall: Small nodule/mass in the right breast measuring 7 mm. CT ABDOMEN PELVIS FINDINGS Hepatobiliary: 3 cm mass in the peripheral aspect of the right liver lobe demonstrating peripheral nodular enhancement consistent with a hemangioma. No other liver masses or lesions. Normal gallbladder. No bile duct dilation. Pancreas: Unremarkable. No pancreatic ductal dilatation or surrounding inflammatory changes. Spleen: Normal in size without focal abnormality. Adrenals/Urinary Tract: No adrenal masses. Kidneys are normal in size, orientation and  position with symmetric enhancement and excretion. 4-5 mm low-attenuation oval mass in the midpole the right kidney, too small to fully characterize, but consistent with a cyst. 1 cm lower pole cyst from the right kidney. No other renal masses, no stones and no hydronephrosis. Normal ureters. Normal bladder. Stomach/Bowel: Normal stomach. Small bowel and colon are normal in caliber. No masses. No wall thickening or inflammation. Normal appendix visualized. Vascular/Lymphatic: No significant vascular findings are present. No enlarged abdominal or pelvic lymph nodes. Reproductive: Status post hysterectomy. No adnexal masses. Other: No abdominal wall hernia.  No ascites. Musculoskeletal: There are lucent/lytic bone lesions. Lesions are noted in the anterior left acetabulum, at the base of the pubis, right ilium, upper sacrum and subtly along the lumbar spine. There is also an area of mixed sclerosis along the anterior right acetabulum. IMPRESSION: 1. Dominant mass in the left lower lobe consistent with a primary bronchogenic carcinoma. This measures approximately 7.8 x 4.2 x 4.8 cm. 2. Extensive metastatic disease to both lungs with numerous lung nodules throughout all lobes. 2.0 x 1.1 cm soft tissue mass versus a mildly enlarged lymph node in the anterior mediastinum anterior to the heart, possibly metastatic disease. 3. Skeletal metastatic disease with lucent bone lesions, the most definitively seen within the right T5 transverse process, the sacrum, right ilium and left anterior acetabulum/pubis. 4. No other evidence of metastatic disease. 5. No acute findings. 6. 3 cm right liver lobe hemangioma. Electronically Signed   By: Lajean Manes M.D.   On: 01/10/2020 10:10   ECHOCARDIOGRAM COMPLETE  Result Date: 01/09/2020    ECHOCARDIOGRAM REPORT   Patient Name:   Stefanie  Camacho Date of Exam: 01/09/2020 Medical Rec #:  [id number]     Height:       66.0 in Accession #:    [id number]    Weight:       193.6 lb Date of Birth:   04-16-1946     BSA:          1.972 m Patient Age:    65 years      BP:           137/66 mmHg Patient Gender: F             HR:           59 bpm. Exam Location:  Inpatient Procedure: 2D Echo, Cardiac Doppler and Color Doppler Indications:    Stroke  History:        Patient has prior history of Echocardiogram examinations, most                 recent 07/07/2019. Arrythmias:LBBB; Risk Factors:Hypertension,                 Dyslipidemia and Sleep Apnea. Non-ischemic cardiomyopathy.  Sonographer:    Clayton Lefort RDCS (AE) Referring Phys: [place]  1. Left ventricular ejection fraction, by estimation, is 55 to 60%. The left ventricle has normal function. The left ventricle has no regional wall motion abnormalities. There is mild left ventricular hypertrophy. Left ventricular diastolic parameters are consistent with Grade I diastolic dysfunction (impaired relaxation).  2. Right ventricular systolic function is normal. The right ventricular size is normal.  3. The mitral valve is grossly normal. Trivial mitral valve regurgitation.  4. The aortic valve is grossly normal. Aortic valve regurgitation is not visualized. No aortic stenosis is present. FINDINGS  Left Ventricle: Left ventricular ejection fraction, by estimation, is 55 to 60%. The left ventricle has normal function. The left ventricle has no regional wall motion abnormalities. The left ventricular internal cavity size was normal in size. There is  mild left ventricular hypertrophy. Left ventricular diastolic parameters are consistent with Grade I diastolic dysfunction (impaired relaxation). Right Ventricle: The right ventricular size is normal. No increase in right ventricular wall thickness. Right ventricular systolic function is normal. Left Atrium: Left atrial size was normal in size. Right Atrium: Right atrial size was normal in size. Pericardium: There is no evidence of pericardial effusion. Mitral Valve: The mitral valve is grossly  normal. Trivial mitral valve regurgitation. MV peak gradient, 2.9 mmHg. The mean mitral valve gradient is 1.0 mmHg. Tricuspid Valve: The tricuspid valve is grossly normal. Tricuspid valve regurgitation is not demonstrated. Aortic Valve: The aortic valve is grossly normal. Aortic valve regurgitation is not visualized. No aortic stenosis is present. Aortic valve mean gradient measures 3.0 mmHg. Aortic valve peak gradient measures 6.2 mmHg. Aortic valve area, by VTI measures 2.10 cm. Pulmonic Valve: The pulmonic valve was grossly normal. Pulmonic valve regurgitation is trivial. Aorta: The aortic root and ascending aorta are structurally normal, with no evidence of dilitation. IAS/Shunts: The atrial septum is grossly normal.  LEFT VENTRICLE PLAX 2D LVIDd:         4.10 cm  Diastology LVIDs:         3.00 cm  LV e' lateral:   8.05 cm/s LV PW:         1.20 cm  LV E/e' lateral: 6.2 LV IVS:        1.20 cm  LV e' medial:    6.31 cm/s LVOT diam:  2.00 cm  LV E/e' medial:  7.9 LV SV:         50 LV SV Index:   25 LVOT Area:     3.14 cm  RIGHT VENTRICLE             IVC RV Basal diam:  3.00 cm     IVC diam: 0.90 cm RV S prime:     14.10 cm/s TAPSE (M-mode): 2.7 cm LEFT ATRIUM             Index       RIGHT ATRIUM           Index LA diam:        2.60 cm 1.32 cm/m  RA Area:     11.40 cm LA Vol (A2C):   35.9 ml 18.20 ml/m RA Volume:   21.90 ml  11.10 ml/m LA Vol (A4C):   32.8 ml 16.63 ml/m LA Biplane Vol: 36.6 ml 18.56 ml/m  AORTIC VALVE AV Area (Vmax):    1.98 cm AV Area (Vmean):   2.04 cm AV Area (VTI):     2.10 cm AV Vmax:           125.00 cm/s AV Vmean:          86.100 cm/s AV VTI:            0.238 m AV Peak Grad:      6.2 mmHg AV Mean Grad:      3.0 mmHg LVOT Vmax:         78.90 cm/s LVOT Vmean:        56.000 cm/s LVOT VTI:          0.159 m LVOT/AV VTI ratio: 0.67  AORTA Ao Root diam: 3.20 cm Ao Asc diam:  3.30 cm MITRAL VALVE MV Area (PHT): 2.45 cm    SHUNTS MV Peak grad:  2.9 mmHg    Systemic VTI:  0.16 m MV Mean  grad:  1.0 mmHg    Systemic Diam: 2.00 cm MV Vmax:       0.85 m/s MV Vmean:      48.3 cm/s MV Decel Time: 310 msec MV E velocity: 50.10 cm/s MV A velocity: 74.10 cm/s MV E/A ratio:  0.68 Mertie Moores MD Electronically signed by Mertie Moores MD Signature Date/Time: 01/09/2020/1:34:44 PM    Final    VAS [place] LOWER EXTREMITY VENOUS (DVT)  Result Date: 01/10/2020  Lower Venous DVTStudy Indications: Embolic stroke.  Comparison Study: No prior studies. Performing Technologist: Darlin Coco  Examination Guidelines: A complete evaluation includes B-mode imaging, spectral Doppler, color Doppler, and power Doppler as needed of all accessible portions of each vessel. Bilateral testing is considered an integral part of a complete examination. Limited examinations for reoccurring indications may be performed as noted. The reflux portion of the exam is performed with the patient in reverse Trendelenburg.  +---------+---------------+---------+-----------+----------+--------------+ RIGHT    CompressibilityPhasicitySpontaneityPropertiesThrombus Aging +---------+---------------+---------+-----------+----------+--------------+ CFV      Full           Yes      Yes                                 +---------+---------------+---------+-----------+----------+--------------+ SFJ      Full                                                        +---------+---------------+---------+-----------+----------+--------------+  FV Prox  Full                                                        +---------+---------------+---------+-----------+----------+--------------+ FV Mid   Full                                                        +---------+---------------+---------+-----------+----------+--------------+ FV DistalFull                                                        +---------+---------------+---------+-----------+----------+--------------+ PFV      Full                                                         +---------+---------------+---------+-----------+----------+--------------+ POP      Full           Yes      Yes                                 +---------+---------------+---------+-----------+----------+--------------+ PTV      Full                                                        +---------+---------------+---------+-----------+----------+--------------+ PERO     Full                                                        +---------+---------------+---------+-----------+----------+--------------+   +---------+---------------+---------+-----------+----------+--------------+ LEFT     CompressibilityPhasicitySpontaneityPropertiesThrombus Aging +---------+---------------+---------+-----------+----------+--------------+ CFV      Full           Yes      Yes                                 +---------+---------------+---------+-----------+----------+--------------+ SFJ      Full                                                        +---------+---------------+---------+-----------+----------+--------------+ FV Prox  Full                                                        +---------+---------------+---------+-----------+----------+--------------+  FV Mid   Full                                                        +---------+---------------+---------+-----------+----------+--------------+ FV DistalFull                                                        +---------+---------------+---------+-----------+----------+--------------+ PFV      Full                                                        +---------+---------------+---------+-----------+----------+--------------+ POP      Full           Yes      Yes                                 +---------+---------------+---------+-----------+----------+--------------+ PTV      Full                                                         +---------+---------------+---------+-----------+----------+--------------+ PERO     Full                                                        +---------+---------------+---------+-----------+----------+--------------+     Summary: RIGHT: - There is no evidence of deep vein thrombosis in the lower extremity.  - No cystic structure found in the popliteal fossa.  LEFT: - There is no evidence of deep vein thrombosis in the lower extremity.  - No cystic structure found in the popliteal fossa.  *See table(s) above for measurements and observations. Electronically signed by Ruta Hinds MD on 01/10/2020 at 10:04:29 AM.    Final        Oswald Hillock   [facility] If 7PM-7AM, please contact night-coverage at [email or web address], Office  [phone]   01/10/2020, 3:01 PM  LOS: 1 day

## 2020-01-11 ENCOUNTER — Inpatient Hospital Stay (HOSPITAL_COMMUNITY): Payer: Medicare HMO

## 2020-01-11 DIAGNOSIS — C3490 Malignant neoplasm of unspecified part of unspecified bronchus or lung: Secondary | ICD-10-CM

## 2020-01-11 DIAGNOSIS — R918 Other nonspecific abnormal finding of lung field: Secondary | ICD-10-CM

## 2020-01-11 DIAGNOSIS — C7931 Secondary malignant neoplasm of brain: Principal | ICD-10-CM

## 2020-01-11 LAB — COMPREHENSIVE METABOLIC PANEL
ALT: 16 U/L (ref 0–44)
AST: 16 U/L (ref 15–41)
Albumin: 3.4 g/dL — ABNORMAL LOW (ref 3.5–5.0)
Alkaline Phosphatase: 221 U/L — ABNORMAL HIGH (ref 38–126)
Anion gap: 7 (ref 5–15)
BUN: 23 mg/dL (ref 8–23)
CO2: 28 mmol/L (ref 22–32)
Calcium: 9.8 mg/dL (ref 8.9–10.3)
Chloride: 101 mmol/L (ref 98–111)
Creatinine, Ser: 1.12 mg/dL — ABNORMAL HIGH (ref 0.44–1.00)
GFR calc Af Amer: 57 mL/min — ABNORMAL LOW (ref >60)
GFR calc non Af Amer: 49 mL/min — ABNORMAL LOW (ref >60)
Glucose, Bld: 112 mg/dL — ABNORMAL HIGH (ref 70–99)
Potassium: 4.9 mmol/L (ref 3.5–5.1)
Sodium: 136 mmol/L (ref 135–145)
Total Bilirubin: 0.6 mg/dL (ref 0.3–1.2)
Total Protein: 6.7 g/dL (ref 6.5–8.1)

## 2020-01-11 LAB — CBC
HCT: 41.8 % (ref 36.0–46.0)
Hemoglobin: 13.2 g/dL (ref 12.0–15.0)
MCH: 28 pg (ref 26.0–34.0)
MCHC: 31.6 g/dL (ref 30.0–36.0)
MCV: 88.6 fL (ref 80.0–100.0)
Platelets: 251 10*3/uL (ref 150–400)
RBC: 4.72 MIL/uL (ref 3.87–5.11)
RDW: 13.1 % (ref 11.5–15.5)
WBC: 8.4 10*3/uL (ref 4.0–10.5)
nRBC: 0 % (ref 0.0–0.2)

## 2020-01-11 LAB — PROTIME-INR
INR: 1 (ref 0.8–1.2)
Prothrombin Time: 13.1 seconds (ref 11.4–15.2)

## 2020-01-11 LAB — APTT: aPTT: 31 seconds (ref 24–36)

## 2020-01-11 MED ORDER — LEVETIRACETAM IN NACL 1000 MG/100ML IV SOLN
1000.0000 mg | Freq: Once | INTRAVENOUS | Status: AC
  Administered 2020-01-11: 1000 mg via INTRAVENOUS
  Filled 2020-01-11 (×2): qty 100

## 2020-01-11 MED ORDER — DEXAMETHASONE 4 MG PO TABS
4.0000 mg | ORAL_TABLET | Freq: Two times a day (BID) | ORAL | Status: DC
  Administered 2020-01-11 – 2020-01-12 (×3): 4 mg via ORAL
  Filled 2020-01-11 (×3): qty 1

## 2020-01-11 MED ORDER — LEVETIRACETAM 500 MG PO TABS
500.0000 mg | ORAL_TABLET | Freq: Two times a day (BID) | ORAL | Status: DC
  Administered 2020-01-11 – 2020-01-13 (×4): 500 mg via ORAL
  Filled 2020-01-11 (×4): qty 1

## 2020-01-11 NOTE — Progress Notes (Signed)
[facility]  PROGRESS NOTE  Stefanie Camacho VFI:[id number] DOB: May 02, 1946 DOA: 01/08/2020 PCP: [name], MD   Brief HPI:    74 year old female with a history of nonischemic cardiomyopathy, hypothyroidism, hypertension who experienced slurred speech about a week ago when she was talking to her friend on phone.  This lasted for about 15 minutes and resolved.  About 4 days ago patient had right facial numbness which lasted for few seconds and resolved.  She was advised to come to the ED.  She did not have weakness of upper or lower extremities difficulty swallowing or any visual symptoms.  In the ED CT head was unremarkable MRI of the brain and MRI of the brain showed hemorrhagic conversion of stroke.  Neurology was consulted. CTA neck showed multiple pulmonary nodules, CT chest/abdomen/pelvis was obtained which showed large left lung mass with pulmonary metastasis, consistent with bronchogenic carcinoma. There is also metastasis to spine and ileum, left acetabulum.   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   Assessment/Plan:     1. Acute CVA with hemorrhagic conversion-continue aspirin 81 mg daily, Lipitor 10 mg daily. Patient has multifocal stenosis of distal left PCA seen on MRA.  Neurology is following. Lower extremity venous Dopplers negative for DVT. 2. Metastatic lung cancer-CT chest/abdomen/pelvis showed large left lung mass with mets to lungs, spine.  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  Pulmonology was consulted, plan for bronchoscopy in a.m. for tissue diagnosis.  Patient also wants palliative care consult.  We will consult palliative care for further recommendations.   3. ?  Seizure-patient was having numbness of right lip, discussed with neurology, IV Keppra  was recommended.  1 g IV Keppra was given in the morning.  Will start Keppra 500 p.o. twice daily.  Also start Decadron 4 mg IV every 12 hours for minimal cerebral edema. 4. Hypertension-blood pressures controlled, continue metoprolol, losartan, spironolactone 5. Hyperlipidemia-continue Lipitor 10 mg daily 6. Hypothyroidism-continue Synthroid      COVID-19 Labs  No results for input(s): DDIMER, FERRITIN, LDH, CRP in the last 72 hours.  Lab Results  Component Value Date   [place] NEGATIVE 01/08/2020     Scheduled medications:   . aspirin EC  81 mg Oral Daily  . atorvastatin  10 mg Oral q1800  . FLUoxetine  10 mg Oral Daily  . levETIRAcetam  500 mg Oral BID  . levothyroxine  75 mcg Oral Daily  . losartan  50 mg Oral Daily  . metoprolol succinate  50 mg Oral Daily  . pantoprazole  40 mg Oral Daily  . spironolactone  12.5 mg Oral Daily         CBG: Recent Labs  Lab 01/08/20 1347  GLUCAP 96    SpO2: 95 %    CBC: Recent Labs  Lab 01/08/20 1316  WBC 8.1  NEUTROABS 4.8  HGB 14.6  HCT 45.3  MCV 87.8  PLT 416    Basic Metabolic Panel: Recent Labs  Lab 01/08/20 1316 01/10/20 0343  NA 135 137  K 4.7 4.3  CL 101 102  CO2 26 26  GLUCOSE 103* 104*  BUN 23 24*  CREATININE 0.89 1.15*  CALCIUM 10.2 10.0     Liver Function Tests: Recent Labs  Lab 01/08/20 1316 01/10/20 0343  AST 15 18  ALT 18 16  ALKPHOS 243* 223*  BILITOT 0.7  1.0  PROT 7.8 6.5  ALBUMIN 3.9 3.3*     Antibiotics: Anti-infectives (From admission, onward)   None       DVT prophylaxis: SCDs  Code Status: Full code  Family Communication: No family at bedside    Status is: Inpatient  Dispo: The patient is from: Home              Anticipated d/c is to: Home              Anticipated d/c date is: 01/12/2020              Patient currently not stable for discharge  Barrier to discharge-ongoing work-up for left lung mass      Consultants:  Neurology  Procedures:     Objective   Vitals:   01/10/20 0752 01/10/20 1635 01/10/20 2043 01/11/20 0750  BP: 135/71 109/75 123/71 122/79  Pulse: 60 67 68 66  Resp: 18 18 20    Temp: 97.7 F (36.5 C) 98.2 F (36.8 C) 98.3 F (36.8 C) 97.6 F (36.4 C)  TempSrc:   Oral   SpO2: 99% 94% 93% 95%  Weight:      Height:       No intake or output data in the 24 hours ending 01/11/20 1509  09/04 1901 - 09/06 0700 In: 240 [P.O.:240] Out: -   Filed Weights   01/08/20 1312 01/09/20 0041  Weight: 89.8 kg 87.8 kg    Physical Examination:   General-appears in no acute distress  Heart-S1-S2, regular, no murmur auscultated  Lungs-clear to auscultation bilaterally, no wheezing or crackles auscultated  Abdomen-soft, nontender, no organomegaly  Extremities-no edema in the lower extremities  Neuro-alert, oriented x3, no focal deficit noted    Data Reviewed:   Recent Results (from the past 240 hour(s))  SARS Coronavirus 2 by RT PCR (hospital order, performed in [place] hospital lab) Nasopharyngeal Nasopharyngeal Swab     Status: None   Collection Time: 01/08/20  4:28 PM   Specimen: Nasopharyngeal Swab  Result Value Ref Range Status   SARS Coronavirus 2 NEGATIVE NEGATIVE Final    Comment: (NOTE) SARS-CoV-2 target nucleic acids are NOT DETECTED.  The SARS-CoV-2 RNA is generally detectable in upper and lower respiratory specimens during the acute phase of infection. The lowest concentration of SARS-CoV-2 viral copies this assay can detect is 250 copies / mL. A negative result does not preclude SARS-CoV-2 infection and should not be used as the sole basis for treatment or other patient management decisions.  A negative result may occur with improper specimen collection / handling, submission of specimen other than nasopharyngeal swab, presence of viral mutation(s) within the areas targeted by this assay, and inadequate number of viral copies (<250  copies / mL). A negative result must be combined with clinical observations, patient history, and epidemiological information.  Fact Sheet for Patients:   StrictlyIdeas.no  Fact Sheet for Healthcare Providers: BankingDealers.co.za  This test is not yet approved or  cleared by the [place] FDA and has been authorized for detection and/or diagnosis of SARS-CoV-2 by FDA under an Emergency Use Authorization (EUA).  This EUA will remain in effect (meaning this test can be used) for the duration of the COVID-19 declaration under Section 564(b)(1) of the Act, 21 U.S.C. section 360bbb-3(b)(1), unless the authorization is terminated or revoked sooner.  Performed at [facility], [address] [place] , [place] [address]     No results for input(s): LIPASE, AMYLASE in the  last 168 hours. No results for input(s): AMMONIA in the last 168 hours.  Cardiac Enzymes: No results for input(s): CKTOTAL, CKMB, CKMBINDEX, TROPONINI in the last 168 hours. BNP (last 3 results) No results for input(s): BNP in the last 8760 hours.  ProBNP (last 3 results) No results for input(s): PROBNP in the last 8760 hours.  Studies:  MR BRAIN W CONTRAST  Result Date: 01/10/2020 CLINICAL DATA:  Non-small cell lung cancer staging. EXAM: MRI HEAD WITH CONTRAST TECHNIQUE: Multiplanar, multiecho pulse sequences of the brain and surrounding structures were obtained with intravenous contrast. CONTRAST:  68mL GADAVIST GADOBUTROL 1 MMOL/ML IV SOLN COMPARISON:  Noncontrast head MRI 01/09/2020 FINDINGS: There are numerous enhancing lesions in both cerebral hemispheres with the largest measuring 1.4 cm in the left temporal lobe (series 5, image 19). There is minimal edema associated with this lesion as well as with a slightly smaller lesion more posteriorly at the left temporo-occipital junction. There are also multiple areas of abnormal leptomeningeal enhancement  bilaterally, greatest in the left occipital region. No enhancing lesions are identified in the posterior fossa. An enhancing right frontoparietal skull lesion measures 1.6 cm (series 5, image 45). IMPRESSION: 1. 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 2. 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 Electronically Signed   By: Logan Bores M.D.   On: 01/10/2020 16:35   CT CHEST ABDOMEN PELVIS W CONTRAST  Result Date: 01/10/2020 CLINICAL DATA:  Further evaluation of lung nodules noted at the lung apices on the CTA neck from yesterday. Unintentional weight loss. EXAM: CT CHEST, ABDOMEN, AND PELVIS WITH CONTRAST TECHNIQUE: Multidetector CT imaging of the chest, abdomen and pelvis was performed following the standard protocol during bolus administration of intravenous contrast. CONTRAST:  174mL OMNIPAQUE IOHEXOL 300 MG/ML  SOLN COMPARISON:  CTA neck, 01/09/2020. FINDINGS: CT CHEST FINDINGS Cardiovascular: Heart is normal in size and configuration. No pericardial effusion. No coronary artery calcifications. Great vessels are normal in caliber. Minimal aortic atherosclerosis along the descending portion. Branch vessels are widely patent. Mediastinum/Nodes: Masslike soft tissue along the anterior mediastinum anterior to the heart, 2.0 x 1.1 cm in size, possibly a mildly enlarged lymph node. No other mediastinal masses. No other evidence of an enlarged lymph node. No neck base or axillary masses or adenopathy. Trachea and esophagus are unremarkable. Lungs/Pleura: Left lower lobe mass with ill-defined margins, measuring approximately 7.8 cm from superior to inferior by 4.8 x 4.2 cm transversely. There are numerous bilateral pulmonary nodules consistent with widespread metastatic disease throughout both lungs. No evidence of pneumonia or pulmonary edema. No pleural effusion or pneumothorax. Musculoskeletal: Lucent bone lesions. The best defined arises from the  transverse process of T5 on the right. Chest wall: Small nodule/mass in the right breast measuring 7 mm. CT ABDOMEN PELVIS FINDINGS Hepatobiliary: 3 cm mass in the peripheral aspect of the right liver lobe demonstrating peripheral nodular enhancement consistent with a hemangioma. No other liver masses or lesions. Normal gallbladder. No bile duct dilation. Pancreas: Unremarkable. No pancreatic ductal dilatation or surrounding inflammatory changes. Spleen: Normal in size without focal abnormality. Adrenals/Urinary Tract: No adrenal masses. Kidneys are normal in size, orientation and position with symmetric enhancement and excretion. 4-5 mm low-attenuation oval mass in the midpole the right kidney, too small to fully characterize, but consistent with a cyst. 1 cm lower pole cyst from the right kidney. No other renal masses, no stones and no hydronephrosis. Normal ureters. Normal bladder. Stomach/Bowel: Normal stomach. Small bowel and colon are normal in  caliber. No masses. No wall thickening or inflammation. Normal appendix visualized. Vascular/Lymphatic: No significant vascular findings are present. No enlarged abdominal or pelvic lymph nodes. Reproductive: Status post hysterectomy. No adnexal masses. Other: No abdominal wall hernia.  No ascites. Musculoskeletal: There are lucent/lytic bone lesions. Lesions are noted in the anterior left acetabulum, at the base of the pubis, right ilium, upper sacrum and subtly along the lumbar spine. There is also an area of mixed sclerosis along the anterior right acetabulum. IMPRESSION: 1. Dominant mass in the left lower lobe consistent with a primary bronchogenic carcinoma. This measures approximately 7.8 x 4.2 x 4.8 cm. 2. Extensive metastatic disease to both lungs with numerous lung nodules throughout all lobes. 2.0 x 1.1 cm soft tissue mass versus a mildly enlarged lymph node in the anterior mediastinum anterior to the heart, possibly metastatic disease. 3. Skeletal metastatic  disease with lucent bone lesions, the most definitively seen within the right T5 transverse process, the sacrum, right ilium and left anterior acetabulum/pubis. 4. No other evidence of metastatic disease. 5. No acute findings. 6. 3 cm right liver lobe hemangioma. Electronically Signed   By: Lajean Manes M.D.   On: 01/10/2020 10:10       [place]   [facility] If 7PM-7AM, please contact night-coverage at [email or web address], Office  [phone]   01/11/2020, 3:09 PM  LOS: 2 days

## 2020-01-11 NOTE — Progress Notes (Signed)
Patient assessed.  Reviewed LPN documentation, and agree with the findings.

## 2020-01-11 NOTE — Consult Note (Signed)
NAME:  Stefanie Camacho, MRN:  [id number], DOB:  1945-08-02, LOS: 2 ADMISSION DATE:  01/08/2020, CONSULTATION DATE:  01/11/20 REFERRING MD:  Darrick Meigs, CHIEF COMPLAINT:  Facial numbness   Brief History   74 year old woman here for stroke workup found to have evidence of widespread cancer and question of lung origin.  History of present illness   74 year old nonsmoker with history noted below who presented with transient neurological complaints on 9/4 (self limited slurred speech, self-limited R facial numbbness, difficulty with balance).  Underwent usual workup for TIA and found to have widespread cancer to brain, skeleton, lung.  Pulmonology consulted for consideration of diagnostic bronchoscopy.   Patient has had a prior hysterectomy, no prior cancers, and mammograms up to date.  Denies hemoptysis, has mild cough for past month.  ~5 lb weight loss over past 3 months.  Past Medical History  OSA NICM LBBB HLD Anxiety  Significant Hospital Events   9/4 admitted  Consults:  Neurology, Oncology  Procedures:  N/A  Significant Diagnostic Tests:   MRI Brain IMPRESSION: 1. 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 2. 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  CT C/A/P IMPRESSION: 1. Dominant mass in the left lower lobe consistent with a primary bronchogenic carcinoma. This measures approximately 7.8 x 4.2 x 4.8 cm. 2. Extensive metastatic disease to both lungs with numerous lung nodules throughout all lobes. 2.0 x 1.1 cm soft tissue mass versus a mildly enlarged lymph node in the anterior mediastinum anterior to the heart, possibly metastatic disease. 3. Skeletal metastatic disease with lucent bone lesions, the most definitively seen within the right T5 transverse process, the sacrum, right ilium and left anterior acetabulum/pubis. 4. No other evidence of metastatic disease. 5. No acute findings. 6. 3 cm right liver lobe  hemangioma.  Echo 9/4 1. Left ventricular ejection fraction, by estimation, is 55 to 60%. The  left ventricle has normal function. The left ventricle has no regional  wall motion abnormalities. There is mild left ventricular hypertrophy.  Left ventricular diastolic parameters  are consistent with Grade I diastolic dysfunction (impaired relaxation).  2. Right ventricular systolic function is normal. The right ventricular  size is normal.  3. The mitral valve is grossly normal. Trivial mitral valve  regurgitation.  4. The aortic valve is grossly normal. Aortic valve regurgitation is not  visualized. No aortic stenosis is present.   Micro Data:  COVID neg  Antimicrobials:  N/A   Interim history/subjective:  Consulted.  Objective   Blood pressure 122/79, pulse 66, temperature 97.6 F (36.4 C), resp. rate 20, height '5\' 6"'  (1.676 m), weight 87.8 kg, SpO2 95 %.       No intake or output data in the 24 hours ending 01/11/20 1045 Filed Weights   01/08/20 1312 01/09/20 0041  Weight: 89.8 kg 87.8 kg    Examination: General: pleasant elderly woman lying in bed HENT: MMM, malampatti 4 Lungs: Clear, no wheezing Cardiovascular: RRR, ext warm Abdomen: Soft, +BS Extremities: no edema Neuro: Moves all 4 ext to command, no obvious CN deficits Skin: age related changes and sun damage  Resolved Hospital Problem list   N/A  Assessment & Plan:  Metastatic Cancer- to brain, skeleton, possibly mediastinum.  dominant mass in lung argues for lung primary although distribution is odd.   Nonsmoker so could be eGFR +.  Needs tissue for diagnosis. - NPO midnight for bronchoscopy tomorrow with Dr. Valeta Harms - Steroids for intracranial mets   Labs  CBC: Recent Labs  Lab 01/08/20 1316  WBC 8.1  NEUTROABS 4.8  HGB 14.6  HCT 45.3  MCV 87.8  PLT 161    Basic Metabolic Panel: Recent Labs  Lab 01/08/20 1316 01/10/20 0343  NA 135 137  K 4.7 4.3  CL 101 102  CO2 26 26  GLUCOSE  103* 104*  BUN 23 24*  CREATININE 0.89 1.15*  CALCIUM 10.2 10.0   GFR: Estimated Creatinine Clearance: 49.4 mL/min (A) (by C-G formula based on SCr of 1.15 mg/dL (H)). Recent Labs  Lab 01/08/20 1316  WBC 8.1    Liver Function Tests: Recent Labs  Lab 01/08/20 1316 01/10/20 0343  AST 15 18  ALT 18 16  ALKPHOS 243* 223*  BILITOT 0.7 1.0  PROT 7.8 6.5  ALBUMIN 3.9 3.3*   No results for input(s): LIPASE, AMYLASE in the last 168 hours. No results for input(s): AMMONIA in the last 168 hours.  ABG No results found for: PHART, PCO2ART, PO2ART, HCO3, TCO2, ACIDBASEDEF, O2SAT   Coagulation Profile: Recent Labs  Lab 01/08/20 1316  INR 1.0    Cardiac Enzymes: No results for input(s): CKTOTAL, CKMB, CKMBINDEX, TROPONINI in the last 168 hours.  HbA1C: Hgb A1c MFr Bld  Date/Time Value Ref Range Status  01/09/2020 04:16 AM 6.0 (H) 4.8 - 5.6 % Final    Comment:    (NOTE) Pre diabetes:          5.7%-6.4%  Diabetes:              >6.4%  Glycemic control for   <7.0% adults with diabetes     CBG: Recent Labs  Lab 01/08/20 1347  GLUCAP 96    Review of Systems:    Positive Symptoms in bold:  Constitutional fevers, chills, weight loss, fatigue, anorexia, malaise  Eyes decreased vision, double vision, eye irritation  Ears, Nose, Mouth, Throat sore throat, trouble swallowing, sinus congestion  Cardiovascular chest pain, paroxysmal nocturnal dyspnea, lower ext edema, palpitations   Respiratory SOB, cough, DOE, hemoptysis, wheezing  Gastrointestinal nausea, vomiting, diarrhea  Genitourinary burning with urination, trouble urinating  Musculoskeletal joint aches, joint swelling, back pain  Integumentary  rashes, skin lesions  Neurological focal weakness, focal numbness, trouble speaking, headaches  Psychiatric depression, anxiety, confusion  Endocrine polyuria, polydipsia, cold intolerance, heat intolerance  Hematologic abnormal bruising, abnormal bleeding, unexplained  nose bleeds  Allergic/Immunologic recurrent infections, hives, swollen lymph nodes     Past Medical History  She,  has a past medical history of Anxiety, Dyslipidemia, Essential hypertension, GERD (gastroesophageal reflux disease), Hypothyroidism, Left bundle branch block (06/04/2018), NICM (nonischemic cardiomyopathy) ([place]), Nonischemic cardiomyopathy ([place]) (06/04/2018), and Sleep apnea.   Surgical History    Past Surgical History:  Procedure Laterality Date  . ABDOMINAL HYSTERECTOMY    . BLADDER NECK RECONSTRUCTION    . SHOULDER SURGERY    . TONSILLECTOMY       Social History   reports that she has never smoked. She has never used smokeless tobacco. She reports previous alcohol use. She reports that she does not use drugs.   Family History   Her family history includes Heart disease in her father, maternal grandfather, mother, and paternal grandfather; Stroke in her father and mother.   Allergies No Known Allergies   Home Medications  Prior to Admission medications   Medication Sig Start Date End Date Taking? Authorizing Provider  acetaminophen-codeine (TYLENOL #3) 300-30 MG tablet Take 1-2 tablets by mouth every 8 (eight) hours as needed for moderate pain.  12/31/19  Yes Marybelle Killings, MD  aspirin EC 81 MG tablet Take 81 mg by mouth daily.   Yes [provider]  atorvastatin (LIPITOR) 10 MG tablet Take 1 tablet (10 mg total) by mouth daily at 6 PM. 10/13/19 01/11/20 Yes Park Liter, MD  EUTHYROX 75 MCG tablet Take 75 mcg by mouth every morning. 12/24/19  Yes [provider]  FLUoxetine (PROZAC) 10 MG tablet Take 10 mg by mouth daily.  09/04/19  Yes [provider]  losartan (COZAAR) 50 MG tablet Take 1 tablet (50 mg total) by mouth daily. 10/13/19  Yes Park Liter, MD  metoprolol succinate (TOPROL-XL) 50 MG 24 hr tablet Take 1 tablet (50 mg total) by mouth daily. Take with or immediately following a meal. 10/13/19  Yes Park Liter, MD   omeprazole (PRILOSEC) 20 MG capsule Take 20 mg by mouth daily.   Yes [provider]  spironolactone (ALDACTONE) 25 MG tablet Take 0.5 tablets (12.5 mg total) by mouth daily. 11/17/19  Yes Park Liter, MD  diazepam (VALIUM) 5 MG tablet Take 1 by mouth 1 hour  pre-procedure with very light food. May bring 2nd tablet to appointment. 01/06/20   Magnus Sinning, MD  gabapentin (NEURONTIN) 300 MG capsule Take 1 capsule (300 mg total) by mouth at bedtime. Patient not taking: Reported on 01/09/2020 12/16/19   Mcarthur Rossetti, MD  HYDROcodone-acetaminophen (NORCO/VICODIN) 5-325 MG tablet Take 1 tablet by mouth every 8 (eight) hours as needed. Patient not taking: Reported on 01/09/2020 11/13/19   Couture, Cortni S, PA-C  methylPREDNISolone (MEDROL) 4 MG tablet Take as directed Patient not taking: Reported on 01/09/2020 11/16/19   Pete Pelt, PA-C

## 2020-01-11 NOTE — Progress Notes (Signed)
STROKE TEAM PROGRESS NOTE   INTERVAL HISTORY No family at the bedside. Pt stated that she had another episode this am with right upper and lower lips tingling and numbness, lasted seconds. She also felt more episode of slurry speech or word finding difficulty last shortly for the last 2-3 days. MRI with contrast yesterday showed multifocal brain parenchymal and meningeal metastasis. Pt symptoms concerning for focal seizure, gave keppra load and EEG pending.    OBJECTIVE Vitals:   01/10/20 0752 01/10/20 1635 01/10/20 2043 01/11/20 0750  BP: 135/71 109/75 123/71 122/79  Pulse: 60 67 68 66  Resp: 18 18 20    Temp: 97.7 F (36.5 C) 98.2 F (36.8 C) 98.3 F (36.8 C) 97.6 F (36.4 C)  TempSrc:   Oral   SpO2: 99% 94% 93% 95%  Weight:      Height:       CBC:  Recent Labs  Lab 01/08/20 1316  WBC 8.1  NEUTROABS 4.8  HGB 14.6  HCT 45.3  MCV 87.8  PLT 163   Basic Metabolic Panel:  Recent Labs  Lab 01/08/20 1316 01/10/20 0343  NA 135 137  K 4.7 4.3  CL 101 102  CO2 26 26  GLUCOSE 103* 104*  BUN 23 24*  CREATININE 0.89 1.15*  CALCIUM 10.2 10.0   Lipid Panel:     Component Value Date/Time   CHOL 127 01/09/2020 0416   CHOL 133 07/17/2018 0933   TRIG 112 01/09/2020 0416   HDL 38 (L) 01/09/2020 0416   HDL 40 07/17/2018 0933   CHOLHDL 3.3 01/09/2020 0416   VLDL 22 01/09/2020 0416   LDLCALC 67 01/09/2020 0416   LDLCALC 67 07/17/2018 0933   HgbA1c:  Lab Results  Component Value Date   HGBA1C 6.0 (H) 01/09/2020   Urine Drug Screen: No results found for: LABOPIA, COCAINSCRNUR, LABBENZ, AMPHETMU, THCU, LABBARB  Alcohol Level No results found for: [facility]  IMAGING  CT HEAD WO CONTRAST 01/08/2020 IMPRESSION:  Negative CT head   CT ANGIO HEAD W OR WO CONTRAST CT ANGIO NECK W OR WO CONTRAST 01/09/2020 IMPRESSION:  1. No emergent large vessel occlusion or high-grade stenosis of the intracranial arteries.  2. Innumerable nodular pulmonary metastases.   MR BRAIN WO  CONTRAST MR ANGIO HEAD WO CONTRAST 01/09/2020 IMPRESSION:  1. Small amount of hemorrhage, likely subacute, within the left occipital lobe. Given the multifocal left PCA stenosis, this probably represents hemorrhage associated with a recent infarct.  2. Areas of hyperintense T2-weighted signal within the left temporal lobe may also be due to prior left PCA ischemic events.  3. Multifocal stenosis of the distal left PCA. Otherwise normal intracranial MRA.   CT CHEST ABDOMEN AND PELVIS W CONTRAST 01/10/20 IMPRESSION: 1. Dominant mass in the left lower lobe consistent with a primary bronchogenic carcinoma. This measures approximately 7.8 x 4.2 x 4.8 cm. 2. Extensive metastatic disease to both lungs with numerous lung nodules throughout all lobes. 2.0 x 1.1 cm soft tissue mass versus a mildly enlarged lymph node in the anterior mediastinum anterior to the heart, possibly metastatic disease. 3. Skeletal metastatic disease with lucent bone lesions, the most definitively seen within the right T5 transverse process, the sacrum, right ilium and left anterior acetabulum/pubis. 4. No other evidence of metastatic disease. 5. No acute findings. 6. 3 cm right liver lobe hemangioma.  MRI HEAD WITH CONTRAST 01/10/20 1. 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 2. 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  Transthoracic  Echocardiogram  1. Left ventricular ejection fraction, by estimation, is 55 to 60%. The left ventricle has normal function. The left ventricle has no regional wall motion abnormalities. There is mild left ventricular hypertrophy. Left ventricular diastolic parameters are consistent with Grade I diastolic dysfunction (impaired relaxation).  2. Right ventricular systolic function is normal. The right ventricular size is normal.  3. The mitral valve is grossly normal. Trivial mitral valve regurgitation.  4. The aortic valve is grossly  normal. Aortic valve regurgitation is not visualized. No aortic stenosis is present.   ECG - SR rate 69 BPM. Probable old anterior infarct. (See cardiology reading for complete details)  PHYSICAL EXAM    Temp:  [97.6 F (36.4 C)-98.3 F (36.8 C)] 97.6 F (36.4 C) (09/06 0750) Pulse Rate:  [66-68] 66 (09/06 0750) Resp:  [18-20] 20 (09/05 2043) BP: (109-123)/(71-79) 122/79 (09/06 0750) SpO2:  [93 %-95 %] 95 % (09/06 0750)  General - Well nourished, well developed, in no apparent distress.  Ophthalmologic - fundi not visualized due to noncooperation.  Cardiovascular - Regular rhythm and rate.  Mental Status -  Level of arousal and orientation to time, place, and person were intact. Language including expression, naming, repetition, comprehension was assessed and found intact. Fund of Knowledge was assessed and was intact.  Cranial Nerves II - XII - II - Visual field intact OU. III, IV, VI - Extraocular movements intact. V - Facial sensation intact bilaterally. VII - Facial movement intact bilaterally. VIII - Hearing & vestibular intact bilaterally. X - Palate elevates symmetrically. XI - Chin turning & shoulder shrug intact bilaterally. XII - Tongue protrusion intact.  Motor Strength - The patient's strength was normal in all extremities and pronator drift was absent.  Bulk was normal and fasciculations were absent.   Motor Tone - Muscle tone was assessed at the neck and appendages and was normal.  Reflexes - The patient's reflexes were symmetrical in all extremities and she had no pathological reflexes.  Sensory - Light touch, temperature/pinprick were assessed and were symmetrical.    Coordination - The patient had normal movements in the hands and feet with no ataxia or dysmetria.  Tremor was absent.  Gait and Station - deferred.   ASSESSMENT/PLAN Ms. Darcy Barbara is a 74 y.o. female with history of hypertension, OSA, NICM EF 45%, and hyperlipidemia presenting with  transient right facial numbness. She did not receive IV t-PA due to late presentation (>4.5 hours from time of onset) and resolution of deficits.  Brain and Skull Metastasis  CT head - Negative CT head   MRI head - Small amount of hemorrhage, likely subacute, within the left occipital lobe. Areas of hyperintense T2-weighted signal within the left temporal lobe may also be due to prior left PCA ischemic events.   MRA head - Multifocal stenosis of the distal left PCA.  CTA H&N - No emergent large vessel occlusion or high-grade stenosis of the intracranial arteries.  MRI brain with contrast multiple intracranial parenchymal and leptomeningeal mets, 1.6cm R frontoparietal skull mets  LE venous Doppler negative for DVT  2D Echo EF 55 to 60%  [employer] Virus 2 - negative  LDL - 67  HgbA1c - 6.0  VTE prophylaxis - SCDs  aspirin 81 mg daily prior to admission, now resumed home aspirin 81 mg daily.  On steroids for brain mets per CCM  Therapy recommendations:  Outpt PT   Disposition:  Return home  Metastasic lung cancer  Pt reported intermittent coughing for the  last 3 to 4 months  CTA neck showed innumerable nodular pulmonary metastases.   CT Chest Abd and Pelvis - 9/5 - Dominant mass in the left lower lobe consistent with a primary bronchogenic carcinoma. This measures approximately 7.8 x 4.2 x 4.8 cm. Extensive metastatic disease to both lungs with numerous lung nodules throughout all lobes. 2.0 x 1.1 cm soft tissue mass versus a mildly enlarged lymph node in the anterior mediastinum anterior to the heart, possibly metastatic disease. Skeletal metastatic disease with lucent bone lesions, the most definitively seen within the right T5 transverse process, the sacrum, right ilium and left anterior acetabulum/pubis.  MRI brain with contrast multiple intracranial parenchymal and leptomeningeal mets, 1.6cm R frontoparietal skull mets  Oncology and pulmonology consulted  Bronch  scheduled for 9/7  Palliative consulted  Focal seizure  Episode of slurry speech 10-14 days ago  Episode of right facial numbness prior to presentation  More frequent slurry speech episodes for the last 2-3 days  Again episode of right lip numbness 9/6 am  EEG pending  Loaded with keppra 1g followed by 500mg  bid  Hypertension  Home BP meds: Losartan ; metoprolol ; spironolactone  Current BP meds: Losartan ; metoprolol ; spironolactone  Stable . Long-term BP goal normotensive  Hyperlipidemia  Home Lipid lowering medication: Lipitor 10 mg daily  LDL 67, goal < 100 for non-stroke dx  Current lipid lowering medication: Lipitor 10 mg daily   Continue statin at discharge  Other Stroke Risk Factors  Advanced age  Previous ETOH use  Obesity, Body mass index is 31.24 kg/m., recommend weight loss, diet and exercise as appropriate   Family hx stroke (mother and father)   Other Active Problems  Code status - Full code   AKI - creatinine - 0.89->1.15  Hospital day # 2  Patient condition worsened within the last 24 hours, has developed symptoms consistent with focal seizure, and was given keppra and pending EEG. I discussed with Dr. Darrick Meigs. I spent  35 minutes in total face-to-face time with the patient, more than 50% of which was spent in counseling and coordination of care, reviewing test results, images and medication, and discussing the diagnosis, treatment plan and potential prognosis. This patient's care requiresreview of multiple databases, neurological assessment, discussion with family, other specialists and medical decision making of high complexity.  Rosalin Hawking, MD PhD Stroke Neurology 01/11/2020 12:09 PM   To contact Stroke Continuity provider, please refer to [email or web address] After hours, contact General Neurology

## 2020-01-11 NOTE — Progress Notes (Signed)
EEG complete - results pending 

## 2020-01-11 NOTE — Progress Notes (Signed)
Occupational Therapy Evaluation Patient Details Name: Stefanie Camacho MRN: [id number] DOB: 1946-03-15 Today's Date: 01/11/2020    History of Present Illness 74 year old female with a small hemorrhagic lesion in the left occipital lobe. Presented with transient episode of numbness.  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   Clinical Impression   "6 months ago I was normal and walking my dog a mile a day". Pt recently using a RW for mobility due to increased back pain and leg weakness. Pt states ADL and IADL tasks have become more difficult to complete due to pain and weakness and complains of easily fatiguing with activity. Pt openly talking about her new diagnosis of cancer and states she "feels this will rapidly progress so I need to get my ducks in a row". Feel pt would greatly benefit from palliative medicine consult to discuss goals of care and assess pain control, which appears to be a limiting factor with function and quality of life at this time. Discussed with MD and nsg. Will follow acutely to educate pt on energy conservation, strategies to reduce risk of falls and compensatory strateiges to maximize functional level of independence with ADL and IADL tasks and mobility. Pt very appreciative.     Follow Up Recommendations  Other (comment);Outpatient OT (neuro outpt OT; pending progress)    Equipment Recommendations       Recommendations for Other Services Other (comment) (Palliative Medicine)     Precautions / Restrictions Precautions Precautions: Fall      Mobility Bed Mobility Overal bed mobility: Needs Assistance Bed Mobility: Rolling;Sidelying to Sit Rolling:  Modified independent (Device/Increase time) Sidelying to sit: Modified independent (Device/Increase time)          Transfers Overall transfer level: Needs assistance   Transfers: Sit to/from Stand;Stand Pivot Transfers Sit to Stand: Min guard Stand pivot transfers: Min guard            Balance Overall balance assessment: Needs assistance   Sitting balance-Leahy Scale: Good       Standing balance-Leahy Scale: Fair Standing balance comment: prefers to have BUE support; states her knees sometimes "buckle"                           ADL either performed or assessed with clinical judgement   ADL Overall ADL's : Needs assistance/impaired     Grooming: Min guard;Standing   Upper Body Bathing: Set up;Sitting   Lower Body Bathing: Min guard;Sit to/from stand   Upper Body Dressing : Set up;Sitting   Lower Body Dressing: Set up;Sit to/from stand   Toilet Transfer: Min guard;Ambulation;RW;Comfort height toilet;Grab bars   Toileting- Clothing Manipulation and Hygiene: Supervision/safety       Functional mobility during ADLs: Min guard;Rolling walker;Cueing for safety General ADL Comments: Educated pt on back precautions and use of 'figure four" positioning to reduce pain during mobility and ADL; pt verbalized understanding     Vision Baseline Vision/History: Wears glasses Wears Glasses: Reading only Patient Visual Report: Blurring of vision ("not as crisp") Vision Assessment?: Yes Eye Alignment: Within Functional Limits Ocular Range of Motion: Within Functional Limits Alignment/Gaze Preference: Within Defined Limits Tracking/Visual Pursuits: Able to track stimulus in all quads without difficulty Saccades: Within functional limits  Convergence: Within functional limits Visual Fields: Right visual field deficit (? field cut R)     Perception Perception Perception Tested?:  (WFL)   Praxis Praxis Praxis tested?: Within functional limits    Pertinent  Vitals/Pain Pain Assessment: 0-10 Pain Score: 6  Pain Location: back, buttock hips, L>R Pain Descriptors / Indicators: Aching;Burning;Discomfort;Grimacing;Guarding Pain Intervention(s): Limited activity within patient's tolerance     Hand Dominance Right   Extremity/Trunk Assessment Upper Extremity Assessment Upper Extremity Assessment: Generalized weakness (B shoulder discomfort; attributes it to RW use)   Lower Extremity Assessment Lower Extremity Assessment: Defer to PT evaluation;Generalized weakness   Cervical / Trunk Assessment Cervical / Trunk Assessment: Other exceptions (back pain)   Communication Communication Communication: Expressive difficulties (at itmes has difficulty finding words)   Cognition Arousal/Alertness: Awake/alert Behavior During Therapy: WFL for tasks assessed/performed Overall Cognitive Status: Impaired/Different from baseline Area of Impairment: Memory                     Memory: Decreased short-term memory         General Comments: Pt states she has been having more difficulty with her memory   General Comments       Exercises     Shoulder Instructions      Home Living Family/patient expects to be discharged to:: Private residence Living Arrangements: Alone Available Help at Discharge: Available 24 hours/day Type of Home: House Home Access: Stairs to enter [employer] of Steps: 1/1 Entrance Stairs-Rails: Right;Left Home Layout: One level     Bathroom Shower/Tub: Occupational psychologist: Standard Bathroom Accessibility: Yes (not toilet room in son's house) How Accessible: Accessible via walker Home Equipment: Walker - 2 wheels   Additional Comments: most likely will DC home to live with on eos her kids  Lives With: Alone (her Dog Lucy)    Prior Functioning/Environment Level of Independence: Independent        Comments: only recently started using RW becuase of backpain/leg weakness         OT Problem List: Decreased strength;Decreased activity tolerance;Impaired balance (sitting and/or standing);Decreased safety awareness;Decreased knowledge of use of DME or AE;Cardiopulmonary status limiting activity;Pain;Impaired UE functional use      OT Treatment/Interventions: Self-care/ADL training;Therapeutic exercise;Energy conservation;DME and/or AE instruction;Therapeutic activities;Cognitive remediation/compensation;Patient/family education;Balance training    OT Goals(Current goals can be found in the care plan section) Acute Rehab OT Goals Patient Stated Goal: to "get my ducks in a row" OT Goal Formulation: With patient Time For Goal Achievement: 01/25/20 Potential to Achieve Goals: Good  OT Frequency: Min 2X/week   Barriers to D/C:            Co-evaluation              AM-PAC OT "6 Clicks" Daily Activity     Outcome Measure Help from another person eating meals?: None Help from another person taking care of personal grooming?: A Little Help from another person toileting, which includes using toliet, bedpan, or urinal?: A Little Help from another person bathing (including washing, rinsing, drying)?: A Little Help from another person to put on and taking off regular upper body clothing?: A Little Help from another person to put on and taking off regular lower body clothing?: A Little 6 Click Score: 19   End of Session Equipment Utilized During Treatment: Gait belt;Rolling walker Nurse Communication: Mobility status  Activity Tolerance: Patient tolerated treatment well Patient left: in chair;with call bell/phone within reach  OT Visit Diagnosis: Unsteadiness  on feet (R26.81);Muscle weakness (generalized) (M62.81);History of falling (Z91.81);Other symptoms and signs involving cognitive function;Pain Pain - part of body: Leg (back)                Time: 4239-5320 OT Time Calculation (min): 36 min Charges:  OT General Charges $OT Visit: 1 Visit OT Evaluation $OT  Eval Moderate Complexity: 1 Mod OT Treatments $Self Care/Home Management : 8-22 mins  Maurie Boettcher, OT/L   Acute OT Clinical Specialist [place] Pager [phone] Office [phone]   [facility] 01/11/2020, 1:36 PM

## 2020-01-12 ENCOUNTER — Inpatient Hospital Stay (HOSPITAL_COMMUNITY): Payer: Medicare HMO

## 2020-01-12 ENCOUNTER — Inpatient Hospital Stay (HOSPITAL_COMMUNITY): Payer: Medicare HMO | Admitting: Anesthesiology

## 2020-01-12 ENCOUNTER — Encounter (HOSPITAL_COMMUNITY)
Admission: EM | Disposition: A | Discharge status: Home / Self Care | Payer: Self-pay | Source: Home / Self Care | Attending: Family Medicine

## 2020-01-12 ENCOUNTER — Encounter (HOSPITAL_COMMUNITY): Payer: Self-pay | Admitting: Internal Medicine

## 2020-01-12 DIAGNOSIS — Z515 Encounter for palliative care: Secondary | ICD-10-CM

## 2020-01-12 DIAGNOSIS — C3492 Malignant neoplasm of unspecified part of left bronchus or lung: Secondary | ICD-10-CM

## 2020-01-12 DIAGNOSIS — R569 Unspecified convulsions: Secondary | ICD-10-CM

## 2020-01-12 DIAGNOSIS — Z7189 Other specified counseling: Secondary | ICD-10-CM

## 2020-01-12 DIAGNOSIS — R911 Solitary pulmonary nodule: Secondary | ICD-10-CM

## 2020-01-12 HISTORY — PX: BRONCHIAL BRUSHINGS: SHX5108

## 2020-01-12 HISTORY — PX: VIDEO BRONCHOSCOPY WITH ENDOBRONCHIAL ULTRASOUND: SHX6177

## 2020-01-12 HISTORY — PX: BRONCHIAL NEEDLE ASPIRATION BIOPSY: SHX5106

## 2020-01-12 HISTORY — PX: BRONCHIAL BIOPSY: SHX5109

## 2020-01-12 HISTORY — PX: BRONCHIAL WASHINGS: SHX5105

## 2020-01-12 LAB — GLUCOSE, CAPILLARY: Glucose-Capillary: 146 mg/dL — ABNORMAL HIGH (ref 70–99)

## 2020-01-12 SURGERY — BRONCHOSCOPY, WITH EBUS
Anesthesia: Monitor Anesthesia Care

## 2020-01-12 MED ORDER — ONDANSETRON HCL 4 MG/2ML IJ SOLN
INTRAMUSCULAR | Status: DC | PRN
  Administered 2020-01-12: 4 mg via INTRAVENOUS

## 2020-01-12 MED ORDER — OXYCODONE HCL 5 MG/5ML PO SOLN: 5.0000 mg | Freq: Once | ORAL | Status: DC | PRN

## 2020-01-12 MED ORDER — OXYCODONE HCL 5 MG PO TABS: 5.0000 mg | ORAL_TABLET | Freq: Once | ORAL | Status: DC | PRN

## 2020-01-12 MED ORDER — PHENYLEPHRINE 40 MCG/ML (10ML) SYRINGE FOR IV PUSH (FOR BLOOD PRESSURE SUPPORT)
PREFILLED_SYRINGE | INTRAVENOUS | Status: DC | PRN
  Administered 2020-01-12 (×2): 80 ug via INTRAVENOUS

## 2020-01-12 MED ORDER — FENTANYL CITRATE (PF) 250 MCG/5ML IJ SOLN
INTRAMUSCULAR | Status: DC | PRN
Start: 2020-01-12 — End: 2020-01-12
  Administered 2020-01-12 (×2): 50 ug via INTRAVENOUS

## 2020-01-12 MED ORDER — ACETAMINOPHEN-CODEINE #3 300-30 MG PO TABS
1.0000 | ORAL_TABLET | Freq: Four times a day (QID) | ORAL | Status: DC | PRN
  Administered 2020-01-12 – 2020-01-13 (×3): 2 via ORAL
  Filled 2020-01-12 (×3): qty 2

## 2020-01-12 MED ORDER — LIDOCAINE 2% (20 MG/ML) 5 ML SYRINGE
INTRAMUSCULAR | Status: DC | PRN
  Administered 2020-01-12: 40 mg via INTRAVENOUS

## 2020-01-12 MED ORDER — PHENYLEPHRINE HCL-NACL 10-0.9 MG/250ML-% IV SOLN
INTRAVENOUS | Status: DC | PRN
  Administered 2020-01-12: 25 ug/min via INTRAVENOUS

## 2020-01-12 MED ORDER — ROCURONIUM BROMIDE 10 MG/ML (PF) SYRINGE
PREFILLED_SYRINGE | INTRAVENOUS | Status: DC | PRN
  Administered 2020-01-12: 50 mg via INTRAVENOUS
  Administered 2020-01-12: 10 mg via INTRAVENOUS

## 2020-01-12 MED ORDER — PROPOFOL 10 MG/ML IV BOLUS
INTRAVENOUS | Status: DC | PRN
  Administered 2020-01-12: 50 mg via INTRAVENOUS
  Administered 2020-01-12: 150 mg via INTRAVENOUS

## 2020-01-12 MED ORDER — ONDANSETRON HCL 4 MG/2ML IJ SOLN: 4.0000 mg | Freq: Four times a day (QID) | INTRAMUSCULAR | Status: DC | PRN

## 2020-01-12 MED ORDER — FENTANYL CITRATE (PF) 100 MCG/2ML IJ SOLN: 25.0000 ug | INTRAMUSCULAR | Status: DC | PRN

## 2020-01-12 MED ORDER — SUGAMMADEX SODIUM 200 MG/2ML IV SOLN
INTRAVENOUS | Status: DC | PRN
  Administered 2020-01-12: 200 mg via INTRAVENOUS

## 2020-01-12 MED ORDER — LACTATED RINGERS IV SOLN
INTRAVENOUS | Status: DC
  Administered 2020-01-12: 1000 mL via INTRAVENOUS

## 2020-01-12 NOTE — TOC Initial Note (Signed)
Transition of Care [facility]) - Initial/Assessment Note    Patient Details  Name: Stefanie Camacho MRN: [id number] Date of Birth: October 30, 1945  Transition of Care [facility]) CM/SW Contact:    Joanne Chars, LCSW Phone Number: 01/12/2020, 9:17 AM  Clinical Narrative:   CSW met with pt to discuss discharge recommendations.  Pt agreeable to outpt PT/OT as recommended, still waiting on further testing today.  Permission given to speak with any of her four sons and to outpt provider.  Pt sons live locally and provide good support.  Pt is vaccinated.  Pt reports she has walker at home currently, no other equipment.                  Expected Discharge Plan: Home/Self Care Barriers to Discharge: Continued Medical Work up   Patient Goals and CMS Choice Patient states their goals for this hospitalization and ongoing recovery are:: "I don't want to hurt"      Expected Discharge Plan and Services Expected Discharge Plan: Home/Self Care     Post Acute Care Choice:  (outpt PT/OT) Living arrangements for the past 2 months: Single Family Home                                      Prior Living Arrangements/Services Living arrangements for the past 2 months: Single Family Home Lives with:: Self Patient language and need for interpreter reviewed:: Yes Do you feel safe going back to the place where you live?: Yes      Need for Family Participation in Patient Care: No (Comment) Care giver support system in place?: Yes (comment)   Criminal Activity/Legal Involvement Pertinent to Current Situation/Hospitalization: No - Comment as needed  Activities of Daily Living Home Assistive Devices/Equipment: Walker (specify type) ADL Screening (condition at time of admission) Patient's cognitive ability adequate to safely complete daily activities?: Yes Is the patient deaf or have difficulty hearing?: No Does the patient have difficulty seeing, even when wearing glasses/contacts?: No Does the patient have  difficulty concentrating, remembering, or making decisions?: No Patient able to express need for assistance with ADLs?: Yes Does the patient have difficulty dressing or bathing?: No Independently performs ADLs?: Yes (appropriate for developmental age) Does the patient have difficulty walking or climbing stairs?: Yes Weakness of Legs: Both Weakness of Arms/Hands: None  Permission Sought/Granted Permission sought to share information with : Family Supports, Chartered certified accountant granted to share information with : Yes, Verbal Permission Granted  Share Information with NAME: Pt gave permission for any of her four sons.  Permission granted to share info w AGENCY: outpt PT/OT        Emotional Assessment Appearance:: Appears stated age Attitude/Demeanor/Rapport: Engaged Affect (typically observed): Pleasant Orientation: : Oriented to Self, Oriented to Place, Oriented to  Time, Oriented to Situation Alcohol / Substance Use: Not Applicable Psych Involvement: No (comment)  Admission diagnosis:  TIA (transient ischemic attack) [G45.9] Acute CVA (cerebrovascular accident) [facility]) [I63.9] Patient Active Problem List   Diagnosis Date Noted  . Acute CVA (cerebrovascular accident) ([place]) 01/09/2020  . Intracranial bleeding ([place]) 01/09/2020  . TIA (transient ischemic attack) 01/08/2020  . Lumbar foraminal stenosis 12/30/2019  . Vertigo 09/25/2019  . Sleep apnea   . GERD (gastroesophageal reflux disease)   . Anxiety   . NICM (nonischemic cardiomyopathy) ([place])   . Hypothyroidism   . Nonischemic cardiomyopathy ([place]) 06/04/2018  . Essential hypertension 06/04/2018  .  Left bundle branch block 06/04/2018  . Dyslipidemia 06/04/2018   PCP:  [name], MD Pharmacy:   [facility], [place] [address] Phone: [phone] Fax: [phone]  [facility], [place]  [address] [address] Phone: [phone] Fax: [phone]     Social Determinants of Health (SDOH) Interventions    Readmission Risk Interventions No flowsheet data found.

## 2020-01-12 NOTE — Op Note (Signed)
Video Bronchoscopy with Endobronchial Ultrasound Procedure Note  Date of Operation: 01/12/2020  Pre-op Diagnosis: Left lower lobe lung mass, multiple pulmonary nodules  Post-op Diagnosis: Left lower lobe lung mass, multiple pulmonary nodules  Surgeon: Garner Nash   Assistants: none  Anesthesia: General endotracheal anesthesia  Operation: Flexible video fiberoptic bronchoscopy with endobronchial ultrasound and biopsies.  Estimated Blood Loss: Minimal  Complications: none   Indications and History: Stefanie Camacho is a 74 y.o. female with left lower lobe lung mass, multiple pulmonary nodules, CT imaging consistent with metastatic disease.  The risks, benefits, complications, treatment options and expected outcomes were discussed with the patient.  The possibilities of pneumothorax, pneumonia, reaction to medication, pulmonary aspiration, perforation of a viscus, bleeding, failure to diagnose a condition and creating a complication requiring transfusion or operation were discussed with the patient who freely signed the consent.    Description of Procedure: The patient was examined in the preoperative area and history and data from the preprocedure consultation were reviewed. It was deemed appropriate to proceed.  The patient was taken to [facility] endoscopy room 2, identified as Stefanie Camacho and the procedure verified as Flexible Video Fiberoptic Bronchoscopy.  A Time Out was held and the above information confirmed. After being taken to the operating room general anesthesia was initiated and the patient  was orally intubated. The video fiberoptic bronchoscope was introduced via the endotracheal tube and a general inspection was performed which showed normal right and left lung anatomy with no evidence of endobronchial disease. The standard scope was then withdrawn and the endobronchial ultrasound was used to identify and characterize the peritracheal, hilar and bronchial lymph nodes.  Inspection showed large 3.5 to 4 cm in cross-section left lower lobe lung mass visible posteriorly under ultrasound. Using real-time ultrasound guidance Wang needle biopsies were take from the left lower lobe lung mass and were sent for cytology. The patient tolerated the procedure well without apparent complications.  Following endoscopic ultrasound-guided transbronchial needle aspirations we used a cytology brush to obtain specimens from the posterior segment of the left lower lobe.  We also used [facility] 2.4 mm forceps to obtain transbronchial biopsies from the superior segment of the left lower lobe as well as the posterior segment of the left lower lobe.  Following tissue specimen collection a BAL was performed in the left lower lobe with moderate return using saline.  There was no significant blood loss.  The bronchoscope was used for therapeutic suctioning of blood clots and any remaining secretions from the bilateral mainstem's.  The bronchoscope was withdrawn. Anesthesia was reversed and the patient was taken to the PACU for recovery.   Samples: 1. Wang needle biopsies from left lower lobe mass 2.  Cytology brushings from the left lower lobe 3.  Transbronchial biopsies left lower lobe 4.  BAL left lower lobe  Plans:  The patient will be discharged from the PACU to home when recovered from anesthesia. We will review the cytology, pathology and microbiology results with the patient when they become available. Outpatient followup will be with Garner Nash, DO.   Garner Nash, DO [facility] 01/12/2020 2:35 PM

## 2020-01-12 NOTE — H&P (View-Only) (Signed)
NAME:  Stefanie Camacho, MRN:  [id number], DOB:  Sep 07, 1945, LOS: 3 ADMISSION DATE:  01/08/2020, CONSULTATION DATE:  01/11/20 REFERRING MD:  Darrick Meigs, CHIEF COMPLAINT:  Facial numbness   Brief History   74 year old woman here for stroke workup found to have evidence of widespread cancer and question of lung origin.  History of present illness   74 year old nonsmoker with history noted below who presented with transient neurological complaints on 9/4 (self limited slurred speech, self-limited R facial numbbness, difficulty with balance).  Underwent usual workup for TIA and found to have widespread cancer to brain, skeleton, lung.  Pulmonology consulted for consideration of diagnostic bronchoscopy.   Patient has had a prior hysterectomy, no prior cancers, and mammograms up to date.  Denies hemoptysis, has mild cough for past month.  ~5 lb weight loss over past 3 months.  Past Medical History  OSA NICM LBBB HLD Anxiety  Significant Hospital Events   9/4 admitted  Consults:  Neurology, Oncology  Procedures:  N/A  Significant Diagnostic Tests:   MRI Brain IMPRESSION: 1. 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 2. 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  CT C/A/P IMPRESSION: 1. Dominant mass in the left lower lobe consistent with a primary bronchogenic carcinoma. This measures approximately 7.8 x 4.2 x 4.8 cm. 2. Extensive metastatic disease to both lungs with numerous lung nodules throughout all lobes. 2.0 x 1.1 cm soft tissue mass versus a mildly enlarged lymph node in the anterior mediastinum anterior to the heart, possibly metastatic disease. 3. Skeletal metastatic disease with lucent bone lesions, the most definitively seen within the right T5 transverse process, the sacrum, right ilium and left anterior acetabulum/pubis. 4. No other evidence of metastatic disease. 5. No acute findings. 6. 3 cm right liver lobe  hemangioma.  Echo 9/4 1. Left ventricular ejection fraction, by estimation, is 55 to 60%. The  left ventricle has normal function. The left ventricle has no regional  wall motion abnormalities. There is mild left ventricular hypertrophy.  Left ventricular diastolic parameters  are consistent with Grade I diastolic dysfunction (impaired relaxation).  2. Right ventricular systolic function is normal. The right ventricular  size is normal.  3. The mitral valve is grossly normal. Trivial mitral valve  regurgitation.  4. The aortic valve is grossly normal. Aortic valve regurgitation is not  visualized. No aortic stenosis is present.   Micro Data:  COVID neg  Antimicrobials:  N/A   Interim history/subjective:   Doing well this afternoon. A little nervous but ready to move forward. No barriers to proceed.   Objective   Blood pressure 105/60, pulse 62, temperature 98 F (36.7 C), temperature source Oral, resp. rate 20, height 5\' 6"  (1.676 m), weight 87.8 kg, SpO2 95 %.       No intake or output data in the 24 hours ending 01/12/20 0740 Filed Weights   01/08/20 1312 01/09/20 0041  Weight: 89.8 kg 87.8 kg    Examination: General appearance: 74 y.o., female, NAD, conversant  Eyes: anicteric sclerae, moist conjunctivae; no lid-lag; PERRLA, tracking appropriately HENT: NCAT; oropharynx,  Neck: Trachea midline; FROM, supple, lymphadenopathy, no JVD Lungs: CTAB, no crackles, no wheeze,  CV: RRR, S1, S2, no MRGs  Abdomen: Soft, non-tender; non-distended, BS present  Extremities: No peripheral edema, radial and DP pulses present bilaterally  Skin: Normal temperature, turgor and texture; no rash Psych: Appropriate affect Neuro: Alert and oriented to person and place, no focal deficit  Resolved Hospital Problem list   N/A  Assessment & Plan:   Metastatic Cancer To Brain, Lungs, Bone Stage 4 disease  LLL lung mass, possible primary  Plan: Discussed r/b/a of  procedure The patient is agreeable to proceed.  Bronchoscopy this afternoon to establish tissue diagnosis.   Labs   CBC: Recent Labs  Lab 01/08/20 1316 01/11/20 2012  WBC 8.1 8.4  NEUTROABS 4.8  --   HGB 14.6 13.2  HCT 45.3 41.8  MCV 87.8 88.6  PLT 262 505    Basic Metabolic Panel: Recent Labs  Lab 01/08/20 1316 01/10/20 0343 01/11/20 2012  NA 135 137 136  K 4.7 4.3 4.9  CL 101 102 101  CO2 26 26 28   GLUCOSE 103* 104* 112*  BUN 23 24* 23  CREATININE 0.89 1.15* 1.12*  CALCIUM 10.2 10.0 9.8   GFR: Estimated Creatinine Clearance: 50.7 mL/min (A) (by C-G formula based on SCr of 1.12 mg/dL (H)). Recent Labs  Lab 01/08/20 1316 01/11/20 2012  WBC 8.1 8.4    Liver Function Tests: Recent Labs  Lab 01/08/20 1316 01/10/20 0343 01/11/20 2012  AST 15 18 16   ALT 18 16 16   ALKPHOS 243* 223* 221*  BILITOT 0.7 1.0 0.6  PROT 7.8 6.5 6.7  ALBUMIN 3.9 3.3* 3.4*   No results for input(s): LIPASE, AMYLASE in the last 168 hours. No results for input(s): AMMONIA in the last 168 hours.  ABG No results found for: PHART, PCO2ART, PO2ART, HCO3, TCO2, ACIDBASEDEF, O2SAT   Coagulation Profile: Recent Labs  Lab 01/08/20 1316 01/11/20 2012  INR 1.0 1.0    Cardiac Enzymes: No results for input(s): CKTOTAL, CKMB, CKMBINDEX, TROPONINI in the last 168 hours.  HbA1C: Hgb A1c MFr Bld  Date/Time Value Ref Range Status  01/09/2020 04:16 AM 6.0 (H) 4.8 - 5.6 % Final    Comment:    (NOTE) Pre diabetes:          5.7%-6.4%  Diabetes:              >6.4%  Glycemic control for   <7.0% adults with diabetes     CBG: Recent Labs  Lab 01/08/20 1347  GLUCAP 96    Garner Nash, DO [facility] 01/12/2020 7:40 AM

## 2020-01-12 NOTE — Interval H&P Note (Signed)
History and Physical Interval Note:  01/12/2020 12:57 PM  Stefanie Camacho  has presented today for surgery, with the diagnosis of lung mass.  The various methods of treatment have been discussed with the patient and family. After consideration of risks, benefits and other options for treatment, the patient has consented to  Procedure(s): VIDEO BRONCHOSCOPY WITH FLUORO (N/A) as a surgical intervention.  The patient's history has been reviewed, patient examined, no change in status, stable for surgery.  I have reviewed the patient's chart and labs.  Questions were answered to the patient's satisfaction.     [place]

## 2020-01-12 NOTE — Transfer of Care (Signed)
Immediate Anesthesia Transfer of Care Note  Patient: Saphyre Cillo  Procedure(s) Performed: VIDEO BRONCHOSCOPY WITH ENDOBRONCHIAL ULTRASOUND BRONCHIAL NEEDLE ASPIRATION BIOPSIES BRONCHIAL BRUSHINGS BRONCHIAL BIOPSIES BRONCHIAL WASHINGS  Patient Location: Endoscopy Unit  Anesthesia Type:General  Level of Consciousness: awake and alert   Airway & Oxygen Therapy: Patient Spontanous Breathing and Patient connected to nasal cannula oxygen  Post-op Assessment: Report given to RN and Post -op Vital signs reviewed and stable  Post vital signs: Reviewed and stable  Last Vitals:  Vitals Value Taken Time  BP    Temp    Pulse    Resp    SpO2      Last Pain:  Vitals:   01/12/20 1244  TempSrc: Oral  PainSc:       Patients Stated Pain Goal: 0 (18/33/58 2518)  Complications: No complications documented.

## 2020-01-12 NOTE — Discharge Instructions (Signed)
Flexible Bronchoscopy, Care After This sheet gives you information about how to care for yourself after your test. Your doctor may also give you more specific instructions. If you have problems or questions, contact your doctor. Follow these instructions at home: Eating and drinking  The day after the test, go back to your normal diet. Driving  Do not drive for 24 hours if you were given a medicine to help you relax (sedative).  Do not drive or use heavy machinery while taking prescription pain medicine. General instructions   Take over-the-counter and prescription medicines only as told by your doctor.  Return to your normal activities as told. Ask what activities are safe for you.  Do not use any products that have nicotine or tobacco in them. This includes cigarettes and e-cigarettes. If you need help quitting, ask your doctor.  Keep all follow-up visits as told by your doctor. This is important. It is very important if you had a tissue sample (biopsy) taken. Get help right away if:  You have shortness of breath that gets worse.  You get light-headed.  You feel like you are going to pass out (faint).  You have chest pain.  You cough up: ? More than a little blood. ? More blood than before. Summary  Do not eat or drink anything (not even water) for 2 hours after your test, or until your numbing medicine wears off.  Do not use cigarettes. Do not use e-cigarettes.  Get help right away if you have chest pain. This information is not intended to replace advice given to you by your health care provider. Make sure you discuss any questions you have with your health care provider. Document Revised: 04/05/2017 Document Reviewed: 05/11/2016 Elsevier Patient Education  2020 [employer].

## 2020-01-12 NOTE — Progress Notes (Addendum)
[facility]  PROGRESS NOTE  Stefanie Camacho NLZ:[id number] DOB: December 14, 1945 DOA: 01/08/2020 PCP: [name], MD   Brief HPI:    74 year old female with a history of nonischemic cardiomyopathy, hypothyroidism, hypertension who experienced slurred speech about a week ago when she was talking to her friend on phone.  This lasted for about 15 minutes and resolved.  About 4 days ago patient had right facial numbness which lasted for few seconds and resolved.  She was advised to come to the ED.  She did not have weakness of upper or lower extremities difficulty swallowing or any visual symptoms.  In the ED CT head was unremarkable MRI of the brain and MRI of the brain showed hemorrhagic conversion of stroke.  Neurology was consulted. CTA neck showed multiple pulmonary nodules, CT chest/abdomen/pelvis was obtained which showed large left lung mass with pulmonary metastasis, consistent with bronchogenic carcinoma. There is also metastasis to spine and ileum, left acetabulum.   Subjective   Patient seen and examined, she has been diagnosed with metastatic lung cancer.  With mets to lungs and spine.  Palliative care was consulted, plan for bronchoscopy with biopsy of lung mass today.   Assessment/Plan:     1. ? Acute CVA with hemorrhagic conversion-discussed with neurology, patient does not have stroke.  Continue aspirin 81 mg daily, Lipitor 10 mg daily, both aspirin and Lipitor are her home medications.  Lower extremity Dopplers negative for DVT. 2. Metastatic lung cancer-CT chest/abdomen/pelvis showed large left lung mass with mets to lungs, spine.  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  Pulmonology was consulted, plan to undergo bronchoscopy today for tissue diagnosis.  Palliative care has been consulted.  Will await biopsy results.  Patient will need to follow-up with oncology as outpatient.     3. ?  Seizure-patient was having numbness of  right lip, discussed with neurology, IV Keppra was recommended.  1 g IV Keppra was given in the morning.  Will start Keppra 500 p.o. twice daily.  Also started on  Decadron 4 mg IV every 12 hours for minimal cerebral edema.  Discussed with neurology and oncology Dr Alen Blew,, patient does not have significant edema.  Will discontinue Decadron at this time.   EEG has been negative for epileptiform discharges.  Patient can continue with Keppra 500 mg p.o. twice daily at discharge. 4. Hypertension-blood pressures controlled, continue metoprolol, losartan, spironolactone 5. Hyperlipidemia-continue Lipitor 10 mg daily 6. Hypothyroidism-continue Synthroid    Follow-ups PCCM Dr. Valeta Harms Oncology-Dr. Julien Nordmann, ambulatory referral to oncology made.  COVID-19 Labs  No results for input(s): DDIMER, FERRITIN, LDH, CRP in the last 72 hours.  Lab Results  Component Value Date   [place] NEGATIVE 01/08/2020     Scheduled medications:   . aspirin EC  81 mg Oral Daily  . atorvastatin  10 mg Oral q1800  . dexamethasone  4 mg Oral Q12H  . FLUoxetine  10 mg Oral Daily  . levETIRAcetam  500 mg Oral BID  . levothyroxine  75 mcg Oral Daily  . losartan  50 mg Oral Daily  . metoprolol succinate  50 mg Oral Daily  . pantoprazole  40 mg Oral Daily  . spironolactone  12.5 mg Oral Daily         CBG: Recent Labs  Lab 01/08/20 1347 01/12/20 0843  GLUCAP 96 146*    SpO2: 95 % O2 Flow Rate (L/min): 4 L/min    CBC: Recent Labs  Lab 01/08/20 1316 01/11/20 2012  WBC 8.1 8.4  NEUTROABS 4.8  --   HGB 14.6 13.2  HCT 45.3 41.8  MCV 87.8 88.6  PLT 262 945    Basic Metabolic Panel: Recent Labs  Lab 01/08/20 1316 01/10/20 0343 01/11/20 2012  NA 135 137 136  K 4.7 4.3 4.9  CL 101 102 101  CO2 26 26 28   GLUCOSE 103* 104* 112*  BUN 23 24* 23  CREATININE 0.89 1.15* 1.12*  CALCIUM 10.2 10.0 9.8     Liver Function Tests: Recent Labs  Lab 01/08/20 1316 01/10/20 0343 01/11/20 2012   AST 15 18 16   ALT 18 16 16   ALKPHOS 243* 223* 221*  BILITOT 0.7 1.0 0.6  PROT 7.8 6.5 6.7  ALBUMIN 3.9 3.3* 3.4*     Antibiotics: Anti-infectives (From admission, onward)   None       DVT prophylaxis: SCDs  Code Status: Full code  Family Communication: No family at bedside    Status is: Inpatient  Dispo: The patient is from: Home              Anticipated d/c is to: Home              Anticipated d/c date is: 01/13/2020              Patient currently not stable for discharge  Barrier to discharge-ongoing work-up for left lung mass     Consultants:  Neurology  Procedures:     Objective   Vitals:   01/12/20 1433 01/12/20 1442 01/12/20 1452 01/12/20 1503  BP: (!) 123/57 133/61 (!) 123/58 127/61  Pulse: 85 80 82 80  Resp: 20 14 18 13   Temp: (!) 97.2 F (36.2 C)     TempSrc: Axillary     SpO2: 93% 93% 99% 95%  Weight:      Height:        Intake/Output Summary (Last 24 hours) at 01/12/2020 1531 Last data filed at 01/12/2020 1417 Gross per 24 hour  Intake 400 ml  Output --  Net 400 ml    No intake/output data recorded.  Filed Weights   01/08/20 1312 01/09/20 0041 01/12/20 1243  Weight: 89.8 kg 87.8 kg 87.8 kg    Physical Examination:  General-appears in no acute distress Heart-S1-S2, regular, no murmur auscultated Lungs-clear to auscultation bilaterally, no wheezing or crackles auscultated Abdomen-soft, nontender, no organomegaly Extremities-no edema in the lower extremities Neuro-alert, oriented x3, no focal deficit noted   Data Reviewed:   Recent Results (from the past 240 hour(s))  SARS Coronavirus 2 by RT PCR (hospital order, performed in [facility] hospital lab) Nasopharyngeal Nasopharyngeal Swab     Status: None   Collection Time: 01/08/20  4:28 PM   Specimen: Nasopharyngeal Swab  Result Value Ref Range Status   SARS Coronavirus 2 NEGATIVE NEGATIVE Final    Comment: (NOTE) SARS-CoV-2 target nucleic acids are NOT DETECTED.  The  SARS-CoV-2 RNA is generally detectable in upper and lower respiratory specimens during the acute phase of infection. The lowest concentration of SARS-CoV-2 viral copies this assay can detect is 250 copies / mL. A negative result does not preclude SARS-CoV-2 infection and should not be used as the sole basis for treatment or other patient management decisions.  A negative result may occur with improper specimen collection / handling, submission of specimen other than nasopharyngeal swab, presence of viral mutation(s) within the areas targeted by this assay, and inadequate number of viral copies (<250 copies / mL). A negative result must  be combined with clinical observations, patient history, and epidemiological information.  Fact Sheet for Patients:   StrictlyIdeas.no  Fact Sheet for Healthcare Providers: BankingDealers.co.za  This test is not yet approved or  cleared by the [place] FDA and has been authorized for detection and/or diagnosis of SARS-CoV-2 by FDA under an Emergency Use Authorization (EUA).  This EUA will remain in effect (meaning this test can be used) for the duration of the COVID-19 declaration under Section 564(b)(1) of the Act, 21 U.S.C. section 360bbb-3(b)(1), unless the authorization is terminated or revoked sooner.  Performed at [facility], [address] [place], [place] [address]     No results for input(s): LIPASE, AMYLASE in the last 168 hours. No results for input(s): AMMONIA in the last 168 hours.  Cardiac Enzymes: No results for input(s): CKTOTAL, CKMB, CKMBINDEX, TROPONINI in the last 168 hours. BNP (last 3 results) No results for input(s): BNP in the last 8760 hours.  ProBNP (last 3 results) No results for input(s): PROBNP in the last 8760 hours.  Studies:  MR BRAIN W CONTRAST  Result Date: 01/10/2020 CLINICAL DATA:  Non-small cell lung cancer staging. EXAM: MRI HEAD WITH  CONTRAST TECHNIQUE: Multiplanar, multiecho pulse sequences of the brain and surrounding structures were obtained with intravenous contrast. CONTRAST:  51mL GADAVIST GADOBUTROL 1 MMOL/ML IV SOLN COMPARISON:  Noncontrast head MRI 01/09/2020 FINDINGS: There are numerous enhancing lesions in both cerebral hemispheres with the largest measuring 1.4 cm in the left temporal lobe (series 5, image 19). There is minimal edema associated with this lesion as well as with a slightly smaller lesion more posteriorly at the left temporo-occipital junction. There are also multiple areas of abnormal leptomeningeal enhancement bilaterally, greatest in the left occipital region. No enhancing lesions are identified in the posterior fossa. An enhancing right frontoparietal skull lesion measures 1.6 cm (series 5, image 45). IMPRESSION: 1. 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 2. 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 Electronically Signed   By: Logan Bores M.D.   On: 01/10/2020 16:35   DG CHEST PORT 1 VIEW  Result Date: 01/12/2020 CLINICAL DATA:  Status post bronchoscopy and biopsy today. EXAM: PORTABLE CHEST 1 VIEW COMPARISON:  CT chest, abdomen and pelvis 01/10/2020. FINDINGS: There is no pneumothorax after bronchoscopy. Innumerable bilateral pulmonary nodules and a left lower lobe mass are again seen. Heart size is normal. No pleural fluid. IMPRESSION: Negative for pneumothorax after bronchoscopy. Left lower lobe mass and innumerable pulmonary nodules as seen on prior CT. Electronically Signed   By: Inge Rise M.D.   On: 01/12/2020 15:03   EEG adult  Result Date: 01/12/2020 Lora Havens, MD     01/12/2020  1:46 PM Patient Name: Stefanie Camacho MRN: [id number] Epilepsy Attending: Lora Havens Referring Physician/Provider: Dr. Eleonore Chiquito Date: 01/11/2020 Duration: 23.03 minutes Patient history: 74 year old female with transient right facial numbness.   EEG to evaluate for seizures. Level of alertness: Awake AEDs during EEG study: Keppra Technical aspects: This EEG study was done with scalp electrodes positioned according to the 10-20 International system of electrode placement. Electrical activity was acquired at a sampling rate of 500Hz  and reviewed with a high frequency filter of 70Hz  and a low frequency filter of 1Hz . EEG data were recorded continuously and digitally stored. Description: The posterior dominant rhythm consists of 10 Hz activity of moderate voltage (25-35 uV) seen predominantly in posterior head regions, symmetric and reactive to eye opening and eye closing.  Hyperventilation and photic stimulation were not performed.   IMPRESSION: This study is within normal limits. No seizures or epileptiform discharges were seen throughout the recording. [place]   [facility] If 7PM-7AM, please contact night-coverage at [email or web address], Office  [phone]   01/12/2020, 3:31 PM  LOS: 3 days

## 2020-01-12 NOTE — Consult Note (Signed)
Palliative care consult note  Reason for consult: Goals of care in light of newly discovered widely metastatic disease of unknown primary (biopsy pending)  Palliative care consult received.  Chart reviewed including personal review of pertinent labs and imaging.  Briefly, Stefanie Camacho is a 74 year old female with past medical history of nonischemic cardiomyopathy, hypothyroidism, hypertension, and back pain/sciatica who presented to the hospital after having period of slurred speech last week.  Work-up for this revealed hemorrhagic stroke as well as metastatic lesions.  Further imaging has revealed widespread disease with multiple pulmonary nodules, large left lung mass with pulmonary metastasis with multiple nodules throughout the lungs, and metastasis to spine and ilium as well as left acetabulum.  Unknown primary at this time but consistent with bronchogenic carcinoma.  Plan is for bronchoscopy with biopsy today.  Palliative consulted for goals of care.  I met today with [name].    I introduced palliative care as specialized medical care for people living with serious illness. It focuses on providing relief from the symptoms and stress of a serious illness. The goal is to improve quality of life for both the patient and the family.  Stefanie Camacho reports the most important things to her at this point in time are her children, grandchildren, and her dog, Stefanie Camacho.  She has 3 sons as well as 1 daughter.  She has 4 granddaughters whom she absolutely adores.  She prior worked in [employer] research (medical) and had been active when she retired in 2016 but this is slowed down considerably as she had increase in pain and with the Covid pandemic.  She now reports she spends most of her day in her recliner at home.  She reports still being blindsided by discovery of widely metastatic cancer.  She says that both heart disease and stroke run in her family and so this sort of event was unexpected, but nobody in her family has cancer and  caught her off guard that she had such widely metastatic disease.  She reports she is still coming to terms with this as she only found this out a couple of days ago but she seems to be very realistic with her questions and wanting to plan for the future.  We discussed plan moving forward at this point is for bronchoscopy for biopsy to gain further information about particulars of cancer which would help determine if she is a candidate for disease modifying therapy such as chemotherapy, immunotherapy, surgery, or radiation.  We discussed that this is not going to go away and the goal of any of these modalities would be to help add time in quality to her life and not curative intent.  She clearly understands this.  She tells me that she has paperwork at home where she has outlined her advanced directives.  She states that her son, Stefanie Camacho, is her healthcare power of attorney and she does have a living will at home.  We discussed that in light of newly discovered widely metastatic disease, focus should be on interventions that are likely to allow her to achieve goal of getting back to home and spending time with family. I discussed with [name] regarding heroic interventions at the end-of-life and she agrees this would not be in line with prior expressed wishes for a natural death or be likely to lead to getting well enough to go back home.  She was in agreement with changing CODE STATUS to DO NOT RESUSCITATE.  We also discussed pain in her back and that  she is hopeful to be able to have epidural steroid injection that was scheduled in a couple of weeks.  She reports at this time that her pain has been better controlled since she has been taking 2 of the acetaminophen/codeine tablets as needed here in the hospital.  She reports that she does have increased pain about an hour prior to being due for the next dose.  We discussed options for pain management including continuation of current medication versus  working to rotate to a different opioid.  She reports having oxycodone in the past and she did not feel this helped her any better than the Tylenol with codeine she is currently ordered.  At this point, she elected to continue with Tylenol 3 with a plan to increase as needed frequency to every 6 hours.  We also discussed concern for constipation with increase in opioids.  Reports her last bowel movement yesterday.  Discussed plan to continue to follow closely and add on bowel regimen as needed.  -DNR/DNI -Stefanie Camacho appears to have good understanding about the severity of her condition.  She understands that this is not curable condition and she is open to very realistic conversation about next steps once we have more information from biopsy available.  She reports that overall she would like to focus more on quality of life but is open to consideration of disease modifying therapy if the goal of those therapies is to add time or quality to her life. -Her son Stefanie Camacho is her healthcare power of attorney.  She reports there is paperwork to designate this and she will have him bring a copy to the hospital to place in her chart. -Pain: Discussed options for pain management with opioids and using pain medication as needed if it will help her improve her functional status.  We discussed functional status being very important to her moving forward in light of her incurable metastatic disease.  For now, continue with Tylenol/codeine 300/31 to 2 tablets every 6 hours as needed.  I would have a low threshold to rotate her to a stronger opioid without Tylenol component if needed to control her pain. -Plan for further palliative care follow-up once biopsy completed.  Start time: 0915 End time: 1035 Total time: 80 minutes  Greater than 50%  of this time was spent counseling and coordinating care related to the above assessment and plan.  Micheline Rough, MD [facility] [phone]

## 2020-01-12 NOTE — Anesthesia Preprocedure Evaluation (Signed)
Anesthesia Evaluation  Patient identified by MRN, date of birth, ID band Patient awake    Reviewed: Allergy & Precautions, H&P , NPO status , Patient's Chart, lab work & pertinent test results  Airway Mallampati: II   Neck ROM: full    Dental   Pulmonary sleep apnea ,    breath sounds clear to auscultation       Cardiovascular hypertension, + dysrhythmias  Rhythm:regular Rate:Normal  LBBB. Nonischemic CM.   Neuro/Psych Anxiety TIACVA    GI/Hepatic GERD  ,  Endo/Other  Hypothyroidism   Renal/GU      Musculoskeletal   Abdominal   Peds  Hematology   Anesthesia Other Findings   Reproductive/Obstetrics                             Anesthesia Physical Anesthesia Plan  ASA: II  Anesthesia Plan: MAC   Post-op Pain Management:    Induction: Intravenous  PONV Risk Score and Plan: 2 and Propofol infusion, Ondansetron and Treatment may vary due to age or medical condition  Airway Management Planned: Nasal Cannula  Additional Equipment:   Intra-op Plan:   Post-operative Plan:   Informed Consent: I have reviewed the patients History and Physical, chart, labs and discussed the procedure including the risks, benefits and alternatives for the proposed anesthesia with the patient or authorized representative who has indicated his/her understanding and acceptance.       Plan Discussed with: CRNA, Anesthesiologist and Surgeon  Anesthesia Plan Comments:         Anesthesia Quick Evaluation

## 2020-01-12 NOTE — Procedures (Signed)
Patient Name: Stefanie Camacho  MRN: [id number]  Epilepsy Attending: Lora Havens  Referring Physician/Provider: Dr. Eleonore Chiquito Date: 01/11/2020 Duration: 23.03 minutes  Patient history: 74 year old female with transient right facial numbness.  EEG to evaluate for seizures.  Level of alertness: Awake  AEDs during EEG study: Keppra  Technical aspects: This EEG study was done with scalp electrodes positioned according to the 10-20 International system of electrode placement. Electrical activity was acquired at a sampling rate of 500Hz  and reviewed with a high frequency filter of 70Hz  and a low frequency filter of 1Hz . EEG data were recorded continuously and digitally stored.   Description: The posterior dominant rhythm consists of 10 Hz activity of moderate voltage (25-35 uV) seen predominantly in posterior head regions, symmetric and reactive to eye opening and eye closing. Hyperventilation and photic stimulation were not performed.     IMPRESSION: This study is within normal limits. No seizures or epileptiform discharges were seen throughout the recording.  Noela Brothers Barbra Sarks

## 2020-01-12 NOTE — Progress Notes (Signed)
Physical Therapy Treatment Patient Details Name: Stefanie Camacho MRN: [id number] DOB: 17-Jan-1946 Today's Date: 01/12/2020    History of Present Illness 74 year old female with a small hemorrhagic lesion in the left occipital lobe. Presented with transient episode of numbness.  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 Additional mets to brain.    PT Comments    Pt with prominent antalgic gait this session, pt stating R hip being uncomfortable during and immediately after ambulation. Pt instructed in gentle hip flexor stretches, as pt with tight hip flexors bilaterally R>L noted during gait. Pt with progressed ambulation distance today, ambulating great hallway distance with cues for form and safety with use of RW. PT to continue to follow acutely.    Follow Up Recommendations  Outpatient PT     Equipment Recommendations  None recommended by PT    Recommendations for Other Services       Precautions / Restrictions Precautions Precautions: Fall Precaution Comments: new diagonsis of lung ca with mets Restrictions Weight Bearing Restrictions: No    Mobility  Bed Mobility Overal bed mobility: Needs Assistance Bed Mobility: Rolling;Sit to Sidelying Rolling: Modified independent (Device/Increase time)       Sit to sidelying: Modified independent (Device/Increase time) General bed mobility comments: Pt sitting EOB upon PT arrival, mod I for return to bed for increased time.  Transfers Overall transfer level: Needs assistance Equipment used: Rolling walker (2 wheeled) Transfers: Sit to/from Stand Sit to Stand: Supervision         General transfer comment: for  safety  Ambulation/Gait Ambulation/Gait assistance: Supervision Gait Distance (Feet): 300 Feet Assistive device: Rolling walker (2 wheeled) Gait Pattern/deviations: Step-through pattern;Decreased stride length;Trunk flexed;Antalgic;Decreased stance time - right Gait velocity: decr   General Gait Details: supervision for safety, Verbal cuing for depressing shoulders during gait, upright posture. VSS   Stairs             Wheelchair Mobility    Modified Rankin (Stroke Patients Only) Modified Rankin (Stroke Patients Only) Pre-Morbid Rankin Score: No symptoms Modified Rankin: No symptoms     Balance Overall balance assessment: Needs assistance   Sitting balance-Leahy Scale: Good       Standing balance-Leahy Scale: Fair                              Cognition Arousal/Alertness: Awake/alert Behavior During Therapy: WFL for tasks assessed/performed Overall Cognitive Status: Within Functional Limits for tasks assessed                                        Exercises Other Exercises Other Exercises: sidelying hip extension stretch, to neutral, to address hip flexor tightness: hold 15 seconds, rest, x2    General Comments        Pertinent Vitals/Pain Pain Assessment: 0-10 Pain Score: 3  Pain Location: back, buttock hips, R Pain Descriptors / Indicators: Discomfort;Aching;Guarding Pain Intervention(s): Limited activity within patient's tolerance;Monitored during session;Repositioned    Home Living                      Prior Function  PT Goals (current goals can now be found in the care plan section) Acute Rehab PT Goals PT Goal Formulation: With patient Time For Goal Achievement: 01/16/20 Potential to Achieve Goals: Good Progress towards PT goals: Progressing toward goals    Frequency    Min 3X/week      PT Plan Current plan remains appropriate    Co-evaluation              AM-PAC PT "6  Clicks" Mobility   Outcome Measure  Help needed turning from your back to your side while in a flat bed without using bedrails?: None Help needed moving from lying on your back to sitting on the side of a flat bed without using bedrails?: None Help needed moving to and from a bed to a chair (including a wheelchair)?: None Help needed standing up from a chair using your arms (e.g., wheelchair or bedside chair)?: None Help needed to walk in hospital room?: None Help needed climbing 3-5 steps with a railing? : A Little 6 Click Score: 23    End of Session   Activity Tolerance: Patient tolerated treatment well Patient left: in bed;with call bell/phone within reach (transport chair with transport personnel to go down for MRI) Nurse Communication: Mobility status PT Visit Diagnosis: Other abnormalities of gait and mobility (R26.89);Muscle weakness (generalized) (M62.81);Other symptoms and signs involving the nervous system (R29.898)     Time: 6789-3810 PT Time Calculation (min) (ACUTE ONLY): 17 min  Charges:  $Gait Training: 8-22 mins                     Clevester Helzer E, PT Acute Rehabilitation Services Pager [phone]  Office [phone]   Shakim Faith D Elonda Husky 01/12/2020, 12:49 PM

## 2020-01-12 NOTE — Anesthesia Procedure Notes (Signed)
Procedure Name: Intubation Date/Time: 01/12/2020 1:00 PM Performed by: Wilburn Cornelia, CRNA Pre-anesthesia Checklist: Patient identified, Emergency Drugs available, Suction available, Patient being monitored and Timeout performed Patient Re-evaluated:Patient Re-evaluated prior to induction Oxygen Delivery Method: Circle system utilized Preoxygenation: Pre-oxygenation with 100% oxygen Induction Type: IV induction Ventilation: Mask ventilation without difficulty Grade View: Grade II Tube type: Oral Tube size: 8.5 mm Number of attempts: 3 Airway Equipment and Method: Stylet and Video-laryngoscopy Placement Confirmation: ETT inserted through vocal cords under direct vision,  positive ETCO2,  CO2 detector and breath sounds checked- equal and bilateral Secured at: 21 cm Tube secured with: Tape Dental Injury: Teeth and Oropharynx as per pre-operative assessment  Comments: DLx1 by H.lLee CRNA with MAC 3, grade 4 view. DLx1 by Dr. Marcie Bal with MAC 3 and bougie- grade 4 view, esophageal intubation.  DLx1 by Dr. Marcie Bal with glidescope - grade 2 view.  Confirmation of placement.  Minor lip lacerations present - lacrilube applied.

## 2020-01-12 NOTE — Progress Notes (Signed)
STROKE TEAM PROGRESS NOTE   INTERVAL HISTORY No family at the bedside. Pt stated no more episode of right facial numbness. On keppra. Pending biopsy with pulmonary. Pending oncology consult. Palliative care consulted and pt has good attitude towards the diagnosis. EEG result pending.    OBJECTIVE Vitals:   01/11/20 0750 01/11/20 1630 01/11/20 2005 01/12/20 0829  BP: 122/79 104/69 105/60 123/63  Pulse: 66 63 62 76  Resp:   20 16  Temp: 97.6 F (36.4 C) 98 F (36.7 C) 98 F (36.7 C)   TempSrc:   Oral   SpO2: 95% 93% 95%   Weight:      Height:       CBC:  Recent Labs  Lab 01/08/20 1316 01/11/20 2012  WBC 8.1 8.4  NEUTROABS 4.8  --   HGB 14.6 13.2  HCT 45.3 41.8  MCV 87.8 88.6  PLT 262 696   Basic Metabolic Panel:  Recent Labs  Lab 01/10/20 0343 01/11/20 2012  NA 137 136  K 4.3 4.9  CL 102 101  CO2 26 28  GLUCOSE 104* 112*  BUN 24* 23  CREATININE 1.15* 1.12*  CALCIUM 10.0 9.8   Lipid Panel:     Component Value Date/Time   CHOL 127 01/09/2020 0416   CHOL 133 07/17/2018 0933   TRIG 112 01/09/2020 0416   HDL 38 (L) 01/09/2020 0416   HDL 40 07/17/2018 0933   CHOLHDL 3.3 01/09/2020 0416   VLDL 22 01/09/2020 0416   LDLCALC 67 01/09/2020 0416   LDLCALC 67 07/17/2018 0933   HgbA1c:  Lab Results  Component Value Date   HGBA1C 6.0 (H) 01/09/2020   Urine Drug Screen: No results found for: LABOPIA, COCAINSCRNUR, LABBENZ, AMPHETMU, THCU, LABBARB  Alcohol Level No results found for: [facility]  IMAGING  CT HEAD WO CONTRAST 01/08/2020 IMPRESSION:  Negative CT head   CT ANGIO HEAD W OR WO CONTRAST CT ANGIO NECK W OR WO CONTRAST 01/09/2020 IMPRESSION:  1. No emergent large vessel occlusion or high-grade stenosis of the intracranial arteries.  2. Innumerable nodular pulmonary metastases.   MR BRAIN WO CONTRAST MR ANGIO HEAD WO CONTRAST 01/09/2020 IMPRESSION:  1. Small amount of hemorrhage, likely subacute, within the left occipital lobe. Given the multifocal left PCA  stenosis, this probably represents hemorrhage associated with a recent infarct.  2. Areas of hyperintense T2-weighted signal within the left temporal lobe may also be due to prior left PCA ischemic events.  3. Multifocal stenosis of the distal left PCA. Otherwise normal intracranial MRA.   CT CHEST ABDOMEN AND PELVIS W CONTRAST 01/10/20 IMPRESSION: 1. Dominant mass in the left lower lobe consistent with a primary bronchogenic carcinoma. This measures approximately 7.8 x 4.2 x 4.8 cm. 2. Extensive metastatic disease to both lungs with numerous lung nodules throughout all lobes. 2.0 x 1.1 cm soft tissue mass versus a mildly enlarged lymph node in the anterior mediastinum anterior to the heart, possibly metastatic disease. 3. Skeletal metastatic disease with lucent bone lesions, the most definitively seen within the right T5 transverse process, the sacrum, right ilium and left anterior acetabulum/pubis. 4. No other evidence of metastatic disease. 5. No acute findings. 6. 3 cm right liver lobe hemangioma.  MRI HEAD WITH CONTRAST 01/10/20 1. 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 2. 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  Transthoracic Echocardiogram  1. Left ventricular ejection fraction, by estimation, is 55 to 60%. The left ventricle has normal function. The left ventricle has  no regional wall motion abnormalities. There is mild left ventricular hypertrophy. Left ventricular diastolic parameters are consistent with Grade I diastolic dysfunction (impaired relaxation).  2. Right ventricular systolic function is normal. The right ventricular size is normal.  3. The mitral valve is grossly normal. Trivial mitral valve regurgitation.  4. The aortic valve is grossly normal. Aortic valve regurgitation is not visualized. No aortic stenosis is present.   ECG - SR rate 69 BPM. Probable old anterior infarct. (See cardiology reading for  complete details)  PHYSICAL EXAM    Temp:  [98 F (36.7 C)] 98 F (36.7 C) (09/06 2005) Pulse Rate:  [62-76] 76 (09/07 0829) Resp:  [16-20] 16 (09/07 0829) BP: (104-123)/(60-69) 123/63 (09/07 0829) SpO2:  [93 %-95 %] 95 % (09/06 2005)  General - Well nourished, well developed, in no apparent distress.  Ophthalmologic - fundi not visualized due to noncooperation.  Cardiovascular - Regular rhythm and rate.  Mental Status -  Level of arousal and orientation to time, place, and person were intact. Language including expression, naming, repetition, comprehension was assessed and found intact. Fund of Knowledge was assessed and was intact.  Cranial Nerves II - XII - II - Visual field intact OU. III, IV, VI - Extraocular movements intact. V - Facial sensation intact bilaterally. VII - Facial movement intact bilaterally. VIII - Hearing & vestibular intact bilaterally. X - Palate elevates symmetrically. XI - Chin turning & shoulder shrug intact bilaterally. XII - Tongue protrusion intact.  Motor Strength - The patient's strength was normal in all extremities and pronator drift was absent.  Bulk was normal and fasciculations were absent.   Motor Tone - Muscle tone was assessed at the neck and appendages and was normal.  Reflexes - The patient's reflexes were symmetrical in all extremities and she had no pathological reflexes.  Sensory - Light touch, temperature/pinprick were assessed and were symmetrical.    Coordination - The patient had normal movements in the hands and feet with no ataxia or dysmetria.  Tremor was absent.  Gait and Station - deferred.   ASSESSMENT/PLAN [name] is a 74 y.o. 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 She did not receive IV t-PA due to late presentation (>4.5 hours from time of onset) and resolution of deficits.  Brain and Skull Metastasis  CT head -  Negative CT head   MRI head - Small amount of hemorrhage, likely subacute, within the left occipital lobe. Areas of hyperintense T2-weighted signal within the left temporal lobe may also be due to prior left PCA ischemic events.   MRA head - Multifocal stenosis of the distal left PCA.  CTA H&N - No emergent large vessel occlusion or high-grade stenosis of the intracranial arteries.  MRI brain with contrast multiple intracranial parenchymal and leptomeningeal mets, 1.6cm R frontoparietal skull mets  LE venous Doppler negative for DVT  2D Echo EF 55 to 60%  [employer] Virus 2 - negative  LDL - 67  HgbA1c - 6.0  VTE prophylaxis - SCDs  aspirin 81 mg daily prior to admission, now resumed home aspirin 81 mg daily.  On steroids for brain mets per CCM  Therapy recommendations:  Outpt PT   Disposition:  Return home  Metastasic lung cancer  Pt reported intermittent coughing for the last 3 to 4 months  CTA neck showed innumerable nodular pulmonary metastases.   CT Chest Abd and Pelvis - 9/5 - Dominant mass in  the left lower lobe consistent with a primary bronchogenic carcinoma. This measures approximately 7.8 x 4.2 x 4.8 cm. Extensive metastatic disease to both lungs with numerous lung nodules throughout all lobes. 2.0 x 1.1 cm soft tissue mass versus a mildly enlarged lymph node in the anterior mediastinum anterior to the heart, possibly metastatic disease. Skeletal metastatic disease with lucent bone lesions, the most definitively seen within the right T5 transverse process, the sacrum, right ilium and left anterior acetabulum/pubis.  MRI brain with contrast multiple intracranial parenchymal and leptomeningeal mets, 1.6cm R frontoparietal skull mets  Oncology consulted  Bronch scheduled for today with pulmonary   Palliative consulted - pt has good attitude towards the diagnosis  Focal seizure  Episode of slurry speech 10-14 days ago  Episode of right facial numbness prior  to presentation  More frequent slurry speech episodes for the last 2-3 days  Again episode of right lip numbness 9/6 am  EEG pending  Loaded with keppra 1g followed by 500mg  bid  Hypertension  Home BP meds: Losartan ; metoprolol ; spironolactone  Current BP meds: Losartan ; metoprolol ; spironolactone  Stable . Long-term BP goal normotensive  Hyperlipidemia  Home Lipid lowering medication: Lipitor 10 mg daily  LDL 67, goal < 100 for non-stroke dx  Current lipid lowering medication: Lipitor 10 mg daily   Continue statin at discharge  Other Stroke Risk Factors  Advanced age  Previous ETOH use  Obesity, Body mass index is 31.24 kg/m., recommend weight loss, diet and exercise as appropriate   Family hx stroke (mother and father)   Other Active Problems  Code status - Full code   AKI, likely due to contrast - creatinine - 0.89->1.15->1.12  Hospital day # 3  Rosalin Hawking, MD PhD Stroke Neurology 01/12/2020 12:11 PM   To contact Stroke Continuity provider, please refer to [email or web address] After hours, contact General Neurology

## 2020-01-12 NOTE — Consult Note (Signed)
NAME:  Stefanie Camacho, MRN:  [id number], DOB:  August 05, 1945, LOS: 3 ADMISSION DATE:  01/08/2020, CONSULTATION DATE:  01/11/20 REFERRING MD:  Darrick Meigs, CHIEF COMPLAINT:  Facial numbness   Brief History   74 year old woman here for stroke workup found to have evidence of widespread cancer and question of lung origin.  History of present illness   74 year old nonsmoker with history noted below who presented with transient neurological complaints on 9/4 (self limited slurred speech, self-limited R facial numbbness, difficulty with balance).  Underwent usual workup for TIA and found to have widespread cancer to brain, skeleton, lung.  Pulmonology consulted for consideration of diagnostic bronchoscopy.   Patient has had a prior hysterectomy, no prior cancers, and mammograms up to date.  Denies hemoptysis, has mild cough for past month.  ~5 lb weight loss over past 3 months.  Past Medical History  OSA NICM LBBB HLD Anxiety  Significant Hospital Events   9/4 admitted  Consults:  Neurology, Oncology  Procedures:  N/A  Significant Diagnostic Tests:   MRI Brain IMPRESSION: 1. 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 2. 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  CT C/A/P IMPRESSION: 1. Dominant mass in the left lower lobe consistent with a primary bronchogenic carcinoma. This measures approximately 7.8 x 4.2 x 4.8 cm. 2. Extensive metastatic disease to both lungs with numerous lung nodules throughout all lobes. 2.0 x 1.1 cm soft tissue mass versus a mildly enlarged lymph node in the anterior mediastinum anterior to the heart, possibly metastatic disease. 3. Skeletal metastatic disease with lucent bone lesions, the most definitively seen within the right T5 transverse process, the sacrum, right ilium and left anterior acetabulum/pubis. 4. No other evidence of metastatic disease. 5. No acute findings. 6. 3 cm right liver lobe  hemangioma.  Echo 9/4 1. Left ventricular ejection fraction, by estimation, is 55 to 60%. The  left ventricle has normal function. The left ventricle has no regional  wall motion abnormalities. There is mild left ventricular hypertrophy.  Left ventricular diastolic parameters  are consistent with Grade I diastolic dysfunction (impaired relaxation).  2. Right ventricular systolic function is normal. The right ventricular  size is normal.  3. The mitral valve is grossly normal. Trivial mitral valve  regurgitation.  4. The aortic valve is grossly normal. Aortic valve regurgitation is not  visualized. No aortic stenosis is present.   Micro Data:  COVID neg  Antimicrobials:  N/A   Interim history/subjective:   Doing well this afternoon. A little nervous but ready to move forward. No barriers to proceed.   Objective   Blood pressure 105/60, pulse 62, temperature 98 F (36.7 C), temperature source Oral, resp. rate 20, height 5\' 6"  (1.676 m), weight 87.8 kg, SpO2 95 %.       No intake or output data in the 24 hours ending 01/12/20 0740 Filed Weights   01/08/20 1312 01/09/20 0041  Weight: 89.8 kg 87.8 kg    Examination: General appearance: 74 y.o., female, NAD, conversant  Eyes: anicteric sclerae, moist conjunctivae; no lid-lag; PERRLA, tracking appropriately HENT: NCAT; oropharynx,  Neck: Trachea midline; FROM, supple, lymphadenopathy, no JVD Lungs: CTAB, no crackles, no wheeze,  CV: RRR, S1, S2, no MRGs  Abdomen: Soft, non-tender; non-distended, BS present  Extremities: No peripheral edema, radial and DP pulses present bilaterally  Skin: Normal temperature, turgor and texture; no rash Psych: Appropriate affect Neuro: Alert and oriented to person and place, no focal deficit  Resolved Hospital Problem list   N/A  Assessment & Plan:   Metastatic Cancer To Brain, Lungs, Bone Stage 4 disease  LLL lung mass, possible primary  Plan: Discussed r/b/a of  procedure The patient is agreeable to proceed.  Bronchoscopy this afternoon to establish tissue diagnosis.   Labs   CBC: Recent Labs  Lab 01/08/20 1316 01/11/20 2012  WBC 8.1 8.4  NEUTROABS 4.8  --   HGB 14.6 13.2  HCT 45.3 41.8  MCV 87.8 88.6  PLT 262 379    Basic Metabolic Panel: Recent Labs  Lab 01/08/20 1316 01/10/20 0343 01/11/20 2012  NA 135 137 136  K 4.7 4.3 4.9  CL 101 102 101  CO2 26 26 28   GLUCOSE 103* 104* 112*  BUN 23 24* 23  CREATININE 0.89 1.15* 1.12*  CALCIUM 10.2 10.0 9.8   GFR: Estimated Creatinine Clearance: 50.7 mL/min (A) (by C-G formula based on SCr of 1.12 mg/dL (H)). Recent Labs  Lab 01/08/20 1316 01/11/20 2012  WBC 8.1 8.4    Liver Function Tests: Recent Labs  Lab 01/08/20 1316 01/10/20 0343 01/11/20 2012  AST 15 18 16   ALT 18 16 16   ALKPHOS 243* 223* 221*  BILITOT 0.7 1.0 0.6  PROT 7.8 6.5 6.7  ALBUMIN 3.9 3.3* 3.4*   No results for input(s): LIPASE, AMYLASE in the last 168 hours. No results for input(s): AMMONIA in the last 168 hours.  ABG No results found for: PHART, PCO2ART, PO2ART, HCO3, TCO2, ACIDBASEDEF, O2SAT   Coagulation Profile: Recent Labs  Lab 01/08/20 1316 01/11/20 2012  INR 1.0 1.0    Cardiac Enzymes: No results for input(s): CKTOTAL, CKMB, CKMBINDEX, TROPONINI in the last 168 hours.  HbA1C: Hgb A1c MFr Bld  Date/Time Value Ref Range Status  01/09/2020 04:16 AM 6.0 (H) 4.8 - 5.6 % Final    Comment:    (NOTE) Pre diabetes:          5.7%-6.4%  Diabetes:              >6.4%  Glycemic control for   <7.0% adults with diabetes     CBG: Recent Labs  Lab 01/08/20 1347  GLUCAP 96    Garner Nash, DO [facility] 01/12/2020 7:40 AM

## 2020-01-13 ENCOUNTER — Encounter: Payer: Self-pay | Admitting: *Deleted

## 2020-01-13 DIAGNOSIS — R918 Other nonspecific abnormal finding of lung field: Secondary | ICD-10-CM

## 2020-01-13 LAB — CYTOLOGY - NON PAP

## 2020-01-13 LAB — SURGICAL PATHOLOGY

## 2020-01-13 MED ORDER — SPIRONOLACTONE 25 MG PO TABS
12.5000 mg | ORAL_TABLET | Freq: Every day | ORAL | 3 refills | Status: DC
Start: 2020-01-13 — End: 2020-11-02

## 2020-01-13 MED ORDER — LEVETIRACETAM 500 MG PO TABS
500.0000 mg | ORAL_TABLET | Freq: Two times a day (BID) | ORAL | 1 refills | Status: DC
Start: 2020-01-13 — End: 2020-03-09

## 2020-01-13 MED ORDER — LOSARTAN POTASSIUM 50 MG PO TABS: 50.0000 mg | ORAL_TABLET | Freq: Every day | ORAL | 2 refills | Status: DC

## 2020-01-13 MED ORDER — PANTOPRAZOLE SODIUM 40 MG PO TBEC
40.0000 mg | DELAYED_RELEASE_TABLET | Freq: Every day | ORAL | 1 refills | Status: DC
Start: 2020-01-14 — End: 2020-03-04

## 2020-01-13 MED ORDER — METOPROLOL SUCCINATE ER 50 MG PO TB24
50.0000 mg | ORAL_TABLET | Freq: Every day | ORAL | 2 refills | Status: DC
Start: 2020-01-13 — End: 2020-07-28

## 2020-01-13 MED ORDER — ACETAMINOPHEN-CODEINE #3 300-30 MG PO TABS: 1.0000 | ORAL_TABLET | Freq: Three times a day (TID) | ORAL | 0 refills | Status: AC | PRN

## 2020-01-13 MED ORDER — ATORVASTATIN CALCIUM 10 MG PO TABS: 10.0000 mg | ORAL_TABLET | Freq: Every day | ORAL | 2 refills | Status: DC

## 2020-01-13 MED ORDER — FLUOXETINE HCL 10 MG PO TABS
10.0000 mg | ORAL_TABLET | Freq: Every day | ORAL | 3 refills | Status: DC
Start: 2020-01-13 — End: 2020-04-13

## 2020-01-13 MED ORDER — LEVOTHYROXINE SODIUM 75 MCG PO TABS: 75.0000 ug | ORAL_TABLET | Freq: Every day | ORAL | 1 refills | Status: AC

## 2020-01-13 NOTE — Progress Notes (Signed)
Pt. Discharged home in stable condition. Left via car with neighbor. AVS documentation reviewed. All questions answered. Pt.'s belongings sent with pt.

## 2020-01-13 NOTE — Progress Notes (Signed)
I received referral on [name] today.  I notified scheduling to call her and schedule her to be seen next week 01/18/20 with Dr. Julien Nordmann. I will also notify Shona Simpson rad onc PA to see if they need to see patient regarding her bone mets.

## 2020-01-13 NOTE — Progress Notes (Signed)
error 

## 2020-01-13 NOTE — TOC Transition Note (Signed)
Transition of Care [facility]) - CM/SW Discharge Note   Patient Details  Name: Stefanie Camacho MRN: [id number] Date of Birth: Sep 25, 1945  [facility] CM/SW Contact:  Joanne Chars, LCSW Phone Number: 01/13/2020, 12:29 PM   Clinical Narrative:   Pt Outpt PT/OT arranged through neuro rehab.  Confirmed with pt that she has ride home. No other needs identified.      Final next level of care: Home/Self Care Barriers to Discharge: Barriers Resolved   Patient Goals and CMS Choice Patient states their goals for this hospitalization and ongoing recovery are:: "I don't want to hurt"      Discharge Placement                       Discharge Plan and Services     Post Acute Care Choice:  (outpt PT/OT)          DME Arranged: N/A DME Agency: NA         HH Agency: NA        Social Determinants of Health (SDOH) Interventions     Readmission Risk Interventions No flowsheet data found.

## 2020-01-13 NOTE — Progress Notes (Signed)
Physical Therapy Treatment Patient Details Name: Stefanie Camacho MRN: [id number] DOB: 1945-06-30 Today's Date: 01/13/2020    History of Present Illness 74 year old female with a small hemorrhagic lesion in the left occipital lobe. Presented with transient episode of numbness.  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 Additional mets to brain. s/p bronchoscopy and LLL biopsy on 9/7.    PT Comments    Pt progressing with gait distance, with less antalgic gait noticed this day. Pt with SpO2 desaturation on RA to 85% with mild dyspnea noted, requiring 2LO2 to recover sats. PT reviewed HEP with pt verbally to promote LE strengthening and improved activity tolerance. Pt eager to d/c home, will continue to follow while acute.  SATURATION QUALIFICATIONS: (This note is used to comply with regulatory documentation for home oxygen)  Patient Saturations on Room Air at Rest = 90%  Patient Saturations on Room Air while Ambulating = 85%  Patient Saturations on 2 Liters of oxygen while Ambulating = 90%  Please briefly explain why patient needs home oxygen: to maintain SpO2 >88%   Follow Up Recommendations  Outpatient PT     Equipment Recommendations  None recommended by PT    Recommendations for Other Services       Precautions / Restrictions Precautions Precautions: Fall Precaution Comments: new diagonsis of lung ca with mets; watch sats Restrictions Weight Bearing Restrictions: No    Mobility  Bed Mobility               General bed mobility comments: up in recliner  Transfers Overall transfer level: Needs assistance Equipment used: Rolling walker (2 wheeled) Transfers:  Sit to/from Stand Sit to Stand: Supervision         General transfer comment: for safety  Ambulation/Gait Ambulation/Gait assistance: Supervision Gait Distance (Feet): 400 Feet Assistive device: Rolling walker (2 wheeled) Gait Pattern/deviations: Step-through pattern;Decreased stride length;Trunk flexed;Decreased stance time - right Gait velocity: decr   General Gait Details: supervision for safety, decreased antalgic gait today vs yesterday. SpO2 85% on RA during gait, requiring 2LO2 to maintain SpO2 90% and greater.   Stairs             Wheelchair Mobility    Modified Rankin (Stroke Patients Only) Modified Rankin (Stroke Patients Only) Pre-Morbid Rankin Score: No symptoms Modified Rankin: No significant disability     Balance Overall balance assessment: Needs assistance   Sitting balance-Leahy Scale: Good       Standing balance-Leahy Scale: Fair               High level balance activites: Backward walking;Direction changes;Turns;Head turns;Other (comment);Sudden stops High Level Balance Comments: Horizontal/vertical head turns, sudden stops, backwards walking WFL. Increased time to perform step over object, 180* directional change            Cognition Arousal/Alertness: Awake/alert Behavior During Therapy: WFL for tasks assessed/performed Overall Cognitive Status: Within Functional Limits for tasks assessed                                 General Comments: Pt reports word-finding difficulties, a few instances noticable during session      Exercises Other Exercises Other Exercises: Reviewed recommended HEP: sit to  stands with external support (RW/kitchen counter) vs mini-squats at [employer], up and walking with RW vs rollator for longer distances or while doing ADLs (dishes, laundry)    General Comments        Pertinent Vitals/Pain Pain Assessment: Faces Faces Pain Scale: Hurts a little bit Pain Location: R hip Pain  Descriptors / Indicators: Discomfort;Aching;Guarding Pain Intervention(s): Limited activity within patient's tolerance;Monitored during session;Repositioned    Home Living                      Prior Function            PT Goals (current goals can now be found in the care plan section) Acute Rehab PT Goals Patient Stated Goal: to "get my ducks in a row" PT Goal Formulation: With patient Time For Goal Achievement: 01/16/20 Potential to Achieve Goals: Good Progress towards PT goals: Progressing toward goals    Frequency    Min 3X/week      PT Plan Current plan remains appropriate    Co-evaluation              AM-PAC PT "6 Clicks" Mobility   Outcome Measure  Help needed turning from your back to your side while in a flat bed without using bedrails?: None Help needed moving from lying on your back to sitting on the side of a flat bed without using bedrails?: None Help needed moving to and from a bed to a chair (including a wheelchair)?: None Help needed standing up from a chair using your arms (e.g., wheelchair or bedside chair)?: None Help needed to walk in hospital room?: None Help needed climbing 3-5 steps with a railing? : A Little 6 Click Score: 23    End of Session   Activity Tolerance: Patient tolerated treatment well Patient left: in bed;with call bell/phone within reach (transport chair with transport personnel to go down for MRI) Nurse Communication: Mobility status PT Visit Diagnosis: Other abnormalities of gait and mobility (R26.89);Muscle weakness (generalized) (M62.81);Other symptoms and signs involving the nervous system (R29.898)     Time: 1443-1540 PT Time Calculation (min) (ACUTE ONLY): 25 min  Charges:  $Gait Training: 8-22 mins                     Carmella Kees E, PT [place] Pager [phone]  Office [phone]   Nevena Rozenberg D Daiveon Markman 01/13/2020, 11:09 AM

## 2020-01-13 NOTE — Progress Notes (Signed)
STROKE TEAM PROGRESS NOTE   INTERVAL HISTORY No family at the bedside. Pt sitting in chair. She had broncho biopsy yesterday with Dr. Valeta Harms. Scheduled to see Dr. Inda Merlin on 9/13. She stated that for the last 2-3 days she has some tension HA. No more episode of right facial numbness.      OBJECTIVE Vitals:   01/12/20 1503 01/12/20 1656 01/12/20 2000 01/13/20 0742  BP: 127/61 (!) 124/52 (!) 135/57 (!) 143/66  Pulse: 80 77 80 63  Resp: 13 16 20 18   Temp:  98.2 F (36.8 C) 97.7 F (36.5 C) 98.3 F (36.8 C)  TempSrc:   Oral   SpO2: 95% 99% 97% 94%  Weight:      Height:       CBC:  Recent Labs  Lab 01/08/20 1316 01/11/20 2012  WBC 8.1 8.4  NEUTROABS 4.8  --   HGB 14.6 13.2  HCT 45.3 41.8  MCV 87.8 88.6  PLT 262 779   Basic Metabolic Panel:  Recent Labs  Lab 01/10/20 0343 01/11/20 2012  NA 137 136  K 4.3 4.9  CL 102 101  CO2 26 28  GLUCOSE 104* 112*  BUN 24* 23  CREATININE 1.15* 1.12*  CALCIUM 10.0 9.8   Lipid Panel:     Component Value Date/Time   CHOL 127 01/09/2020 0416   CHOL 133 07/17/2018 0933   TRIG 112 01/09/2020 0416   HDL 38 (L) 01/09/2020 0416   HDL 40 07/17/2018 0933   CHOLHDL 3.3 01/09/2020 0416   VLDL 22 01/09/2020 0416   LDLCALC 67 01/09/2020 0416   LDLCALC 67 07/17/2018 0933   HgbA1c:  Lab Results  Component Value Date   HGBA1C 6.0 (H) 01/09/2020   Urine Drug Screen: No results found for: LABOPIA, COCAINSCRNUR, LABBENZ, AMPHETMU, THCU, LABBARB  Alcohol Level No results found for: [facility]  IMAGING  CT HEAD WO CONTRAST 01/08/2020 IMPRESSION:  Negative CT head   CT ANGIO HEAD W OR WO CONTRAST CT ANGIO NECK W OR WO CONTRAST 01/09/2020 IMPRESSION:  1. No emergent large vessel occlusion or high-grade stenosis of the intracranial arteries.  2. Innumerable nodular pulmonary metastases.   MR BRAIN WO CONTRAST MR ANGIO HEAD WO CONTRAST 01/09/2020 IMPRESSION:  1. Small amount of hemorrhage, likely subacute, within the left occipital lobe.  Given the multifocal left PCA stenosis, this probably represents hemorrhage associated with a recent infarct.  2. Areas of hyperintense T2-weighted signal within the left temporal lobe may also be due to prior left PCA ischemic events.  3. Multifocal stenosis of the distal left PCA. Otherwise normal intracranial MRA.   CT CHEST ABDOMEN AND PELVIS W CONTRAST 01/10/20 IMPRESSION: 1. Dominant mass in the left lower lobe consistent with a primary bronchogenic carcinoma. This measures approximately 7.8 x 4.2 x 4.8 cm. 2. Extensive metastatic disease to both lungs with numerous lung nodules throughout all lobes. 2.0 x 1.1 cm soft tissue mass versus a mildly enlarged lymph node in the anterior mediastinum anterior to the heart, possibly metastatic disease. 3. Skeletal metastatic disease with lucent bone lesions, the most definitively seen within the right T5 transverse process, the sacrum, right ilium and left anterior acetabulum/pubis. 4. No other evidence of metastatic disease. 5. No acute findings. 6. 3 cm right liver lobe hemangioma.  MRI HEAD WITH CONTRAST 01/10/20 1. 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 2. 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  Transthoracic Echocardiogram  1. Left ventricular ejection fraction, by estimation, is 55  to 60%. The left ventricle has normal function. The left ventricle has no regional wall motion abnormalities. There is mild left ventricular hypertrophy. Left ventricular diastolic parameters are consistent with Grade I diastolic dysfunction (impaired relaxation).  2. Right ventricular systolic function is normal. The right ventricular size is normal.  3. The mitral valve is grossly normal. Trivial mitral valve regurgitation.  4. The aortic valve is grossly normal. Aortic valve regurgitation is not visualized. No aortic stenosis is present.   ECG - SR rate 69 BPM. Probable old anterior  infarct. (See cardiology reading for complete details)  PHYSICAL EXAM    Temp:  [97.2 F (36.2 C)-98.3 F (36.8 C)] 98.3 F (36.8 C) (09/08 0742) Pulse Rate:  [63-85] 63 (09/08 0742) Resp:  [13-20] 18 (09/08 0742) BP: (123-143)/(52-66) 143/66 (09/08 0742) SpO2:  [92 %-99 %] 94 % (09/08 0742) Weight:  [87.8 kg] 87.8 kg (09/07 1243)  General - Well nourished, well developed, in no apparent distress.  Ophthalmologic - fundi not visualized due to noncooperation.  Cardiovascular - Regular rhythm and rate.  Mental Status -  Level of arousal and orientation to time, place, and person were intact. Language including expression, naming, repetition, comprehension was assessed and found intact. Fund of Knowledge was assessed and was intact.  Cranial Nerves II - XII - II - Visual field intact OU. III, IV, VI - Extraocular movements intact. V - Facial sensation intact bilaterally. VII - Facial movement intact bilaterally. VIII - Hearing & vestibular intact bilaterally. X - Palate elevates symmetrically. XI - Chin turning & shoulder shrug intact bilaterally. XII - Tongue protrusion intact.  Motor Strength - The patient's strength was normal in all extremities and pronator drift was absent.  Bulk was normal and fasciculations were absent.   Motor Tone - Muscle tone was assessed at the neck and appendages and was normal.  Reflexes - The patient's reflexes were symmetrical in all extremities and she had no pathological reflexes.  Sensory - Light touch, temperature/pinprick were assessed and were symmetrical.    Coordination - The patient had normal movements in the hands and feet with no ataxia or dysmetria.  Tremor was absent.  Gait and Station - deferred.   ASSESSMENT/PLAN [name] is a 74 y.o. 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 She did not receive IV t-PA due to late presentation (>4.5  hours from time of onset) and resolution of deficits.  Brain and Skull Metastasis  CT head - Negative CT head   MRI head - Small amount of hemorrhage, likely subacute, within the left occipital lobe. Areas of hyperintense T2-weighted signal within the left temporal lobe may also be due to prior left PCA ischemic events.   MRA head - Multifocal stenosis of the distal left PCA.  CTA H&N - No emergent large vessel occlusion or high-grade stenosis of the intracranial arteries.  MRI brain with contrast multiple intracranial parenchymal and leptomeningeal mets, 1.6cm R frontoparietal skull mets  LE venous Doppler negative for DVT  2D Echo EF 55 to 60%  [employer] Virus 2 - negative  LDL - 67  HgbA1c - 6.0  VTE prophylaxis - SCDs  aspirin 81 mg daily prior to admission, now resumed home aspirin 81 mg daily.  Therapy recommendations:  Outpt PT   Disposition:  Return home  Metastasic lung cancer  Pt reported intermittent coughing for the last 3 to 4 months  CTA neck showed innumerable nodular  pulmonary metastases.   CT Chest Abd and Pelvis - 9/5 - Dominant mass in the left lower lobe consistent with a primary bronchogenic carcinoma. This measures approximately 7.8 x 4.2 x 4.8 cm. Extensive metastatic disease to both lungs with numerous lung nodules throughout all lobes. 2.0 x 1.1 cm soft tissue mass versus a mildly enlarged lymph node in the anterior mediastinum anterior to the heart, possibly metastatic disease. Skeletal metastatic disease with lucent bone lesions, the most definitively seen within the right T5 transverse process, the sacrum, right ilium and left anterior acetabulum/pubis.  MRI brain with contrast multiple intracranial parenchymal and leptomeningeal mets, 1.6cm R frontoparietal skull mets  Oncology Dr. Inda Merlin to see on 9/13  Bronch biopsy done with Dr. Valeta Harms, result pending   Palliative consulted - pt has good attitude towards the diagnosis  Focal  seizure  Episode of slurry speech 10-14 days ago  Episode of right facial numbness prior to presentation  More frequent slurry speech episodes for the last 2-3 days  Again episode of right lip numbness 9/6 am  EEG normal  Continue keppra 500mg  bid on discharge  Hypertension  Home BP meds: Losartan ; metoprolol ; spironolactone  Current BP meds: Losartan ; metoprolol ; spironolactone  Stable . Long-term BP goal normotensive  Hyperlipidemia  Home Lipid lowering medication: Lipitor 10 mg daily  LDL 67, goal < 100 for non-stroke dx  Current lipid lowering medication: Lipitor 10 mg daily   Continue home statin at discharge  Other Stroke Risk Factors  Advanced age  Previous ETOH use  Obesity, Body mass index is 31.24 kg/m., recommend weight loss, diet and exercise as appropriate   Family hx stroke (mother and father)   Other Active Problems  Code status - Full code   AKI, likely due to contrast - creatinine - 0.89->1.15->1.12  Hospital day # 4  Neurology will sign off. Please call with questions. Pt will follow up with stroke clinic NP at [facility] in about 4 weeks. Thanks for the consult.   Rosalin Hawking, MD PhD Stroke Neurology 01/13/2020 12:09 PM   To contact Stroke Continuity provider, please refer to [email or web address] After hours, contact General Neurology

## 2020-01-14 ENCOUNTER — Other Ambulatory Visit: Payer: Self-pay

## 2020-01-14 ENCOUNTER — Ambulatory Visit
Admission: RE | Admit: 2020-01-14 | Discharge: 2020-01-14 | Disposition: A | Discharge status: Home / Self Care | Payer: Medicare HMO | Source: Ambulatory Visit | Attending: Radiation Oncology | Admitting: Radiation Oncology

## 2020-01-14 ENCOUNTER — Ambulatory Visit: Payer: Medicare HMO | Admitting: Radiation Oncology

## 2020-01-14 ENCOUNTER — Encounter (HOSPITAL_COMMUNITY): Payer: Self-pay | Admitting: Pulmonary Disease

## 2020-01-14 ENCOUNTER — Ambulatory Visit: Payer: Medicare HMO

## 2020-01-14 VITALS — BP 112/63 | HR 70 | Temp 97.8°F | Resp 20 | Ht 66.0 in | Wt 195.6 lb

## 2020-01-14 DIAGNOSIS — Z79899 Other long term (current) drug therapy: Secondary | ICD-10-CM | POA: Diagnosis not present

## 2020-01-14 DIAGNOSIS — Z510 Encounter for antineoplastic radiation therapy: Secondary | ICD-10-CM | POA: Diagnosis present

## 2020-01-14 DIAGNOSIS — C7800 Secondary malignant neoplasm of unspecified lung: Secondary | ICD-10-CM | POA: Insufficient documentation

## 2020-01-14 DIAGNOSIS — C3432 Malignant neoplasm of lower lobe, left bronchus or lung: Secondary | ICD-10-CM

## 2020-01-14 DIAGNOSIS — C7931 Secondary malignant neoplasm of brain: Secondary | ICD-10-CM | POA: Diagnosis not present

## 2020-01-14 DIAGNOSIS — F419 Anxiety disorder, unspecified: Secondary | ICD-10-CM | POA: Insufficient documentation

## 2020-01-14 DIAGNOSIS — R69 Illness, unspecified: Secondary | ICD-10-CM | POA: Diagnosis not present

## 2020-01-14 DIAGNOSIS — C7949 Secondary malignant neoplasm of other parts of nervous system: Secondary | ICD-10-CM

## 2020-01-14 DIAGNOSIS — I10 Essential (primary) hypertension: Secondary | ICD-10-CM | POA: Insufficient documentation

## 2020-01-14 DIAGNOSIS — I428 Other cardiomyopathies: Secondary | ICD-10-CM | POA: Insufficient documentation

## 2020-01-14 DIAGNOSIS — K219 Gastro-esophageal reflux disease without esophagitis: Secondary | ICD-10-CM | POA: Diagnosis not present

## 2020-01-14 DIAGNOSIS — C7951 Secondary malignant neoplasm of bone: Secondary | ICD-10-CM | POA: Insufficient documentation

## 2020-01-14 DIAGNOSIS — G4730 Sleep apnea, unspecified: Secondary | ICD-10-CM | POA: Diagnosis not present

## 2020-01-14 DIAGNOSIS — E785 Hyperlipidemia, unspecified: Secondary | ICD-10-CM | POA: Diagnosis not present

## 2020-01-14 DIAGNOSIS — Z7982 Long term (current) use of aspirin: Secondary | ICD-10-CM | POA: Insufficient documentation

## 2020-01-14 DIAGNOSIS — E039 Hypothyroidism, unspecified: Secondary | ICD-10-CM | POA: Diagnosis not present

## 2020-01-14 HISTORY — DX: Malignant neoplasm of lower lobe, left bronchus or lung: C34.32

## 2020-01-14 NOTE — Progress Notes (Signed)
Radiation Oncology         [phone] ________________________________  Name: Stefanie Camacho        MRN: [id number]  Date of Service: 01/14/2020 DOB: 02-14-1946  NG:EXBMW, Hal Hope, MD  Curt Bears, MD     REFERRING PHYSICIAN: Curt Bears, MD   DIAGNOSIS: There were no encounter diagnoses.   HISTORY OF PRESENT ILLNESS: Stefanie Camacho is a 74 y.o. female seen at the request of Dr. Julien Nordmann for a newly diagnosed Stage IV lung cancer. The patient had experienced an episode of slurred speech while on the phone about a week and a half ago, she presented to the emergency department several days later after telling a friend about her symptoms, apparently the episode lasted for about 15 minutes without persistence, but a few days before presenting to the emergency room complaint of right facial numbness that lasted a few seconds and resolved.  She was seen in the emergency department on 01/08/2020, notes from the hospital stated state that she had been experiencing some dull frontal headache for the last few weeks as well.  A CT head was unremarkable but an MRI of the brain and MRA of the brain showed a possible hemorrhagic conversion of a stroke with left PCA stenosis, there was also concern within the left occipital lobe, and further work-up including CTs of the chest, neck abdomen and pelvis, and MRI of the brain showed concerns for stage IV lung cancer.  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  The largest lesion in the left temporal lobe consistent with metastatic disease in the parenchyma measuring 1.4 cm, there was minimal edema with this lesion as well as with a slightly smaller lesion posterior to the left of the temporo-occipital junction there were also multiple areas of abnormal leptomeningeal enhancement  bilaterally greatest in the left occipital region corresponding to the site for where she had had prior hemorrhage.  CT of the chest abdomen pelvis revealed a dominant mass in the left lower lobe consistent with a primary lung mass measuring 7.8 x 4.2 x 4.8 cm, extensive metastatic disease with numerous known lung nodules were seen throughout all lobes of the lung as well as an anterior mediastinal mass measuring 2 x 1.1 cm, metastatic disease within the skeletal system particularly at the T5 transverse process sacrum, right ilium and left anterior acetabulum/pubis were seen, she underwent bronchoscopy on set 01/12/2020 her tumor was biopsied and consistent with an adenocarcinoma, cytology with lavage showed atypia, and brushings also of the left lower lobe mass were consistent with malignancy.  She is seen today to discuss options of palliative treatment.  She is scheduled to see Dr. Julien Nordmann on Monday.     PREVIOUS RADIATION THERAPY: No   PAST MEDICAL HISTORY:  Past Medical History:  Diagnosis Date  . Anxiety   . Dyslipidemia   . Essential hypertension   . GERD (gastroesophageal reflux disease)   . Hypothyroidism   . Left bundle branch block 06/04/2018  . NICM (nonischemic cardiomyopathy) ([place])   . Nonischemic cardiomyopathy ([place]) 06/04/2018   Ejection fraction 45% in summer 2019  . Sleep apnea        PAST SURGICAL HISTORY: Past Surgical History:  Procedure Laterality Date  . ABDOMINAL HYSTERECTOMY    . BLADDER NECK RECONSTRUCTION    . BRONCHIAL BIOPSY  01/12/2020   Procedure: BRONCHIAL BIOPSIES;  Surgeon: Garner Nash, DO;  Location: [facility];  Service: Pulmonary;;  . BRONCHIAL BRUSHINGS  01/12/2020   Procedure: BRONCHIAL BRUSHINGS;  Surgeon: Garner Nash, DO;  Location: [facility];  Service: Pulmonary;;  . BRONCHIAL NEEDLE ASPIRATION BIOPSY  01/12/2020   Procedure: BRONCHIAL NEEDLE ASPIRATION BIOPSIES;  Surgeon: Garner Nash, DO;  Location: [facility];  Service:  Pulmonary;;  . BRONCHIAL WASHINGS  01/12/2020   Procedure: BRONCHIAL WASHINGS;  Surgeon: Garner Nash, DO;  Location: [facility];  Service: Pulmonary;;  . SHOULDER SURGERY    . TONSILLECTOMY    . VIDEO BRONCHOSCOPY WITH ENDOBRONCHIAL ULTRASOUND  01/12/2020   Procedure: VIDEO BRONCHOSCOPY WITH ENDOBRONCHIAL ULTRASOUND;  Surgeon: Garner Nash, DO;  Location: [facility];  Service: Pulmonary;;     FAMILY HISTORY:  Family History  Problem Relation Age of Onset  . Heart disease Mother   . Stroke Mother   . Heart disease Father   . Stroke Father   . Heart disease Maternal Grandfather   . Heart disease Paternal Grandfather      SOCIAL HISTORY:  reports that she has never smoked. She has never used smokeless tobacco. She reports previous alcohol use. She reports that she does not use drugs.  The patient is divorced and lives in [place].  She has four adult children, and is very active in her grandchildren's lives.  She often babysits her 63-year-old granddaughter.   ALLERGIES: Patient has no known allergies.   MEDICATIONS:  Current Outpatient Medications  Medication Sig Dispense Refill  . acetaminophen-codeine (TYLENOL #3) 300-30 MG tablet Take 1-2 tablets by mouth every 8 (eight) hours as needed for up to 3 days for moderate pain. 15 tablet 0  . aspirin EC 81 MG tablet Take 81 mg by mouth daily.    Marland Kitchen atorvastatin (LIPITOR) 10 MG tablet Take 1 tablet (10 mg total) by mouth daily at 6 PM. 30 tablet 2  . FLUoxetine (PROZAC) 10 MG tablet Take 1 tablet (10 mg total) by mouth daily. 30 tablet 3  . levETIRAcetam (KEPPRA) 500 MG tablet Take 1 tablet (500 mg total) by mouth 2 (two) times daily. 60 tablet 1  . levothyroxine (SYNTHROID) 75 MCG tablet Take 1 tablet (75 mcg total) by mouth daily. 30 tablet 1  . losartan (COZAAR) 50 MG tablet Take 1 tablet (50 mg total) by mouth daily. 90 tablet 2  . metoprolol succinate (TOPROL-XL) 50 MG 24 hr tablet Take 1 tablet (50 mg total) by mouth  daily. Take with or immediately following a meal. 90 tablet 2  . pantoprazole (PROTONIX) 40 MG tablet Take 1 tablet (40 mg total) by mouth daily. 30 tablet 1  . spironolactone (ALDACTONE) 25 MG tablet Take 0.5 tablets (12.5 mg total) by mouth daily. 45 tablet 3   No current facility-administered medications for this encounter.     REVIEW OF SYSTEMS: On review of systems, the patient reports that her most symptomatic areas are her right sacroiliac region and in retrospect she states that she has been seeking out care for low back pain for several months, including evaluation with orthopedics and chiropractic medicine.  She also has symptoms of pain in her right hip region specifically into the groin, sometimes this is also present in the right groin.  The symptoms have been going on off and on for the last 3 weeks.  She also describes for the last several weeks she has had a mild headache that comes and goes.  She denies any nausea  or vomiting, she denies unintentional or uncontrolled muscle movements.  She feels weak and unsteady however on her feet.  She has been using a walker at times to get around.  She also admits to some confusion and fogginess at times as well as the episode of slurred speech and word finding difficulty.  Despite some of the symptoms she has been able to remain fully functional in conversation and is keenly aware of her condition.  She states that she and her family are planning a meeting this weekend which she intends to tell of her children all about what is going on with her.  She is worried for some of her grandchildren because of another grandparent who was recently lost to a similar lung cancer diagnosis.  She reports that she has had a few pounds of weight loss perhaps less than 5 pounds in the last few months, she denies any progressive symptoms of shortness of breath but at times has felt somewhat winded when she is walking her dog which is somewhat new for her in the last  year or so however she attributed this to deconditioning from not being as physically active because of the Covid pandemic.  She denies fevers or chills, chest pain, abdominal pain or difficulty with bowel or bladder activity.  No other new orthopedic complaints are noted.  A complete review of systems is obtained and is otherwise negative.     PHYSICAL EXAM:  Wt Readings from Last 3 Encounters:  01/14/20 195 lb 9.6 oz (88.7 kg)  01/12/20 193 lb 9 oz (87.8 kg)  12/30/19 198 lb (89.8 kg)   Temp Readings from Last 3 Encounters:  01/14/20 97.8 F (36.6 C)  01/13/20 98.3 F (36.8 C)  11/13/19 97.8 F (36.6 C)   BP Readings from Last 3 Encounters:  01/14/20 112/63  01/13/20 (!) 143/66  12/30/19 125/70   Pulse Readings from Last 3 Encounters:  01/14/20 70  01/13/20 63  12/30/19 62   Pain Assessment Pain Score: 6  Pain Loc: Back (Back, right hip, right leg)/10  In general this is a well appearing Caucasian female in no acute distress.  She's alert and oriented x4 and appropriate throughout the examination. Cardiopulmonary assessment is negative for acute distress and she exhibits normal effort.    ECOG = 1  0 - Asymptomatic (Fully active, able to carry on all predisease activities without restriction)  1 - Symptomatic but completely ambulatory (Restricted in physically strenuous activity but ambulatory and able to carry out work of a light or sedentary nature. For example, light housework, office work)  2 - Symptomatic, <50% in bed during the day (Ambulatory and capable of all self care but unable to carry out any work activities. Up and about more than 50% of waking hours)  3 - Symptomatic, >50% in bed, but not bedbound (Capable of only limited self-care, confined to bed or chair 50% or more of waking hours)  4 - Bedbound (Completely disabled. Cannot carry on any self-care. Totally confined to bed or chair)  5 - Death   Eustace Pen MM, Creech RH, Tormey DC, et al. [phone]).  "Toxicity and response criteria of the [facility]". [place] Oncol. 5 (6): 649-55    LABORATORY DATA:  Lab Results  Component Value Date   WBC 8.4 01/11/2020   HGB 13.2 01/11/2020   HCT 41.8 01/11/2020   MCV 88.6 01/11/2020   PLT 251 01/11/2020   Lab Results  Component Value Date  NA 136 01/11/2020   K 4.9 01/11/2020   CL 101 01/11/2020   CO2 28 01/11/2020   Lab Results  Component Value Date   ALT 16 01/11/2020   AST 16 01/11/2020   ALKPHOS 221 (H) 01/11/2020   BILITOT 0.6 01/11/2020      RADIOGRAPHY: CT ANGIO HEAD W OR WO CONTRAST  Result Date: 01/09/2020 CLINICAL DATA:  Stroke EXAM: CT ANGIOGRAPHY HEAD AND NECK TECHNIQUE: Multidetector CT imaging of the head and neck was performed using the standard protocol during bolus administration of intravenous contrast. Multiplanar CT image reconstructions and MIPs were obtained to evaluate the vascular anatomy. Carotid stenosis measurements (when applicable) are obtained utilizing NASCET criteria, using the distal internal carotid diameter as the denominator. CONTRAST:  5mL OMNIPAQUE IOHEXOL 350 MG/ML SOLN COMPARISON:  None. FINDINGS: CTA NECK FINDINGS SKELETON: There is no bony spinal canal stenosis. No lytic or blastic lesion. OTHER NECK: Normal pharynx, larynx and major salivary glands. No cervical lymphadenopathy. Unremarkable thyroid gland. UPPER CHEST: Innumerable nodules throughout both lungs. AORTIC ARCH: There is no calcific atherosclerosis of the aortic arch. There is no aneurysm, dissection or hemodynamically significant stenosis of the visualized portion of the aorta. Normal variant aortic arch branching pattern with the brachiocephalic and left common carotid arteries sharing a common origin. The visualized proximal subclavian arteries are widely patent. RIGHT CAROTID SYSTEM: Normal without aneurysm, dissection or stenosis. LEFT CAROTID SYSTEM: Normal without aneurysm, dissection or stenosis.  VERTEBRAL ARTERIES: Left dominant configuration. Both origins are clearly patent. There is no dissection, occlusion or flow-limiting stenosis to the skull base (V1-V3 segments). CTA HEAD FINDINGS POSTERIOR CIRCULATION: --Vertebral arteries: Normal V4 segments. --Inferior cerebellar arteries: Normal. --Basilar artery: Normal. --Superior cerebellar arteries: Normal. --Posterior cerebral arteries (PCA): Normal. ANTERIOR CIRCULATION: --Intracranial internal carotid arteries: Normal. --Anterior cerebral arteries (ACA): Normal. Both A1 segments are present. Patent anterior communicating artery (a-comm). --Middle cerebral arteries (MCA): Normal. VENOUS SINUSES: As permitted by contrast timing, patent. ANATOMIC VARIANTS: None Review of the MIP images confirms the above findings. IMPRESSION: 1. No emergent large vessel occlusion or high-grade stenosis of the intracranial arteries. 2. Innumerable nodular pulmonary metastases. Electronically Signed   By: Ulyses Jarred M.D.   On: 01/09/2020 05:17   CT HEAD WO CONTRAST  Result Date: 01/08/2020 CLINICAL DATA:  TIA.  Right facial numbness 2 days ago. EXAM: CT HEAD WITHOUT CONTRAST TECHNIQUE: Contiguous axial images were obtained from the base of the skull through the vertex without intravenous contrast. COMPARISON:  None. FINDINGS: Brain: No evidence of acute infarction, hemorrhage, hydrocephalus, extra-axial collection or mass lesion/mass effect. Vascular: Negative for hyperdense vessel Skull: Negative Sinuses/Orbits: Paranasal sinuses clear. Bilateral cataract extraction. No orbital lesion. Other: None IMPRESSION: Negative CT head Electronically Signed   By: Franchot Gallo M.D.   On: 01/08/2020 13:50   CT ANGIO NECK W OR WO CONTRAST  Result Date: 01/09/2020 CLINICAL DATA:  Stroke EXAM: CT ANGIOGRAPHY HEAD AND NECK TECHNIQUE: Multidetector CT imaging of the head and neck was performed using the standard protocol during bolus administration of intravenous contrast.  Multiplanar CT image reconstructions and MIPs were obtained to evaluate the vascular anatomy. Carotid stenosis measurements (when applicable) are obtained utilizing NASCET criteria, using the distal internal carotid diameter as the denominator. CONTRAST:  7mL OMNIPAQUE IOHEXOL 350 MG/ML SOLN COMPARISON:  None. FINDINGS: CTA NECK FINDINGS SKELETON: There is no bony spinal canal stenosis. No lytic or blastic lesion. OTHER NECK: Normal pharynx, larynx and major salivary glands. No cervical lymphadenopathy. Unremarkable thyroid gland. UPPER CHEST:  Innumerable nodules throughout both lungs. AORTIC ARCH: There is no calcific atherosclerosis of the aortic arch. There is no aneurysm, dissection or hemodynamically significant stenosis of the visualized portion of the aorta. Normal variant aortic arch branching pattern with the brachiocephalic and left common carotid arteries sharing a common origin. The visualized proximal subclavian arteries are widely patent. RIGHT CAROTID SYSTEM: Normal without aneurysm, dissection or stenosis. LEFT CAROTID SYSTEM: Normal without aneurysm, dissection or stenosis. VERTEBRAL ARTERIES: Left dominant configuration. Both origins are clearly patent. There is no dissection, occlusion or flow-limiting stenosis to the skull base (V1-V3 segments). CTA HEAD FINDINGS POSTERIOR CIRCULATION: --Vertebral arteries: Normal V4 segments. --Inferior cerebellar arteries: Normal. --Basilar artery: Normal. --Superior cerebellar arteries: Normal. --Posterior cerebral arteries (PCA): Normal. ANTERIOR CIRCULATION: --Intracranial internal carotid arteries: Normal. --Anterior cerebral arteries (ACA): Normal. Both A1 segments are present. Patent anterior communicating artery (a-comm). --Middle cerebral arteries (MCA): Normal. VENOUS SINUSES: As permitted by contrast timing, patent. ANATOMIC VARIANTS: None Review of the MIP images confirms the above findings. IMPRESSION: 1. No emergent large vessel occlusion or  high-grade stenosis of the intracranial arteries. 2. Innumerable nodular pulmonary metastases. Electronically Signed   By: Ulyses Jarred M.D.   On: 01/09/2020 05:17   MR ANGIO HEAD WO CONTRAST  Result Date: 01/09/2020 CLINICAL DATA:  Acute neurologic deficit. Slurred speech and facial numbness EXAM: MRI HEAD WITHOUT CONTRAST MRA HEAD WITHOUT CONTRAST TECHNIQUE: Multiplanar, multiecho pulse sequences of the brain and surrounding structures were obtained without intravenous contrast. Angiographic images of the head were obtained using MRA technique without contrast. COMPARISON:  None. FINDINGS: MRI HEAD FINDINGS Brain: There is a small amount of hemorrhage, likely subacute, within the left occipital lobe. It is unclear whether this is subarachnoid or intraparenchymal within the cortex. There are areas of hyperintense T2-weighted signal within the left temporal lobe. Normal volume of CSF spaces. No chronic microhemorrhage. Normal midline structures. Vascular: Normal flow voids. Skull and upper cervical spine: Normal marrow signal. Sinuses/Orbits: Negative. Other: None. MRA HEAD FINDINGS POSTERIOR CIRCULATION: --Vertebral arteries: Normal V4 segments. --Inferior cerebellar arteries: Normal. --Basilar artery: Normal. --Superior cerebellar arteries: Normal. --Posterior cerebral arteries: Multifocal stenosis of the distal left PCA. Normal right PCA. ANTERIOR CIRCULATION: --Intracranial internal carotid arteries: Normal. --Anterior cerebral arteries (ACA): Normal. Both A1 segments are present. Patent anterior communicating artery (a-comm). --Middle cerebral arteries (MCA): Normal. IMPRESSION: 1. Small amount of hemorrhage, likely subacute, within the left occipital lobe. Given the multifocal left PCA stenosis, this probably represents hemorrhage associated with a recent infarct. 2. Areas of hyperintense T2-weighted signal within the left temporal lobe may also be due to prior left PCA ischemic events. 3. Multifocal  stenosis of the distal left PCA. Otherwise normal intracranial MRA. Electronically Signed   By: Ulyses Jarred M.D.   On: 01/09/2020 02:00   MR BRAIN WO CONTRAST  Result Date: 01/09/2020 CLINICAL DATA:  Acute neurologic deficit. Slurred speech and facial numbness EXAM: MRI HEAD WITHOUT CONTRAST MRA HEAD WITHOUT CONTRAST TECHNIQUE: Multiplanar, multiecho pulse sequences of the brain and surrounding structures were obtained without intravenous contrast. Angiographic images of the head were obtained using MRA technique without contrast. COMPARISON:  None. FINDINGS: MRI HEAD FINDINGS Brain: There is a small amount of hemorrhage, likely subacute, within the left occipital lobe. It is unclear whether this is subarachnoid or intraparenchymal within the cortex. There are areas of hyperintense T2-weighted signal within the left temporal lobe. Normal volume of CSF spaces. No chronic microhemorrhage. Normal midline structures. Vascular: Normal flow voids. Skull and upper cervical spine:  Normal marrow signal. Sinuses/Orbits: Negative. Other: None. MRA HEAD FINDINGS POSTERIOR CIRCULATION: --Vertebral arteries: Normal V4 segments. --Inferior cerebellar arteries: Normal. --Basilar artery: Normal. --Superior cerebellar arteries: Normal. --Posterior cerebral arteries: Multifocal stenosis of the distal left PCA. Normal right PCA. ANTERIOR CIRCULATION: --Intracranial internal carotid arteries: Normal. --Anterior cerebral arteries (ACA): Normal. Both A1 segments are present. Patent anterior communicating artery (a-comm). --Middle cerebral arteries (MCA): Normal. IMPRESSION: 1. Small amount of hemorrhage, likely subacute, within the left occipital lobe. Given the multifocal left PCA stenosis, this probably represents hemorrhage associated with a recent infarct. 2. Areas of hyperintense T2-weighted signal within the left temporal lobe may also be due to prior left PCA ischemic events. 3. Multifocal stenosis of the distal left PCA.  Otherwise normal intracranial MRA. Electronically Signed   By: Ulyses Jarred M.D.   On: 01/09/2020 02:00   MR BRAIN W CONTRAST  Result Date: 01/10/2020 CLINICAL DATA:  Non-small cell lung cancer staging. EXAM: MRI HEAD WITH CONTRAST TECHNIQUE: Multiplanar, multiecho pulse sequences of the brain and surrounding structures were obtained with intravenous contrast. CONTRAST:  62mL GADAVIST GADOBUTROL 1 MMOL/ML IV SOLN COMPARISON:  Noncontrast head MRI 01/09/2020 FINDINGS: There are numerous enhancing lesions in both cerebral hemispheres with the largest measuring 1.4 cm in the left temporal lobe (series 5, image 19). There is minimal edema associated with this lesion as well as with a slightly smaller lesion more posteriorly at the left temporo-occipital junction. There are also multiple areas of abnormal leptomeningeal enhancement bilaterally, greatest in the left occipital region. No enhancing lesions are identified in the posterior fossa. An enhancing right frontoparietal skull lesion measures 1.6 cm (series 5, image 45). IMPRESSION: 1. 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 2. 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 Electronically Signed   By: Logan Bores M.D.   On: 01/10/2020 16:35   CT CHEST ABDOMEN PELVIS W CONTRAST  Result Date: 01/10/2020 CLINICAL DATA:  Further evaluation of lung nodules noted at the lung apices on the CTA neck from yesterday. Unintentional weight loss. EXAM: CT CHEST, ABDOMEN, AND PELVIS WITH CONTRAST TECHNIQUE: Multidetector CT imaging of the chest, abdomen and pelvis was performed following the standard protocol during bolus administration of intravenous contrast. CONTRAST:  144mL OMNIPAQUE IOHEXOL 300 MG/ML  SOLN COMPARISON:  CTA neck, 01/09/2020. FINDINGS: CT CHEST FINDINGS Cardiovascular: Heart is normal in size and configuration. No pericardial effusion. No coronary artery calcifications. Great vessels are normal  in caliber. Minimal aortic atherosclerosis along the descending portion. Branch vessels are widely patent. Mediastinum/Nodes: Masslike soft tissue along the anterior mediastinum anterior to the heart, 2.0 x 1.1 cm in size, possibly a mildly enlarged lymph node. No other mediastinal masses. No other evidence of an enlarged lymph node. No neck base or axillary masses or adenopathy. Trachea and esophagus are unremarkable. Lungs/Pleura: Left lower lobe mass with ill-defined margins, measuring approximately 7.8 cm from superior to inferior by 4.8 x 4.2 cm transversely. There are numerous bilateral pulmonary nodules consistent with widespread metastatic disease throughout both lungs. No evidence of pneumonia or pulmonary edema. No pleural effusion or pneumothorax. Musculoskeletal: Lucent bone lesions. The best defined arises from the transverse process of T5 on the right. Chest wall: Small nodule/mass in the right breast measuring 7 mm. CT ABDOMEN PELVIS FINDINGS Hepatobiliary: 3 cm mass in the peripheral aspect of the right liver lobe demonstrating peripheral nodular enhancement consistent with a hemangioma. No other liver masses or lesions. Normal gallbladder. No bile duct dilation. Pancreas: Unremarkable. No pancreatic  ductal dilatation or surrounding inflammatory changes. Spleen: Normal in size without focal abnormality. Adrenals/Urinary Tract: No adrenal masses. Kidneys are normal in size, orientation and position with symmetric enhancement and excretion. 4-5 mm low-attenuation oval mass in the midpole the right kidney, too small to fully characterize, but consistent with a cyst. 1 cm lower pole cyst from the right kidney. No other renal masses, no stones and no hydronephrosis. Normal ureters. Normal bladder. Stomach/Bowel: Normal stomach. Small bowel and colon are normal in caliber. No masses. No wall thickening or inflammation. Normal appendix visualized. Vascular/Lymphatic: No significant vascular findings are  present. No enlarged abdominal or pelvic lymph nodes. Reproductive: Status post hysterectomy. No adnexal masses. Other: No abdominal wall hernia.  No ascites. Musculoskeletal: There are lucent/lytic bone lesions. Lesions are noted in the anterior left acetabulum, at the base of the pubis, right ilium, upper sacrum and subtly along the lumbar spine. There is also an area of mixed sclerosis along the anterior right acetabulum. IMPRESSION: 1. Dominant mass in the left lower lobe consistent with a primary bronchogenic carcinoma. This measures approximately 7.8 x 4.2 x 4.8 cm. 2. Extensive metastatic disease to both lungs with numerous lung nodules throughout all lobes. 2.0 x 1.1 cm soft tissue mass versus a mildly enlarged lymph node in the anterior mediastinum anterior to the heart, possibly metastatic disease. 3. Skeletal metastatic disease with lucent bone lesions, the most definitively seen within the right T5 transverse process, the sacrum, right ilium and left anterior acetabulum/pubis. 4. No other evidence of metastatic disease. 5. No acute findings. 6. 3 cm right liver lobe hemangioma. Electronically Signed   By: Lajean Manes M.D.   On: 01/10/2020 10:10   DG CHEST PORT 1 VIEW  Result Date: 01/12/2020 CLINICAL DATA:  Status post bronchoscopy and biopsy today. EXAM: PORTABLE CHEST 1 VIEW COMPARISON:  CT chest, abdomen and pelvis 01/10/2020. FINDINGS: There is no pneumothorax after bronchoscopy. Innumerable bilateral pulmonary nodules and a left lower lobe mass are again seen. Heart size is normal. No pleural fluid. IMPRESSION: Negative for pneumothorax after bronchoscopy. Left lower lobe mass and innumerable pulmonary nodules as seen on prior CT. Electronically Signed   By: Inge Rise M.D.   On: 01/12/2020 15:03   EEG adult  Result Date: 01/12/2020 Lora Havens, MD     01/12/2020  1:46 PM Patient Name: Scottie Stanish MRN: [id number] Epilepsy Attending: Lora Havens Referring Physician/Provider:  Dr. Eleonore Chiquito Date: 01/11/2020 Duration: 23.03 minutes Patient history: 74 year old female with transient right facial numbness.  EEG to evaluate for seizures. Level of alertness: Awake AEDs during EEG study: Keppra Technical aspects: This EEG study was done with scalp electrodes positioned according to the 10-20 International system of electrode placement. Electrical activity was acquired at a sampling rate of 500Hz  and reviewed with a high frequency filter of 70Hz  and a low frequency filter of 1Hz . EEG data were recorded continuously and digitally stored. Description: The posterior dominant rhythm consists of 10 Hz activity of moderate voltage (25-35 uV) seen predominantly in posterior head regions, symmetric and reactive to eye opening and eye closing. Hyperventilation and photic stimulation were not performed.   IMPRESSION: This study is within normal limits. No seizures or epileptiform discharges were seen throughout the recording. Lora Havens   ECHOCARDIOGRAM COMPLETE  Result Date: 01/09/2020    ECHOCARDIOGRAM REPORT   Patient Name:   NIKIYAH FACKLER Date of Exam: 01/09/2020 Medical Rec #:  [id number]     Height:  66.0 in Accession #:    [id number]    Weight:       193.6 lb Date of Birth:  09-28-1945     BSA:          1.972 m Patient Age:    70 years      BP:           137/66 mmHg Patient Gender: F             HR:           59 bpm. Exam Location:  Inpatient Procedure: 2D Echo, Cardiac Doppler and Color Doppler Indications:    Stroke  History:        Patient has prior history of Echocardiogram examinations, most                 recent 07/07/2019. Arrythmias:LBBB; Risk Factors:Hypertension,                 Dyslipidemia and Sleep Apnea. Non-ischemic cardiomyopathy.  Sonographer:    Clayton Lefort RDCS (AE) Referring Phys: [place]  1. Left ventricular ejection fraction, by estimation, is 55 to 60%. The left ventricle has normal function. The left ventricle has no regional wall motion  abnormalities. There is mild left ventricular hypertrophy. Left ventricular diastolic parameters are consistent with Grade I diastolic dysfunction (impaired relaxation).  2. Right ventricular systolic function is normal. The right ventricular size is normal.  3. The mitral valve is grossly normal. Trivial mitral valve regurgitation.  4. The aortic valve is grossly normal. Aortic valve regurgitation is not visualized. No aortic stenosis is present. FINDINGS  Left Ventricle: Left ventricular ejection fraction, by estimation, is 55 to 60%. The left ventricle has normal function. The left ventricle has no regional wall motion abnormalities. The left ventricular internal cavity size was normal in size. There is  mild left ventricular hypertrophy. Left ventricular diastolic parameters are consistent with Grade I diastolic dysfunction (impaired relaxation). Right Ventricle: The right ventricular size is normal. No increase in right ventricular wall thickness. Right ventricular systolic function is normal. Left Atrium: Left atrial size was normal in size. Right Atrium: Right atrial size was normal in size. Pericardium: There is no evidence of pericardial effusion. Mitral Valve: The mitral valve is grossly normal. Trivial mitral valve regurgitation. MV peak gradient, 2.9 mmHg. The mean mitral valve gradient is 1.0 mmHg. Tricuspid Valve: The tricuspid valve is grossly normal. Tricuspid valve regurgitation is not demonstrated. Aortic Valve: The aortic valve is grossly normal. Aortic valve regurgitation is not visualized. No aortic stenosis is present. Aortic valve mean gradient measures 3.0 mmHg. Aortic valve peak gradient measures 6.2 mmHg. Aortic valve area, by VTI measures 2.10 cm. Pulmonic Valve: The pulmonic valve was grossly normal. Pulmonic valve regurgitation is trivial. Aorta: The aortic root and ascending aorta are structurally normal, with no evidence of dilitation. IAS/Shunts: The atrial septum is grossly normal.   LEFT VENTRICLE PLAX 2D LVIDd:         4.10 cm  Diastology LVIDs:         3.00 cm  LV e' lateral:   8.05 cm/s LV PW:         1.20 cm  LV E/e' lateral: 6.2 LV IVS:        1.20 cm  LV e' medial:    6.31 cm/s LVOT diam:     2.00 cm  LV E/e' medial:  7.9 LV SV:         50  LV SV Index:   25 LVOT Area:     3.14 cm  RIGHT VENTRICLE             IVC RV Basal diam:  3.00 cm     IVC diam: 0.90 cm RV S prime:     14.10 cm/s TAPSE (M-mode): 2.7 cm LEFT ATRIUM             Index       RIGHT ATRIUM           Index LA diam:        2.60 cm 1.32 cm/m  RA Area:     11.40 cm LA Vol (A2C):   35.9 ml 18.20 ml/m RA Volume:   21.90 ml  11.10 ml/m LA Vol (A4C):   32.8 ml 16.63 ml/m LA Biplane Vol: 36.6 ml 18.56 ml/m  AORTIC VALVE AV Area (Vmax):    1.98 cm AV Area (Vmean):   2.04 cm AV Area (VTI):     2.10 cm AV Vmax:           125.00 cm/s AV Vmean:          86.100 cm/s AV VTI:            0.238 m AV Peak Grad:      6.2 mmHg AV Mean Grad:      3.0 mmHg LVOT Vmax:         78.90 cm/s LVOT Vmean:        56.000 cm/s LVOT VTI:          0.159 m LVOT/AV VTI ratio: 0.67  AORTA Ao Root diam: 3.20 cm Ao Asc diam:  3.30 cm MITRAL VALVE MV Area (PHT): 2.45 cm    SHUNTS MV Peak grad:  2.9 mmHg    Systemic VTI:  0.16 m MV Mean grad:  1.0 mmHg    Systemic Diam: 2.00 cm MV Vmax:       0.85 m/s MV Vmean:      48.3 cm/s MV Decel Time: 310 msec MV E velocity: 50.10 cm/s MV A velocity: 74.10 cm/s MV E/A ratio:  0.68 Mertie Moores MD Electronically signed by Mertie Moores MD Signature Date/Time: 01/09/2020/1:34:44 PM    Final    VAS [place] LOWER EXTREMITY VENOUS (DVT)  Result Date: 01/10/2020  Lower Venous DVTStudy Indications: Embolic stroke.  Comparison Study: No prior studies. Performing Technologist: Darlin Coco  Examination Guidelines: A complete evaluation includes B-mode imaging, spectral Doppler, color Doppler, and power Doppler as needed of all accessible portions of each vessel. Bilateral testing is considered an integral part of a complete  examination. Limited examinations for reoccurring indications may be performed as noted. The reflux portion of the exam is performed with the patient in reverse Trendelenburg.  +---------+---------------+---------+-----------+----------+--------------+ RIGHT    CompressibilityPhasicitySpontaneityPropertiesThrombus Aging +---------+---------------+---------+-----------+----------+--------------+ CFV      Full           Yes      Yes                                 +---------+---------------+---------+-----------+----------+--------------+ SFJ      Full                                                        +---------+---------------+---------+-----------+----------+--------------+  FV Prox  Full                                                        +---------+---------------+---------+-----------+----------+--------------+ FV Mid   Full                                                        +---------+---------------+---------+-----------+----------+--------------+ FV DistalFull                                                        +---------+---------------+---------+-----------+----------+--------------+ PFV      Full                                                        +---------+---------------+---------+-----------+----------+--------------+ POP      Full           Yes      Yes                                 +---------+---------------+---------+-----------+----------+--------------+ PTV      Full                                                        +---------+---------------+---------+-----------+----------+--------------+ PERO     Full                                                        +---------+---------------+---------+-----------+----------+--------------+   +---------+---------------+---------+-----------+----------+--------------+ LEFT     CompressibilityPhasicitySpontaneityPropertiesThrombus Aging  +---------+---------------+---------+-----------+----------+--------------+ CFV      Full           Yes      Yes                                 +---------+---------------+---------+-----------+----------+--------------+ SFJ      Full                                                        +---------+---------------+---------+-----------+----------+--------------+ FV Prox  Full                                                        +---------+---------------+---------+-----------+----------+--------------+  FV Mid   Full                                                        +---------+---------------+---------+-----------+----------+--------------+ FV DistalFull                                                        +---------+---------------+---------+-----------+----------+--------------+ PFV      Full                                                        +---------+---------------+---------+-----------+----------+--------------+ POP      Full           Yes      Yes                                 +---------+---------------+---------+-----------+----------+--------------+ PTV      Full                                                        +---------+---------------+---------+-----------+----------+--------------+ PERO     Full                                                        +---------+---------------+---------+-----------+----------+--------------+     Summary: RIGHT: - There is no evidence of deep vein thrombosis in the lower extremity.  - No cystic structure found in the popliteal fossa.  LEFT: - There is no evidence of deep vein thrombosis in the lower extremity.  - No cystic structure found in the popliteal fossa.  *See table(s) above for measurements and observations. Electronically signed by Ruta Hinds MD on 01/10/2020 at 10:04:29 AM.    Final        IMPRESSION/PLAN: 1. 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  Dr. Lisbeth Renshaw discusses the findings as well as the biopsy results from her procedure, he discusses with the patient the findings and disease course of stage IV lung cancer.  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  We reviewed the poor prognosis of this, and even with treatment the prognosis.  She is interested in trying to gather as much information as possible and plans to meet with Dr. Julien Nordmann next week.  She does indicate that she may not be interested in systemic therapy, but after discussing options of palliative radiotherapy would lean towards pursuing a therapy that could be beneficial in improving quality of life.  We discussed options of palliative radiotherapy to the whole brain, right pubic  region including the iliac and SI region on the right as a single isocenter, another isocenter being the left pelvis including the left acetabulum and pubic ramus, and the fourth isocenter being her left lower lung.  The purpose of treating the lung while she is not significantly symptomatic would be to try and reduce the risk of pneumonia or occlusion of her airway.  We reviewed the risks, benefits, short and long-term effects of radiotherapy and the patient is interested in proceeding.  She will simulate today. Written consent is obtained and placed in the chart, a copy was provided to the patient. 2. Goals of care.  The patient will meet with Dr. Julien Nordmann to determine next steps for any options of systemic therapy, she is contemplating enrolling in hospice after completion of palliative radiotherapy and we will follow this expectantly.  In a visit lasting 90 minutes, greater than 50% of the time was spent face to face discussing the patient's condition, in preparation for the discussion, and coordinating the patient's care.    The above documentation reflects my direct findings during  this shared patient visit. Please see the separate note by Dr. Lisbeth Renshaw on this date for the remainder of the patient's plan of care.    Carola Rhine, PAC

## 2020-01-14 NOTE — Progress Notes (Signed)
Location/Histology of Brain Tumor:  Leptomeningeal disease-Multiple brain mets  Primary Lung Cancer  Patient presented with stroke like symptoms, slurred speech, facial numbness.  Bronchoscopy 01/12/2020: Left Lower Lobe Lung    MRI Brain 01/10/2020: Multiple intracranial metastases reflecting a combination of parenchymal and leptomeningeal disease. 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  CT CAP 01/10/2020: Dominant mass in the left lower lobe consistent with a primary bronchogenic carcinoma. This measures approximately 7.8 x 4.2 x 4.8 cm. 2. Extensive metastatic disease to both lungs with numerous lung nodules throughout all lobes. 2.0 x 1.1 cm soft tissue mass versus a mildly enlarged lymph node in the anterior mediastinum anterior to the heart, possibly metastatic disease. 3. Skeletal metastatic disease with lucent bone lesions, the most definitively seen within the right T5 transverse process, the sacrum, right ilium and left anterior acetabulum/pubis. 4. No other evidence of metastatic disease.  MRI Brain 01/09/2020:Small amount of hemorrhage, likely subacute, within the left occipital lobe. Given the multifocal left PCA stenosis, this probably represents hemorrhage associated with a recent infarct. Areas of hyperintense T2-weighted signal within the left temporal lobe may also be due to prior left PCA ischemic events.    CT Head 01/08/2020: negative  Past or anticipated interventions, if any, per neurosurgery:   Past or anticipated interventions, if any, per medical oncology:  Dr. Earlie Server 01/18/2020   Dose of Decadron, if applicable:   Recent neurologic symptoms, if any:   Seizures: No  Headaches:  Has dull pressure across her forehead daily around 6 pm.  Nausea: No  Dizziness/ataxia: Has vertigo often at baseline.  Difficulty with hand coordination:   Focal numbness/weakness: Has occasional numbness in her hands, especially when she sleeps on her sides, while driving her  hands feel a little numb.  Visual deficits/changes: Has decreased peripheral vision on her left side.  Confusion/Memory deficits: Yes. Having some word finding.  Speech: slurring of verbs noted in the last couple of weeks.   SAFETY ISSUES:  Prior radiation? No  Pacemaker/ICD? No  Possible current pregnancy? Postmenopausal  Is the patient on methotrexate? No  Additional Complaints / other details:

## 2020-01-14 NOTE — Anesthesia Postprocedure Evaluation (Signed)
Anesthesia Post Note  Patient: Stefanie Camacho  Procedure(s) Performed: VIDEO BRONCHOSCOPY WITH ENDOBRONCHIAL ULTRASOUND BRONCHIAL NEEDLE ASPIRATION BIOPSIES BRONCHIAL BRUSHINGS BRONCHIAL BIOPSIES BRONCHIAL WASHINGS     Patient location during evaluation: PACU Anesthesia Type: MAC Level of consciousness: awake and alert Pain management: pain level controlled Vital Signs Assessment: post-procedure vital signs reviewed and stable Respiratory status: spontaneous breathing, nonlabored ventilation, respiratory function stable and patient connected to nasal cannula oxygen Cardiovascular status: blood pressure returned to baseline and stable Postop Assessment: no apparent nausea or vomiting Anesthetic complications: no   No complications documented.  Last Vitals:  Vitals:   01/12/20 2000 01/13/20 0742  BP: (!) 135/57 (!) 143/66  Pulse: 80 63  Resp: 20 18  Temp: 36.5 C 36.8 C  SpO2: 97% 94%    Last Pain:  Vitals:   01/13/20 0834  TempSrc:   PainSc: 1                  Carolynne Schuchard

## 2020-01-15 ENCOUNTER — Telehealth: Payer: Self-pay | Admitting: *Deleted

## 2020-01-15 DIAGNOSIS — C3432 Malignant neoplasm of lower lobe, left bronchus or lung: Secondary | ICD-10-CM | POA: Diagnosis not present

## 2020-01-15 DIAGNOSIS — C7951 Secondary malignant neoplasm of bone: Secondary | ICD-10-CM | POA: Diagnosis not present

## 2020-01-15 DIAGNOSIS — C7931 Secondary malignant neoplasm of brain: Secondary | ICD-10-CM | POA: Diagnosis not present

## 2020-01-15 DIAGNOSIS — Z510 Encounter for antineoplastic radiation therapy: Secondary | ICD-10-CM | POA: Diagnosis not present

## 2020-01-15 NOTE — Discharge Summary (Signed)
Physician Discharge Summary  Stefanie Camacho BOF:[id number] DOB: June 26, 1945 DOA: 01/08/2020  PCP: [name], MD  Admit date: 01/08/2020 Discharge date: 01/13/2020  Admitted From: Home.  Disposition: Home.   Recommendations for Outpatient Follow-up:  1. Follow up with PCP in 1-2 weeks 2. Please obtain BMP/CBC in one week Please follow up with oncology Dr Mckinley Jewel in one week  Please follow up the results of the biopsy    Discharge Condition:stable.  CODE STATUS:FULL CODE. Diet recommendation: Heart Healthy   Brief/Interim Summary:  74 year old female with a history of nonischemic cardiomyopathy, hypothyroidism, hypertension who experienced slurred speech about a week ago when she was talking to her friend on phone. This lasted for about 15 minutes and resolved. About 4 days ago patient had right facial numbness which lasted for few seconds and resolved. She was advised to come to the ED. She did not have weakness of upper or lower extremities difficulty swallowing or any visual symptoms. In the ED CT head was unremarkable MRI of the brain and MRI of the brain showed hemorrhagic conversion of stroke. Neurology was consulted. CTA neck showed multiple pulmonary nodules, CT chest/abdomen/pelvis was obtained which showed large left lung mass with pulmonary metastasis, consistent with bronchogenic carcinoma. There is also metastasis to spine and ileum, left acetabulum. She underwent bronchoscopy and biopsies, results are pending.  She is stable for discharge, recommended to follow up with Dr Julien Nordmann on less than a week.   Discharge Diagnoses:  Principal Problem:   Acute CVA (cerebrovascular accident) [facility]) Active Problems:   Nonischemic cardiomyopathy ([place])   Essential hypertension   Left bundle branch block   Dyslipidemia   Hypothyroidism   Intracranial bleeding ([place])  1. 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 -discussed with neurology, patient does not have stroke.   Continue aspirin 81 mg daily, Lipitor 10 mg daily, both aspirin and Lipitor are her home medications.  Lower extremity Dopplers negative for DVT. 2. Metastatic lung cancer-CT chest/abdomen/pelvis showed large left lung mass with mets to lungs, spine.  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  Pulmonology was consulted, underwent bronchoscopy today for tissue diagnosis.  Palliative care has been consulted.   Patient will need to follow-up with oncology as outpatient.     3. ?  Seizure-patient was having numbness of right lip, discussed with neurology, IV Keppra was recommended.  1 g IV Keppra was given in the morning.  Will start Keppra 500 p.o. twice daily.  Also started on  Decadron 4 mg IV every 12 hours for minimal cerebral edema.  Discussed with neurology and oncology Dr Alen Blew,, patient does not have significant edema.  Will discontinue Decadron at this time.   EEG has been negative for epileptiform discharges.  Patient can continue with Keppra 500 mg p.o. twice daily at discharge. 4. Hypertension-blood pressures controlled, continue metoprolol, losartan, spironolactone 5. Hyperlipidemia-continue Lipitor 10 mg daily 6. Hypothyroidism-continue Synthroid    Discharge Instructions  Discharge Instructions    Ambulatory referral to Occupational Therapy   Complete by: As directed    Ambulatory referral to Oncology   Complete by: As directed    Metastatic lung cancer, biopsy done in the hospital.   Ambulatory referral to Physical Therapy   Complete by: As directed    Diet - low sodium heart healthy   Complete by: As directed    Diet - low sodium heart healthy   Complete by: As directed    Discharge instructions   Complete by: As  directed    Please follow up with oncologist Dr Julien Nordmann in less than one week.  Please follow up with pulmonologist as recommended.  Please follow up with PCP in one week.   Discharge instructions   Complete by:  As directed    Please follow up with Dr Julien Nordmann as recommended.     Allergies as of 01/13/2020   No Known Allergies     Medication List    STOP taking these medications   diazepam 5 MG tablet Commonly known as: VALIUM   gabapentin 300 MG capsule Commonly known as: NEURONTIN   HYDROcodone-acetaminophen 5-325 MG tablet Commonly known as: NORCO/VICODIN   methylPREDNISolone 4 MG tablet Commonly known as: Medrol   omeprazole 20 MG capsule Commonly known as: PRILOSEC Replaced by: pantoprazole 40 MG tablet     TAKE these medications   acetaminophen-codeine 300-30 MG tablet Commonly known as: TYLENOL #3 Take 1-2 tablets by mouth every 8 (eight) hours as needed for up to 3 days for moderate pain.   aspirin EC 81 MG tablet Take 81 mg by mouth daily.   atorvastatin 10 MG tablet Commonly known as: LIPITOR Take 1 tablet (10 mg total) by mouth daily at 6 PM.   FLUoxetine 10 MG tablet Commonly known as: PROZAC Take 1 tablet (10 mg total) by mouth daily.   levETIRAcetam 500 MG tablet Commonly known as: KEPPRA Take 1 tablet (500 mg total) by mouth 2 (two) times daily.   levothyroxine 75 MCG tablet Commonly known as: SYNTHROID Take 1 tablet (75 mcg total) by mouth daily. What changed:   medication strength  how much to take  additional instructions  Another medication with the same name was removed. Continue taking this medication, and follow the directions you see here.   losartan 50 MG tablet Commonly known as: COZAAR Take 1 tablet (50 mg total) by mouth daily.   metoprolol succinate 50 MG 24 hr tablet Commonly known as: TOPROL-XL Take 1 tablet (50 mg total) by mouth daily. Take with or immediately following a meal.   pantoprazole 40 MG tablet Commonly known as: PROTONIX Take 1 tablet (40 mg total) by mouth daily. Replaces: omeprazole 20 MG capsule   spironolactone 25 MG tablet Commonly known as: ALDACTONE Take 0.5 tablets (12.5 mg total) by mouth daily.        Follow-up Information    Icard, Bradley L, DO.   Specialty: Pulmonary Disease Contact information: [address] [place] [address] [phone]        Curt Bears, MD. Go on 01/18/2020.   Specialty: Oncology Contact information: [place] [address] [place] Follow up.   Specialty: Rehabilitation Why: The Outpatient therapy will contact you for the first appointment Contact information: [place]       [name], MD. Schedule an appointment as soon as possible for a visit in 1 week(s).   Specialty: Family Medicine Contact information: [place] [address] [phone]        Park Liter, MD .   Specialty: Cardiology Contact information: [place] [address] [phone]        [facility]. Schedule an appointment as soon as possible for a visit in 4 week(s).   Specialty: Neurology Contact information: [address] [place] [phone]  No Known Allergies  Consultations:  PCCM.    Procedures/Studies: CT ANGIO HEAD W OR WO CONTRAST  Result Date: 01/09/2020 CLINICAL DATA:  Stroke EXAM: CT ANGIOGRAPHY HEAD AND NECK TECHNIQUE: Multidetector CT imaging of the head and neck was performed using the standard protocol during bolus administration of intravenous contrast. Multiplanar CT image reconstructions and MIPs were obtained to evaluate the vascular anatomy. Carotid stenosis measurements (when applicable) are obtained utilizing NASCET criteria, using the distal internal carotid diameter as the denominator. CONTRAST:  45mL OMNIPAQUE IOHEXOL 350 MG/ML SOLN COMPARISON:  None. FINDINGS: CTA NECK FINDINGS SKELETON: There is no bony spinal  canal stenosis. No lytic or blastic lesion. OTHER NECK: Normal pharynx, larynx and major salivary glands. No cervical lymphadenopathy. Unremarkable thyroid gland. UPPER CHEST: Innumerable nodules throughout both lungs. AORTIC ARCH: There is no calcific atherosclerosis of the aortic arch. There is no aneurysm, dissection or hemodynamically significant stenosis of the visualized portion of the aorta. Normal variant aortic arch branching pattern with the brachiocephalic and left common carotid arteries sharing a common origin. The visualized proximal subclavian arteries are widely patent. RIGHT CAROTID SYSTEM: Normal without aneurysm, dissection or stenosis. LEFT CAROTID SYSTEM: Normal without aneurysm, dissection or stenosis. VERTEBRAL ARTERIES: Left dominant configuration. Both origins are clearly patent. There is no dissection, occlusion or flow-limiting stenosis to the skull base (V1-V3 segments). CTA HEAD FINDINGS POSTERIOR CIRCULATION: --Vertebral arteries: Normal V4 segments. --Inferior cerebellar arteries: Normal. --Basilar artery: Normal. --Superior cerebellar arteries: Normal. --Posterior cerebral arteries (PCA): Normal. ANTERIOR CIRCULATION: --Intracranial internal carotid arteries: Normal. --Anterior cerebral arteries (ACA): Normal. Both A1 segments are present. Patent anterior communicating artery (a-comm). --Middle cerebral arteries (MCA): Normal. VENOUS SINUSES: As permitted by contrast timing, patent. ANATOMIC VARIANTS: None Review of the MIP images confirms the above findings. IMPRESSION: 1. No emergent large vessel occlusion or high-grade stenosis of the intracranial arteries. 2. Innumerable nodular pulmonary metastases. Electronically Signed   By: Ulyses Jarred M.D.   On: 01/09/2020 05:17   CT HEAD WO CONTRAST  Result Date: 01/08/2020 CLINICAL DATA:  TIA.  Right facial numbness 2 days ago. EXAM: CT HEAD WITHOUT CONTRAST TECHNIQUE: Contiguous axial images were obtained from the base of the skull  through the vertex without intravenous contrast. COMPARISON:  None. FINDINGS: Brain: No evidence of acute infarction, hemorrhage, hydrocephalus, extra-axial collection or mass lesion/mass effect. Vascular: Negative for hyperdense vessel Skull: Negative Sinuses/Orbits: Paranasal sinuses clear. Bilateral cataract extraction. No orbital lesion. Other: None IMPRESSION: Negative CT head Electronically Signed   By: Franchot Gallo M.D.   On: 01/08/2020 13:50   CT ANGIO NECK W OR WO CONTRAST  Result Date: 01/09/2020 CLINICAL DATA:  Stroke EXAM: CT ANGIOGRAPHY HEAD AND NECK TECHNIQUE: Multidetector CT imaging of the head and neck was performed using the standard protocol during bolus administration of intravenous contrast. Multiplanar CT image reconstructions and MIPs were obtained to evaluate the vascular anatomy. Carotid stenosis measurements (when applicable) are obtained utilizing NASCET criteria, using the distal internal carotid diameter as the denominator. CONTRAST:  55mL OMNIPAQUE IOHEXOL 350 MG/ML SOLN COMPARISON:  None. FINDINGS: CTA NECK FINDINGS SKELETON: There is no bony spinal canal stenosis. No lytic or blastic lesion. OTHER NECK: Normal pharynx, larynx and major salivary glands. No cervical lymphadenopathy. Unremarkable thyroid gland. UPPER CHEST: Innumerable nodules throughout both lungs. AORTIC ARCH: There is no calcific atherosclerosis of the aortic arch. There is no aneurysm, dissection or hemodynamically significant stenosis of the visualized portion of the aorta. Normal variant aortic arch branching pattern with the brachiocephalic and  left common carotid arteries sharing a common origin. The visualized proximal subclavian arteries are widely patent. RIGHT CAROTID SYSTEM: Normal without aneurysm, dissection or stenosis. LEFT CAROTID SYSTEM: Normal without aneurysm, dissection or stenosis. VERTEBRAL ARTERIES: Left dominant configuration. Both origins are clearly patent. There is no dissection,  occlusion or flow-limiting stenosis to the skull base (V1-V3 segments). CTA HEAD FINDINGS POSTERIOR CIRCULATION: --Vertebral arteries: Normal V4 segments. --Inferior cerebellar arteries: Normal. --Basilar artery: Normal. --Superior cerebellar arteries: Normal. --Posterior cerebral arteries (PCA): Normal. ANTERIOR CIRCULATION: --Intracranial internal carotid arteries: Normal. --Anterior cerebral arteries (ACA): Normal. Both A1 segments are present. Patent anterior communicating artery (a-comm). --Middle cerebral arteries (MCA): Normal. VENOUS SINUSES: As permitted by contrast timing, patent. ANATOMIC VARIANTS: None Review of the MIP images confirms the above findings. IMPRESSION: 1. No emergent large vessel occlusion or high-grade stenosis of the intracranial arteries. 2. Innumerable nodular pulmonary metastases. Electronically Signed   By: Ulyses Jarred M.D.   On: 01/09/2020 05:17   MR ANGIO HEAD WO CONTRAST  Result Date: 01/09/2020 CLINICAL DATA:  Acute neurologic deficit. Slurred speech and facial numbness EXAM: MRI HEAD WITHOUT CONTRAST MRA HEAD WITHOUT CONTRAST TECHNIQUE: Multiplanar, multiecho pulse sequences of the brain and surrounding structures were obtained without intravenous contrast. Angiographic images of the head were obtained using MRA technique without contrast. COMPARISON:  None. FINDINGS: MRI HEAD FINDINGS Brain: There is a small amount of hemorrhage, likely subacute, within the left occipital lobe. It is unclear whether this is subarachnoid or intraparenchymal within the cortex. There are areas of hyperintense T2-weighted signal within the left temporal lobe. Normal volume of CSF spaces. No chronic microhemorrhage. Normal midline structures. Vascular: Normal flow voids. Skull and upper cervical spine: Normal marrow signal. Sinuses/Orbits: Negative. Other: None. MRA HEAD FINDINGS POSTERIOR CIRCULATION: --Vertebral arteries: Normal V4 segments. --Inferior cerebellar arteries: Normal. --Basilar  artery: Normal. --Superior cerebellar arteries: Normal. --Posterior cerebral arteries: Multifocal stenosis of the distal left PCA. Normal right PCA. ANTERIOR CIRCULATION: --Intracranial internal carotid arteries: Normal. --Anterior cerebral arteries (ACA): Normal. Both A1 segments are present. Patent anterior communicating artery (a-comm). --Middle cerebral arteries (MCA): Normal. IMPRESSION: 1. Small amount of hemorrhage, likely subacute, within the left occipital lobe. Given the multifocal left PCA stenosis, this probably represents hemorrhage associated with a recent infarct. 2. Areas of hyperintense T2-weighted signal within the left temporal lobe may also be due to prior left PCA ischemic events. 3. Multifocal stenosis of the distal left PCA. Otherwise normal intracranial MRA. Electronically Signed   By: Ulyses Jarred M.D.   On: 01/09/2020 02:00   MR BRAIN WO CONTRAST  Result Date: 01/09/2020 CLINICAL DATA:  Acute neurologic deficit. Slurred speech and facial numbness EXAM: MRI HEAD WITHOUT CONTRAST MRA HEAD WITHOUT CONTRAST TECHNIQUE: Multiplanar, multiecho pulse sequences of the brain and surrounding structures were obtained without intravenous contrast. Angiographic images of the head were obtained using MRA technique without contrast. COMPARISON:  None. FINDINGS: MRI HEAD FINDINGS Brain: There is a small amount of hemorrhage, likely subacute, within the left occipital lobe. It is unclear whether this is subarachnoid or intraparenchymal within the cortex. There are areas of hyperintense T2-weighted signal within the left temporal lobe. Normal volume of CSF spaces. No chronic microhemorrhage. Normal midline structures. Vascular: Normal flow voids. Skull and upper cervical spine: Normal marrow signal. Sinuses/Orbits: Negative. Other: None. MRA HEAD FINDINGS POSTERIOR CIRCULATION: --Vertebral arteries: Normal V4 segments. --Inferior cerebellar arteries: Normal. --Basilar artery: Normal. --Superior cerebellar  arteries: Normal. --Posterior cerebral arteries: Multifocal stenosis of the distal left PCA. Normal right PCA.  ANTERIOR CIRCULATION: --Intracranial internal carotid arteries: Normal. --Anterior cerebral arteries (ACA): Normal. Both A1 segments are present. Patent anterior communicating artery (a-comm). --Middle cerebral arteries (MCA): Normal. IMPRESSION: 1. Small amount of hemorrhage, likely subacute, within the left occipital lobe. Given the multifocal left PCA stenosis, this probably represents hemorrhage associated with a recent infarct. 2. Areas of hyperintense T2-weighted signal within the left temporal lobe may also be due to prior left PCA ischemic events. 3. Multifocal stenosis of the distal left PCA. Otherwise normal intracranial MRA. Electronically Signed   By: Ulyses Jarred M.D.   On: 01/09/2020 02:00   MR BRAIN W CONTRAST  Result Date: 01/10/2020 CLINICAL DATA:  Non-small cell lung cancer staging. EXAM: MRI HEAD WITH CONTRAST TECHNIQUE: Multiplanar, multiecho pulse sequences of the brain and surrounding structures were obtained with intravenous contrast. CONTRAST:  38mL GADAVIST GADOBUTROL 1 MMOL/ML IV SOLN COMPARISON:  Noncontrast head MRI 01/09/2020 FINDINGS: There are numerous enhancing lesions in both cerebral hemispheres with the largest measuring 1.4 cm in the left temporal lobe (series 5, image 19). There is minimal edema associated with this lesion as well as with a slightly smaller lesion more posteriorly at the left temporo-occipital junction. There are also multiple areas of abnormal leptomeningeal enhancement bilaterally, greatest in the left occipital region. No enhancing lesions are identified in the posterior fossa. An enhancing right frontoparietal skull lesion measures 1.6 cm (series 5, image 45). IMPRESSION: 1. 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 2. 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 Electronically Signed    By: Logan Bores M.D.   On: 01/10/2020 16:35   CT CHEST ABDOMEN PELVIS W CONTRAST  Result Date: 01/10/2020 CLINICAL DATA:  Further evaluation of lung nodules noted at the lung apices on the CTA neck from yesterday. Unintentional weight loss. EXAM: CT CHEST, ABDOMEN, AND PELVIS WITH CONTRAST TECHNIQUE: Multidetector CT imaging of the chest, abdomen and pelvis was performed following the standard protocol during bolus administration of intravenous contrast. CONTRAST:  115mL OMNIPAQUE IOHEXOL 300 MG/ML  SOLN COMPARISON:  CTA neck, 01/09/2020. FINDINGS: CT CHEST FINDINGS Cardiovascular: Heart is normal in size and configuration. No pericardial effusion. No coronary artery calcifications. Great vessels are normal in caliber. Minimal aortic atherosclerosis along the descending portion. Branch vessels are widely patent. Mediastinum/Nodes: Masslike soft tissue along the anterior mediastinum anterior to the heart, 2.0 x 1.1 cm in size, possibly a mildly enlarged lymph node. No other mediastinal masses. No other evidence of an enlarged lymph node. No neck base or axillary masses or adenopathy. Trachea and esophagus are unremarkable. Lungs/Pleura: Left lower lobe mass with ill-defined margins, measuring approximately 7.8 cm from superior to inferior by 4.8 x 4.2 cm transversely. There are numerous bilateral pulmonary nodules consistent with widespread metastatic disease throughout both lungs. No evidence of pneumonia or pulmonary edema. No pleural effusion or pneumothorax. Musculoskeletal: Lucent bone lesions. The best defined arises from the transverse process of T5 on the right. Chest wall: Small nodule/mass in the right breast measuring 7 mm. CT ABDOMEN PELVIS FINDINGS Hepatobiliary: 3 cm mass in the peripheral aspect of the right liver lobe demonstrating peripheral nodular enhancement consistent with a hemangioma. No other liver masses or lesions. Normal gallbladder. No bile duct dilation. Pancreas: Unremarkable. No  pancreatic ductal dilatation or surrounding inflammatory changes. Spleen: Normal in size without focal abnormality. Adrenals/Urinary Tract: No adrenal masses. Kidneys are normal in size, orientation and position with symmetric enhancement and excretion. 4-5 mm low-attenuation oval mass in the midpole the right  kidney, too small to fully characterize, but consistent with a cyst. 1 cm lower pole cyst from the right kidney. No other renal masses, no stones and no hydronephrosis. Normal ureters. Normal bladder. Stomach/Bowel: Normal stomach. Small bowel and colon are normal in caliber. No masses. No wall thickening or inflammation. Normal appendix visualized. Vascular/Lymphatic: No significant vascular findings are present. No enlarged abdominal or pelvic lymph nodes. Reproductive: Status post hysterectomy. No adnexal masses. Other: No abdominal wall hernia.  No ascites. Musculoskeletal: There are lucent/lytic bone lesions. Lesions are noted in the anterior left acetabulum, at the base of the pubis, right ilium, upper sacrum and subtly along the lumbar spine. There is also an area of mixed sclerosis along the anterior right acetabulum. IMPRESSION: 1. Dominant mass in the left lower lobe consistent with a primary bronchogenic carcinoma. This measures approximately 7.8 x 4.2 x 4.8 cm. 2. Extensive metastatic disease to both lungs with numerous lung nodules throughout all lobes. 2.0 x 1.1 cm soft tissue mass versus a mildly enlarged lymph node in the anterior mediastinum anterior to the heart, possibly metastatic disease. 3. Skeletal metastatic disease with lucent bone lesions, the most definitively seen within the right T5 transverse process, the sacrum, right ilium and left anterior acetabulum/pubis. 4. No other evidence of metastatic disease. 5. No acute findings. 6. 3 cm right liver lobe hemangioma. Electronically Signed   By: Lajean Manes M.D.   On: 01/10/2020 10:10   DG CHEST PORT 1 VIEW  Result Date:  01/12/2020 CLINICAL DATA:  Status post bronchoscopy and biopsy today. EXAM: PORTABLE CHEST 1 VIEW COMPARISON:  CT chest, abdomen and pelvis 01/10/2020. FINDINGS: There is no pneumothorax after bronchoscopy. Innumerable bilateral pulmonary nodules and a left lower lobe mass are again seen. Heart size is normal. No pleural fluid. IMPRESSION: Negative for pneumothorax after bronchoscopy. Left lower lobe mass and innumerable pulmonary nodules as seen on prior CT. Electronically Signed   By: Inge Rise M.D.   On: 01/12/2020 15:03   EEG adult  Result Date: 01/12/2020 Lora Havens, MD     01/12/2020  1:46 PM Patient Name: Stefanie Camacho MRN: [id number] Epilepsy Attending: Lora Havens Referring Physician/Provider: Dr. Eleonore Chiquito Date: 01/11/2020 Duration: 23.03 minutes Patient history: 74 year old female with transient right facial numbness.  EEG to evaluate for seizures. Level of alertness: Awake AEDs during EEG study: Keppra Technical aspects: This EEG study was done with scalp electrodes positioned according to the 10-20 International system of electrode placement. Electrical activity was acquired at a sampling rate of 500Hz  and reviewed with a high frequency filter of 70Hz  and a low frequency filter of 1Hz . EEG data were recorded continuously and digitally stored. Description: The posterior dominant rhythm consists of 10 Hz activity of moderate voltage (25-35 uV) seen predominantly in posterior head regions, symmetric and reactive to eye opening and eye closing. Hyperventilation and photic stimulation were not performed.   IMPRESSION: This study is within normal limits. No seizures or epileptiform discharges were seen throughout the recording. Lora Havens   ECHOCARDIOGRAM COMPLETE  Result Date: 01/09/2020    ECHOCARDIOGRAM REPORT   Patient Name:   Stefanie Camacho Date of Exam: 01/09/2020 Medical Rec #:  [id number]     Height:       66.0 in Accession #:    [id number]    Weight:       193.6 lb Date of  Birth:  [birth year]     BSA:          1.972  m Patient Age:    8 years      BP:           137/66 mmHg Patient Gender: F             HR:           59 bpm. Exam Location:  Inpatient Procedure: 2D Echo, Cardiac Doppler and Color Doppler Indications:    Stroke  History:        Patient has prior history of Echocardiogram examinations, most                 recent 07/07/2019. Arrythmias:LBBB; Risk Factors:Hypertension,                 Dyslipidemia and Sleep Apnea. Non-ischemic cardiomyopathy.  Sonographer:    Clayton Lefort RDCS (AE) Referring Phys: [place]  1. Left ventricular ejection fraction, by estimation, is 55 to 60%. The left ventricle has normal function. The left ventricle has no regional wall motion abnormalities. There is mild left ventricular hypertrophy. Left ventricular diastolic parameters are consistent with Grade I diastolic dysfunction (impaired relaxation).  2. Right ventricular systolic function is normal. The right ventricular size is normal.  3. The mitral valve is grossly normal. Trivial mitral valve regurgitation.  4. The aortic valve is grossly normal. Aortic valve regurgitation is not visualized. No aortic stenosis is present. FINDINGS  Left Ventricle: Left ventricular ejection fraction, by estimation, is 55 to 60%. The left ventricle has normal function. The left ventricle has no regional wall motion abnormalities. The left ventricular internal cavity size was normal in size. There is  mild left ventricular hypertrophy. Left ventricular diastolic parameters are consistent with Grade I diastolic dysfunction (impaired relaxation). Right Ventricle: The right ventricular size is normal. No increase in right ventricular wall thickness. Right ventricular systolic function is normal. Left Atrium: Left atrial size was normal in size. Right Atrium: Right atrial size was normal in size. Pericardium: There is no evidence of pericardial effusion. Mitral Valve: The mitral valve is  grossly normal. Trivial mitral valve regurgitation. MV peak gradient, 2.9 mmHg. The mean mitral valve gradient is 1.0 mmHg. Tricuspid Valve: The tricuspid valve is grossly normal. Tricuspid valve regurgitation is not demonstrated. Aortic Valve: The aortic valve is grossly normal. Aortic valve regurgitation is not visualized. No aortic stenosis is present. Aortic valve mean gradient measures 3.0 mmHg. Aortic valve peak gradient measures 6.2 mmHg. Aortic valve area, by VTI measures 2.10 cm. Pulmonic Valve: The pulmonic valve was grossly normal. Pulmonic valve regurgitation is trivial. Aorta: The aortic root and ascending aorta are structurally normal, with no evidence of dilitation. IAS/Shunts: The atrial septum is grossly normal.  LEFT VENTRICLE PLAX 2D LVIDd:         4.10 cm  Diastology LVIDs:         3.00 cm  LV e' lateral:   8.05 cm/s LV PW:         1.20 cm  LV E/e' lateral: 6.2 LV IVS:        1.20 cm  LV e' medial:    6.31 cm/s LVOT diam:     2.00 cm  LV E/e' medial:  7.9 LV SV:         50 LV SV Index:   25 LVOT Area:     3.14 cm  RIGHT VENTRICLE             IVC RV Basal diam:  3.00 cm     IVC  diam: 0.90 cm RV S prime:     14.10 cm/s TAPSE (M-mode): 2.7 cm LEFT ATRIUM             Index       RIGHT ATRIUM           Index LA diam:        2.60 cm 1.32 cm/m  RA Area:     11.40 cm LA Vol (A2C):   35.9 ml 18.20 ml/m RA Volume:   21.90 ml  11.10 ml/m LA Vol (A4C):   32.8 ml 16.63 ml/m LA Biplane Vol: 36.6 ml 18.56 ml/m  AORTIC VALVE AV Area (Vmax):    1.98 cm AV Area (Vmean):   2.04 cm AV Area (VTI):     2.10 cm AV Vmax:           125.00 cm/s AV Vmean:          86.100 cm/s AV VTI:            0.238 m AV Peak Grad:      6.2 mmHg AV Mean Grad:      3.0 mmHg LVOT Vmax:         78.90 cm/s LVOT Vmean:        56.000 cm/s LVOT VTI:          0.159 m LVOT/AV VTI ratio: 0.67  AORTA Ao Root diam: 3.20 cm Ao Asc diam:  3.30 cm MITRAL VALVE MV Area (PHT): 2.45 cm    SHUNTS MV Peak grad:  2.9 mmHg    Systemic VTI:  0.16 m  MV Mean grad:  1.0 mmHg    Systemic Diam: 2.00 cm MV Vmax:       0.85 m/s MV Vmean:      48.3 cm/s MV Decel Time: 310 msec MV E velocity: 50.10 cm/s MV A velocity: 74.10 cm/s MV E/A ratio:  0.68 Mertie Moores MD Electronically signed by Mertie Moores MD Signature Date/Time: 01/09/2020/1:34:44 PM    Final    VAS [place] LOWER EXTREMITY VENOUS (DVT)  Result Date: 01/10/2020  Lower Venous DVTStudy Indications: Embolic stroke.  Comparison Study: No prior studies. Performing Technologist: Darlin Coco  Examination Guidelines: A complete evaluation includes B-mode imaging, spectral Doppler, color Doppler, and power Doppler as needed of all accessible portions of each vessel. Bilateral testing is considered an integral part of a complete examination. Limited examinations for reoccurring indications may be performed as noted. The reflux portion of the exam is performed with the patient in reverse Trendelenburg.  +---------+---------------+---------+-----------+----------+--------------+ RIGHT    CompressibilityPhasicitySpontaneityPropertiesThrombus Aging +---------+---------------+---------+-----------+----------+--------------+ CFV      Full           Yes      Yes                                 +---------+---------------+---------+-----------+----------+--------------+ SFJ      Full                                                        +---------+---------------+---------+-----------+----------+--------------+ FV Prox  Full                                                        +---------+---------------+---------+-----------+----------+--------------+  FV Mid   Full                                                        +---------+---------------+---------+-----------+----------+--------------+ FV DistalFull                                                        +---------+---------------+---------+-----------+----------+--------------+ PFV      Full                                                         +---------+---------------+---------+-----------+----------+--------------+ POP      Full           Yes      Yes                                 +---------+---------------+---------+-----------+----------+--------------+ PTV      Full                                                        +---------+---------------+---------+-----------+----------+--------------+ PERO     Full                                                        +---------+---------------+---------+-----------+----------+--------------+   +---------+---------------+---------+-----------+----------+--------------+ LEFT     CompressibilityPhasicitySpontaneityPropertiesThrombus Aging +---------+---------------+---------+-----------+----------+--------------+ CFV      Full           Yes      Yes                                 +---------+---------------+---------+-----------+----------+--------------+ SFJ      Full                                                        +---------+---------------+---------+-----------+----------+--------------+ FV Prox  Full                                                        +---------+---------------+---------+-----------+----------+--------------+ FV Mid   Full                                                        +---------+---------------+---------+-----------+----------+--------------+  FV DistalFull                                                        +---------+---------------+---------+-----------+----------+--------------+ PFV      Full                                                        +---------+---------------+---------+-----------+----------+--------------+ POP      Full           Yes      Yes                                 +---------+---------------+---------+-----------+----------+--------------+ PTV      Full                                                         +---------+---------------+---------+-----------+----------+--------------+ PERO     Full                                                        +---------+---------------+---------+-----------+----------+--------------+     Summary: RIGHT: - There is no evidence of deep vein thrombosis in the lower extremity.  - No cystic structure found in the popliteal fossa.  LEFT: - There is no evidence of deep vein thrombosis in the lower extremity.  - No cystic structure found in the popliteal fossa.  *See table(s) above for measurements and observations. Electronically signed by Ruta Hinds MD on 01/10/2020 at 10:04:29 AM.    Final      Subjective: No new complaints.   Discharge Exam: Vitals:   01/12/20 2000 01/13/20 0742  BP: (!) 135/57 (!) 143/66  Pulse: 80 63  Resp: 20 18  Temp: 97.7 F (36.5 C) 98.3 F (36.8 C)  SpO2: 97% 94%   Vitals:   01/12/20 1503 01/12/20 1656 01/12/20 2000 01/13/20 0742  BP: 127/61 (!) 124/52 (!) 135/57 (!) 143/66  Pulse: 80 77 80 63  Resp: 13 16 20 18   Temp:  98.2 F (36.8 C) 97.7 F (36.5 C) 98.3 F (36.8 C)  TempSrc:   Oral   SpO2: 95% 99% 97% 94%  Weight:      Height:        General: Pt is alert, awake, not in acute distress Cardiovascular: RRR, S1/S2 +, no rubs, no gallops Respiratory: CTA bilaterally, no wheezing, no rhonchi Abdominal: Soft, NT, ND, bowel sounds + Extremities: no edema, no cyanosis    The results of significant diagnostics from this hospitalization (including imaging, microbiology, ancillary and laboratory) are listed below for reference.     Microbiology: Recent Results (from the past 240 hour(s))  SARS Coronavirus 2 by RT PCR (hospital order, performed in [facility] hospital lab) Nasopharyngeal Nasopharyngeal Swab     Status: None  Collection Time: 01/08/20  4:28 PM   Specimen: Nasopharyngeal Swab  Result Value Ref Range Status   SARS Coronavirus 2 NEGATIVE NEGATIVE Final    Comment: (NOTE) SARS-CoV-2 target  nucleic acids are NOT DETECTED.  The SARS-CoV-2 RNA is generally detectable in upper and lower respiratory specimens during the acute phase of infection. The lowest concentration of SARS-CoV-2 viral copies this assay can detect is 250 copies / mL. A negative result does not preclude SARS-CoV-2 infection and should not be used as the sole basis for treatment or other patient management decisions.  A negative result may occur with improper specimen collection / handling, submission of specimen other than nasopharyngeal swab, presence of viral mutation(s) within the areas targeted by this assay, and inadequate number of viral copies (<250 copies / mL). A negative result must be combined with clinical observations, patient history, and epidemiological information.  Fact Sheet for Patients:   StrictlyIdeas.no  Fact Sheet for Healthcare Providers: BankingDealers.co.za  This test is not yet approved or  cleared by the [place] FDA and has been authorized for detection and/or diagnosis of SARS-CoV-2 by FDA under an Emergency Use Authorization (EUA).  This EUA will remain in effect (meaning this test can be used) for the duration of the COVID-19 declaration under Section 564(b)(1) of the Act, 21 U.S.C. section 360bbb-3(b)(1), unless the authorization is terminated or revoked sooner.  Performed at [facility], [address] [place], [place] [address]      Labs: BNP (last 3 results) No results for input(s): BNP in the last 8760 hours. Basic Metabolic Panel: Recent Labs  Lab 01/08/20 1316 01/10/20 0343 01/11/20 2012  NA 135 137 136  K 4.7 4.3 4.9  CL 101 102 101  CO2 26 26 28   GLUCOSE 103* 104* 112*  BUN 23 24* 23  CREATININE 0.89 1.15* 1.12*  CALCIUM 10.2 10.0 9.8   Liver Function Tests: Recent Labs  Lab 01/08/20 1316 01/10/20 0343 01/11/20 2012  AST 15 18 16   ALT 18 16 16   ALKPHOS 243* 223* 221*   BILITOT 0.7 1.0 0.6  PROT 7.8 6.5 6.7  ALBUMIN 3.9 3.3* 3.4*   No results for input(s): LIPASE, AMYLASE in the last 168 hours. No results for input(s): AMMONIA in the last 168 hours. CBC: Recent Labs  Lab 01/08/20 1316 01/11/20 2012  WBC 8.1 8.4  NEUTROABS 4.8  --   HGB 14.6 13.2  HCT 45.3 41.8  MCV 87.8 88.6  PLT 262 251   Cardiac Enzymes: No results for input(s): CKTOTAL, CKMB, CKMBINDEX, TROPONINI in the last 168 hours. BNP: Invalid input(s): POCBNP CBG: Recent Labs  Lab 01/08/20 1347 01/12/20 0843  GLUCAP 96 146*   D-Dimer No results for input(s): DDIMER in the last 72 hours. Hgb A1c No results for input(s): HGBA1C in the last 72 hours. Lipid Profile No results for input(s): CHOL, HDL, LDLCALC, TRIG, CHOLHDL, LDLDIRECT in the last 72 hours. Thyroid function studies No results for input(s): TSH, T4TOTAL, T3FREE, THYROIDAB in the last 72 hours.  Invalid input(s): FREET3 Anemia work up No results for input(s): VITAMINB12, FOLATE, FERRITIN, TIBC, IRON, RETICCTPCT in the last 72 hours. Urinalysis    Component Value Date/Time   COLORURINE YELLOW 11/13/2019 [place] 11/13/2019 0540   LABSPEC 1.015 11/13/2019 0540   PHURINE 5.0 11/13/2019 [place] 11/13/2019 0540   HGBUR NEGATIVE 11/13/2019 [place] NEGATIVE 11/13/2019 0540   KETONESUR NEGATIVE 11/13/2019 0540  PROTEINUR NEGATIVE 11/13/2019 0540   NITRITE NEGATIVE 11/13/2019 0540   LEUKOCYTESUR SMALL (A) 11/13/2019 0540   Sepsis Labs Invalid input(s): PROCALCITONIN,  WBC,  LACTICIDVEN Microbiology Recent Results (from the past 240 hour(s))  SARS Coronavirus 2 by RT PCR (hospital order, performed in [facility] hospital lab) Nasopharyngeal Nasopharyngeal Swab     Status: None   Collection Time: 01/08/20  4:28 PM   Specimen: Nasopharyngeal Swab  Result Value Ref Range Status   SARS Coronavirus 2 NEGATIVE NEGATIVE Final    Comment: (NOTE) SARS-CoV-2 target  nucleic acids are NOT DETECTED.  The SARS-CoV-2 RNA is generally detectable in upper and lower respiratory specimens during the acute phase of infection. The lowest concentration of SARS-CoV-2 viral copies this assay can detect is 250 copies / mL. A negative result does not preclude SARS-CoV-2 infection and should not be used as the sole basis for treatment or other patient management decisions.  A negative result may occur with improper specimen collection / handling, submission of specimen other than nasopharyngeal swab, presence of viral mutation(s) within the areas targeted by this assay, and inadequate number of viral copies (<250 copies / mL). A negative result must be combined with clinical observations, patient history, and epidemiological information.  Fact Sheet for Patients:   StrictlyIdeas.no  Fact Sheet for Healthcare Providers: BankingDealers.co.za  This test is not yet approved or  cleared by the [place] FDA and has been authorized for detection and/or diagnosis of SARS-CoV-2 by FDA under an Emergency Use Authorization (EUA).  This EUA will remain in effect (meaning this test can be used) for the duration of the COVID-19 declaration under Section 564(b)(1) of the Act, 21 U.S.C. section 360bbb-3(b)(1), unless the authorization is terminated or revoked sooner.  Performed at [facility], [address] [place], [place] [address]      Time coordinating discharge: 32 minutes  SIGNED:   Hosie Poisson, MD  [facility] 01/15/2020, 8:48 AM

## 2020-01-15 NOTE — Telephone Encounter (Signed)
I updated Dr. Julien Nordmann on Ms. Berhow's newly dx lung cancer.  He will see patient on 9/13 but would like her tissue to be sent for [facility] and PDL 1 test.  I requested pathology to send recent bx.

## 2020-01-18 ENCOUNTER — Other Ambulatory Visit: Payer: Self-pay

## 2020-01-18 ENCOUNTER — Encounter: Payer: Self-pay | Admitting: Internal Medicine

## 2020-01-18 ENCOUNTER — Encounter: Payer: Self-pay | Admitting: *Deleted

## 2020-01-18 ENCOUNTER — Inpatient Hospital Stay (HOSPITAL_BASED_OUTPATIENT_CLINIC_OR_DEPARTMENT_OTHER): Payer: Medicare HMO | Admitting: Internal Medicine

## 2020-01-18 ENCOUNTER — Inpatient Hospital Stay: Payer: Medicare HMO | Attending: Internal Medicine

## 2020-01-18 ENCOUNTER — Ambulatory Visit: Payer: Medicare HMO | Admitting: Physical Medicine and Rehabilitation

## 2020-01-18 ENCOUNTER — Ambulatory Visit
Admission: RE | Admit: 2020-01-18 | Discharge: 2020-01-18 | Disposition: A | Discharge status: Home / Self Care | Payer: Medicare HMO | Source: Ambulatory Visit | Attending: Radiation Oncology | Admitting: Radiation Oncology

## 2020-01-18 ENCOUNTER — Inpatient Hospital Stay: Payer: Medicare HMO

## 2020-01-18 DIAGNOSIS — Z7982 Long term (current) use of aspirin: Secondary | ICD-10-CM | POA: Insufficient documentation

## 2020-01-18 DIAGNOSIS — C7951 Secondary malignant neoplasm of bone: Secondary | ICD-10-CM

## 2020-01-18 DIAGNOSIS — C3432 Malignant neoplasm of lower lobe, left bronchus or lung: Secondary | ICD-10-CM | POA: Diagnosis not present

## 2020-01-18 DIAGNOSIS — F419 Anxiety disorder, unspecified: Secondary | ICD-10-CM

## 2020-01-18 DIAGNOSIS — Z90710 Acquired absence of both cervix and uterus: Secondary | ICD-10-CM | POA: Insufficient documentation

## 2020-01-18 DIAGNOSIS — Z510 Encounter for antineoplastic radiation therapy: Secondary | ICD-10-CM | POA: Diagnosis not present

## 2020-01-18 DIAGNOSIS — E039 Hypothyroidism, unspecified: Secondary | ICD-10-CM | POA: Insufficient documentation

## 2020-01-18 DIAGNOSIS — C7801 Secondary malignant neoplasm of right lung: Secondary | ICD-10-CM

## 2020-01-18 DIAGNOSIS — Z79899 Other long term (current) drug therapy: Secondary | ICD-10-CM | POA: Insufficient documentation

## 2020-01-18 DIAGNOSIS — C7931 Secondary malignant neoplasm of brain: Secondary | ICD-10-CM | POA: Insufficient documentation

## 2020-01-18 DIAGNOSIS — C3490 Malignant neoplasm of unspecified part of unspecified bronchus or lung: Secondary | ICD-10-CM

## 2020-01-18 DIAGNOSIS — Z8673 Personal history of transient ischemic attack (TIA), and cerebral infarction without residual deficits: Secondary | ICD-10-CM | POA: Diagnosis not present

## 2020-01-18 DIAGNOSIS — R69 Illness, unspecified: Secondary | ICD-10-CM | POA: Diagnosis not present

## 2020-01-18 DIAGNOSIS — Z5111 Encounter for antineoplastic chemotherapy: Secondary | ICD-10-CM

## 2020-01-18 DIAGNOSIS — R918 Other nonspecific abnormal finding of lung field: Secondary | ICD-10-CM

## 2020-01-18 DIAGNOSIS — I10 Essential (primary) hypertension: Secondary | ICD-10-CM | POA: Insufficient documentation

## 2020-01-18 DIAGNOSIS — R110 Nausea: Secondary | ICD-10-CM | POA: Insufficient documentation

## 2020-01-18 DIAGNOSIS — C7802 Secondary malignant neoplasm of left lung: Secondary | ICD-10-CM | POA: Insufficient documentation

## 2020-01-18 DIAGNOSIS — Z7952 Long term (current) use of systemic steroids: Secondary | ICD-10-CM | POA: Diagnosis not present

## 2020-01-18 DIAGNOSIS — K219 Gastro-esophageal reflux disease without esophagitis: Secondary | ICD-10-CM

## 2020-01-18 DIAGNOSIS — C3492 Malignant neoplasm of unspecified part of left bronchus or lung: Secondary | ICD-10-CM | POA: Insufficient documentation

## 2020-01-18 DIAGNOSIS — Z8249 Family history of ischemic heart disease and other diseases of the circulatory system: Secondary | ICD-10-CM | POA: Insufficient documentation

## 2020-01-18 DIAGNOSIS — E785 Hyperlipidemia, unspecified: Secondary | ICD-10-CM | POA: Diagnosis not present

## 2020-01-18 DIAGNOSIS — G4730 Sleep apnea, unspecified: Secondary | ICD-10-CM | POA: Insufficient documentation

## 2020-01-18 DIAGNOSIS — Z7189 Other specified counseling: Secondary | ICD-10-CM

## 2020-01-18 HISTORY — DX: Other specified counseling: Z71.89

## 2020-01-18 HISTORY — DX: Encounter for antineoplastic chemotherapy: Z51.11

## 2020-01-18 HISTORY — DX: Malignant neoplasm of unspecified part of left bronchus or lung: C34.92

## 2020-01-18 LAB — CMP (CANCER CENTER ONLY)
ALT: 18 U/L (ref 0–44)
AST: 16 U/L (ref 15–41)
Albumin: 3.2 g/dL — ABNORMAL LOW (ref 3.5–5.0)
Alkaline Phosphatase: 304 U/L — ABNORMAL HIGH (ref 38–126)
Anion gap: 8 (ref 5–15)
BUN: 18 mg/dL (ref 8–23)
CO2: 25 mmol/L (ref 22–32)
Calcium: 10 mg/dL (ref 8.9–10.3)
Chloride: 105 mmol/L (ref 98–111)
Creatinine: 0.89 mg/dL (ref 0.44–1.00)
GFR, Est AFR Am: >60 mL/min (ref >60)
GFR, Estimated: >60 mL/min (ref >60)
Glucose, Bld: 130 mg/dL — ABNORMAL HIGH (ref 70–99)
Potassium: 4.2 mmol/L (ref 3.5–5.1)
Sodium: 138 mmol/L (ref 135–145)
Total Bilirubin: 0.5 mg/dL (ref 0.3–1.2)
Total Protein: 7.2 g/dL (ref 6.5–8.1)

## 2020-01-18 LAB — CBC WITH DIFFERENTIAL (CANCER CENTER ONLY)
Abs Immature Granulocytes: 0.02 10*3/uL (ref 0.00–0.07)
Basophils Absolute: 0 10*3/uL (ref 0.0–0.1)
Basophils Relative: 0 %
Eosinophils Absolute: 0.3 10*3/uL (ref 0.0–0.5)
Eosinophils Relative: 4 %
HCT: 41.5 % (ref 36.0–46.0)
Hemoglobin: 13.6 g/dL (ref 12.0–15.0)
Immature Granulocytes: 0 %
Lymphocytes Relative: 24 %
Lymphs Abs: 1.9 10*3/uL (ref 0.7–4.0)
MCH: 28.3 pg (ref 26.0–34.0)
MCHC: 32.8 g/dL (ref 30.0–36.0)
MCV: 86.5 fL (ref 80.0–100.0)
Monocytes Absolute: 0.5 10*3/uL (ref 0.1–1.0)
Monocytes Relative: 7 %
Neutro Abs: 5.1 10*3/uL (ref 1.7–7.7)
Neutrophils Relative %: 65 %
Platelet Count: 272 10*3/uL (ref 150–400)
RBC: 4.8 MIL/uL (ref 3.87–5.11)
RDW: 13.1 % (ref 11.5–15.5)
WBC Count: 7.9 10*3/uL (ref 4.0–10.5)
nRBC: 0 % (ref 0.0–0.2)

## 2020-01-18 NOTE — Progress Notes (Signed)
[place] Telephone:([phone]   [phone]  CONSULT NOTE  REFERRING PHYSICIAN: Dr. Leory Plowman Icard  REASON FOR CONSULTATION:  74 years old white female recently diagnosed with lung cancer.  HPI Stefanie Camacho is a 74 y.o. female with past medical history significant for an anxiety, hypertension, dyslipidemia, hypothyroidism, GERD as well as cardiomyopathy and sleep apnea.  The patient is a never smoker.  She presented to the emergency department on 01/08/2020 for evaluation after 2 days of slurred speech and then right facial numbness.  She had CT scan of the head as well as MRI of the brain without contrast that showed a small amount of hemorrhage likely subacute within the left occipital lobe.  There were areas of hyperintense T2 weighted signal within the left temporal lobe suspicious to be due to prior left PCA ischemic events.  During the work-up for stroke she had CT angiogram of the neck and head and it showed no emergent large vessel occlusion or high-grade stenosis of the intracranial arteries but there was innumerable nodular pulmonary metastasis seen.  This was followed by CT scan of the chest, abdomen and pelvis on 01/10/2020 and it showed a dominant mass in the left lower lobe consistent with primary bronchogenic carcinoma and it measured 7.8 x 4.2 x 4.8 cm.  There was extensive metastatic disease to both lungs with numerous lung nodules throughout all lobes.  There was also 2.0 x 1.1 cm soft tissue mass versus a mildly enlarged lymph node in the anterior mediastinum anterior to the heart and possibly metastatic disease.  There was also skeletal metastatic disease with lucent bone lesions the most definitive seen within the right T5 transverse process, the sacrum, right ilium and left anterior acetabulum/pubis.  No other evidence of metastatic disease.  The scan also showed 3.0 cm right liver lobe hemangioma.  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  There was also 1.6 cm right frontoparietal skull metastasis.  The patient was seen by Dr. Valeta Harms and she underwent video bronchoscopy with endobronchial ultrasound procedure on 01/12/2020. The final pathology [id number]) was consistent with adenocarcinoma with focal micropapillary features. The patient was seen by Dr. 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  She was referred to me today for evaluation and recommendation regarding treatment of her condition.  When seen today she continues to have pain on the right hip as well as the groin area and pain in the middle of her back.  She has intermittent headache mainly on the right temporal area.  She also lost around 5 pounds in the last month.  She has no chest pain but occasional tightness in the middle of her chest with mild shortness of breath and cough with no hemoptysis.  The patient denied having any nausea, vomiting, diarrhea or constipation. Family history significant for mother and father with heart disease and stroke. The patient is divorced and has 4 children, 3 sons and 1 daughter.  She used to work in [employer] (medical).  She was accompanied today by her Sister Stefanie Camacho.  The patient has no history of smoking but used to drink alcohol occasionally and no history of drug abuse.  HPI  Past Medical History:  Diagnosis Date  . Anxiety   . Dyslipidemia   . Essential hypertension   . GERD (gastroesophageal reflux disease)   . Hypothyroidism   .  Left bundle branch block 06/04/2018  . NICM (nonischemic cardiomyopathy) ([place])   . Nonischemic cardiomyopathy ([place]) 06/04/2018   Ejection fraction 45% in summer 2019  . Sleep apnea     Past Surgical History:  Procedure Laterality Date  . ABDOMINAL HYSTERECTOMY    . BLADDER NECK RECONSTRUCTION    . BRONCHIAL BIOPSY  01/12/2020    Procedure: BRONCHIAL BIOPSIES;  Surgeon: Garner Nash, DO;  Location: [facility];  Service: Pulmonary;;  . BRONCHIAL BRUSHINGS  01/12/2020   Procedure: BRONCHIAL BRUSHINGS;  Surgeon: Garner Nash, DO;  Location: [facility];  Service: Pulmonary;;  . BRONCHIAL NEEDLE ASPIRATION BIOPSY  01/12/2020   Procedure: BRONCHIAL NEEDLE ASPIRATION BIOPSIES;  Surgeon: Garner Nash, DO;  Location: [facility];  Service: Pulmonary;;  . BRONCHIAL WASHINGS  01/12/2020   Procedure: BRONCHIAL WASHINGS;  Surgeon: Garner Nash, DO;  Location: [facility];  Service: Pulmonary;;  . SHOULDER SURGERY    . TONSILLECTOMY    . VIDEO BRONCHOSCOPY WITH ENDOBRONCHIAL ULTRASOUND  01/12/2020   Procedure: VIDEO BRONCHOSCOPY WITH ENDOBRONCHIAL ULTRASOUND;  Surgeon: Garner Nash, DO;  Location: [facility];  Service: Pulmonary;;    Family History  Problem Relation Age of Onset  . Heart disease Mother   . Stroke Mother   . Heart disease Father   . Stroke Father   . Heart disease Maternal Grandfather   . Heart disease Paternal Grandfather     Social History Social History   Tobacco Use  . Smoking status: Never Smoker  . Smokeless tobacco: Never Used  Vaping Use  . Vaping Use: Never used  Substance Use Topics  . Alcohol use: Not Currently  . Drug use: Never    No Known Allergies  Current Outpatient Medications  Medication Sig Dispense Refill  . aspirin EC 81 MG tablet Take 81 mg by mouth daily.    Marland Kitchen atorvastatin (LIPITOR) 10 MG tablet Take 1 tablet (10 mg total) by mouth daily at 6 PM. 30 tablet 2  . FLUoxetine (PROZAC) 10 MG tablet Take 1 tablet (10 mg total) by mouth daily. 30 tablet 3  . levETIRAcetam (KEPPRA) 500 MG tablet Take 1 tablet (500 mg total) by mouth 2 (two) times daily. 60 tablet 1  . levothyroxine (SYNTHROID) 75 MCG tablet Take 1 tablet (75 mcg total) by mouth daily. 30 tablet 1  . losartan (COZAAR) 50 MG tablet Take 1 tablet (50 mg total) by mouth daily. 90 tablet 2  .  metoprolol succinate (TOPROL-XL) 50 MG 24 hr tablet Take 1 tablet (50 mg total) by mouth daily. Take with or immediately following a meal. 90 tablet 2  . pantoprazole (PROTONIX) 40 MG tablet Take 1 tablet (40 mg total) by mouth daily. 30 tablet 1  . spironolactone (ALDACTONE) 25 MG tablet Take 0.5 tablets (12.5 mg total) by mouth daily. 45 tablet 3   No current facility-administered medications for this visit.    Review of Systems  Constitutional: positive for fatigue and weight loss Eyes: negative Ears, nose, mouth, throat, and face: negative Respiratory: positive for cough, dyspnea on exertion and pleurisy/chest pain Cardiovascular: negative Gastrointestinal: negative Genitourinary:negative Integument/breast: negative Hematologic/lymphatic: negative Musculoskeletal:positive for back pain and bone pain Neurological: negative Behavioral/Psych: negative Endocrine: negative Allergic/Immunologic: negative  Physical Exam  IOE:VOJJK, healthy, no distress, well nourished, well developed and anxious SKIN: skin color, texture, turgor are normal, no rashes or significant lesions HEAD: Normocephalic, No masses, lesions, tenderness or abnormalities EYES: normal, PERRLA, Conjunctiva are pink and non-injected EARS: External ears  normal, Canals clear OROPHARYNX:no exudate, no erythema and lips, buccal mucosa, and tongue normal  NECK: supple, no adenopathy, no JVD LYMPH:  no palpable lymphadenopathy, no hepatosplenomegaly BREAST:not examined LUNGS: clear to auscultation , and palpation HEART: regular rate & rhythm, no murmurs and no gallops ABDOMEN:abdomen soft, non-tender, normal bowel sounds and no masses or organomegaly BACK: No CVA tenderness, Range of motion is normal EXTREMITIES:no joint deformities, effusion, or inflammation, no edema  NEURO: alert & oriented x 3 with fluent speech, no focal motor/sensory deficits  PERFORMANCE STATUS: ECOG 1  LABORATORY DATA: Lab Results   Component Value Date   WBC 8.4 01/11/2020   HGB 13.2 01/11/2020   HCT 41.8 01/11/2020   MCV 88.6 01/11/2020   PLT 251 01/11/2020      Chemistry      Component Value Date/Time   NA 136 01/11/2020 2012   K 4.9 01/11/2020 2012   CL 101 01/11/2020 2012   CO2 28 01/11/2020 2012   BUN 23 01/11/2020 2012   CREATININE 1.12 (H) 01/11/2020 2012      Component Value Date/Time   CALCIUM 9.8 01/11/2020 2012   ALKPHOS 221 (H) 01/11/2020 2012   AST 16 01/11/2020 2012   ALT 16 01/11/2020 2012   BILITOT 0.6 01/11/2020 2012   BILITOT 0.6 07/17/2018 0934       RADIOGRAPHIC STUDIES: CT ANGIO HEAD W OR WO CONTRAST  Result Date: 01/09/2020 CLINICAL DATA:  Stroke EXAM: CT ANGIOGRAPHY HEAD AND NECK TECHNIQUE: Multidetector CT imaging of the head and neck was performed using the standard protocol during bolus administration of intravenous contrast. Multiplanar CT image reconstructions and MIPs were obtained to evaluate the vascular anatomy. Carotid stenosis measurements (when applicable) are obtained utilizing NASCET criteria, using the distal internal carotid diameter as the denominator. CONTRAST:  18mL OMNIPAQUE IOHEXOL 350 MG/ML SOLN COMPARISON:  None. FINDINGS: CTA NECK FINDINGS SKELETON: There is no bony spinal canal stenosis. No lytic or blastic lesion. OTHER NECK: Normal pharynx, larynx and major salivary glands. No cervical lymphadenopathy. Unremarkable thyroid gland. UPPER CHEST: Innumerable nodules throughout both lungs. AORTIC ARCH: There is no calcific atherosclerosis of the aortic arch. There is no aneurysm, dissection or hemodynamically significant stenosis of the visualized portion of the aorta. Normal variant aortic arch branching pattern with the brachiocephalic and left common carotid arteries sharing a common origin. The visualized proximal subclavian arteries are widely patent. RIGHT CAROTID SYSTEM: Normal without aneurysm, dissection or stenosis. LEFT CAROTID SYSTEM: Normal without  aneurysm, dissection or stenosis. VERTEBRAL ARTERIES: Left dominant configuration. Both origins are clearly patent. There is no dissection, occlusion or flow-limiting stenosis to the skull base (V1-V3 segments). CTA HEAD FINDINGS POSTERIOR CIRCULATION: --Vertebral arteries: Normal V4 segments. --Inferior cerebellar arteries: Normal. --Basilar artery: Normal. --Superior cerebellar arteries: Normal. --Posterior cerebral arteries (PCA): Normal. ANTERIOR CIRCULATION: --Intracranial internal carotid arteries: Normal. --Anterior cerebral arteries (ACA): Normal. Both A1 segments are present. Patent anterior communicating artery (a-comm). --Middle cerebral arteries (MCA): Normal. VENOUS SINUSES: As permitted by contrast timing, patent. ANATOMIC VARIANTS: None Review of the MIP images confirms the above findings. IMPRESSION: 1. No emergent large vessel occlusion or high-grade stenosis of the intracranial arteries. 2. Innumerable nodular pulmonary metastases. Electronically Signed   By: Ulyses Jarred M.D.   On: 01/09/2020 05:17   CT HEAD WO CONTRAST  Result Date: 01/08/2020 CLINICAL DATA:  TIA.  Right facial numbness 2 days ago. EXAM: CT HEAD WITHOUT CONTRAST TECHNIQUE: Contiguous axial images were obtained from the base of the skull through the vertex  without intravenous contrast. COMPARISON:  None. FINDINGS: Brain: No evidence of acute infarction, hemorrhage, hydrocephalus, extra-axial collection or mass lesion/mass effect. Vascular: Negative for hyperdense vessel Skull: Negative Sinuses/Orbits: Paranasal sinuses clear. Bilateral cataract extraction. No orbital lesion. Other: None IMPRESSION: Negative CT head Electronically Signed   By: Franchot Gallo M.D.   On: 01/08/2020 13:50   CT ANGIO NECK W OR WO CONTRAST  Result Date: 01/09/2020 CLINICAL DATA:  Stroke EXAM: CT ANGIOGRAPHY HEAD AND NECK TECHNIQUE: Multidetector CT imaging of the head and neck was performed using the standard protocol during bolus administration  of intravenous contrast. Multiplanar CT image reconstructions and MIPs were obtained to evaluate the vascular anatomy. Carotid stenosis measurements (when applicable) are obtained utilizing NASCET criteria, using the distal internal carotid diameter as the denominator. CONTRAST:  44mL OMNIPAQUE IOHEXOL 350 MG/ML SOLN COMPARISON:  None. FINDINGS: CTA NECK FINDINGS SKELETON: There is no bony spinal canal stenosis. No lytic or blastic lesion. OTHER NECK: Normal pharynx, larynx and major salivary glands. No cervical lymphadenopathy. Unremarkable thyroid gland. UPPER CHEST: Innumerable nodules throughout both lungs. AORTIC ARCH: There is no calcific atherosclerosis of the aortic arch. There is no aneurysm, dissection or hemodynamically significant stenosis of the visualized portion of the aorta. Normal variant aortic arch branching pattern with the brachiocephalic and left common carotid arteries sharing a common origin. The visualized proximal subclavian arteries are widely patent. RIGHT CAROTID SYSTEM: Normal without aneurysm, dissection or stenosis. LEFT CAROTID SYSTEM: Normal without aneurysm, dissection or stenosis. VERTEBRAL ARTERIES: Left dominant configuration. Both origins are clearly patent. There is no dissection, occlusion or flow-limiting stenosis to the skull base (V1-V3 segments). CTA HEAD FINDINGS POSTERIOR CIRCULATION: --Vertebral arteries: Normal V4 segments. --Inferior cerebellar arteries: Normal. --Basilar artery: Normal. --Superior cerebellar arteries: Normal. --Posterior cerebral arteries (PCA): Normal. ANTERIOR CIRCULATION: --Intracranial internal carotid arteries: Normal. --Anterior cerebral arteries (ACA): Normal. Both A1 segments are present. Patent anterior communicating artery (a-comm). --Middle cerebral arteries (MCA): Normal. VENOUS SINUSES: As permitted by contrast timing, patent. ANATOMIC VARIANTS: None Review of the MIP images confirms the above findings. IMPRESSION: 1. No emergent large  vessel occlusion or high-grade stenosis of the intracranial arteries. 2. Innumerable nodular pulmonary metastases. Electronically Signed   By: Ulyses Jarred M.D.   On: 01/09/2020 05:17   MR ANGIO HEAD WO CONTRAST  Result Date: 01/09/2020 CLINICAL DATA:  Acute neurologic deficit. Slurred speech and facial numbness EXAM: MRI HEAD WITHOUT CONTRAST MRA HEAD WITHOUT CONTRAST TECHNIQUE: Multiplanar, multiecho pulse sequences of the brain and surrounding structures were obtained without intravenous contrast. Angiographic images of the head were obtained using MRA technique without contrast. COMPARISON:  None. FINDINGS: MRI HEAD FINDINGS Brain: There is a small amount of hemorrhage, likely subacute, within the left occipital lobe. It is unclear whether this is subarachnoid or intraparenchymal within the cortex. There are areas of hyperintense T2-weighted signal within the left temporal lobe. Normal volume of CSF spaces. No chronic microhemorrhage. Normal midline structures. Vascular: Normal flow voids. Skull and upper cervical spine: Normal marrow signal. Sinuses/Orbits: Negative. Other: None. MRA HEAD FINDINGS POSTERIOR CIRCULATION: --Vertebral arteries: Normal V4 segments. --Inferior cerebellar arteries: Normal. --Basilar artery: Normal. --Superior cerebellar arteries: Normal. --Posterior cerebral arteries: Multifocal stenosis of the distal left PCA. Normal right PCA. ANTERIOR CIRCULATION: --Intracranial internal carotid arteries: Normal. --Anterior cerebral arteries (ACA): Normal. Both A1 segments are present. Patent anterior communicating artery (a-comm). --Middle cerebral arteries (MCA): Normal. IMPRESSION: 1. Small amount of hemorrhage, likely subacute, within the left occipital lobe. Given the multifocal left PCA stenosis, this  probably represents hemorrhage associated with a recent infarct. 2. Areas of hyperintense T2-weighted signal within the left temporal lobe may also be due to prior left PCA ischemic events.  3. Multifocal stenosis of the distal left PCA. Otherwise normal intracranial MRA. Electronically Signed   By: Ulyses Jarred M.D.   On: 01/09/2020 02:00   MR BRAIN WO CONTRAST  Result Date: 01/09/2020 CLINICAL DATA:  Acute neurologic deficit. Slurred speech and facial numbness EXAM: MRI HEAD WITHOUT CONTRAST MRA HEAD WITHOUT CONTRAST TECHNIQUE: Multiplanar, multiecho pulse sequences of the brain and surrounding structures were obtained without intravenous contrast. Angiographic images of the head were obtained using MRA technique without contrast. COMPARISON:  None. FINDINGS: MRI HEAD FINDINGS Brain: There is a small amount of hemorrhage, likely subacute, within the left occipital lobe. It is unclear whether this is subarachnoid or intraparenchymal within the cortex. There are areas of hyperintense T2-weighted signal within the left temporal lobe. Normal volume of CSF spaces. No chronic microhemorrhage. Normal midline structures. Vascular: Normal flow voids. Skull and upper cervical spine: Normal marrow signal. Sinuses/Orbits: Negative. Other: None. MRA HEAD FINDINGS POSTERIOR CIRCULATION: --Vertebral arteries: Normal V4 segments. --Inferior cerebellar arteries: Normal. --Basilar artery: Normal. --Superior cerebellar arteries: Normal. --Posterior cerebral arteries: Multifocal stenosis of the distal left PCA. Normal right PCA. ANTERIOR CIRCULATION: --Intracranial internal carotid arteries: Normal. --Anterior cerebral arteries (ACA): Normal. Both A1 segments are present. Patent anterior communicating artery (a-comm). --Middle cerebral arteries (MCA): Normal. IMPRESSION: 1. Small amount of hemorrhage, likely subacute, within the left occipital lobe. Given the multifocal left PCA stenosis, this probably represents hemorrhage associated with a recent infarct. 2. Areas of hyperintense T2-weighted signal within the left temporal lobe may also be due to prior left PCA ischemic events. 3. Multifocal stenosis of the distal  left PCA. Otherwise normal intracranial MRA. Electronically Signed   By: Ulyses Jarred M.D.   On: 01/09/2020 02:00   MR BRAIN W CONTRAST  Result Date: 01/10/2020 CLINICAL DATA:  Non-small cell lung cancer staging. EXAM: MRI HEAD WITH CONTRAST TECHNIQUE: Multiplanar, multiecho pulse sequences of the brain and surrounding structures were obtained with intravenous contrast. CONTRAST:  23mL GADAVIST GADOBUTROL 1 MMOL/ML IV SOLN COMPARISON:  Noncontrast head MRI 01/09/2020 FINDINGS: There are numerous enhancing lesions in both cerebral hemispheres with the largest measuring 1.4 cm in the left temporal lobe (series 5, image 19). There is minimal edema associated with this lesion as well as with a slightly smaller lesion more posteriorly at the left temporo-occipital junction. There are also multiple areas of abnormal leptomeningeal enhancement bilaterally, greatest in the left occipital region. No enhancing lesions are identified in the posterior fossa. An enhancing right frontoparietal skull lesion measures 1.6 cm (series 5, image 45). IMPRESSION: 1. 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 2. 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 Electronically Signed   By: Logan Bores M.D.   On: 01/10/2020 16:35   CT CHEST ABDOMEN PELVIS W CONTRAST  Result Date: 01/10/2020 CLINICAL DATA:  Further evaluation of lung nodules noted at the lung apices on the CTA neck from yesterday. Unintentional weight loss. EXAM: CT CHEST, ABDOMEN, AND PELVIS WITH CONTRAST TECHNIQUE: Multidetector CT imaging of the chest, abdomen and pelvis was performed following the standard protocol during bolus administration of intravenous contrast. CONTRAST:  122mL OMNIPAQUE IOHEXOL 300 MG/ML  SOLN COMPARISON:  CTA neck, 01/09/2020. FINDINGS: CT CHEST FINDINGS Cardiovascular: Heart is normal in size and configuration. No pericardial effusion. No coronary artery calcifications. Great vessels  are normal  in caliber. Minimal aortic atherosclerosis along the descending portion. Branch vessels are widely patent. Mediastinum/Nodes: Masslike soft tissue along the anterior mediastinum anterior to the heart, 2.0 x 1.1 cm in size, possibly a mildly enlarged lymph node. No other mediastinal masses. No other evidence of an enlarged lymph node. No neck base or axillary masses or adenopathy. Trachea and esophagus are unremarkable. Lungs/Pleura: Left lower lobe mass with ill-defined margins, measuring approximately 7.8 cm from superior to inferior by 4.8 x 4.2 cm transversely. There are numerous bilateral pulmonary nodules consistent with widespread metastatic disease throughout both lungs. No evidence of pneumonia or pulmonary edema. No pleural effusion or pneumothorax. Musculoskeletal: Lucent bone lesions. The best defined arises from the transverse process of T5 on the right. Chest wall: Small nodule/mass in the right breast measuring 7 mm. CT ABDOMEN PELVIS FINDINGS Hepatobiliary: 3 cm mass in the peripheral aspect of the right liver lobe demonstrating peripheral nodular enhancement consistent with a hemangioma. No other liver masses or lesions. Normal gallbladder. No bile duct dilation. Pancreas: Unremarkable. No pancreatic ductal dilatation or surrounding inflammatory changes. Spleen: Normal in size without focal abnormality. Adrenals/Urinary Tract: No adrenal masses. Kidneys are normal in size, orientation and position with symmetric enhancement and excretion. 4-5 mm low-attenuation oval mass in the midpole the right kidney, too small to fully characterize, but consistent with a cyst. 1 cm lower pole cyst from the right kidney. No other renal masses, no stones and no hydronephrosis. Normal ureters. Normal bladder. Stomach/Bowel: Normal stomach. Small bowel and colon are normal in caliber. No masses. No wall thickening or inflammation. Normal appendix visualized. Vascular/Lymphatic: No significant vascular  findings are present. No enlarged abdominal or pelvic lymph nodes. Reproductive: Status post hysterectomy. No adnexal masses. Other: No abdominal wall hernia.  No ascites. Musculoskeletal: There are lucent/lytic bone lesions. Lesions are noted in the anterior left acetabulum, at the base of the pubis, right ilium, upper sacrum and subtly along the lumbar spine. There is also an area of mixed sclerosis along the anterior right acetabulum. IMPRESSION: 1. Dominant mass in the left lower lobe consistent with a primary bronchogenic carcinoma. This measures approximately 7.8 x 4.2 x 4.8 cm. 2. Extensive metastatic disease to both lungs with numerous lung nodules throughout all lobes. 2.0 x 1.1 cm soft tissue mass versus a mildly enlarged lymph node in the anterior mediastinum anterior to the heart, possibly metastatic disease. 3. Skeletal metastatic disease with lucent bone lesions, the most definitively seen within the right T5 transverse process, the sacrum, right ilium and left anterior acetabulum/pubis. 4. No other evidence of metastatic disease. 5. No acute findings. 6. 3 cm right liver lobe hemangioma. Electronically Signed   By: Lajean Manes M.D.   On: 01/10/2020 10:10   DG CHEST PORT 1 VIEW  Result Date: 01/12/2020 CLINICAL DATA:  Status post bronchoscopy and biopsy today. EXAM: PORTABLE CHEST 1 VIEW COMPARISON:  CT chest, abdomen and pelvis 01/10/2020. FINDINGS: There is no pneumothorax after bronchoscopy. Innumerable bilateral pulmonary nodules and a left lower lobe mass are again seen. Heart size is normal. No pleural fluid. IMPRESSION: Negative for pneumothorax after bronchoscopy. Left lower lobe mass and innumerable pulmonary nodules as seen on prior CT. Electronically Signed   By: Inge Rise M.D.   On: 01/12/2020 15:03   EEG adult  Result Date: 01/12/2020 Lora Havens, MD     01/12/2020  1:46 PM Patient Name: Manroop Jakubowicz MRN: [id number] Epilepsy Attending: Lora Havens Referring  Physician/Provider: Dr. Eleonore Chiquito Date: 01/11/2020  Duration: 23.03 minutes Patient history: 74 year old female with transient right facial numbness.  EEG to evaluate for seizures. Level of alertness: Awake AEDs during EEG study: Keppra Technical aspects: This EEG study was done with scalp electrodes positioned according to the 10-20 International system of electrode placement. Electrical activity was acquired at a sampling rate of 500Hz  and reviewed with a high frequency filter of 70Hz  and a low frequency filter of 1Hz . EEG data were recorded continuously and digitally stored. Description: The posterior dominant rhythm consists of 10 Hz activity of moderate voltage (25-35 uV) seen predominantly in posterior head regions, symmetric and reactive to eye opening and eye closing. Hyperventilation and photic stimulation were not performed.   IMPRESSION: This study is within normal limits. No seizures or epileptiform discharges were seen throughout the recording. Lora Havens   ECHOCARDIOGRAM COMPLETE  Result Date: 01/09/2020    ECHOCARDIOGRAM REPORT   Patient Name:   LASHEKA KEMPNER Date of Exam: 01/09/2020 Medical Rec #:  [id number]     Height:       66.0 in Accession #:    [id number]    Weight:       193.6 lb Date of Birth:  December 22, 1945     BSA:          1.972 m Patient Age:    89 years      BP:           137/66 mmHg Patient Gender: F             HR:           59 bpm. Exam Location:  Inpatient Procedure: 2D Echo, Cardiac Doppler and Color Doppler Indications:    Stroke  History:        Patient has prior history of Echocardiogram examinations, most                 recent 07/07/2019. Arrythmias:LBBB; Risk Factors:Hypertension,                 Dyslipidemia and Sleep Apnea. Non-ischemic cardiomyopathy.  Sonographer:    Clayton Lefort RDCS (AE) Referring Phys: [place]  1. Left ventricular ejection fraction, by estimation, is 55 to 60%. The left ventricle has normal function. The left ventricle has no  regional wall motion abnormalities. There is mild left ventricular hypertrophy. Left ventricular diastolic parameters are consistent with Grade I diastolic dysfunction (impaired relaxation).  2. Right ventricular systolic function is normal. The right ventricular size is normal.  3. The mitral valve is grossly normal. Trivial mitral valve regurgitation.  4. The aortic valve is grossly normal. Aortic valve regurgitation is not visualized. No aortic stenosis is present. FINDINGS  Left Ventricle: Left ventricular ejection fraction, by estimation, is 55 to 60%. The left ventricle has normal function. The left ventricle has no regional wall motion abnormalities. The left ventricular internal cavity size was normal in size. There is  mild left ventricular hypertrophy. Left ventricular diastolic parameters are consistent with Grade I diastolic dysfunction (impaired relaxation). Right Ventricle: The right ventricular size is normal. No increase in right ventricular wall thickness. Right ventricular systolic function is normal. Left Atrium: Left atrial size was normal in size. Right Atrium: Right atrial size was normal in size. Pericardium: There is no evidence of pericardial effusion. Mitral Valve: The mitral valve is grossly normal. Trivial mitral valve regurgitation. MV peak gradient, 2.9 mmHg. The mean mitral valve gradient is 1.0 mmHg. Tricuspid Valve: The tricuspid valve is grossly normal. Tricuspid  valve regurgitation is not demonstrated. Aortic Valve: The aortic valve is grossly normal. Aortic valve regurgitation is not visualized. No aortic stenosis is present. Aortic valve mean gradient measures 3.0 mmHg. Aortic valve peak gradient measures 6.2 mmHg. Aortic valve area, by VTI measures 2.10 cm. Pulmonic Valve: The pulmonic valve was grossly normal. Pulmonic valve regurgitation is trivial. Aorta: The aortic root and ascending aorta are structurally normal, with no evidence of dilitation. IAS/Shunts: The atrial  septum is grossly normal.  LEFT VENTRICLE PLAX 2D LVIDd:         4.10 cm  Diastology LVIDs:         3.00 cm  LV e' lateral:   8.05 cm/s LV PW:         1.20 cm  LV E/e' lateral: 6.2 LV IVS:        1.20 cm  LV e' medial:    6.31 cm/s LVOT diam:     2.00 cm  LV E/e' medial:  7.9 LV SV:         50 LV SV Index:   25 LVOT Area:     3.14 cm  RIGHT VENTRICLE             IVC RV Basal diam:  3.00 cm     IVC diam: 0.90 cm RV S prime:     14.10 cm/s TAPSE (M-mode): 2.7 cm LEFT ATRIUM             Index       RIGHT ATRIUM           Index LA diam:        2.60 cm 1.32 cm/m  RA Area:     11.40 cm LA Vol (A2C):   35.9 ml 18.20 ml/m RA Volume:   21.90 ml  11.10 ml/m LA Vol (A4C):   32.8 ml 16.63 ml/m LA Biplane Vol: 36.6 ml 18.56 ml/m  AORTIC VALVE AV Area (Vmax):    1.98 cm AV Area (Vmean):   2.04 cm AV Area (VTI):     2.10 cm AV Vmax:           125.00 cm/s AV Vmean:          86.100 cm/s AV VTI:            0.238 m AV Peak Grad:      6.2 mmHg AV Mean Grad:      3.0 mmHg LVOT Vmax:         78.90 cm/s LVOT Vmean:        56.000 cm/s LVOT VTI:          0.159 m LVOT/AV VTI ratio: 0.67  AORTA Ao Root diam: 3.20 cm Ao Asc diam:  3.30 cm MITRAL VALVE MV Area (PHT): 2.45 cm    SHUNTS MV Peak grad:  2.9 mmHg    Systemic VTI:  0.16 m MV Mean grad:  1.0 mmHg    Systemic Diam: 2.00 cm MV Vmax:       0.85 m/s MV Vmean:      48.3 cm/s MV Decel Time: 310 msec MV E velocity: 50.10 cm/s MV A velocity: 74.10 cm/s MV E/A ratio:  0.68 Mertie Moores MD Electronically signed by Mertie Moores MD Signature Date/Time: 01/09/2020/1:34:44 PM    Final    VAS [place] LOWER EXTREMITY VENOUS (DVT)  Result Date: 01/10/2020  Lower Venous DVTStudy Indications: Embolic stroke.  Comparison Study: No prior studies. Performing Technologist: Darlin Coco  Examination Guidelines: A complete evaluation  includes B-mode imaging, spectral Doppler, color Doppler, and power Doppler as needed of all accessible portions of each vessel. Bilateral testing is considered an  integral part of a complete examination. Limited examinations for reoccurring indications may be performed as noted. The reflux portion of the exam is performed with the patient in reverse Trendelenburg.  +---------+---------------+---------+-----------+----------+--------------+ RIGHT    CompressibilityPhasicitySpontaneityPropertiesThrombus Aging +---------+---------------+---------+-----------+----------+--------------+ CFV      Full           Yes      Yes                                 +---------+---------------+---------+-----------+----------+--------------+ SFJ      Full                                                        +---------+---------------+---------+-----------+----------+--------------+ FV Prox  Full                                                        +---------+---------------+---------+-----------+----------+--------------+ FV Mid   Full                                                        +---------+---------------+---------+-----------+----------+--------------+ FV DistalFull                                                        +---------+---------------+---------+-----------+----------+--------------+ PFV      Full                                                        +---------+---------------+---------+-----------+----------+--------------+ POP      Full           Yes      Yes                                 +---------+---------------+---------+-----------+----------+--------------+ PTV      Full                                                        +---------+---------------+---------+-----------+----------+--------------+ PERO     Full                                                        +---------+---------------+---------+-----------+----------+--------------+   +---------+---------------+---------+-----------+----------+--------------+  LEFT     CompressibilityPhasicitySpontaneityPropertiesThrombus  Aging +---------+---------------+---------+-----------+----------+--------------+ CFV      Full           Yes      Yes                                 +---------+---------------+---------+-----------+----------+--------------+ SFJ      Full                                                        +---------+---------------+---------+-----------+----------+--------------+ FV Prox  Full                                                        +---------+---------------+---------+-----------+----------+--------------+ FV Mid   Full                                                        +---------+---------------+---------+-----------+----------+--------------+ FV DistalFull                                                        +---------+---------------+---------+-----------+----------+--------------+ PFV      Full                                                        +---------+---------------+---------+-----------+----------+--------------+ POP      Full           Yes      Yes                                 +---------+---------------+---------+-----------+----------+--------------+ PTV      Full                                                        +---------+---------------+---------+-----------+----------+--------------+ PERO     Full                                                        +---------+---------------+---------+-----------+----------+--------------+     Summary: RIGHT: - There is no evidence of deep vein thrombosis in the lower extremity.  - No cystic structure found in the popliteal fossa.  LEFT: - There is no evidence of deep vein thrombosis in the lower extremity.  - No  cystic structure found in the popliteal fossa.  *See table(s) above for measurements and observations. Electronically signed by Ruta Hinds MD on 01/10/2020 at 10:04:29 AM.    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   PLAN: I had a lengthy discussion with the patient and her sister today about her current disease stage, prognosis and treatment options. I recommended for the patient to complete the staging work-up by ordering a PET scan to rule out any other metastatic disease. I explained to the patient that she has incurable condition and all the treatment will be of palliative nature. I recommended for the patient to send the blood test to [facility] for molecular studies.  We will also [facility] tissue block [facility] in case the blood test is negative. I had a lengthy discussion with the patient today about her treatment options including palliative care versus palliative systemic chemotherapy with carboplatin for AUC of 5, Alimta 500 mg/M2 and Keytruda 200 mg IV every 3 weeks if she has no actionable mutations. If the patient has an actionable mutation on the molecular studies, she will be treated with targeted therapy. I recommended for her to proceed with the palliative radiation to the brain under the care of Dr. Lisbeth Renshaw for now. I will see her back for follow-up visit in around 2 weeks for evaluation and more detailed discussion of her treatment options based on the molecular studies and hopefully by that time she would be done with her brain radiation. The patient and her sister agreed to the current plan. She was advised to call immediately if she has any other concerning symptoms in the interval.  The patient voices understanding of current disease status and treatment options and is in agreement with the current care plan.  All questions were answered. The patient knows to  call the clinic with any problems, questions or concerns. We can certainly see the patient much sooner if necessary.  Thank you so much for allowing me to participate in the care of Gennie Eisinger. I will continue to follow up the patient with you and assist in her care.  The total time spent in the appointment was 90 minutes.  Disclaimer: This note was dictated with voice recognition software. Similar sounding words can inadvertently be transcribed and may not be corrected upon review.   Eilleen Kempf January 18, 2020, 1:47 PM

## 2020-01-19 ENCOUNTER — Encounter: Payer: Self-pay | Admitting: *Deleted

## 2020-01-19 ENCOUNTER — Telehealth: Payer: Self-pay | Admitting: Medical Oncology

## 2020-01-19 ENCOUNTER — Ambulatory Visit: Payer: Medicare HMO

## 2020-01-19 ENCOUNTER — Other Ambulatory Visit: Payer: Self-pay | Admitting: Internal Medicine

## 2020-01-19 ENCOUNTER — Telehealth: Payer: Self-pay | Admitting: Radiation Oncology

## 2020-01-19 ENCOUNTER — Telehealth: Payer: Self-pay | Admitting: *Deleted

## 2020-01-19 ENCOUNTER — Ambulatory Visit: Admission: RE | Admit: 2020-01-19 | Payer: Medicare HMO | Source: Ambulatory Visit

## 2020-01-19 MED ORDER — OXYCODONE-ACETAMINOPHEN 5-325 MG PO TABS: 1.0000 | ORAL_TABLET | Freq: Three times a day (TID) | ORAL | 0 refills | Status: DC | PRN

## 2020-01-19 NOTE — Telephone Encounter (Signed)
The patient was on her way to the cancer center when she found out her son who she saw on Saturday, and whom is vaccinated, has tested positive for covid today. She called to let our staff know but her call was not intercepted by our team. I think she spoke with someone in medical oncology who told her to come to the cancer center. She was able to get to our department past screening. Our staff is aware she needs to be tested 5 days after exposure which would be on Thursday of this week before she could proceed with treatment. I've discussed with Dr. Lisbeth Renshaw and [facility] as well. The patient is aware of this as well and we were able to schedule an appointment at [facility] [place] at 12:30 pm this Thursday. Since she's in the department, she will be escorted to the back entrance to leave rather than going back through the department and front entrance. Provided she is negative for covid, she can resume radiation on Thursday but her treatment will need to be moved to later in the day.    Carola Rhine, PAC

## 2020-01-19 NOTE — Telephone Encounter (Signed)
Hinton Dyer and form with the radiation oncology department.  I also recommend for her to get tested in the next few days.

## 2020-01-19 NOTE — Progress Notes (Signed)
I spoke with Stefanie Camacho yesterday at her first visit with Dr. Julien Nordmann.  Unfortunately, patient is dx with stage IV lung cancer.  I gave and explained information on her dx and plan of care.

## 2020-01-19 NOTE — Telephone Encounter (Signed)
Patient called and left me a vm message she needs pain medication.  I updated Dr. Julien Nordmann. He ordered medication. I called patient back with an update. She was thankful for the call.

## 2020-01-19 NOTE — Telephone Encounter (Signed)
COVID exposure- Her son tested positive for COVID  today . She was with him on Saturday 911/21. XRT notified as pt has appt today at 4p and rest of week.

## 2020-01-19 NOTE — Telephone Encounter (Signed)
Pt getting COVID test Thursday.

## 2020-01-20 ENCOUNTER — Encounter: Payer: Self-pay | Admitting: *Deleted

## 2020-01-20 ENCOUNTER — Ambulatory Visit: Payer: Medicare HMO

## 2020-01-20 ENCOUNTER — Telehealth: Payer: Self-pay | Admitting: Internal Medicine

## 2020-01-20 NOTE — Telephone Encounter (Signed)
Scheduled per los. Called and spoke with patient. Confirmed appt 

## 2020-01-20 NOTE — Telephone Encounter (Signed)
Diane, I received a call from Ms. Berline Lopes.  She states her son has Nina and she was with him over the weekend.  She does need to be tested.  I can call her if you would like me to.

## 2020-01-20 NOTE — Progress Notes (Signed)
I followed up on Stefanie Camacho's schedule for Rad Onc.  She is to get tx tomorrow but her COVID test is tomorrow. I contacted rad onc scheduling to contact Stefanie Camacho for further guidance on Stefanie Camacho schedule.

## 2020-01-20 NOTE — Progress Notes (Signed)
I received a message from Shona Simpson PA that she was concerned I told the patient to come to her appt yesterday when her son tested positive for covid.  This was absolutely not the case. The patient called me a few minutes before her appt and left me a vm stating her son had covid.  I called patient back and could not reach her.  I updated rad onc techs and they were unclear what to do.  I contacted Bryson Ha but she did not answer my call or text.  I called the patient back.  She updated me that Dr. Ida Rogue nurse stated it was ok to come in.  I then updated rad onc techs that Dr. Ida Rogue nurse said it was ok to come.   Today, I received more message from [place] questioning why I let the patient come. I told her I did not and explained the above information.  She continued to question me so I called the patient.  Ms. Blazejewski states she does not know who told her to come. I listened as she explained.  She is unclear of who updated her that it was ok to come.  I then updated Bryson Ha.  I updated Dr. Julien Nordmann and he thanked me for updating rad onc and helping with the situation.  I did ask that she go and get a COVID test and update [place] with results.

## 2020-01-21 ENCOUNTER — Other Ambulatory Visit: Payer: Self-pay | Admitting: *Deleted

## 2020-01-21 ENCOUNTER — Telehealth: Payer: Self-pay | Admitting: Radiation Oncology

## 2020-01-21 ENCOUNTER — Ambulatory Visit
Admission: RE | Admit: 2020-01-21 | Discharge: 2020-01-21 | Disposition: A | Discharge status: Home / Self Care | Payer: Medicare HMO | Source: Ambulatory Visit | Attending: Radiation Oncology | Admitting: Radiation Oncology

## 2020-01-21 ENCOUNTER — Ambulatory Visit: Payer: Medicare HMO

## 2020-01-21 ENCOUNTER — Other Ambulatory Visit: Payer: Self-pay

## 2020-01-21 DIAGNOSIS — Z20822 Contact with and (suspected) exposure to covid-19: Secondary | ICD-10-CM | POA: Diagnosis not present

## 2020-01-21 DIAGNOSIS — C7931 Secondary malignant neoplasm of brain: Secondary | ICD-10-CM | POA: Diagnosis not present

## 2020-01-21 DIAGNOSIS — C7951 Secondary malignant neoplasm of bone: Secondary | ICD-10-CM | POA: Diagnosis not present

## 2020-01-21 DIAGNOSIS — C3432 Malignant neoplasm of lower lobe, left bronchus or lung: Secondary | ICD-10-CM | POA: Diagnosis not present

## 2020-01-21 DIAGNOSIS — Z510 Encounter for antineoplastic radiation therapy: Secondary | ICD-10-CM | POA: Diagnosis not present

## 2020-01-21 NOTE — Progress Notes (Signed)
The proposed treatment discussed in cancer conference 01/21/20 is for discussion purpose only and is not a binding recommendation.  The patient was not physically examined nor present for their treatment options.  Therefore, final treatment plans cannot be decided.

## 2020-01-21 NOTE — Telephone Encounter (Signed)
I received a call from the patient and she has tested negative for covid. She can resume treatments without disruption now.     Stefanie Camacho, PAC

## 2020-01-22 ENCOUNTER — Ambulatory Visit: Payer: Medicare HMO

## 2020-01-22 ENCOUNTER — Ambulatory Visit
Admission: RE | Admit: 2020-01-22 | Discharge: 2020-01-22 | Disposition: A | Discharge status: Home / Self Care | Payer: Medicare HMO | Source: Ambulatory Visit | Attending: Radiation Oncology | Admitting: Radiation Oncology

## 2020-01-22 DIAGNOSIS — C3432 Malignant neoplasm of lower lobe, left bronchus or lung: Secondary | ICD-10-CM | POA: Diagnosis not present

## 2020-01-22 DIAGNOSIS — C7931 Secondary malignant neoplasm of brain: Secondary | ICD-10-CM | POA: Diagnosis not present

## 2020-01-22 DIAGNOSIS — Z510 Encounter for antineoplastic radiation therapy: Secondary | ICD-10-CM | POA: Diagnosis not present

## 2020-01-22 DIAGNOSIS — C7951 Secondary malignant neoplasm of bone: Secondary | ICD-10-CM | POA: Diagnosis not present

## 2020-01-25 ENCOUNTER — Telehealth: Payer: Self-pay | Admitting: Medical Oncology

## 2020-01-25 ENCOUNTER — Ambulatory Visit
Admission: RE | Admit: 2020-01-25 | Discharge: 2020-01-25 | Disposition: A | Discharge status: Home / Self Care | Payer: Medicare HMO | Source: Ambulatory Visit | Attending: Radiation Oncology | Admitting: Radiation Oncology

## 2020-01-25 ENCOUNTER — Encounter (HOSPITAL_COMMUNITY)
Admission: RE | Admit: 2020-01-25 | Discharge: 2020-01-25 | Disposition: A | Discharge status: Home / Self Care | Payer: Medicare HMO | Source: Ambulatory Visit | Attending: Internal Medicine | Admitting: Internal Medicine

## 2020-01-25 ENCOUNTER — Other Ambulatory Visit: Payer: Self-pay

## 2020-01-25 ENCOUNTER — Ambulatory Visit: Payer: Medicare HMO

## 2020-01-25 DIAGNOSIS — C7951 Secondary malignant neoplasm of bone: Secondary | ICD-10-CM | POA: Insufficient documentation

## 2020-01-25 DIAGNOSIS — C7931 Secondary malignant neoplasm of brain: Secondary | ICD-10-CM | POA: Diagnosis not present

## 2020-01-25 DIAGNOSIS — C3490 Malignant neoplasm of unspecified part of unspecified bronchus or lung: Secondary | ICD-10-CM | POA: Insufficient documentation

## 2020-01-25 DIAGNOSIS — C3432 Malignant neoplasm of lower lobe, left bronchus or lung: Secondary | ICD-10-CM | POA: Diagnosis not present

## 2020-01-25 DIAGNOSIS — C3491 Malignant neoplasm of unspecified part of right bronchus or lung: Secondary | ICD-10-CM | POA: Diagnosis not present

## 2020-01-25 DIAGNOSIS — E278 Other specified disorders of adrenal gland: Secondary | ICD-10-CM | POA: Diagnosis not present

## 2020-01-25 DIAGNOSIS — Z510 Encounter for antineoplastic radiation therapy: Secondary | ICD-10-CM | POA: Diagnosis not present

## 2020-01-25 LAB — GLUCOSE, CAPILLARY: Glucose-Capillary: 92 mg/dL (ref 70–99)

## 2020-01-25 MED ORDER — FLUDEOXYGLUCOSE F - 18 (FDG) INJECTION
10.1000 | Freq: Once | INTRAVENOUS | Status: AC | PRN
  Administered 2020-01-25: 10.1 via INTRAVENOUS

## 2020-01-25 NOTE — Telephone Encounter (Signed)
PET scan report received . F/u 9/27  "IMPRESSION: 1. Findings of LEFT large pulmonary mass with innumerable pulmonary nodules and widespread bony metastatic disease. 2. LEFT adrenal uptake suspicious for early adrenal metastases. 3. Calvarial and brain metastases better demonstrated on recent MRI. 4. LEFT thyroid bed uptake may represent a small lymph node adjacent to the thyroid or discrete thyroid lesion. Thyroid ultrasound is suggested for further assessment to determine whether second primary is possible with biopsy as warranted. 5. Bilateral pelvic metastases particularly at the LEFT and RIGHT pubic root and RIGHT acetabulum are risk for pathologic fracture.  These results will be called to the ordering clinician or representative by the Radiologist Assistant, and communication documented in the PACS or [facility] " Dashboard.

## 2020-01-26 ENCOUNTER — Encounter: Payer: Self-pay | Admitting: Medical

## 2020-01-26 ENCOUNTER — Telehealth: Payer: Self-pay | Admitting: Pharmacist

## 2020-01-26 ENCOUNTER — Ambulatory Visit
Admission: RE | Admit: 2020-01-26 | Discharge: 2020-01-26 | Disposition: A | Discharge status: Home / Self Care | Payer: Medicare HMO | Source: Ambulatory Visit | Attending: Radiation Oncology | Admitting: Radiation Oncology

## 2020-01-26 ENCOUNTER — Inpatient Hospital Stay (HOSPITAL_BASED_OUTPATIENT_CLINIC_OR_DEPARTMENT_OTHER): Payer: Medicare HMO | Admitting: Medical

## 2020-01-26 ENCOUNTER — Other Ambulatory Visit: Payer: Self-pay

## 2020-01-26 ENCOUNTER — Other Ambulatory Visit: Payer: Self-pay | Admitting: Internal Medicine

## 2020-01-26 ENCOUNTER — Telehealth: Payer: Self-pay

## 2020-01-26 ENCOUNTER — Ambulatory Visit: Payer: Medicare HMO

## 2020-01-26 ENCOUNTER — Telehealth: Payer: Self-pay | Admitting: Medical Oncology

## 2020-01-26 VITALS — BP 110/53 | HR 66 | Temp 98.0°F | Resp 16 | Ht 66.0 in | Wt 191.9 lb

## 2020-01-26 DIAGNOSIS — C7951 Secondary malignant neoplasm of bone: Secondary | ICD-10-CM

## 2020-01-26 DIAGNOSIS — R110 Nausea: Secondary | ICD-10-CM

## 2020-01-26 DIAGNOSIS — C7931 Secondary malignant neoplasm of brain: Secondary | ICD-10-CM | POA: Diagnosis not present

## 2020-01-26 DIAGNOSIS — C3492 Malignant neoplasm of unspecified part of left bronchus or lung: Secondary | ICD-10-CM

## 2020-01-26 DIAGNOSIS — Z510 Encounter for antineoplastic radiation therapy: Secondary | ICD-10-CM | POA: Diagnosis not present

## 2020-01-26 DIAGNOSIS — K224 Dyskinesia of esophagus: Secondary | ICD-10-CM

## 2020-01-26 DIAGNOSIS — C3432 Malignant neoplasm of lower lobe, left bronchus or lung: Secondary | ICD-10-CM | POA: Diagnosis not present

## 2020-01-26 DIAGNOSIS — K5909 Other constipation: Secondary | ICD-10-CM | POA: Diagnosis not present

## 2020-01-26 MED ORDER — PROCHLORPERAZINE MALEATE 10 MG PO TABS: 10.0000 mg | ORAL_TABLET | Freq: Four times a day (QID) | ORAL | 5 refills | Status: DC | PRN

## 2020-01-26 MED ORDER — LIDOCAINE VISCOUS HCL 2 % MT SOLN
15.0000 mL | Freq: Once | OROMUCOSAL | Status: AC
  Administered 2020-01-26: 15 mL via ORAL

## 2020-01-26 MED ORDER — OXYCODONE HCL 5 MG PO TABS
ORAL_TABLET | ORAL | Status: AC
  Filled 2020-01-26: qty 1

## 2020-01-26 MED ORDER — OSIMERTINIB MESYLATE 80 MG PO TABS: 160.0000 mg | ORAL_TABLET | Freq: Every day | ORAL | 3 refills | Status: DC

## 2020-01-26 MED ORDER — ACETAMINOPHEN 325 MG PO TABS
325.0000 mg | ORAL_TABLET | Freq: Once | ORAL | Status: AC
  Administered 2020-01-26: 325 mg via ORAL

## 2020-01-26 MED ORDER — ALUM & MAG HYDROXIDE-SIMETH 200-200-20 MG/5ML PO SUSP
ORAL | Status: AC
  Filled 2020-01-26: qty 30

## 2020-01-26 MED ORDER — PROCHLORPERAZINE EDISYLATE 10 MG/2ML IJ SOLN
10.0000 mg | Freq: Once | INTRAMUSCULAR | Status: AC
  Administered 2020-01-26: 10 mg via INTRAVENOUS

## 2020-01-26 MED ORDER — ALUM & MAG HYDROXIDE-SIMETH 200-200-20 MG/5ML PO SUSP
30.0000 mL | Freq: Once | ORAL | Status: AC
  Administered 2020-01-26: 30 mL via ORAL

## 2020-01-26 MED ORDER — OXYCODONE HCL 5 MG PO TABS
5.0000 mg | ORAL_TABLET | Freq: Once | ORAL | Status: AC
  Administered 2020-01-26: 5 mg via ORAL

## 2020-01-26 MED ORDER — LIDOCAINE VISCOUS HCL 2 % MT SOLN
OROMUCOSAL | Status: AC
  Filled 2020-01-26: qty 15

## 2020-01-26 MED ORDER — ACETAMINOPHEN 325 MG PO TABS
ORAL_TABLET | ORAL | Status: AC
  Filled 2020-01-26: qty 1

## 2020-01-26 MED ORDER — PROCHLORPERAZINE EDISYLATE 10 MG/2ML IJ SOLN
INTRAMUSCULAR | Status: AC
  Filled 2020-01-26: qty 2

## 2020-01-26 MED ORDER — SODIUM CHLORIDE 0.9 % IV SOLN
INTRAVENOUS | Status: AC
  Filled 2020-01-26: qty 250

## 2020-01-26 NOTE — Progress Notes (Signed)
START ON PATHWAY REGIMEN - Non-Small Cell Lung     A cycle is every 28 days:     Osimertinib   **Always confirm dose/schedule in your pharmacy ordering system**  Patient Characteristics: Stage IV Metastatic, Nonsquamous, Initial Molecular Targeted Therapy, EGFR Mutation - Common (Exon 19 Deletion or Exon 21 L858R Substitution) Therapeutic Status: Stage IV Metastatic Histology: Nonsquamous Cell ROS1 Rearrangement Status: Negative Other Mutations/Biomarkers: No Other Actionable Mutations Chemotherapy/Immunotherapy LOT: Not Appropriate Molecular Targeted Therapy: Initial Molecular Targeted Therapy KRAS G12C Mutation Status: Negative MET Exon 14 Mutation Status: Negative RET Gene Fusion Status: Negative EGFR Mutation Status: Positive - Common (Exon 19 Deletion or Exon 21 L858R Substitution) NTRK Gene Fusion Status: Negative PD-L1 Expression Status: Quantity Not Sufficient ALK Rearrangement Status: Negative BRAF V600E Mutation Status: Negative Intent of Therapy: Non-Curative / Palliative Intent, Discussed with Patient 

## 2020-01-26 NOTE — Telephone Encounter (Signed)
Reports , constipation , nausea . Scheduled [facility].

## 2020-01-26 NOTE — Progress Notes (Signed)
[facility] Progress Note   Gladiola Madore [id number] 04-May-1946 74 y.o.  Alahni Varone is managed by Dr. Velora Heckler. Mohamed  Actively treated with chemotherapy/immunotherapy/hormonal therapy: Completing whole brain radiation. Pending start of Keytruda.   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 Chasen continues on whole brain radiation and is pending the start of Keytruda under the direction of Dr. Arbutus Ped.  She is scheduled to be seen in follow-up on 02/01/2020.  An EKG was completed today prior to start of therapy.  Constipation: The patient was given an educational sheet regarding the use of senna S and MiraLAX.  She will be started on a regular bowel regiment today.  Nausea: Mrs. Bonaventura was given 1 L of normal saline over 2 hours and was given Compazine 10 mg IV.  Additionally a prescription for Compazine 10 mg p.o. was sent to her pharmacy.  She was told to increase her Protonix to twice daily dosing for now.  Metastatic adenocarcinoma of the lung with bone metastasis: A PET scan from 01/25/2019 showed:   Bilateral pelvic metastases particularly at the LEFT and RIGHT pubic root and RIGHT acetabulum are risk for pathologic fracture.     A referral was made to Dr. Jacki Cones for determination if the patient may require orthopedic surgery for stabilization of these areas.    Please see After Visit Summary for patient specific instructions.  Future  Appointments  Date Time Provider Department Center  01/27/2020  7:30 AM [facility] LINAC 3 [facility] None  01/28/2020  2:00 PM [facility] LIN[facility] 4 [facility] None  01/29/2020 12:00 PM [facility] LIN[facility] 3 [facility] None  01/29/2020  2:30 PM Vaslow, Georgeanna Lea, MD [facility] None  02/01/2020  2:00 PM [facility] LAB [facility] None  02/01/2020  2:30 PM Heilingoetter, Cassandra L, PA-C [facility] None  02/01/2020  3:00 PM [facility] LINAC 4 [facility] None  02/02/2020  2:20 PM [facility] LINAC 4 [facility] None  02/29/2020 10:20 AM Georgeanna Lea, MD [facility] None    Orders Placed This Encounter  Procedures  . Ambulatory referral to Orthopedic Surgery  . EKG 12-Lead       Subjective:   Patient ID:  Shanequia Kendrick is a 74 y.o. (DOB 12-15-1945) female.  Chief Complaint: No chief complaint on file.   HPI   Dimples Probus is a 74 y.o. female with a diagnosis of a stage IV (T3, N2, M1 C) non-small cell lung cancer.  She originally presented with left lower lobe lung mass with extensive bilateral pulmonary metastasis and anterior mediastinal lymphadenopathy as well as skeletal metastasis and metastatic disease to the brain.  Additionally she was noted to have leptomeningeal disease when diagnosed in September 2021.  She continues on whole brain radiation and presents to the clinic today with nausea and constipation.  She reports that she has been hungry but has had nausea after eating.  She has not had vomiting.  She reports that she has early satiety after only 2-3 bites of food.  She also has noted epigastric pressure.  She has had  no bowel movement since Sunday.  She has had flatulence and belching.  She reports having chills but no fever or sweats.  She has completed 5 of 10 radiation treatments to her brain.  She is also receiving radiation therapy to her right sacrum, right iliac, and left pubis.   Medications: I have reviewed the patient's current medications.  Allergies: No  Known Allergies  Past Medical History:  Diagnosis Date  . Anxiety   . Dyslipidemia   . Essential hypertension   . GERD (gastroesophageal reflux disease)   . Hypothyroidism   . Left bundle branch block 06/04/2018  . NICM (nonischemic cardiomyopathy) ([place])   . Nonischemic cardiomyopathy ([place]) 06/04/2018   Ejection fraction 45% in summer 2019  . Sleep apnea     Past Surgical History:  Procedure Laterality Date  . ABDOMINAL HYSTERECTOMY    . BLADDER NECK RECONSTRUCTION    . BRONCHIAL BIOPSY  01/12/2020   Procedure: BRONCHIAL BIOPSIES;  Surgeon: Garner Nash, DO;  Location: [facility];  Service: Pulmonary;;  . BRONCHIAL BRUSHINGS  01/12/2020   Procedure: BRONCHIAL BRUSHINGS;  Surgeon: Garner Nash, DO;  Location: [facility];  Service: Pulmonary;;  . BRONCHIAL NEEDLE ASPIRATION BIOPSY  01/12/2020   Procedure: BRONCHIAL NEEDLE ASPIRATION BIOPSIES;  Surgeon: Garner Nash, DO;  Location: [facility];  Service: Pulmonary;;  . BRONCHIAL WASHINGS  01/12/2020   Procedure: BRONCHIAL WASHINGS;  Surgeon: Garner Nash, DO;  Location: [facility];  Service: Pulmonary;;  . SHOULDER SURGERY    . TONSILLECTOMY    . VIDEO BRONCHOSCOPY WITH ENDOBRONCHIAL ULTRASOUND  01/12/2020   Procedure: VIDEO BRONCHOSCOPY WITH ENDOBRONCHIAL ULTRASOUND;  Surgeon: Garner Nash, DO;  Location: [facility];  Service: Pulmonary;;    Family History  Problem Relation Age of Onset  . Heart disease Mother   . Stroke Mother   . Heart disease Father   . Stroke Father   . Heart disease Maternal Grandfather   . Heart disease Paternal Grandfather     Social History   Socioeconomic History  . Marital status: Divorced    Spouse name: Not on file  . Number of children: Not on file  . Years of education: Not on file  . Highest education level: Not on file  Occupational History  . Not on file  Tobacco Use  . Smoking status: Never Smoker  . Smokeless tobacco: Never Used  Vaping Use  . Vaping Use:  Never used  Substance and Sexual Activity  . Alcohol use: Not Currently  . Drug use: Never  . Sexual activity: Not on file  Other Topics Concern  . Not on file  Social History Narrative  . Not on file   Social Determinants of Health   Financial Resource Strain:   . Difficulty of Paying Living Expenses: Not on file  Food Insecurity:   . Worried About Charity fundraiser in the Last Year: Not on file  . Ran Out of Food in the Last Year: Not on file  Transportation Needs:   . Lack of Transportation (Medical): Not on file  . Lack of Transportation (Non-Medical): Not on file  Physical Activity:   . Days of Exercise per Week: Not on file  . Minutes of Exercise per Session: Not on file  Stress:   . Feeling of Stress : Not on file  Social Connections:   . Frequency of Communication with Friends and Family: Not on file  . Frequency of Social Gatherings with Friends and Family: Not  on file  . Attends Religious Services: Not on file  . Active Member of Clubs or Organizations: Not on file  . Attends Banker Meetings: Not on file  . Marital Status: Not on file  Intimate Partner Violence:   . Fear of Current or Ex-Partner: Not on file  . Emotionally Abused: Not on file  . Physically Abused: Not on file  . Sexually Abused: Not on file    Past Medical History, Surgical history, Social history, and Family history were reviewed and updated as appropriate.   Please see review of systems for further details on the patient's review from today.   Review of Systems:  Review of Systems  Constitutional: Negative for appetite change, chills, diaphoresis, fever and unexpected weight change.  HENT: Negative for trouble swallowing.   Respiratory: Negative for cough, chest tightness and shortness of breath.   Cardiovascular: Negative for chest pain and palpitations.  Gastrointestinal: Positive for constipation and nausea. Negative for abdominal distention, abdominal pain, anal  bleeding, blood in stool, diarrhea, rectal pain and vomiting.       Early satiety Epigastric pressure  Neurological: Negative for dizziness and headaches.    Objective:   Physical Exam:  BP 115/71 (BP Location: Left Arm, Patient Position: Sitting)   Pulse (!) 58   Temp 98 F (36.7 C) (Tympanic)   Resp 17   Ht 5\' 6"  (1.676 m)   Wt 191 lb 14.4 oz (87 kg)   SpO2 97%   BMI 30.97 kg/m  ECOG: 1  Physical Exam Constitutional:      General: She is not in acute distress.    Appearance: She is not diaphoretic.  HENT:     Head: Normocephalic and atraumatic.  Eyes:     General: No scleral icterus.       Right eye: No discharge.        Left eye: No discharge.     Conjunctiva/sclera: Conjunctivae normal.  Cardiovascular:     Rate and Rhythm: Normal rate and regular rhythm.     Heart sounds: Normal heart sounds. No murmur heard.  No friction rub. No gallop.   Pulmonary:     Effort: Pulmonary effort is normal. No respiratory distress.     Breath sounds: Normal breath sounds. No wheezing or rales.  Abdominal:     General: Bowel sounds are normal. There is no distension.     Palpations: Abdomen is soft. There is no mass.     Tenderness: There is no abdominal tenderness. There is no guarding or rebound.  Musculoskeletal:     Right lower leg: No edema.     Left lower leg: No edema.  Skin:    General: Skin is warm and dry.  Neurological:     Mental Status: She is alert.     Coordination: Coordination normal.     Gait: Gait normal.  Psychiatric:        Mood and Affect: Mood normal.        Behavior: Behavior normal.        Thought Content: Thought content normal.        Judgment: Judgment normal.     Lab Review:     Component Value Date/Time   NA 138 01/18/2020 1339   K 4.2 01/18/2020 1339   CL 105 01/18/2020 1339   CO2 25 01/18/2020 1339   GLUCOSE 130 (H) 01/18/2020 1339   BUN 18 01/18/2020 1339   CREATININE 0.89 01/18/2020 1339   CALCIUM 10.0 01/18/2020  1339   PROT  7.2 01/18/2020 1339   PROT 6.9 07/17/2018 0934   ALBUMIN 3.2 (L) 01/18/2020 1339   ALBUMIN 4.1 07/17/2018 0934   AST 16 01/18/2020 1339   ALT 18 01/18/2020 1339   ALKPHOS 304 (H) 01/18/2020 1339   BILITOT 0.5 01/18/2020 1339   GFRNONAA >60 01/18/2020 1339   GFRAA >60 01/18/2020 1339       Component Value Date/Time   WBC 7.9 01/18/2020 1339   WBC 8.4 01/11/2020 2012   RBC 4.80 01/18/2020 1339   HGB 13.6 01/18/2020 1339   HCT 41.5 01/18/2020 1339   PLT 272 01/18/2020 1339   MCV 86.5 01/18/2020 1339   MCH 28.3 01/18/2020 1339   MCHC 32.8 01/18/2020 1339   RDW 13.1 01/18/2020 1339   LYMPHSABS 1.9 01/18/2020 1339   MONOABS 0.5 01/18/2020 1339   EOSABS 0.3 01/18/2020 1339   BASOSABS 0.0 01/18/2020 1339   -------------------------------  Imaging from last 24 hours (if applicable):  Radiology interpretation: CT ANGIO HEAD W OR WO CONTRAST  Result Date: 01/09/2020 CLINICAL DATA:  Stroke EXAM: CT ANGIOGRAPHY HEAD AND NECK TECHNIQUE: Multidetector CT imaging of the head and neck was performed using the standard protocol during bolus administration of intravenous contrast. Multiplanar CT image reconstructions and MIPs were obtained to evaluate the vascular anatomy. Carotid stenosis measurements (when applicable) are obtained utilizing NASCET criteria, using the distal internal carotid diameter as the denominator. CONTRAST:  63mL OMNIPAQUE IOHEXOL 350 MG/ML SOLN COMPARISON:  None. FINDINGS: CTA NECK FINDINGS SKELETON: There is no bony spinal canal stenosis. No lytic or blastic lesion. OTHER NECK: Normal pharynx, larynx and major salivary glands. No cervical lymphadenopathy. Unremarkable thyroid gland. UPPER CHEST: Innumerable nodules throughout both lungs. AORTIC ARCH: There is no calcific atherosclerosis of the aortic arch. There is no aneurysm, dissection or hemodynamically significant stenosis of the visualized portion of the aorta. Normal variant aortic arch branching pattern with the  brachiocephalic and left common carotid arteries sharing a common origin. The visualized proximal subclavian arteries are widely patent. RIGHT CAROTID SYSTEM: Normal without aneurysm, dissection or stenosis. LEFT CAROTID SYSTEM: Normal without aneurysm, dissection or stenosis. VERTEBRAL ARTERIES: Left dominant configuration. Both origins are clearly patent. There is no dissection, occlusion or flow-limiting stenosis to the skull base (V1-V3 segments). CTA HEAD FINDINGS POSTERIOR CIRCULATION: --Vertebral arteries: Normal V4 segments. --Inferior cerebellar arteries: Normal. --Basilar artery: Normal. --Superior cerebellar arteries: Normal. --Posterior cerebral arteries (PCA): Normal. ANTERIOR CIRCULATION: --Intracranial internal carotid arteries: Normal. --Anterior cerebral arteries (ACA): Normal. Both A1 segments are present. Patent anterior communicating artery (a-comm). --Middle cerebral arteries (MCA): Normal. VENOUS SINUSES: As permitted by contrast timing, patent. ANATOMIC VARIANTS: None Review of the MIP images confirms the above findings. IMPRESSION: 1. No emergent large vessel occlusion or high-grade stenosis of the intracranial arteries. 2. Innumerable nodular pulmonary metastases. Electronically Signed   By: Ulyses Jarred M.D.   On: 01/09/2020 05:17   CT HEAD WO CONTRAST  Result Date: 01/08/2020 CLINICAL DATA:  TIA.  Right facial numbness 2 days ago. EXAM: CT HEAD WITHOUT CONTRAST TECHNIQUE: Contiguous axial images were obtained from the base of the skull through the vertex without intravenous contrast. COMPARISON:  None. FINDINGS: Brain: No evidence of acute infarction, hemorrhage, hydrocephalus, extra-axial collection or mass lesion/mass effect. Vascular: Negative for hyperdense vessel Skull: Negative Sinuses/Orbits: Paranasal sinuses clear. Bilateral cataract extraction. No orbital lesion. Other: None IMPRESSION: Negative CT head Electronically Signed   By: Franchot Gallo M.D.   On: 01/08/2020 13:50    CT  ANGIO NECK W OR WO CONTRAST  Result Date: 01/09/2020 CLINICAL DATA:  Stroke EXAM: CT ANGIOGRAPHY HEAD AND NECK TECHNIQUE: Multidetector CT imaging of the head and neck was performed using the standard protocol during bolus administration of intravenous contrast. Multiplanar CT image reconstructions and MIPs were obtained to evaluate the vascular anatomy. Carotid stenosis measurements (when applicable) are obtained utilizing NASCET criteria, using the distal internal carotid diameter as the denominator. CONTRAST:  15mL OMNIPAQUE IOHEXOL 350 MG/ML SOLN COMPARISON:  None. FINDINGS: CTA NECK FINDINGS SKELETON: There is no bony spinal canal stenosis. No lytic or blastic lesion. OTHER NECK: Normal pharynx, larynx and major salivary glands. No cervical lymphadenopathy. Unremarkable thyroid gland. UPPER CHEST: Innumerable nodules throughout both lungs. AORTIC ARCH: There is no calcific atherosclerosis of the aortic arch. There is no aneurysm, dissection or hemodynamically significant stenosis of the visualized portion of the aorta. Normal variant aortic arch branching pattern with the brachiocephalic and left common carotid arteries sharing a common origin. The visualized proximal subclavian arteries are widely patent. RIGHT CAROTID SYSTEM: Normal without aneurysm, dissection or stenosis. LEFT CAROTID SYSTEM: Normal without aneurysm, dissection or stenosis. VERTEBRAL ARTERIES: Left dominant configuration. Both origins are clearly patent. There is no dissection, occlusion or flow-limiting stenosis to the skull base (V1-V3 segments). CTA HEAD FINDINGS POSTERIOR CIRCULATION: --Vertebral arteries: Normal V4 segments. --Inferior cerebellar arteries: Normal. --Basilar artery: Normal. --Superior cerebellar arteries: Normal. --Posterior cerebral arteries (PCA): Normal. ANTERIOR CIRCULATION: --Intracranial internal carotid arteries: Normal. --Anterior cerebral arteries (ACA): Normal. Both A1 segments are present. Patent  anterior communicating artery (a-comm). --Middle cerebral arteries (MCA): Normal. VENOUS SINUSES: As permitted by contrast timing, patent. ANATOMIC VARIANTS: None Review of the MIP images confirms the above findings. IMPRESSION: 1. No emergent large vessel occlusion or high-grade stenosis of the intracranial arteries. 2. Innumerable nodular pulmonary metastases. Electronically Signed   By: Ulyses Jarred M.D.   On: 01/09/2020 05:17   MR ANGIO HEAD WO CONTRAST  Result Date: 01/09/2020 CLINICAL DATA:  Acute neurologic deficit. Slurred speech and facial numbness EXAM: MRI HEAD WITHOUT CONTRAST MRA HEAD WITHOUT CONTRAST TECHNIQUE: Multiplanar, multiecho pulse sequences of the brain and surrounding structures were obtained without intravenous contrast. Angiographic images of the head were obtained using MRA technique without contrast. COMPARISON:  None. FINDINGS: MRI HEAD FINDINGS Brain: There is a small amount of hemorrhage, likely subacute, within the left occipital lobe. It is unclear whether this is subarachnoid or intraparenchymal within the cortex. There are areas of hyperintense T2-weighted signal within the left temporal lobe. Normal volume of CSF spaces. No chronic microhemorrhage. Normal midline structures. Vascular: Normal flow voids. Skull and upper cervical spine: Normal marrow signal. Sinuses/Orbits: Negative. Other: None. MRA HEAD FINDINGS POSTERIOR CIRCULATION: --Vertebral arteries: Normal V4 segments. --Inferior cerebellar arteries: Normal. --Basilar artery: Normal. --Superior cerebellar arteries: Normal. --Posterior cerebral arteries: Multifocal stenosis of the distal left PCA. Normal right PCA. ANTERIOR CIRCULATION: --Intracranial internal carotid arteries: Normal. --Anterior cerebral arteries (ACA): Normal. Both A1 segments are present. Patent anterior communicating artery (a-comm). --Middle cerebral arteries (MCA): Normal. IMPRESSION: 1. Small amount of hemorrhage, likely subacute, within the left  occipital lobe. Given the multifocal left PCA stenosis, this probably represents hemorrhage associated with a recent infarct. 2. Areas of hyperintense T2-weighted signal within the left temporal lobe may also be due to prior left PCA ischemic events. 3. Multifocal stenosis of the distal left PCA. Otherwise normal intracranial MRA. Electronically Signed   By: Ulyses Jarred M.D.   On: 01/09/2020 02:00   MR BRAIN WO CONTRAST  Result Date: 01/09/2020 CLINICAL DATA:  Acute neurologic deficit. Slurred speech and facial numbness EXAM: MRI HEAD WITHOUT CONTRAST MRA HEAD WITHOUT CONTRAST TECHNIQUE: Multiplanar, multiecho pulse sequences of the brain and surrounding structures were obtained without intravenous contrast. Angiographic images of the head were obtained using MRA technique without contrast. COMPARISON:  None. FINDINGS: MRI HEAD FINDINGS Brain: There is a small amount of hemorrhage, likely subacute, within the left occipital lobe. It is unclear whether this is subarachnoid or intraparenchymal within the cortex. There are areas of hyperintense T2-weighted signal within the left temporal lobe. Normal volume of CSF spaces. No chronic microhemorrhage. Normal midline structures. Vascular: Normal flow voids. Skull and upper cervical spine: Normal marrow signal. Sinuses/Orbits: Negative. Other: None. MRA HEAD FINDINGS POSTERIOR CIRCULATION: --Vertebral arteries: Normal V4 segments. --Inferior cerebellar arteries: Normal. --Basilar artery: Normal. --Superior cerebellar arteries: Normal. --Posterior cerebral arteries: Multifocal stenosis of the distal left PCA. Normal right PCA. ANTERIOR CIRCULATION: --Intracranial internal carotid arteries: Normal. --Anterior cerebral arteries (ACA): Normal. Both A1 segments are present. Patent anterior communicating artery (a-comm). --Middle cerebral arteries (MCA): Normal. IMPRESSION: 1. Small amount of hemorrhage, likely subacute, within the left occipital lobe. Given the multifocal  left PCA stenosis, this probably represents hemorrhage associated with a recent infarct. 2. Areas of hyperintense T2-weighted signal within the left temporal lobe may also be due to prior left PCA ischemic events. 3. Multifocal stenosis of the distal left PCA. Otherwise normal intracranial MRA. Electronically Signed   By: Ulyses Jarred M.D.   On: 01/09/2020 02:00   MR BRAIN W CONTRAST  Result Date: 01/10/2020 CLINICAL DATA:  Non-small cell lung cancer staging. EXAM: MRI HEAD WITH CONTRAST TECHNIQUE: Multiplanar, multiecho pulse sequences of the brain and surrounding structures were obtained with intravenous contrast. CONTRAST:  46mL GADAVIST GADOBUTROL 1 MMOL/ML IV SOLN COMPARISON:  Noncontrast head MRI 01/09/2020 FINDINGS: There are numerous enhancing lesions in both cerebral hemispheres with the largest measuring 1.4 cm in the left temporal lobe (series 5, image 19). There is minimal edema associated with this lesion as well as with a slightly smaller lesion more posteriorly at the left temporo-occipital junction. There are also multiple areas of abnormal leptomeningeal enhancement bilaterally, greatest in the left occipital region. No enhancing lesions are identified in the posterior fossa. An enhancing right frontoparietal skull lesion measures 1.6 cm (series 5, image 45). IMPRESSION: 1. 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 2. 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 Electronically Signed   By: Logan Bores M.D.   On: 01/10/2020 16:35   CT CHEST ABDOMEN PELVIS W CONTRAST  Result Date: 01/10/2020 CLINICAL DATA:  Further evaluation of lung nodules noted at the lung apices on the CTA neck from yesterday. Unintentional weight loss. EXAM: CT CHEST, ABDOMEN, AND PELVIS WITH CONTRAST TECHNIQUE: Multidetector CT imaging of the chest, abdomen and pelvis was performed following the standard protocol during bolus administration of intravenous  contrast. CONTRAST:  143mL OMNIPAQUE IOHEXOL 300 MG/ML  SOLN COMPARISON:  CTA neck, 01/09/2020. FINDINGS: CT CHEST FINDINGS Cardiovascular: Heart is normal in size and configuration. No pericardial effusion. No coronary artery calcifications. Great vessels are normal in caliber. Minimal aortic atherosclerosis along the descending portion. Branch vessels are widely patent. Mediastinum/Nodes: Masslike soft tissue along the anterior mediastinum anterior to the heart, 2.0 x 1.1 cm in size, possibly a mildly enlarged lymph node. No other mediastinal masses. No other evidence of an enlarged lymph node. No neck base or axillary masses or adenopathy. Trachea and esophagus are unremarkable.  Lungs/Pleura: Left lower lobe mass with ill-defined margins, measuring approximately 7.8 cm from superior to inferior by 4.8 x 4.2 cm transversely. There are numerous bilateral pulmonary nodules consistent with widespread metastatic disease throughout both lungs. No evidence of pneumonia or pulmonary edema. No pleural effusion or pneumothorax. Musculoskeletal: Lucent bone lesions. The best defined arises from the transverse process of T5 on the right. Chest wall: Small nodule/mass in the right breast measuring 7 mm. CT ABDOMEN PELVIS FINDINGS Hepatobiliary: 3 cm mass in the peripheral aspect of the right liver lobe demonstrating peripheral nodular enhancement consistent with a hemangioma. No other liver masses or lesions. Normal gallbladder. No bile duct dilation. Pancreas: Unremarkable. No pancreatic ductal dilatation or surrounding inflammatory changes. Spleen: Normal in size without focal abnormality. Adrenals/Urinary Tract: No adrenal masses. Kidneys are normal in size, orientation and position with symmetric enhancement and excretion. 4-5 mm low-attenuation oval mass in the midpole the right kidney, too small to fully characterize, but consistent with a cyst. 1 cm lower pole cyst from the right kidney. No other renal masses, no  stones and no hydronephrosis. Normal ureters. Normal bladder. Stomach/Bowel: Normal stomach. Small bowel and colon are normal in caliber. No masses. No wall thickening or inflammation. Normal appendix visualized. Vascular/Lymphatic: No significant vascular findings are present. No enlarged abdominal or pelvic lymph nodes. Reproductive: Status post hysterectomy. No adnexal masses. Other: No abdominal wall hernia.  No ascites. Musculoskeletal: There are lucent/lytic bone lesions. Lesions are noted in the anterior left acetabulum, at the base of the pubis, right ilium, upper sacrum and subtly along the lumbar spine. There is also an area of mixed sclerosis along the anterior right acetabulum. IMPRESSION: 1. Dominant mass in the left lower lobe consistent with a primary bronchogenic carcinoma. This measures approximately 7.8 x 4.2 x 4.8 cm. 2. Extensive metastatic disease to both lungs with numerous lung nodules throughout all lobes. 2.0 x 1.1 cm soft tissue mass versus a mildly enlarged lymph node in the anterior mediastinum anterior to the heart, possibly metastatic disease. 3. Skeletal metastatic disease with lucent bone lesions, the most definitively seen within the right T5 transverse process, the sacrum, right ilium and left anterior acetabulum/pubis. 4. No other evidence of metastatic disease. 5. No acute findings. 6. 3 cm right liver lobe hemangioma. Electronically Signed   By: Lajean Manes M.D.   On: 01/10/2020 10:10   NM PET Image Initial (PI) Skull Base To Thigh  Result Date: 01/25/2020 CLINICAL DATA:  Initial treatment strategy for non-small cell lung cancer. EXAM: NUCLEAR MEDICINE PET SKULL BASE TO THIGH TECHNIQUE: 10.1 mCi F-18 FDG was injected intravenously. Full-ring PET imaging was performed from the skull base to thigh after the radiotracer. CT data was obtained and used for attenuation correction and anatomic localization. Fasting blood glucose: 92 mg/dl COMPARISON:  Chest CT from January 10, 2020 FINDINGS: Mediastinal blood pool activity: SUV max 3.49 Liver activity: SUV max 4.44 NECK: Calvarial and brain metastases better demonstrated on recent MRI, not well seen on PET evaluation. No hypermetabolic lymph nodes in the neck. Incidental CT findings: None CHEST: Innumerable pulmonary nodules all with increased FDG uptake to varying degrees. LEFT lower lobe pulmonary mass seen on previous imaging grossly stable in the interval, as measured on the previous exam and on the current exam at approximately 4.8 x 4.2 cm. Partially obscured by atelectasis on the current study with intense metabolic activity (SUVmax = 13.3) RIGHT upper lobe pulmonary nodule 11 mm (image 78, series 4) (SUVmax = 8.2) Numerous  additional pulmonary nodules, too numerous to count in the bilateral chest. Not associated with pleural effusion aside from a small amount of fluid adjacent to the dominant mass in the LEFT lower lobe all showing increased FDG uptake as discussed. No adenopathy by size criteria. Mildly enlarged prevascular lymph node on image 66 of series 4 shows a maximum SUV of 3.85. Enlarged pre pericardial lymph node with some calcification measures 10 mm short axis with a maximum SUV of 3.9 Intensely FDG avid area in LEFT thyroid bed corresponds to an area of low attenuation contiguous with the LEFT thyroid lobe (image 47, series 4) maximum SUV of 5.8 Incidental CT findings: Calcified atheromatous plaque in the thoracic aorta. No aneurysmal dilation. No pericardial effusion. Esophagus grossly normal. ABDOMEN/PELVIS: Subtle nodule associated with the inferior LEFT adrenal gland (SUVmax = 6.1 (image 114 of series 4) 7 mm. RIGHT adrenal gland without discrete mass, uptake difficult to separate from adjacent liver tiny lymph node posterior to the aorta on image 133 of series 4 with an SUV of 4.6) Incidental CT findings: Low-density area in the liver corresponding to hemangioma seen on prior imaging. No acute gastrointestinal  process. Calcified atheromatous plaque of the abdominal aorta, mild and without aneurysmal dilation. No ascites. Post hysterectomy. SKELETON: Multifocal areas of bony metastatic disease with marked increased FDG uptake, some areas seen on previous imaging and others occult on CT. T5 RIGHT transverse process on image 67 of series 4 is an example of a lucent lesion that is visible on CT with increased metabolic activity (SUVmax = 12.2) Another area in the spine that is occult on CT shows increased FDG uptake at the T6 vertebral level (image 81 of series 4, only subtle changes can be appreciated in hindsight in this area) (SUVmax = 8.7 Faint uptake in the C3 vertebral body raising the question of metastatic disease in this location with similar uptake also in the T3 vertebral body, activity at C3 is 5.3) Areas of uptake within the RIGHT humerus in the humeral shaft without corresponding CT abnormality (image 34 of series 4 RIGHT-sided rib uptake affecting RIGHT sixth rib most notably on image 89 of series 4 with a maximum SUV of 9.7 subtle changes elsewhere in the ribs bilaterally compatible with multifocal involvement. L1 lucent lesion involving posterior vertebral body measuring 11 mm (image 111 of series 4) (SUVmax = 11.3. This violates posterior cortex. Similar small area of lucency and L2 also showing increased metabolic activity along with the LEFT transverse process at L2. Posterior elements at L2 with spinous process involvement, also spinous process involvement at L5. Lytic lesion in the sacrum on the RIGHT at the S1 level (image 149 of series 4 2.3 cm (SUVmax = 12.3) Multifocal areas of sacral involvement, RIGHT-sided sacral lesion involves the sacral ala. There is scattered areas in the LEFT sacral ala that are occult on CT or nearly occult, near the S2 neural foramen for instance is an approximately 1 cm lesion with a maximum SUV of 13.9 (image 158 of series 4) Intense FDG uptake involving the RIGHT iliac  above the acetabulum in the area of abnormality discussed on the previous CT evaluation. Mixed lytic and sclerotic change of the RIGHT pubic root is similar to the prior study and risk for pathologic fracture based on location with intense metabolic activity extending to the medial wall of the acetabulum on image 175 of series 3 (SUVmax = 10.9) similar activity in the LEFT acetabulum with a frankly destructive lesion at the LEFT  pubic root on image 176 of series 4 measuring approximately 1.9 cm. Also with metastatic focus in the RIGHT femoral metaphysis that is occult on CT. Similarly LEFT femoral metaphyseal and trochanteric lesions are occult on CT. LEFT iliac with 3 discrete areas of increased metabolic uptake that show faint CT correlate. Incidental CT findings: RIGHT sixth rib region select region lesion is associated with pathologic fracture which is subacute. Spinal degenerative changes. IMPRESSION: 1. Findings of LEFT large pulmonary mass with innumerable pulmonary nodules and widespread bony metastatic disease. 2. LEFT adrenal uptake suspicious for early adrenal metastases. 3. Calvarial and brain metastases better demonstrated on recent MRI. 4. LEFT thyroid bed uptake may represent a small lymph node adjacent to the thyroid or discrete thyroid lesion. Thyroid ultrasound is suggested for further assessment to determine whether second primary is possible with biopsy as warranted. 5. Bilateral pelvic metastases particularly at the LEFT and RIGHT pubic root and RIGHT acetabulum are risk for pathologic fracture. These results will be called to the ordering clinician or representative by the Radiologist Assistant, and communication documented in the PACS or [employer]. Electronically Signed   By: Zetta Bills M.D.   On: 01/25/2020 09:41   DG CHEST PORT 1 VIEW  Result Date: 01/12/2020 CLINICAL DATA:  Status post bronchoscopy and biopsy today. EXAM: PORTABLE CHEST 1 VIEW COMPARISON:  CT chest, abdomen  and pelvis 01/10/2020. FINDINGS: There is no pneumothorax after bronchoscopy. Innumerable bilateral pulmonary nodules and a left lower lobe mass are again seen. Heart size is normal. No pleural fluid. IMPRESSION: Negative for pneumothorax after bronchoscopy. Left lower lobe mass and innumerable pulmonary nodules as seen on prior CT. Electronically Signed   By: Inge Rise M.D.   On: 01/12/2020 15:03   EEG adult  Result Date: 01/12/2020 Lora Havens, MD     01/12/2020  1:46 PM Patient Name: Berenice Oehlert MRN: [id number] Epilepsy Attending: Lora Havens Referring Physician/Provider: Dr. Eleonore Chiquito Date: 01/11/2020 Duration: 23.03 minutes Patient history: 74 year old female with transient right facial numbness.  EEG to evaluate for seizures. Level of alertness: Awake AEDs during EEG study: [place] Technical aspects: This EEG study was done with scalp electrodes positioned according to the 10-20 International system of electrode placement. Electrical activity was acquired at a sampling rate of $Remov'500Hz'oVOMCp$  and reviewed with a high frequency filter of $RemoveB'70Hz'SRmSYeqy$  and a low frequency filter of $RemoveB'1Hz'zvXVAEbS$ . EEG data were recorded continuously and digitally stored. Description: The posterior dominant rhythm consists of 10 Hz activity of moderate voltage (25-35 uV) seen predominantly in posterior head regions, symmetric and reactive to eye opening and eye closing. Hyperventilation and photic stimulation were not performed.   IMPRESSION: This study is within normal limits. No seizures or epileptiform discharges were seen throughout the recording. Lora Havens   ECHOCARDIOGRAM COMPLETE  Result Date: 01/09/2020    ECHOCARDIOGRAM REPORT   Patient Name:   KRYSTL WICKWARE Date of Exam: 01/09/2020 Medical Rec #:  [id number]     Height:       66.0 in Accession #:    [id number]    Weight:       193.6 lb Date of Birth:  07-13-1945     BSA:          1.972 m Patient Age:    89 years      BP:           137/66 mmHg Patient Gender: F              HR:  59 bpm. Exam Location:  Inpatient Procedure: 2D Echo, Cardiac Doppler and Color Doppler Indications:    Stroke  History:        Patient has prior history of Echocardiogram examinations, most                 recent 07/07/2019. Arrythmias:LBBB; Risk Factors:Hypertension,                 Dyslipidemia and Sleep Apnea. Non-ischemic cardiomyopathy.  Sonographer:    Clayton Lefort RDCS (AE) Referring Phys: [place]  1. Left ventricular ejection fraction, by estimation, is 55 to 60%. The left ventricle has normal function. The left ventricle has no regional wall motion abnormalities. There is mild left ventricular hypertrophy. Left ventricular diastolic parameters are consistent with Grade I diastolic dysfunction (impaired relaxation).  2. Right ventricular systolic function is normal. The right ventricular size is normal.  3. The mitral valve is grossly normal. Trivial mitral valve regurgitation.  4. The aortic valve is grossly normal. Aortic valve regurgitation is not visualized. No aortic stenosis is present. FINDINGS  Left Ventricle: Left ventricular ejection fraction, by estimation, is 55 to 60%. The left ventricle has normal function. The left ventricle has no regional wall motion abnormalities. The left ventricular internal cavity size was normal in size. There is  mild left ventricular hypertrophy. Left ventricular diastolic parameters are consistent with Grade I diastolic dysfunction (impaired relaxation). Right Ventricle: The right ventricular size is normal. No increase in right ventricular wall thickness. Right ventricular systolic function is normal. Left Atrium: Left atrial size was normal in size. Right Atrium: Right atrial size was normal in size. Pericardium: There is no evidence of pericardial effusion. Mitral Valve: The mitral valve is grossly normal. Trivial mitral valve regurgitation. MV peak gradient, 2.9 mmHg. The mean mitral valve gradient is 1.0 mmHg. Tricuspid  Valve: The tricuspid valve is grossly normal. Tricuspid valve regurgitation is not demonstrated. Aortic Valve: The aortic valve is grossly normal. Aortic valve regurgitation is not visualized. No aortic stenosis is present. Aortic valve mean gradient measures 3.0 mmHg. Aortic valve peak gradient measures 6.2 mmHg. Aortic valve area, by VTI measures 2.10 cm. Pulmonic Valve: The pulmonic valve was grossly normal. Pulmonic valve regurgitation is trivial. Aorta: The aortic root and ascending aorta are structurally normal, with no evidence of dilitation. IAS/Shunts: The atrial septum is grossly normal.  LEFT VENTRICLE PLAX 2D LVIDd:         4.10 cm  Diastology LVIDs:         3.00 cm  LV e' lateral:   8.05 cm/s LV PW:         1.20 cm  LV E/e' lateral: 6.2 LV IVS:        1.20 cm  LV e' medial:    6.31 cm/s LVOT diam:     2.00 cm  LV E/e' medial:  7.9 LV SV:         50 LV SV Index:   25 LVOT Area:     3.14 cm  RIGHT VENTRICLE             IVC RV Basal diam:  3.00 cm     IVC diam: 0.90 cm RV S prime:     14.10 cm/s TAPSE (M-mode): 2.7 cm LEFT ATRIUM             Index       RIGHT ATRIUM           Index LA diam:  2.60 cm 1.32 cm/m  RA Area:     11.40 cm LA Vol (A2C):   35.9 ml 18.20 ml/m RA Volume:   21.90 ml  11.10 ml/m LA Vol (A4C):   32.8 ml 16.63 ml/m LA Biplane Vol: 36.6 ml 18.56 ml/m  AORTIC VALVE AV Area (Vmax):    1.98 cm AV Area (Vmean):   2.04 cm AV Area (VTI):     2.10 cm AV Vmax:           125.00 cm/s AV Vmean:          86.100 cm/s AV VTI:            0.238 m AV Peak Grad:      6.2 mmHg AV Mean Grad:      3.0 mmHg LVOT Vmax:         78.90 cm/s LVOT Vmean:        56.000 cm/s LVOT VTI:          0.159 m LVOT/AV VTI ratio: 0.67  AORTA Ao Root diam: 3.20 cm Ao Asc diam:  3.30 cm MITRAL VALVE MV Area (PHT): 2.45 cm    SHUNTS MV Peak grad:  2.9 mmHg    Systemic VTI:  0.16 m MV Mean grad:  1.0 mmHg    Systemic Diam: 2.00 cm MV Vmax:       0.85 m/s MV Vmean:      48.3 cm/s MV Decel Time: 310 msec MV E  velocity: 50.10 cm/s MV A velocity: 74.10 cm/s MV E/A ratio:  0.68 Mertie Moores MD Electronically signed by Mertie Moores MD Signature Date/Time: 01/09/2020/1:34:44 PM    Final    VAS [place] LOWER EXTREMITY VENOUS (DVT)  Result Date: 01/10/2020  Lower Venous DVTStudy Indications: Embolic stroke.  Comparison Study: No prior studies. Performing Technologist: Darlin Coco  Examination Guidelines: A complete evaluation includes B-mode imaging, spectral Doppler, color Doppler, and power Doppler as needed of all accessible portions of each vessel. Bilateral testing is considered an integral part of a complete examination. Limited examinations for reoccurring indications may be performed as noted. The reflux portion of the exam is performed with the patient in reverse Trendelenburg.  +---------+---------------+---------+-----------+----------+--------------+ RIGHT    CompressibilityPhasicitySpontaneityPropertiesThrombus Aging +---------+---------------+---------+-----------+----------+--------------+ CFV      Full           Yes      Yes                                 +---------+---------------+---------+-----------+----------+--------------+ SFJ      Full                                                        +---------+---------------+---------+-----------+----------+--------------+ FV Prox  Full                                                        +---------+---------------+---------+-----------+----------+--------------+ FV Mid   Full                                                        +---------+---------------+---------+-----------+----------+--------------+  FV DistalFull                                                        +---------+---------------+---------+-----------+----------+--------------+ PFV      Full                                                        +---------+---------------+---------+-----------+----------+--------------+ POP      Full            Yes      Yes                                 +---------+---------------+---------+-----------+----------+--------------+ PTV      Full                                                        +---------+---------------+---------+-----------+----------+--------------+ PERO     Full                                                        +---------+---------------+---------+-----------+----------+--------------+   +---------+---------------+---------+-----------+----------+--------------+ LEFT     CompressibilityPhasicitySpontaneityPropertiesThrombus Aging +---------+---------------+---------+-----------+----------+--------------+ CFV      Full           Yes      Yes                                 +---------+---------------+---------+-----------+----------+--------------+ SFJ      Full                                                        +---------+---------------+---------+-----------+----------+--------------+ FV Prox  Full                                                        +---------+---------------+---------+-----------+----------+--------------+ FV Mid   Full                                                        +---------+---------------+---------+-----------+----------+--------------+ FV DistalFull                                                        +---------+---------------+---------+-----------+----------+--------------+  PFV      Full                                                        +---------+---------------+---------+-----------+----------+--------------+ POP      Full           Yes      Yes                                 +---------+---------------+---------+-----------+----------+--------------+ PTV      Full                                                        +---------+---------------+---------+-----------+----------+--------------+ PERO     Full                                                         +---------+---------------+---------+-----------+----------+--------------+     Summary: RIGHT: - There is no evidence of deep vein thrombosis in the lower extremity.  - No cystic structure found in the popliteal fossa.  LEFT: - There is no evidence of deep vein thrombosis in the lower extremity.  - No cystic structure found in the popliteal fossa.  *See table(s) above for measurements and observations. Electronically signed by Fabienne Bruns MD on 01/10/2020 at 10:04:29 AM.    Final         This patient was seen with Dr. Arbutus Ped with my treatment plan reviewed with him. He expressed agreement with my medical management of this patient.  ADDENDUM: Hematology/Oncology Attending: I had a face-to-face encounter with the patient today.  I recommended her care plan.  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 She is currently undergoing palliative radiotherapy to the metastatic brain lesions as well as the pelvic metastatic lesions in the right acetabulum. She presented today for evaluation with persistent nausea and vomiting. We will arrange for the patient to receive IV normal saline with Zofran today. I had a lengthy discussion with the patient about the new molecular study findings and treatment options. I recommended for the patient treatment with Tagrisso 160 mg p.o. daily because of the leptomeningeal disease according to the [place] study.  I discussed with the patient the adverse effect of this treatment including but not limited to skin rash, diarrhea, interstitial lung disease, QT  prolongation as well as other cardiac adverse effects. We will arrange for the patient to have EKG as a baseline today. We will send the prescription to her pharmacy for refill and she will have education as well as evaluation by the pharmacist for oral  oncolytic. For the metastatic disease at the right acetabulum with risk for pathologic fracture, we will refer the patient to orthopedic surgery for evaluation and recommendation regarding this abnormality. The patient is scheduled to come back for follow-up visit next week for evaluation and management of any adverse effect of her treatment. She was advised to call immediately if she has any concerning symptoms in the interval.  Disclaimer: This note was dictated with voice recognition software. Similar sounding words can inadvertently be transcribed and may be missed upon review. Eilleen Kempf, MD 01/26/20

## 2020-01-26 NOTE — Telephone Encounter (Signed)
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  Prescription dose and frequency assessed for appropriateness. Appropriate for therapy initiation.   CBC w/ Diff and CMP from 01/18/20 assessed, CBC w/ diff unremarkable, noted alk phos elevated at 304 U/L likely 2/2 skeletal metastasis.  Current medication list in Epic reviewed, DDIs with Tagrisso identified:  Category B DDI between [place] and Fluoxetine - caution use in agents that can cause QTc-prolongation. Patient's last EKG on 01/12/20 had stable QTc of 430. No changes in therapy necessary at this time.   Evaluated chart and no patient barriers to medication adherence noted.   Prescription has been e-scribed to the [facility] for benefits analysis and approval.  Oral Oncology Clinic will continue to follow for insurance authorization, copayment issues, initial counseling and start date.  Leron Croak, PharmD, BCPS Hematology/Oncology Clinical Pharmacist [facility] [phone] 01/26/2020 3:42 PM

## 2020-01-26 NOTE — Telephone Encounter (Signed)
Oral Oncology Patient Advocate Encounter  Received notification from [place] that prior authorization for Tagrisso is required.  PA submitted on CoverMyMeds Key [id number] Status is pending  Oral Oncology Clinic will continue to follow.  [place] Patient [place] Phone [phone] Fax [phone] 01/26/2020 3:42 PM

## 2020-01-26 NOTE — Patient Instructions (Signed)

## 2020-01-27 ENCOUNTER — Ambulatory Visit: Payer: Medicare HMO

## 2020-01-27 ENCOUNTER — Other Ambulatory Visit: Payer: Self-pay

## 2020-01-27 ENCOUNTER — Encounter: Payer: Self-pay | Admitting: Orthopaedic Surgery

## 2020-01-27 ENCOUNTER — Ambulatory Visit: Payer: Medicare HMO | Admitting: Orthopaedic Surgery

## 2020-01-27 ENCOUNTER — Ambulatory Visit
Admission: RE | Admit: 2020-01-27 | Discharge: 2020-01-27 | Disposition: A | Discharge status: Home / Self Care | Payer: Medicare HMO | Source: Ambulatory Visit | Attending: Radiation Oncology | Admitting: Radiation Oncology

## 2020-01-27 DIAGNOSIS — Z510 Encounter for antineoplastic radiation therapy: Secondary | ICD-10-CM | POA: Diagnosis not present

## 2020-01-27 DIAGNOSIS — C3432 Malignant neoplasm of lower lobe, left bronchus or lung: Secondary | ICD-10-CM | POA: Diagnosis not present

## 2020-01-27 DIAGNOSIS — C7931 Secondary malignant neoplasm of brain: Secondary | ICD-10-CM | POA: Diagnosis not present

## 2020-01-27 DIAGNOSIS — C7951 Secondary malignant neoplasm of bone: Secondary | ICD-10-CM | POA: Diagnosis not present

## 2020-01-27 NOTE — Progress Notes (Signed)
Office Visit Note   Patient: Stefanie Camacho           Date of Birth: 08-22-1945           MRN: [id number] Visit Date: 01/27/2020              Requested by: Harle Stanford., PA-C [place],  [place] [address] PCP: [name], MD   Assessment & Plan: Visit Diagnoses:  1. Bone metastasis ([place])     Plan: We reviewed the imaging studies including PET scan and CT scans of the abdomen and pelvis.  She has right T5 transverse process disease as well as sacral margins most significant on the right side also right ilium and lesions adjacent to the medial acetabulum and dome on the right side.  At this point plain radiographs do not show significant bone destruction we reviewed CT scans with her.  I recommend continue using the walker and will check her in a month and obtain AP pelvis x-ray at that time.  I do not think she needs immediate stabilization of either hip at this point which we discussed would be hip arthroplasty with curetting of the lesion and adding some graft material prior to placement of the acetabular cup.  Follow-Up Instructions: Return in about 1 month (around 02/26/2020).   Orders:  No orders of the defined types were placed in this encounter.  No orders of the defined types were placed in this encounter.     Procedures: No procedures performed   Clinical Data: No additional findings.   Subjective: Chief Complaint  Patient presents with  . Right Hip - Pain  . Left Hip - Pain    HPI 74 year old female with recent diagnosis of stage IV lung cancer.  This is non-small cell and she is seen by the oncologist for treatment. 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  She had bilateral pelvic metastasis particularly in the left and right pubic root and right acetabulum just above the dome.  She is primarily having right SI joint pain where she has large sacral lytic lesion.  Patient had  no lesions noted on PET scan in the femur.  Femoral neck was normal.  Patient is currently getting radiation treatments.  She states imaging of therapy is also been discussed. Review of Systems all other systems are reviewed and are noncontributory.   Objective: Vital Signs: Ht 5\' 6"  (1.676 m)   Wt 191 lb (86.6 kg)   BMI 30.83 kg/m   Physical Exam Constitutional:      Appearance: She is well-developed.  HENT:     Head: Normocephalic.     Right Ear: External ear normal.     Left Ear: External ear normal.  Eyes:     Pupils: Pupils are equal, round, and reactive to light.  Neck:     Thyroid: No thyromegaly.     Trachea: No tracheal deviation.  Cardiovascular:     Rate and Rhythm: Normal rate.  Pulmonary:     Effort: Pulmonary effort is normal.  Abdominal:     Palpations: Abdomen is soft.  Skin:    General: Skin is warm and dry.  Neurological:     Mental Status: She is alert and oriented to person, place, and time.  Psychiatric:        Behavior: Behavior normal.     Ortho Exam patient is amatory with a walker.  She has minimal discomfort with  internal and external rotation of her hip.  Negative straight leg raising 90 degrees.  Specialty Comments:  No specialty comments available.  Imaging: CLINICAL DATA:  Further evaluation of lung nodules noted at the lung apices on the CTA neck from yesterday. Unintentional weight loss.  EXAM: CT CHEST, ABDOMEN, AND PELVIS WITH CONTRAST  TECHNIQUE: Multidetector CT imaging of the chest, abdomen and pelvis was performed following the standard protocol during bolus administration of intravenous contrast.  CONTRAST:  129mL OMNIPAQUE IOHEXOL 300 MG/ML  SOLN  COMPARISON:  CTA neck, 01/09/2020.  FINDINGS: CT CHEST FINDINGS  Cardiovascular: Heart is normal in size and configuration. No pericardial effusion. No coronary artery calcifications. Great vessels are normal in caliber. Minimal aortic atherosclerosis along the  descending portion. Branch vessels are widely patent.  Mediastinum/Nodes: Masslike soft tissue along the anterior mediastinum anterior to the heart, 2.0 x 1.1 cm in size, possibly a mildly enlarged lymph node. No other mediastinal masses. No other evidence of an enlarged lymph node. No neck base or axillary masses or adenopathy. Trachea and esophagus are unremarkable.  Lungs/Pleura: Left lower lobe mass with ill-defined margins, measuring approximately 7.8 cm from superior to inferior by 4.8 x 4.2 cm transversely.  There are numerous bilateral pulmonary nodules consistent with widespread metastatic disease throughout both lungs.  No evidence of pneumonia or pulmonary edema. No pleural effusion or pneumothorax.  Musculoskeletal: Lucent bone lesions. The best defined arises from the transverse process of T5 on the right.  Chest wall: Small nodule/mass in the right breast measuring 7 mm.  CT ABDOMEN PELVIS FINDINGS  Hepatobiliary: 3 cm mass in the peripheral aspect of the right liver lobe demonstrating peripheral nodular enhancement consistent with a hemangioma. No other liver masses or lesions. Normal gallbladder. No bile duct dilation.  Pancreas: Unremarkable. No pancreatic ductal dilatation or surrounding inflammatory changes.  Spleen: Normal in size without focal abnormality.  Adrenals/Urinary Tract: No adrenal masses. Kidneys are normal in size, orientation and position with symmetric enhancement and excretion. 4-5 mm low-attenuation oval mass in the midpole the right kidney, too small to fully characterize, but consistent with a cyst. 1 cm lower pole cyst from the right kidney. No other renal masses, no stones and no hydronephrosis. Normal ureters. Normal bladder.  Stomach/Bowel: Normal stomach. Small bowel and colon are normal in caliber. No masses. No wall thickening or inflammation. Normal appendix visualized.  Vascular/Lymphatic: No significant  vascular findings are present. No enlarged abdominal or pelvic lymph nodes.  Reproductive: Status post hysterectomy. No adnexal masses.  Other: No abdominal wall hernia.  No ascites.  Musculoskeletal: There are lucent/lytic bone lesions. Lesions are noted in the anterior left acetabulum, at the base of the pubis, right ilium, upper sacrum and subtly along the lumbar spine. There is also an area of mixed sclerosis along the anterior right acetabulum.  IMPRESSION: 1. Dominant mass in the left lower lobe consistent with a primary bronchogenic carcinoma. This measures approximately 7.8 x 4.2 x 4.8 cm. 2. Extensive metastatic disease to both lungs with numerous lung nodules throughout all lobes. 2.0 x 1.1 cm soft tissue mass versus a mildly enlarged lymph node in the anterior mediastinum anterior to the heart, possibly metastatic disease. 3. Skeletal metastatic disease with lucent bone lesions, the most definitively seen within the right T5 transverse process, the sacrum, right ilium and left anterior acetabulum/pubis. 4. No other evidence of metastatic disease. 5. No acute findings. 6. 3 cm right liver lobe hemangioma.   Electronically Signed   By: Shanon Brow  Ormond M.D.   On: 01/10/2020 10:10    PMFS History: Patient Active Problem List   Diagnosis Date Noted  . Bone metastasis ([place]) 01/28/2020  . Adenocarcinoma of left lung, stage 4 ([place]) 01/18/2020  . Encounter for antineoplastic chemotherapy 01/18/2020  . Goals of care, counseling/discussion 01/18/2020  . Malignant neoplasm of lower lobe of left lung ([place]) 01/14/2020  . Acute CVA (cerebrovascular accident) ([place]) 01/09/2020  . Intracranial bleeding ([place]) 01/09/2020  . TIA (transient ischemic attack) 01/08/2020  . Lumbar foraminal stenosis 12/30/2019  . Vertigo 09/25/2019  . Sleep apnea   . GERD (gastroesophageal reflux disease)   . Anxiety   . NICM (nonischemic cardiomyopathy) ([place])   . Hypothyroidism   .  Nonischemic cardiomyopathy ([place]) 06/04/2018  . Essential hypertension 06/04/2018  . Left bundle branch block 06/04/2018  . Dyslipidemia 06/04/2018   Past Medical History:  Diagnosis Date  . Anxiety   . Dyslipidemia   . Essential hypertension   . GERD (gastroesophageal reflux disease)   . Hypothyroidism   . Left bundle branch block 06/04/2018  . NICM (nonischemic cardiomyopathy) ([place])   . Nonischemic cardiomyopathy ([place]) 06/04/2018   Ejection fraction 45% in summer 2019  . Sleep apnea     Family History  Problem Relation Age of Onset  . Heart disease Mother   . Stroke Mother   . Heart disease Father   . Stroke Father   . Heart disease Maternal Grandfather   . Heart disease Paternal Grandfather     Past Surgical History:  Procedure Laterality Date  . ABDOMINAL HYSTERECTOMY    . BLADDER NECK RECONSTRUCTION    . BRONCHIAL BIOPSY  01/12/2020   Procedure: BRONCHIAL BIOPSIES;  Surgeon: Garner Nash, DO;  Location: [facility];  Service: Pulmonary;;  . BRONCHIAL BRUSHINGS  01/12/2020   Procedure: BRONCHIAL BRUSHINGS;  Surgeon: Garner Nash, DO;  Location: [facility];  Service: Pulmonary;;  . BRONCHIAL NEEDLE ASPIRATION BIOPSY  01/12/2020   Procedure: BRONCHIAL NEEDLE ASPIRATION BIOPSIES;  Surgeon: Garner Nash, DO;  Location: [facility];  Service: Pulmonary;;  . BRONCHIAL WASHINGS  01/12/2020   Procedure: BRONCHIAL WASHINGS;  Surgeon: Garner Nash, DO;  Location: [facility];  Service: Pulmonary;;  . SHOULDER SURGERY    . TONSILLECTOMY    . VIDEO BRONCHOSCOPY WITH ENDOBRONCHIAL ULTRASOUND  01/12/2020   Procedure: VIDEO BRONCHOSCOPY WITH ENDOBRONCHIAL ULTRASOUND;  Surgeon: Garner Nash, DO;  Location: [facility];  Service: Pulmonary;;   Social History   Occupational History  . Not on file  Tobacco Use  . Smoking status: Never Smoker  . Smokeless tobacco: Never Used  Vaping Use  . Vaping Use: Never used  Substance and Sexual Activity  . Alcohol use: Not  Currently  . Drug use: Never  . Sexual activity: Not on file

## 2020-01-27 NOTE — Telephone Encounter (Signed)
Oral Oncology Patient Advocate Encounter  Prior Authorization for Stefanie Camacho has been approved.    PA# [id number] Effective dates: 05/08/19 through 05/06/20  Patients co-pay is $[id number]  Oral Oncology Clinic will continue to follow.   [place] Patient [name] Phone [phone] Fax [phone] 01/27/2020 10:37 AM

## 2020-01-28 ENCOUNTER — Ambulatory Visit
Admission: RE | Admit: 2020-01-28 | Discharge: 2020-01-28 | Disposition: A | Discharge status: Home / Self Care | Payer: Medicare HMO | Source: Ambulatory Visit | Attending: Radiation Oncology | Admitting: Radiation Oncology

## 2020-01-28 ENCOUNTER — Ambulatory Visit: Payer: Medicare HMO

## 2020-01-28 ENCOUNTER — Telehealth: Payer: Self-pay

## 2020-01-28 ENCOUNTER — Other Ambulatory Visit: Payer: Self-pay

## 2020-01-28 DIAGNOSIS — C7951 Secondary malignant neoplasm of bone: Secondary | ICD-10-CM | POA: Insufficient documentation

## 2020-01-28 DIAGNOSIS — C3432 Malignant neoplasm of lower lobe, left bronchus or lung: Secondary | ICD-10-CM | POA: Diagnosis not present

## 2020-01-28 DIAGNOSIS — Z510 Encounter for antineoplastic radiation therapy: Secondary | ICD-10-CM | POA: Diagnosis not present

## 2020-01-28 DIAGNOSIS — C3491 Malignant neoplasm of unspecified part of right bronchus or lung: Secondary | ICD-10-CM | POA: Diagnosis not present

## 2020-01-28 DIAGNOSIS — C7931 Secondary malignant neoplasm of brain: Secondary | ICD-10-CM | POA: Diagnosis not present

## 2020-01-28 HISTORY — DX: Secondary malignant neoplasm of bone: C79.51

## 2020-01-28 NOTE — Telephone Encounter (Signed)
Oral Oncology Patient Advocate Encounter  Met patient in [place] to complete application for [place] Program in an effort to reduce the patient's out of pocket expense for [place] to $0.    Application completed and faxed to [phone].   [place] phone number for follow up is [phone].   This encounter will be updated until final determination.  [place] Patient [place] Phone [phone] Fax [phone] 01/28/2020 2:56 PM

## 2020-01-29 ENCOUNTER — Other Ambulatory Visit: Payer: Self-pay

## 2020-01-29 ENCOUNTER — Ambulatory Visit: Payer: Medicare HMO

## 2020-01-29 ENCOUNTER — Inpatient Hospital Stay (HOSPITAL_BASED_OUTPATIENT_CLINIC_OR_DEPARTMENT_OTHER): Payer: Medicare HMO | Admitting: Internal Medicine

## 2020-01-29 ENCOUNTER — Ambulatory Visit
Admission: RE | Admit: 2020-01-29 | Discharge: 2020-01-29 | Disposition: A | Discharge status: Home / Self Care | Payer: Medicare HMO | Source: Ambulatory Visit | Attending: Radiation Oncology | Admitting: Radiation Oncology

## 2020-01-29 DIAGNOSIS — C7949 Secondary malignant neoplasm of other parts of nervous system: Secondary | ICD-10-CM | POA: Insufficient documentation

## 2020-01-29 DIAGNOSIS — C3432 Malignant neoplasm of lower lobe, left bronchus or lung: Secondary | ICD-10-CM | POA: Diagnosis not present

## 2020-01-29 DIAGNOSIS — C7951 Secondary malignant neoplasm of bone: Secondary | ICD-10-CM | POA: Diagnosis not present

## 2020-01-29 DIAGNOSIS — C7931 Secondary malignant neoplasm of brain: Secondary | ICD-10-CM | POA: Diagnosis not present

## 2020-01-29 DIAGNOSIS — C7932 Secondary malignant neoplasm of cerebral meninges: Secondary | ICD-10-CM | POA: Insufficient documentation

## 2020-01-29 DIAGNOSIS — Z510 Encounter for antineoplastic radiation therapy: Secondary | ICD-10-CM | POA: Diagnosis not present

## 2020-01-29 HISTORY — DX: Secondary malignant neoplasm of other parts of nervous system: C79.49

## 2020-01-29 HISTORY — DX: Secondary malignant neoplasm of cerebral meninges: C79.32

## 2020-01-29 NOTE — Progress Notes (Signed)
[facility] [place], [place] [address] [phone]   New Patient Evaluation  Date of Service: 01/29/20 Patient Name: Stefanie Camacho Patient MRN: [id number] Patient DOB: 1945-07-26 Provider: Ventura Sellers, MD  Identifying Statement:  Stefanie Camacho is a 74 y.o. 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    Referring Provider: [name], [place],   [id number]  Primary Cancer:  Oncologic History: Oncology History  Adenocarcinoma of left lung, stage 4 ([facility])  01/18/2020 Initial Diagnosis   Adenocarcinoma of left lung, stage 4 ([facility])   01/28/2020 -  Chemotherapy   The patient had [No matching medication found in this treatment plan]  for chemotherapy treatment.      History of Present Illness: The patient's records from the referring physician were obtained and reviewed and the patient interviewed to confirm this HPI.  Stefanie Camacho presented to medical attention on 01/09/20 with sudden onset "marching" left sided facial numbness.  The episode drew suspicion for stroke, and during workup patient was found to have innumerable brain metastases with additional foci of leptomeningeal enhancement secondary to widely metastatic lung cancer.  She returned to baseline and was discharged with [place]; brain disease has since been treated with whole brain radiotherapy with Dr. Lisbeth Renshaw, she is scheduled to complete treatment next week.  She has difficulty with ambulation because of a hip metastasis, but since being treated with RT that pain has improved as has her walking.  Plans to meet with Dr. Julien Nordmann next week to discuss plans for therapeutic targeting of EGFR mutation.    Medications: Current Outpatient Medications on File Prior to Visit  Medication Sig Dispense Refill  . aspirin EC 81 MG  tablet Take 81 mg by mouth daily.    Marland Kitchen atorvastatin (LIPITOR) 10 MG tablet Take 1 tablet (10 mg total) by mouth daily at 6 PM. 30 tablet 2  . FLUoxetine (PROZAC) 10 MG tablet Take 1 tablet (10 mg total) by mouth daily. 30 tablet 3  . levETIRAcetam (KEPPRA) 500 MG tablet Take 1 tablet (500 mg total) by mouth 2 (two) times daily. 60 tablet 1  . levothyroxine (SYNTHROID) 75 MCG tablet Take 1 tablet (75 mcg total) by mouth daily. 30 tablet 1  . losartan (COZAAR) 50 MG tablet Take 1 tablet (50 mg total) by mouth daily. 90 tablet 2  . metoprolol succinate (TOPROL-XL) 50 MG 24 hr tablet Take 1 tablet (50 mg total) by mouth daily. Take with or immediately following a meal. 90 tablet 2  . osimertinib mesylate (TAGRISSO) 80 MG tablet Take 2 tablets (160 mg total) by mouth daily. Days 1-28. 60 tablet 3  . oxyCODONE-acetaminophen (PERCOCET/ROXICET) 5-325 MG tablet Take 1 tablet by mouth every 8 (eight) hours as needed for severe pain. 45 tablet 0  . pantoprazole (PROTONIX) 40 MG tablet Take 1 tablet (40 mg total) by mouth daily. 30 tablet 1  . prochlorperazine (COMPAZINE) 10 MG tablet Take 1 tablet (10 mg total) by mouth every 6 (six) hours as needed for nausea or vomiting. 45 tablet 5  . spironolactone (ALDACTONE) 25 MG tablet Take 0.5 tablets (12.5 mg total) by mouth daily. 45 tablet 3   No current facility-administered medications on file prior to visit.    Allergies: No Known Allergies Past Medical History:  Past Medical History:  Diagnosis Date  . Anxiety   . Dyslipidemia   .  Essential hypertension   . GERD (gastroesophageal reflux disease)   . Hypothyroidism   . Left bundle branch block 06/04/2018  . NICM (nonischemic cardiomyopathy) ([facility])   . Nonischemic cardiomyopathy ([facility]) 06/04/2018   Ejection fraction 45% in summer 2019  . Sleep apnea    Past Surgical History:  Past Surgical History:  Procedure Laterality Date  . ABDOMINAL HYSTERECTOMY    . BLADDER NECK RECONSTRUCTION    . BRONCHIAL  BIOPSY  01/12/2020   Procedure: BRONCHIAL BIOPSIES;  Surgeon: Garner Nash, DO;  Location: [facility];  Service: Pulmonary;;  . BRONCHIAL BRUSHINGS  01/12/2020   Procedure: BRONCHIAL BRUSHINGS;  Surgeon: Garner Nash, DO;  Location: [facility];  Service: Pulmonary;;  . BRONCHIAL NEEDLE ASPIRATION BIOPSY  01/12/2020   Procedure: BRONCHIAL NEEDLE ASPIRATION BIOPSIES;  Surgeon: Garner Nash, DO;  Location: [facility];  Service: Pulmonary;;  . BRONCHIAL WASHINGS  01/12/2020   Procedure: BRONCHIAL WASHINGS;  Surgeon: Garner Nash, DO;  Location: [facility];  Service: Pulmonary;;  . SHOULDER SURGERY    . TONSILLECTOMY    . VIDEO BRONCHOSCOPY WITH ENDOBRONCHIAL ULTRASOUND  01/12/2020   Procedure: VIDEO BRONCHOSCOPY WITH ENDOBRONCHIAL ULTRASOUND;  Surgeon: Garner Nash, DO;  Location: [facility];  Service: Pulmonary;;   Social History:  Social History   Socioeconomic History  . Marital status: Divorced    Spouse name: Not on file  . Number of children: Not on file  . Years of education: Not on file  . Highest education level: Not on file  Occupational History  . Not on file  Tobacco Use  . Smoking status: Never Smoker  . Smokeless tobacco: Never Used  Vaping Use  . Vaping Use: Never used  Substance and Sexual Activity  . Alcohol use: Not Currently  . Drug use: Never  . Sexual activity: Not on file  Other Topics Concern  . Not on file  Social History Narrative  . Not on file   Social Determinants of Health   Financial Resource Strain:   . Difficulty of Paying Living Expenses: Not on file  Food Insecurity:   . Worried About Charity fundraiser in the Last Year: Not on file  . Ran Out of Food in the Last Year: Not on file  Transportation Needs:   . Lack of Transportation (Medical): Not on file  . Lack of Transportation (Non-Medical): Not on file  Physical Activity:   . Days of Exercise per Week: Not on file  . Minutes of Exercise per Session: Not on file   Stress:   . Feeling of Stress : Not on file  Social Connections:   . Frequency of Communication with Friends and Family: Not on file  . Frequency of Social Gatherings with Friends and Family: Not on file  . Attends Religious Services: Not on file  . Active Member of Clubs or Organizations: Not on file  . Attends Archivist Meetings: Not on file  . Marital Status: Not on file  Intimate Partner Violence:   . Fear of Current or Ex-Partner: Not on file  . Emotionally Abused: Not on file  . Physically Abused: Not on file  . Sexually Abused: Not on file   Family History:  Family History  Problem Relation Age of Onset  . Heart disease Mother   . Stroke Mother   . Heart disease Father   . Stroke Father   . Heart disease Maternal Grandfather   . Heart disease Paternal Grandfather  Review of Systems: Constitutional: Doesn't report fevers, chills or abnormal weight loss Eyes: Doesn't report blurriness of vision Ears, nose, mouth, throat, and face: Doesn't report sore throat Respiratory: Doesn't report cough, dyspnea or wheezes Cardiovascular: Doesn't report palpitation, chest discomfort  Gastrointestinal:  Doesn't report nausea, constipation, diarrhea GU: Doesn't report incontinence Skin: Doesn't report skin rashes Neurological: Per HPI Musculoskeletal: Doesn't report joint pain Behavioral/Psych: Doesn't report anxiety  Physical Exam: Vitals:   01/29/20 1525  BP: 107/60  Pulse: 61  Resp: 17  Temp: (!) 97 F (36.1 C)  SpO2: 97%   KPS: 80. General: Alert, cooperative, pleasant, in no acute distress Head: Normal EENT: No conjunctival injection or scleral icterus.  Lungs: Resp effort normal Cardiac: Regular rate Abdomen: Non-distended abdomen Skin: No rashes cyanosis or petechiae. Extremities: No clubbing or edema  Neurologic Exam: Mental Status: Awake, alert, attentive to examiner. Oriented to self and environment. Language is fluent with intact  comprehension.  Cranial Nerves: Visual acuity is grossly normal. Visual fields are full. Extra-ocular movements intact. No ptosis. Face is symmetric Motor: Tone and bulk are normal. Power is full in both arms and legs. Reflexes are symmetric, no pathologic reflexes present.  Sensory: Intact to light touch Gait: Orthopedic limitation  Labs: I have reviewed the data as listed    Component Value Date/Time   NA 138 01/18/2020 1339   K 4.2 01/18/2020 1339   CL 105 01/18/2020 1339   CO2 25 01/18/2020 1339   GLUCOSE 130 (H) 01/18/2020 1339   BUN 18 01/18/2020 1339   CREATININE 0.89 01/18/2020 1339   CALCIUM 10.0 01/18/2020 1339   PROT 7.2 01/18/2020 1339   PROT 6.9 07/17/2018 0934   ALBUMIN 3.2 (L) 01/18/2020 1339   ALBUMIN 4.1 07/17/2018 0934   AST 16 01/18/2020 1339   ALT 18 01/18/2020 1339   ALKPHOS 304 (H) 01/18/2020 1339   BILITOT 0.5 01/18/2020 1339   GFRNONAA >60 01/18/2020 1339   GFRAA >60 01/18/2020 1339   Lab Results  Component Value Date   WBC 7.9 01/18/2020   NEUTROABS 5.1 01/18/2020   HGB 13.6 01/18/2020   HCT 41.5 01/18/2020   MCV 86.5 01/18/2020   PLT 272 01/18/2020    Imaging:  CT ANGIO HEAD W OR WO CONTRAST  Result Date: 01/09/2020 CLINICAL DATA:  Stroke EXAM: CT ANGIOGRAPHY HEAD AND NECK TECHNIQUE: Multidetector CT imaging of the head and neck was performed using the standard protocol during bolus administration of intravenous contrast. Multiplanar CT image reconstructions and MIPs were obtained to evaluate the vascular anatomy. Carotid stenosis measurements (when applicable) are obtained utilizing NASCET criteria, using the distal internal carotid diameter as the denominator. CONTRAST:  64m OMNIPAQUE IOHEXOL 350 MG/ML SOLN COMPARISON:  None. FINDINGS: CTA NECK FINDINGS SKELETON: There is no bony spinal canal stenosis. No lytic or blastic lesion. OTHER NECK: Normal pharynx, larynx and major salivary glands. No cervical lymphadenopathy. Unremarkable thyroid gland.  UPPER CHEST: Innumerable nodules throughout both lungs. AORTIC ARCH: There is no calcific atherosclerosis of the aortic arch. There is no aneurysm, dissection or hemodynamically significant stenosis of the visualized portion of the aorta. Normal variant aortic arch branching pattern with the brachiocephalic and left common carotid arteries sharing a common origin. The visualized proximal subclavian arteries are widely patent. RIGHT CAROTID SYSTEM: Normal without aneurysm, dissection or stenosis. LEFT CAROTID SYSTEM: Normal without aneurysm, dissection or stenosis. VERTEBRAL ARTERIES: Left dominant configuration. Both origins are clearly patent. There is no dissection, occlusion or flow-limiting stenosis to the skull base (V1-V3  segments). CTA HEAD FINDINGS POSTERIOR CIRCULATION: --Vertebral arteries: Normal V4 segments. --Inferior cerebellar arteries: Normal. --Basilar artery: Normal. --Superior cerebellar arteries: Normal. --Posterior cerebral arteries (PCA): Normal. ANTERIOR CIRCULATION: --Intracranial internal carotid arteries: Normal. --Anterior cerebral arteries (ACA): Normal. Both A1 segments are present. Patent anterior communicating artery (a-comm). --Middle cerebral arteries (MCA): Normal. VENOUS SINUSES: As permitted by contrast timing, patent. ANATOMIC VARIANTS: None Review of the MIP images confirms the above findings. IMPRESSION: 1. No emergent large vessel occlusion or high-grade stenosis of the intracranial arteries. 2. Innumerable nodular pulmonary metastases. Electronically Signed   By: Ulyses Jarred M.D.   On: 01/09/2020 05:17   CT HEAD WO CONTRAST  Result Date: 01/08/2020 CLINICAL DATA:  TIA.  Right facial numbness 2 days ago. EXAM: CT HEAD WITHOUT CONTRAST TECHNIQUE: Contiguous axial images were obtained from the base of the skull through the vertex without intravenous contrast. COMPARISON:  None. FINDINGS: Brain: No evidence of acute infarction, hemorrhage, hydrocephalus, extra-axial  collection or mass lesion/mass effect. Vascular: Negative for hyperdense vessel Skull: Negative Sinuses/Orbits: Paranasal sinuses clear. Bilateral cataract extraction. No orbital lesion. Other: None IMPRESSION: Negative CT head Electronically Signed   By: Franchot Gallo M.D.   On: 01/08/2020 13:50   CT ANGIO NECK W OR WO CONTRAST  Result Date: 01/09/2020 CLINICAL DATA:  Stroke EXAM: CT ANGIOGRAPHY HEAD AND NECK TECHNIQUE: Multidetector CT imaging of the head and neck was performed using the standard protocol during bolus administration of intravenous contrast. Multiplanar CT image reconstructions and MIPs were obtained to evaluate the vascular anatomy. Carotid stenosis measurements (when applicable) are obtained utilizing NASCET criteria, using the distal internal carotid diameter as the denominator. CONTRAST:  45m OMNIPAQUE IOHEXOL 350 MG/ML SOLN COMPARISON:  None. FINDINGS: CTA NECK FINDINGS SKELETON: There is no bony spinal canal stenosis. No lytic or blastic lesion. OTHER NECK: Normal pharynx, larynx and major salivary glands. No cervical lymphadenopathy. Unremarkable thyroid gland. UPPER CHEST: Innumerable nodules throughout both lungs. AORTIC ARCH: There is no calcific atherosclerosis of the aortic arch. There is no aneurysm, dissection or hemodynamically significant stenosis of the visualized portion of the aorta. Normal variant aortic arch branching pattern with the brachiocephalic and left common carotid arteries sharing a common origin. The visualized proximal subclavian arteries are widely patent. RIGHT CAROTID SYSTEM: Normal without aneurysm, dissection or stenosis. LEFT CAROTID SYSTEM: Normal without aneurysm, dissection or stenosis. VERTEBRAL ARTERIES: Left dominant configuration. Both origins are clearly patent. There is no dissection, occlusion or flow-limiting stenosis to the skull base (V1-V3 segments). CTA HEAD FINDINGS POSTERIOR CIRCULATION: --Vertebral arteries: Normal V4 segments.  --Inferior cerebellar arteries: Normal. --Basilar artery: Normal. --Superior cerebellar arteries: Normal. --Posterior cerebral arteries (PCA): Normal. ANTERIOR CIRCULATION: --Intracranial internal carotid arteries: Normal. --Anterior cerebral arteries (ACA): Normal. Both A1 segments are present. Patent anterior communicating artery (a-comm). --Middle cerebral arteries (MCA): Normal. VENOUS SINUSES: As permitted by contrast timing, patent. ANATOMIC VARIANTS: None Review of the MIP images confirms the above findings. IMPRESSION: 1. No emergent large vessel occlusion or high-grade stenosis of the intracranial arteries. 2. Innumerable nodular pulmonary metastases. Electronically Signed   By: KUlyses JarredM.D.   On: 01/09/2020 05:17   MR ANGIO HEAD WO CONTRAST  Result Date: 01/09/2020 CLINICAL DATA:  Acute neurologic deficit. Slurred speech and facial numbness EXAM: MRI HEAD WITHOUT CONTRAST MRA HEAD WITHOUT CONTRAST TECHNIQUE: Multiplanar, multiecho pulse sequences of the brain and surrounding structures were obtained without intravenous contrast. Angiographic images of the head were obtained using MRA technique without contrast. COMPARISON:  None. FINDINGS: MRI HEAD  FINDINGS Brain: There is a small amount of hemorrhage, likely subacute, within the left occipital lobe. It is unclear whether this is subarachnoid or intraparenchymal within the cortex. There are areas of hyperintense T2-weighted signal within the left temporal lobe. Normal volume of CSF spaces. No chronic microhemorrhage. Normal midline structures. Vascular: Normal flow voids. Skull and upper cervical spine: Normal marrow signal. Sinuses/Orbits: Negative. Other: None. MRA HEAD FINDINGS POSTERIOR CIRCULATION: --Vertebral arteries: Normal V4 segments. --Inferior cerebellar arteries: Normal. --Basilar artery: Normal. --Superior cerebellar arteries: Normal. --Posterior cerebral arteries: Multifocal stenosis of the distal left PCA. Normal right PCA.  ANTERIOR CIRCULATION: --Intracranial internal carotid arteries: Normal. --Anterior cerebral arteries (ACA): Normal. Both A1 segments are present. Patent anterior communicating artery (a-comm). --Middle cerebral arteries (MCA): Normal. IMPRESSION: 1. Small amount of hemorrhage, likely subacute, within the left occipital lobe. Given the multifocal left PCA stenosis, this probably represents hemorrhage associated with a recent infarct. 2. Areas of hyperintense T2-weighted signal within the left temporal lobe may also be due to prior left PCA ischemic events. 3. Multifocal stenosis of the distal left PCA. Otherwise normal intracranial MRA. Electronically Signed   By: Ulyses Jarred M.D.   On: 01/09/2020 02:00   MR BRAIN WO CONTRAST  Result Date: 01/09/2020 CLINICAL DATA:  Acute neurologic deficit. Slurred speech and facial numbness EXAM: MRI HEAD WITHOUT CONTRAST MRA HEAD WITHOUT CONTRAST TECHNIQUE: Multiplanar, multiecho pulse sequences of the brain and surrounding structures were obtained without intravenous contrast. Angiographic images of the head were obtained using MRA technique without contrast. COMPARISON:  None. FINDINGS: MRI HEAD FINDINGS Brain: There is a small amount of hemorrhage, likely subacute, within the left occipital lobe. It is unclear whether this is subarachnoid or intraparenchymal within the cortex. There are areas of hyperintense T2-weighted signal within the left temporal lobe. Normal volume of CSF spaces. No chronic microhemorrhage. Normal midline structures. Vascular: Normal flow voids. Skull and upper cervical spine: Normal marrow signal. Sinuses/Orbits: Negative. Other: None. MRA HEAD FINDINGS POSTERIOR CIRCULATION: --Vertebral arteries: Normal V4 segments. --Inferior cerebellar arteries: Normal. --Basilar artery: Normal. --Superior cerebellar arteries: Normal. --Posterior cerebral arteries: Multifocal stenosis of the distal left PCA. Normal right PCA. ANTERIOR CIRCULATION: --Intracranial  internal carotid arteries: Normal. --Anterior cerebral arteries (ACA): Normal. Both A1 segments are present. Patent anterior communicating artery (a-comm). --Middle cerebral arteries (MCA): Normal. IMPRESSION: 1. Small amount of hemorrhage, likely subacute, within the left occipital lobe. Given the multifocal left PCA stenosis, this probably represents hemorrhage associated with a recent infarct. 2. Areas of hyperintense T2-weighted signal within the left temporal lobe may also be due to prior left PCA ischemic events. 3. Multifocal stenosis of the distal left PCA. Otherwise normal intracranial MRA. Electronically Signed   By: Ulyses Jarred M.D.   On: 01/09/2020 02:00   MR BRAIN W CONTRAST  Result Date: 01/10/2020 CLINICAL DATA:  Non-small cell lung cancer staging. EXAM: MRI HEAD WITH CONTRAST TECHNIQUE: Multiplanar, multiecho pulse sequences of the brain and surrounding structures were obtained with intravenous contrast. CONTRAST:  55m GADAVIST GADOBUTROL 1 MMOL/ML IV SOLN COMPARISON:  Noncontrast head MRI 01/09/2020 FINDINGS: There are numerous enhancing lesions in both cerebral hemispheres with the largest measuring 1.4 cm in the left temporal lobe (series 5, image 19). There is minimal edema associated with this lesion as well as with a slightly smaller lesion more posteriorly at the left temporo-occipital junction. There are also multiple areas of abnormal leptomeningeal enhancement bilaterally, greatest in the left occipital region. No enhancing lesions are identified in the posterior fossa. An  enhancing right frontoparietal skull lesion measures 1.6 cm (series 5, image 45). IMPRESSION: 1. 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 2. 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 Electronically Signed   By: Logan Bores M.D.   On: 01/10/2020 16:35   CT CHEST ABDOMEN PELVIS W CONTRAST  Result Date: 01/10/2020 CLINICAL DATA:  Further evaluation of  lung nodules noted at the lung apices on the CTA neck from yesterday. Unintentional weight loss. EXAM: CT CHEST, ABDOMEN, AND PELVIS WITH CONTRAST TECHNIQUE: Multidetector CT imaging of the chest, abdomen and pelvis was performed following the standard protocol during bolus administration of intravenous contrast. CONTRAST:  18m OMNIPAQUE IOHEXOL 300 MG/ML  SOLN COMPARISON:  CTA neck, 01/09/2020. FINDINGS: CT CHEST FINDINGS Cardiovascular: Heart is normal in size and configuration. No pericardial effusion. No coronary artery calcifications. Great vessels are normal in caliber. Minimal aortic atherosclerosis along the descending portion. Branch vessels are widely patent. Mediastinum/Nodes: Masslike soft tissue along the anterior mediastinum anterior to the heart, 2.0 x 1.1 cm in size, possibly a mildly enlarged lymph node. No other mediastinal masses. No other evidence of an enlarged lymph node. No neck base or axillary masses or adenopathy. Trachea and esophagus are unremarkable. Lungs/Pleura: Left lower lobe mass with ill-defined margins, measuring approximately 7.8 cm from superior to inferior by 4.8 x 4.2 cm transversely. There are numerous bilateral pulmonary nodules consistent with widespread metastatic disease throughout both lungs. No evidence of pneumonia or pulmonary edema. No pleural effusion or pneumothorax. Musculoskeletal: Lucent bone lesions. The best defined arises from the transverse process of T5 on the right. Chest wall: Small nodule/mass in the right breast measuring 7 mm. CT ABDOMEN PELVIS FINDINGS Hepatobiliary: 3 cm mass in the peripheral aspect of the right liver lobe demonstrating peripheral nodular enhancement consistent with a hemangioma. No other liver masses or lesions. Normal gallbladder. No bile duct dilation. Pancreas: Unremarkable. No pancreatic ductal dilatation or surrounding inflammatory changes. Spleen: Normal in size without focal abnormality. Adrenals/Urinary Tract: No adrenal  masses. Kidneys are normal in size, orientation and position with symmetric enhancement and excretion. 4-5 mm low-attenuation oval mass in the midpole the right kidney, too small to fully characterize, but consistent with a cyst. 1 cm lower pole cyst from the right kidney. No other renal masses, no stones and no hydronephrosis. Normal ureters. Normal bladder. Stomach/Bowel: Normal stomach. Small bowel and colon are normal in caliber. No masses. No wall thickening or inflammation. Normal appendix visualized. Vascular/Lymphatic: No significant vascular findings are present. No enlarged abdominal or pelvic lymph nodes. Reproductive: Status post hysterectomy. No adnexal masses. Other: No abdominal wall hernia.  No ascites. Musculoskeletal: There are lucent/lytic bone lesions. Lesions are noted in the anterior left acetabulum, at the base of the pubis, right ilium, upper sacrum and subtly along the lumbar spine. There is also an area of mixed sclerosis along the anterior right acetabulum. IMPRESSION: 1. Dominant mass in the left lower lobe consistent with a primary bronchogenic carcinoma. This measures approximately 7.8 x 4.2 x 4.8 cm. 2. Extensive metastatic disease to both lungs with numerous lung nodules throughout all lobes. 2.0 x 1.1 cm soft tissue mass versus a mildly enlarged lymph node in the anterior mediastinum anterior to the heart, possibly metastatic disease. 3. Skeletal metastatic disease with lucent bone lesions, the most definitively seen within the right T5 transverse process, the sacrum, right ilium and left anterior acetabulum/pubis. 4. No other evidence of metastatic disease. 5. No acute findings. 6. 3 cm right liver  lobe hemangioma. Electronically Signed   By: Lajean Manes M.D.   On: 01/10/2020 10:10   NM PET Image Initial (PI) Skull Base To Thigh  Result Date: 01/25/2020 CLINICAL DATA:  Initial treatment strategy for non-small cell lung cancer. EXAM: NUCLEAR MEDICINE PET SKULL BASE TO THIGH  TECHNIQUE: 10.1 mCi F-18 FDG was injected intravenously. Full-ring PET imaging was performed from the skull base to thigh after the radiotracer. CT data was obtained and used for attenuation correction and anatomic localization. Fasting blood glucose: 92 mg/dl COMPARISON:  Chest CT from January 10, 2020 FINDINGS: Mediastinal blood pool activity: SUV max 3.49 Liver activity: SUV max 4.44 NECK: Calvarial and brain metastases better demonstrated on recent MRI, not well seen on PET evaluation. No hypermetabolic lymph nodes in the neck. Incidental CT findings: None CHEST: Innumerable pulmonary nodules all with increased FDG uptake to varying degrees. LEFT lower lobe pulmonary mass seen on previous imaging grossly stable in the interval, as measured on the previous exam and on the current exam at approximately 4.8 x 4.2 cm. Partially obscured by atelectasis on the current study with intense metabolic activity (SUVmax = 13.3) RIGHT upper lobe pulmonary nodule 11 mm (image 78, series 4) (SUVmax = 8.2) Numerous additional pulmonary nodules, too numerous to count in the bilateral chest. Not associated with pleural effusion aside from a small amount of fluid adjacent to the dominant mass in the LEFT lower lobe all showing increased FDG uptake as discussed. No adenopathy by size criteria. Mildly enlarged prevascular lymph node on image 66 of series 4 shows a maximum SUV of 3.85. Enlarged pre pericardial lymph node with some calcification measures 10 mm short axis with a maximum SUV of 3.9 Intensely FDG avid area in LEFT thyroid bed corresponds to an area of low attenuation contiguous with the LEFT thyroid lobe (image 47, series 4) maximum SUV of 5.8 Incidental CT findings: Calcified atheromatous plaque in the thoracic aorta. No aneurysmal dilation. No pericardial effusion. Esophagus grossly normal. ABDOMEN/PELVIS: Subtle nodule associated with the inferior LEFT adrenal gland (SUVmax = 6.1 (image 114 of series 4) 7 mm. RIGHT  adrenal gland without discrete mass, uptake difficult to separate from adjacent liver tiny lymph node posterior to the aorta on image 133 of series 4 with an SUV of 4.6) Incidental CT findings: Low-density area in the liver corresponding to hemangioma seen on prior imaging. No acute gastrointestinal process. Calcified atheromatous plaque of the abdominal aorta, mild and without aneurysmal dilation. No ascites. Post hysterectomy. SKELETON: Multifocal areas of bony metastatic disease with marked increased FDG uptake, some areas seen on previous imaging and others occult on CT. T5 RIGHT transverse process on image 67 of series 4 is an example of a lucent lesion that is visible on CT with increased metabolic activity (SUVmax = 12.2) Another area in the spine that is occult on CT shows increased FDG uptake at the T6 vertebral level (image 81 of series 4, only subtle changes can be appreciated in hindsight in this area) (SUVmax = 8.7 Faint uptake in the C3 vertebral body raising the question of metastatic disease in this location with similar uptake also in the T3 vertebral body, activity at C3 is 5.3) Areas of uptake within the RIGHT humerus in the humeral shaft without corresponding CT abnormality (image 34 of series 4 RIGHT-sided rib uptake affecting RIGHT sixth rib most notably on image 89 of series 4 with a maximum SUV of 9.7 subtle changes elsewhere in the ribs bilaterally compatible with multifocal involvement. L1  lucent lesion involving posterior vertebral body measuring 11 mm (image 111 of series 4) (SUVmax = 11.3. This violates posterior cortex. Similar small area of lucency and L2 also showing increased metabolic activity along with the LEFT transverse process at L2. Posterior elements at L2 with spinous process involvement, also spinous process involvement at L5. Lytic lesion in the sacrum on the RIGHT at the S1 level (image 149 of series 4 2.3 cm (SUVmax = 12.3) Multifocal areas of sacral involvement,  RIGHT-sided sacral lesion involves the sacral ala. There is scattered areas in the LEFT sacral ala that are occult on CT or nearly occult, near the S2 neural foramen for instance is an approximately 1 cm lesion with a maximum SUV of 13.9 (image 158 of series 4) Intense FDG uptake involving the RIGHT iliac above the acetabulum in the area of abnormality discussed on the previous CT evaluation. Mixed lytic and sclerotic change of the RIGHT pubic root is similar to the prior study and risk for pathologic fracture based on location with intense metabolic activity extending to the medial wall of the acetabulum on image 175 of series 3 (SUVmax = 10.9) similar activity in the LEFT acetabulum with a frankly destructive lesion at the LEFT pubic root on image 176 of series 4 measuring approximately 1.9 cm. Also with metastatic focus in the RIGHT femoral metaphysis that is occult on CT. Similarly LEFT femoral metaphyseal and trochanteric lesions are occult on CT. LEFT iliac with 3 discrete areas of increased metabolic uptake that show faint CT correlate. Incidental CT findings: RIGHT sixth rib region select region lesion is associated with pathologic fracture which is subacute. Spinal degenerative changes. IMPRESSION: 1. Findings of LEFT large pulmonary mass with innumerable pulmonary nodules and widespread bony metastatic disease. 2. LEFT adrenal uptake suspicious for early adrenal metastases. 3. Calvarial and brain metastases better demonstrated on recent MRI. 4. LEFT thyroid bed uptake may represent a small lymph node adjacent to the thyroid or discrete thyroid lesion. Thyroid ultrasound is suggested for further assessment to determine whether second primary is possible with biopsy as warranted. 5. Bilateral pelvic metastases particularly at the LEFT and RIGHT pubic root and RIGHT acetabulum are risk for pathologic fracture. These results will be called to the ordering clinician or representative by the Radiologist  Assistant, and communication documented in the PACS or [employer]. Electronically Signed   By: Zetta Bills M.D.   On: 01/25/2020 09:41   DG CHEST PORT 1 VIEW  Result Date: 01/12/2020 CLINICAL DATA:  Status post bronchoscopy and biopsy today. EXAM: PORTABLE CHEST 1 VIEW COMPARISON:  CT chest, abdomen and pelvis 01/10/2020. FINDINGS: There is no pneumothorax after bronchoscopy. Innumerable bilateral pulmonary nodules and a left lower lobe mass are again seen. Heart size is normal. No pleural fluid. IMPRESSION: Negative for pneumothorax after bronchoscopy. Left lower lobe mass and innumerable pulmonary nodules as seen on prior CT. Electronically Signed   By: Inge Rise M.D.   On: 01/12/2020 15:03   EEG adult  Result Date: 01/12/2020 Lora Havens, MD     01/12/2020  1:46 PM Patient Name: Avanell Banwart MRN: [id number] Epilepsy Attending: Lora Havens Referring Physician/Provider: Dr. Eleonore Chiquito Date: 01/11/2020 Duration: 23.03 minutes Patient history: 74 year old female with transient right facial numbness.  EEG to evaluate for seizures. Level of alertness: Awake AEDs during EEG study: Keppra Technical aspects: This EEG study was done with scalp electrodes positioned according to the 10-20 International system of electrode placement. Electrical activity was acquired at a  sampling rate of '500Hz'  and reviewed with a high frequency filter of '70Hz'  and a low frequency filter of '1Hz' . EEG data were recorded continuously and digitally stored. Description: The posterior dominant rhythm consists of 10 Hz activity of moderate voltage (25-35 uV) seen predominantly in posterior head regions, symmetric and reactive to eye opening and eye closing. Hyperventilation and photic stimulation were not performed.   IMPRESSION: This study is within normal limits. No seizures or epileptiform discharges were seen throughout the recording. Lora Havens   ECHOCARDIOGRAM COMPLETE  Result Date: 01/09/2020     ECHOCARDIOGRAM REPORT   Patient Name:   ARLY SALMINEN Date of Exam: 01/09/2020 Medical Rec #:  [id number]     Height:       66.0 in Accession #:    [id number]    Weight:       193.6 lb Date of Birth:  13-Mar-1946     BSA:          1.972 m Patient Age:    16 years      BP:           137/66 mmHg Patient Gender: F             HR:           59 bpm. Exam Location:  Inpatient Procedure: 2D Echo, Cardiac Doppler and Color Doppler Indications:    Stroke  History:        Patient has prior history of Echocardiogram examinations, most                 recent 07/07/2019. Arrythmias:LBBB; Risk Factors:Hypertension,                 Dyslipidemia and Sleep Apnea. Non-ischemic cardiomyopathy.  Sonographer:    Clayton Lefort RDCS (AE) Referring Phys: [place]  1. Left ventricular ejection fraction, by estimation, is 55 to 60%. The left ventricle has normal function. The left ventricle has no regional wall motion abnormalities. There is mild left ventricular hypertrophy. Left ventricular diastolic parameters are consistent with Grade I diastolic dysfunction (impaired relaxation).  2. Right ventricular systolic function is normal. The right ventricular size is normal.  3. The mitral valve is grossly normal. Trivial mitral valve regurgitation.  4. The aortic valve is grossly normal. Aortic valve regurgitation is not visualized. No aortic stenosis is present. FINDINGS  Left Ventricle: Left ventricular ejection fraction, by estimation, is 55 to 60%. The left ventricle has normal function. The left ventricle has no regional wall motion abnormalities. The left ventricular internal cavity size was normal in size. There is  mild left ventricular hypertrophy. Left ventricular diastolic parameters are consistent with Grade I diastolic dysfunction (impaired relaxation). Right Ventricle: The right ventricular size is normal. No increase in right ventricular wall thickness. Right ventricular systolic function is normal. Left  Atrium: Left atrial size was normal in size. Right Atrium: Right atrial size was normal in size. Pericardium: There is no evidence of pericardial effusion. Mitral Valve: The mitral valve is grossly normal. Trivial mitral valve regurgitation. MV peak gradient, 2.9 mmHg. The mean mitral valve gradient is 1.0 mmHg. Tricuspid Valve: The tricuspid valve is grossly normal. Tricuspid valve regurgitation is not demonstrated. Aortic Valve: The aortic valve is grossly normal. Aortic valve regurgitation is not visualized. No aortic stenosis is present. Aortic valve mean gradient measures 3.0 mmHg. Aortic valve peak gradient measures 6.2 mmHg. Aortic valve area, by VTI measures 2.10 cm. Pulmonic Valve: The pulmonic valve was  grossly normal. Pulmonic valve regurgitation is trivial. Aorta: The aortic root and ascending aorta are structurally normal, with no evidence of dilitation. IAS/Shunts: The atrial septum is grossly normal.  LEFT VENTRICLE PLAX 2D LVIDd:         4.10 cm  Diastology LVIDs:         3.00 cm  LV e' lateral:   8.05 cm/s LV PW:         1.20 cm  LV E/e' lateral: 6.2 LV IVS:        1.20 cm  LV e' medial:    6.31 cm/s LVOT diam:     2.00 cm  LV E/e' medial:  7.9 LV SV:         50 LV SV Index:   25 LVOT Area:     3.14 cm  RIGHT VENTRICLE             IVC RV Basal diam:  3.00 cm     IVC diam: 0.90 cm RV S prime:     14.10 cm/s TAPSE (M-mode): 2.7 cm LEFT ATRIUM             Index       RIGHT ATRIUM           Index LA diam:        2.60 cm 1.32 cm/m  RA Area:     11.40 cm LA Vol (A2C):   35.9 ml 18.20 ml/m RA Volume:   21.90 ml  11.10 ml/m LA Vol (A4C):   32.8 ml 16.63 ml/m LA Biplane Vol: 36.6 ml 18.56 ml/m  AORTIC VALVE AV Area (Vmax):    1.98 cm AV Area (Vmean):   2.04 cm AV Area (VTI):     2.10 cm AV Vmax:           125.00 cm/s AV Vmean:          86.100 cm/s AV VTI:            0.238 m AV Peak Grad:      6.2 mmHg AV Mean Grad:      3.0 mmHg LVOT Vmax:         78.90 cm/s LVOT Vmean:        56.000 cm/s LVOT  VTI:          0.159 m LVOT/AV VTI ratio: 0.67  AORTA Ao Root diam: 3.20 cm Ao Asc diam:  3.30 cm MITRAL VALVE MV Area (PHT): 2.45 cm    SHUNTS MV Peak grad:  2.9 mmHg    Systemic VTI:  0.16 m MV Mean grad:  1.0 mmHg    Systemic Diam: 2.00 cm MV Vmax:       0.85 m/s MV Vmean:      48.3 cm/s MV Decel Time: 310 msec MV E velocity: 50.10 cm/s MV A velocity: 74.10 cm/s MV E/A ratio:  0.68 Mertie Moores MD Electronically signed by Mertie Moores MD Signature Date/Time: 01/09/2020/1:34:44 PM    Final    VAS [place] LOWER EXTREMITY VENOUS (DVT)  Result Date: 01/10/2020  Lower Venous DVTStudy Indications: Embolic stroke.  Comparison Study: No prior studies. Performing Technologist: Darlin Coco  Examination Guidelines: A complete evaluation includes B-mode imaging, spectral Doppler, color Doppler, and power Doppler as needed of all accessible portions of each vessel. Bilateral testing is considered an integral part of a complete examination. Limited examinations for reoccurring indications may be performed as noted. The reflux portion of the exam is performed with the patient in  reverse Trendelenburg.  +---------+---------------+---------+-----------+----------+--------------+ RIGHT    CompressibilityPhasicitySpontaneityPropertiesThrombus Aging +---------+---------------+---------+-----------+----------+--------------+ CFV      Full           Yes      Yes                                 +---------+---------------+---------+-----------+----------+--------------+ SFJ      Full                                                        +---------+---------------+---------+-----------+----------+--------------+ FV Prox  Full                                                        +---------+---------------+---------+-----------+----------+--------------+ FV Mid   Full                                                        +---------+---------------+---------+-----------+----------+--------------+ FV  DistalFull                                                        +---------+---------------+---------+-----------+----------+--------------+ PFV      Full                                                        +---------+---------------+---------+-----------+----------+--------------+ POP      Full           Yes      Yes                                 +---------+---------------+---------+-----------+----------+--------------+ PTV      Full                                                        +---------+---------------+---------+-----------+----------+--------------+ PERO     Full                                                        +---------+---------------+---------+-----------+----------+--------------+   +---------+---------------+---------+-----------+----------+--------------+ LEFT     CompressibilityPhasicitySpontaneityPropertiesThrombus Aging +---------+---------------+---------+-----------+----------+--------------+ CFV      Full           Yes      Yes                                 +---------+---------------+---------+-----------+----------+--------------+  SFJ      Full                                                        +---------+---------------+---------+-----------+----------+--------------+ FV Prox  Full                                                        +---------+---------------+---------+-----------+----------+--------------+ FV Mid   Full                                                        +---------+---------------+---------+-----------+----------+--------------+ FV DistalFull                                                        +---------+---------------+---------+-----------+----------+--------------+ PFV      Full                                                        +---------+---------------+---------+-----------+----------+--------------+ POP      Full           Yes      Yes                                  +---------+---------------+---------+-----------+----------+--------------+ PTV      Full                                                        +---------+---------------+---------+-----------+----------+--------------+ PERO     Full                                                        +---------+---------------+---------+-----------+----------+--------------+     Summary: RIGHT: - There is no evidence of deep vein thrombosis in the lower extremity.  - No cystic structure found in the popliteal fossa.  LEFT: - There is no evidence of deep vein thrombosis in the lower extremity.  - No cystic structure found in the popliteal fossa.  *See table(s) above for measurements and observations. Electronically signed by Ruta Hinds MD on 01/10/2020 at 10:04:29 AM.    Final     Assessment/Plan Brain Metastases Leptomeningeal Metastases  Brienna Fullenwider is clinically stable today, now having almost completed whole brain radiation for widely metastatic and leptomeningeal lung adenocarcinoma, EGFR mutant.  We are encouraged by plans to initiate therapy with Osimertinib, given recent data on its potential efficacy in CNS and even CSF compartments.  Will defer spine neuraxis imaging for now given lack of localizing symptomatology.  We recommended continuing Keppra 558m BID due to remaining suspicion for focal seizure given aspects of presentation history.    We spent twenty additional minutes teaching regarding the natural history, biology, and historical experience in the treatment of neurologic complications of cancer.   We appreciate the opportunity to participate in the care of VShakea Isip  We ask that VHermine Feriareturn to clinic in 3 months following next brain MRI, or sooner as needed.  All questions were answered. The patient knows to call the clinic with any problems, questions or concerns. No barriers to learning were detected.  The total time spent in the  encounter was 40 minutes and more than 50% was on counseling and review of test results   ZVentura Sellers MD Medical Director of Neuro-Oncology [facility]at [place]09/24/21 4:19 PM

## 2020-01-30 NOTE — Progress Notes (Signed)
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  Molecular Biomarkers:   PRIOR THERAPY: None   CURRENT THERAPY:  1) Whole brain radiation and radiotherapy to the osseous metastases under the care of Dr. Lisbeth Renshaw. Last treatment expected on 02/02/20 2) Tagrisso 80 mg p.o. daily. First dose on 02/01/20.   INTERVAL HISTORY: Titilayo Hagans 74 y.o. female returns to the clinic today for a follow-up visit.  The patient was recently diagnosed with stage IV adenocarcinoma.  She is positive for EGFR mutation which makes her a candidate for oral chemotherapy with Tagrisso.  The patient is unable to afford the copay for the treatment. She will likely need to obtain assistance for tagrisso. The pharmacy team is presently working on this. We are hoping to start her treatment today.    In the interval since her last appointment, the patient met with her orthopedic physician, Dr. Lorin Mercy, regarding her osseous metastases in her hip.  He recommends that she ambulate with a walker to reduce the risk of fracture.  She has a follow-up visit with him next month.  Additionally, the patient is currently undergoing radiotherapy to these osseous metastases, brain metastasis, and the primary lung lesion and is expected to complete her last radiation treatment on 02/02/2020.   The patient also had a visit with Dr. Mickeal Skinner, neuro oncologist, for her leptomeningeal disease last week on 01/29/2020.  The patient is currently being treated with Keppra for seizures.  Otherwise the patient denies any recent fever or night sweats. She has  gradually lost a few pounds at every appointment. She reports her breathing feels "normal" except she sometimes feels pressing in her chest. She was having dry heaving this morning that resolved with her anti-emetic. Her pain complaint is that she has felt dizzy periodically since Saturday. She has a history of vertigo in which she underwent physical therapy last year. She also used meclizine in the past for this. The vertigo feels different this time and also feels like lightheadedness. She also notes she had cataract surgery in May and June 2021 and her vision was normal at that time. She is now reporting blurry vision for her far away vision and occasional black floaters. She denies any  vomiting, diarrhea, or constipation.  She is here today for evaluation and repeat blood work.    MEDICAL HISTORY: Past Medical History:  Diagnosis Date  . Anxiety   . Dyslipidemia   . Essential hypertension   . GERD (gastroesophageal reflux disease)   . Hypothyroidism   . Left bundle branch block 06/04/2018  . NICM (nonischemic cardiomyopathy) ([facility])   . Nonischemic cardiomyopathy ([place]) 06/04/2018   Ejection fraction 45% in summer 2019  . Sleep apnea     ALLERGIES:  has No Known Allergies.  MEDICATIONS:  Current Outpatient Medications  Medication Sig Dispense Refill  . aspirin EC 81 MG tablet Take 81 mg by mouth daily.    Marland Kitchen atorvastatin (LIPITOR) 10 MG tablet Take 1 tablet (10 mg total) by mouth daily at 6 PM. 30 tablet 2  . FLUoxetine (PROZAC) 10 MG tablet Take 1 tablet (10 mg total) by mouth daily. 30 tablet  3  . levETIRAcetam (KEPPRA) 500 MG tablet Take 1 tablet (500 mg total) by mouth 2 (two) times daily. 60 tablet 1  . levothyroxine (SYNTHROID) 75 MCG tablet Take 1 tablet (75 mcg total) by mouth daily. 30 tablet 1  . losartan (COZAAR) 50 MG tablet Take 1 tablet (50 mg total) by mouth daily. 90 tablet 2  . metoprolol succinate (TOPROL-XL) 50 MG 24 hr tablet Take 1 tablet (50 mg total) by mouth  daily. Take with or immediately following a meal. 90 tablet 2  . oxyCODONE-acetaminophen (PERCOCET/ROXICET) 5-325 MG tablet Take 1 tablet by mouth every 8 (eight) hours as needed for severe pain. 45 tablet 0  . pantoprazole (PROTONIX) 40 MG tablet Take 1 tablet (40 mg total) by mouth daily. 30 tablet 1  . prochlorperazine (COMPAZINE) 10 MG tablet Take 1 tablet (10 mg total) by mouth every 6 (six) hours as needed for nausea or vomiting. 45 tablet 5  . spironolactone (ALDACTONE) 25 MG tablet Take 0.5 tablets (12.5 mg total) by mouth daily. 45 tablet 3  . dexamethasone (DECADRON) 4 MG tablet Please take 1 tablet twice a day for 1 week, followed by 1 tablet per day for 1 week, followed by 1/2 a tablet for 1 week 26 tablet 0  . osimertinib mesylate (TAGRISSO) 80 MG tablet Take 2 tablets (160 mg total) by mouth daily. Days 1-28. (Patient not taking: Reported on 02/01/2020) 60 tablet 3   No current facility-administered medications for this visit.    SURGICAL HISTORY:  Past Surgical History:  Procedure Laterality Date  . ABDOMINAL HYSTERECTOMY    . BLADDER NECK RECONSTRUCTION    . BRONCHIAL BIOPSY  01/12/2020   Procedure: BRONCHIAL BIOPSIES;  Surgeon: Josephine Igo, DO;  Location: [facility];  Service: Pulmonary;;  . BRONCHIAL BRUSHINGS  01/12/2020   Procedure: BRONCHIAL BRUSHINGS;  Surgeon: Josephine Igo, DO;  Location: [facility];  Service: Pulmonary;;  . BRONCHIAL NEEDLE ASPIRATION BIOPSY  01/12/2020   Procedure: BRONCHIAL NEEDLE ASPIRATION BIOPSIES;  Surgeon: Josephine Igo, DO;  Location: [facility];  Service: Pulmonary;;  . BRONCHIAL WASHINGS  01/12/2020   Procedure: BRONCHIAL WASHINGS;  Surgeon: Josephine Igo, DO;  Location: [facility];  Service: Pulmonary;;  . SHOULDER SURGERY    . TONSILLECTOMY    . VIDEO BRONCHOSCOPY WITH ENDOBRONCHIAL ULTRASOUND  01/12/2020   Procedure: VIDEO BRONCHOSCOPY WITH ENDOBRONCHIAL ULTRASOUND;  Surgeon: Josephine Igo, DO;  Location: [facility];   Service: Pulmonary;;    REVIEW OF SYSTEMS:   Review of Systems  Constitutional: Positive for fatigue, weight loss, and generalized weakness. Negative for appetite change, chills,  And fever.  HENT:   Negative for mouth sores, nosebleeds, sore throat and trouble swallowing.   Eyes: Positive for bilateral blurry vision with distance and black "floaters". Negative for eye problems and icterus.  Respiratory: Negative for cough, hemoptysis, shortness of breath and wheezing.   Cardiovascular: Negative for chest pain and leg swelling.  Gastrointestinal: Positive for nausea. Negative for abdominal pain, constipation (resolved), diarrhea, and vomiting.  Genitourinary: Negative for bladder incontinence, difficulty urinating, dysuria, frequency and hematuria.   Musculoskeletal: Positive for hip pain. Negative for back pain, gait problem, neck pain and neck stiffness.  Skin: Negative for itching and rash.  Neurological: Positive for dizziness. Negative for extremity weakness, gait problem, headaches, light-headedness and seizures.  Hematological: Negative for adenopathy. Does not bruise/bleed easily.  Psychiatric/Behavioral: Negative for confusion, depression and sleep disturbance. The patient is not nervous/anxious.     PHYSICAL  EXAMINATION:  Blood pressure 100/64, pulse 61, temperature (!) 97 F (36.1 C), temperature source Tympanic, resp. rate 18, height $RemoveBe'5\' 6"'CiFVgPoKp$  (1.676 m), weight 190 lb 12.8 oz (86.5 kg), SpO2 97 %.  ECOG PERFORMANCE STATUS: 1 - Symptomatic but completely ambulatory  Physical Exam  Constitutional: Oriented to person, place, and time and well-developed, well-nourished, and in no distress. HENT:  Head: Normocephalic and atraumatic.  Mouth/Throat: Oropharynx is clear and moist. No oropharyngeal exudate.  Eyes: Conjunctivae are normal. Right eye exhibits no discharge. Left eye exhibits no discharge. No scleral icterus.  Neck: Normal range of motion. Neck supple.  Cardiovascular:  Normal rate, regular rhythm, normal heart sounds and intact distal pulses.   Pulmonary/Chest: Effort normal and breath sounds normal. No respiratory distress. No wheezes. No rales.  Abdominal: Soft. Bowel sounds are normal. Exhibits no distension and no mass. There is no tenderness.  Musculoskeletal: Normal range of motion. Exhibits no edema.  Lymphadenopathy:    No cervical adenopathy.  Neurological: Alert and oriented to person, place, and time. Exhibits normal muscle tone. Examined in the wheelchair.  Skin: Skin is warm and dry. No rash noted. Not diaphoretic. No erythema. No pallor.  Psychiatric: Mood, memory and judgment normal.  Vitals reviewed.  LABORATORY DATA: Lab Results  Component Value Date   WBC 4.7 02/01/2020   HGB 13.8 02/01/2020   HCT 42.6 02/01/2020   MCV 87.5 02/01/2020   PLT 203 02/01/2020      Chemistry      Component Value Date/Time   NA 139 02/01/2020 1419   K 4.6 02/01/2020 1419   CL 103 02/01/2020 1419   CO2 33 (H) 02/01/2020 1419   BUN 17 02/01/2020 1419   CREATININE 0.95 02/01/2020 1419      Component Value Date/Time   CALCIUM 10.1 02/01/2020 1419   ALKPHOS 286 (H) 02/01/2020 1419   AST 13 (L) 02/01/2020 1419   ALT 14 02/01/2020 1419   BILITOT 0.5 02/01/2020 1419       RADIOGRAPHIC STUDIES:  CT ANGIO HEAD W OR WO CONTRAST  Result Date: 01/09/2020 CLINICAL DATA:  Stroke EXAM: CT ANGIOGRAPHY HEAD AND NECK TECHNIQUE: Multidetector CT imaging of the head and neck was performed using the standard protocol during bolus administration of intravenous contrast. Multiplanar CT image reconstructions and MIPs were obtained to evaluate the vascular anatomy. Carotid stenosis measurements (when applicable) are obtained utilizing NASCET criteria, using the distal internal carotid diameter as the denominator. CONTRAST:  80mL OMNIPAQUE IOHEXOL 350 MG/ML SOLN COMPARISON:  None. FINDINGS: CTA NECK FINDINGS SKELETON: There is no bony spinal canal stenosis. No lytic or  blastic lesion. OTHER NECK: Normal pharynx, larynx and major salivary glands. No cervical lymphadenopathy. Unremarkable thyroid gland. UPPER CHEST: Innumerable nodules throughout both lungs. AORTIC ARCH: There is no calcific atherosclerosis of the aortic arch. There is no aneurysm, dissection or hemodynamically significant stenosis of the visualized portion of the aorta. Normal variant aortic arch branching pattern with the brachiocephalic and left common carotid arteries sharing a common origin. The visualized proximal subclavian arteries are widely patent. RIGHT CAROTID SYSTEM: Normal without aneurysm, dissection or stenosis. LEFT CAROTID SYSTEM: Normal without aneurysm, dissection or stenosis. VERTEBRAL ARTERIES: Left dominant configuration. Both origins are clearly patent. There is no dissection, occlusion or flow-limiting stenosis to the skull base (V1-V3 segments). CTA HEAD FINDINGS POSTERIOR CIRCULATION: --Vertebral arteries: Normal V4 segments. --Inferior cerebellar arteries: Normal. --Basilar artery: Normal. --Superior cerebellar arteries: Normal. --Posterior cerebral arteries (PCA): Normal. ANTERIOR CIRCULATION: --Intracranial internal carotid arteries: Normal. --Anterior  cerebral arteries (ACA): Normal. Both A1 segments are present. Patent anterior communicating artery (a-comm). --Middle cerebral arteries (MCA): Normal. VENOUS SINUSES: As permitted by contrast timing, patent. ANATOMIC VARIANTS: None Review of the MIP images confirms the above findings. IMPRESSION: 1. No emergent large vessel occlusion or high-grade stenosis of the intracranial arteries. 2. Innumerable nodular pulmonary metastases. Electronically Signed   By: Ulyses Jarred M.D.   On: 01/09/2020 05:17   CT HEAD WO CONTRAST  Result Date: 01/08/2020 CLINICAL DATA:  TIA.  Right facial numbness 2 days ago. EXAM: CT HEAD WITHOUT CONTRAST TECHNIQUE: Contiguous axial images were obtained from the base of the skull through the vertex without  intravenous contrast. COMPARISON:  None. FINDINGS: Brain: No evidence of acute infarction, hemorrhage, hydrocephalus, extra-axial collection or mass lesion/mass effect. Vascular: Negative for hyperdense vessel Skull: Negative Sinuses/Orbits: Paranasal sinuses clear. Bilateral cataract extraction. No orbital lesion. Other: None IMPRESSION: Negative CT head Electronically Signed   By: Franchot Gallo M.D.   On: 01/08/2020 13:50   CT ANGIO NECK W OR WO CONTRAST  Result Date: 01/09/2020 CLINICAL DATA:  Stroke EXAM: CT ANGIOGRAPHY HEAD AND NECK TECHNIQUE: Multidetector CT imaging of the head and neck was performed using the standard protocol during bolus administration of intravenous contrast. Multiplanar CT image reconstructions and MIPs were obtained to evaluate the vascular anatomy. Carotid stenosis measurements (when applicable) are obtained utilizing NASCET criteria, using the distal internal carotid diameter as the denominator. CONTRAST:  45mL OMNIPAQUE IOHEXOL 350 MG/ML SOLN COMPARISON:  None. FINDINGS: CTA NECK FINDINGS SKELETON: There is no bony spinal canal stenosis. No lytic or blastic lesion. OTHER NECK: Normal pharynx, larynx and major salivary glands. No cervical lymphadenopathy. Unremarkable thyroid gland. UPPER CHEST: Innumerable nodules throughout both lungs. AORTIC ARCH: There is no calcific atherosclerosis of the aortic arch. There is no aneurysm, dissection or hemodynamically significant stenosis of the visualized portion of the aorta. Normal variant aortic arch branching pattern with the brachiocephalic and left common carotid arteries sharing a common origin. The visualized proximal subclavian arteries are widely patent. RIGHT CAROTID SYSTEM: Normal without aneurysm, dissection or stenosis. LEFT CAROTID SYSTEM: Normal without aneurysm, dissection or stenosis. VERTEBRAL ARTERIES: Left dominant configuration. Both origins are clearly patent. There is no dissection, occlusion or flow-limiting  stenosis to the skull base (V1-V3 segments). CTA HEAD FINDINGS POSTERIOR CIRCULATION: --Vertebral arteries: Normal V4 segments. --Inferior cerebellar arteries: Normal. --Basilar artery: Normal. --Superior cerebellar arteries: Normal. --Posterior cerebral arteries (PCA): Normal. ANTERIOR CIRCULATION: --Intracranial internal carotid arteries: Normal. --Anterior cerebral arteries (ACA): Normal. Both A1 segments are present. Patent anterior communicating artery (a-comm). --Middle cerebral arteries (MCA): Normal. VENOUS SINUSES: As permitted by contrast timing, patent. ANATOMIC VARIANTS: None Review of the MIP images confirms the above findings. IMPRESSION: 1. No emergent large vessel occlusion or high-grade stenosis of the intracranial arteries. 2. Innumerable nodular pulmonary metastases. Electronically Signed   By: Ulyses Jarred M.D.   On: 01/09/2020 05:17   MR ANGIO HEAD WO CONTRAST  Result Date: 01/09/2020 CLINICAL DATA:  Acute neurologic deficit. Slurred speech and facial numbness EXAM: MRI HEAD WITHOUT CONTRAST MRA HEAD WITHOUT CONTRAST TECHNIQUE: Multiplanar, multiecho pulse sequences of the brain and surrounding structures were obtained without intravenous contrast. Angiographic images of the head were obtained using MRA technique without contrast. COMPARISON:  None. FINDINGS: MRI HEAD FINDINGS Brain: There is a small amount of hemorrhage, likely subacute, within the left occipital lobe. It is unclear whether this is subarachnoid or intraparenchymal within the cortex. There are areas of hyperintense T2-weighted signal  within the left temporal lobe. Normal volume of CSF spaces. No chronic microhemorrhage. Normal midline structures. Vascular: Normal flow voids. Skull and upper cervical spine: Normal marrow signal. Sinuses/Orbits: Negative. Other: None. MRA HEAD FINDINGS POSTERIOR CIRCULATION: --Vertebral arteries: Normal V4 segments. --Inferior cerebellar arteries: Normal. --Basilar artery: Normal. --Superior  cerebellar arteries: Normal. --Posterior cerebral arteries: Multifocal stenosis of the distal left PCA. Normal right PCA. ANTERIOR CIRCULATION: --Intracranial internal carotid arteries: Normal. --Anterior cerebral arteries (ACA): Normal. Both A1 segments are present. Patent anterior communicating artery (a-comm). --Middle cerebral arteries (MCA): Normal. IMPRESSION: 1. Small amount of hemorrhage, likely subacute, within the left occipital lobe. Given the multifocal left PCA stenosis, this probably represents hemorrhage associated with a recent infarct. 2. Areas of hyperintense T2-weighted signal within the left temporal lobe may also be due to prior left PCA ischemic events. 3. Multifocal stenosis of the distal left PCA. Otherwise normal intracranial MRA. Electronically Signed   By: Ulyses Jarred M.D.   On: 01/09/2020 02:00   MR BRAIN WO CONTRAST  Result Date: 01/09/2020 CLINICAL DATA:  Acute neurologic deficit. Slurred speech and facial numbness EXAM: MRI HEAD WITHOUT CONTRAST MRA HEAD WITHOUT CONTRAST TECHNIQUE: Multiplanar, multiecho pulse sequences of the brain and surrounding structures were obtained without intravenous contrast. Angiographic images of the head were obtained using MRA technique without contrast. COMPARISON:  None. FINDINGS: MRI HEAD FINDINGS Brain: There is a small amount of hemorrhage, likely subacute, within the left occipital lobe. It is unclear whether this is subarachnoid or intraparenchymal within the cortex. There are areas of hyperintense T2-weighted signal within the left temporal lobe. Normal volume of CSF spaces. No chronic microhemorrhage. Normal midline structures. Vascular: Normal flow voids. Skull and upper cervical spine: Normal marrow signal. Sinuses/Orbits: Negative. Other: None. MRA HEAD FINDINGS POSTERIOR CIRCULATION: --Vertebral arteries: Normal V4 segments. --Inferior cerebellar arteries: Normal. --Basilar artery: Normal. --Superior cerebellar arteries: Normal.  --Posterior cerebral arteries: Multifocal stenosis of the distal left PCA. Normal right PCA. ANTERIOR CIRCULATION: --Intracranial internal carotid arteries: Normal. --Anterior cerebral arteries (ACA): Normal. Both A1 segments are present. Patent anterior communicating artery (a-comm). --Middle cerebral arteries (MCA): Normal. IMPRESSION: 1. Small amount of hemorrhage, likely subacute, within the left occipital lobe. Given the multifocal left PCA stenosis, this probably represents hemorrhage associated with a recent infarct. 2. Areas of hyperintense T2-weighted signal within the left temporal lobe may also be due to prior left PCA ischemic events. 3. Multifocal stenosis of the distal left PCA. Otherwise normal intracranial MRA. Electronically Signed   By: Ulyses Jarred M.D.   On: 01/09/2020 02:00   MR BRAIN W CONTRAST  Result Date: 01/10/2020 CLINICAL DATA:  Non-small cell lung cancer staging. EXAM: MRI HEAD WITH CONTRAST TECHNIQUE: Multiplanar, multiecho pulse sequences of the brain and surrounding structures were obtained with intravenous contrast. CONTRAST:  37mL GADAVIST GADOBUTROL 1 MMOL/ML IV SOLN COMPARISON:  Noncontrast head MRI 01/09/2020 FINDINGS: There are numerous enhancing lesions in both cerebral hemispheres with the largest measuring 1.4 cm in the left temporal lobe (series 5, image 19). There is minimal edema associated with this lesion as well as with a slightly smaller lesion more posteriorly at the left temporo-occipital junction. There are also multiple areas of abnormal leptomeningeal enhancement bilaterally, greatest in the left occipital region. No enhancing lesions are identified in the posterior fossa. An enhancing right frontoparietal skull lesion measures 1.6 cm (series 5, image 45). IMPRESSION: 1. 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 2. 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 Electronically Signed  By: Logan Bores  M.D.   On: 01/10/2020 16:35   CT CHEST ABDOMEN PELVIS W CONTRAST  Result Date: 01/10/2020 CLINICAL DATA:  Further evaluation of lung nodules noted at the lung apices on the CTA neck from yesterday. Unintentional weight loss. EXAM: CT CHEST, ABDOMEN, AND PELVIS WITH CONTRAST TECHNIQUE: Multidetector CT imaging of the chest, abdomen and pelvis was performed following the standard protocol during bolus administration of intravenous contrast. CONTRAST:  164mL OMNIPAQUE IOHEXOL 300 MG/ML  SOLN COMPARISON:  CTA neck, 01/09/2020. FINDINGS: CT CHEST FINDINGS Cardiovascular: Heart is normal in size and configuration. No pericardial effusion. No coronary artery calcifications. Great vessels are normal in caliber. Minimal aortic atherosclerosis along the descending portion. Branch vessels are widely patent. Mediastinum/Nodes: Masslike soft tissue along the anterior mediastinum anterior to the heart, 2.0 x 1.1 cm in size, possibly a mildly enlarged lymph node. No other mediastinal masses. No other evidence of an enlarged lymph node. No neck base or axillary masses or adenopathy. Trachea and esophagus are unremarkable. Lungs/Pleura: Left lower lobe mass with ill-defined margins, measuring approximately 7.8 cm from superior to inferior by 4.8 x 4.2 cm transversely. There are numerous bilateral pulmonary nodules consistent with widespread metastatic disease throughout both lungs. No evidence of pneumonia or pulmonary edema. No pleural effusion or pneumothorax. Musculoskeletal: Lucent bone lesions. The best defined arises from the transverse process of T5 on the right. Chest wall: Small nodule/mass in the right breast measuring 7 mm. CT ABDOMEN PELVIS FINDINGS Hepatobiliary: 3 cm mass in the peripheral aspect of the right liver lobe demonstrating peripheral nodular enhancement consistent with a hemangioma. No other liver masses or lesions. Normal gallbladder. No bile duct dilation. Pancreas: Unremarkable. No pancreatic ductal  dilatation or surrounding inflammatory changes. Spleen: Normal in size without focal abnormality. Adrenals/Urinary Tract: No adrenal masses. Kidneys are normal in size, orientation and position with symmetric enhancement and excretion. 4-5 mm low-attenuation oval mass in the midpole the right kidney, too small to fully characterize, but consistent with a cyst. 1 cm lower pole cyst from the right kidney. No other renal masses, no stones and no hydronephrosis. Normal ureters. Normal bladder. Stomach/Bowel: Normal stomach. Small bowel and colon are normal in caliber. No masses. No wall thickening or inflammation. Normal appendix visualized. Vascular/Lymphatic: No significant vascular findings are present. No enlarged abdominal or pelvic lymph nodes. Reproductive: Status post hysterectomy. No adnexal masses. Other: No abdominal wall hernia.  No ascites. Musculoskeletal: There are lucent/lytic bone lesions. Lesions are noted in the anterior left acetabulum, at the base of the pubis, right ilium, upper sacrum and subtly along the lumbar spine. There is also an area of mixed sclerosis along the anterior right acetabulum. IMPRESSION: 1. Dominant mass in the left lower lobe consistent with a primary bronchogenic carcinoma. This measures approximately 7.8 x 4.2 x 4.8 cm. 2. Extensive metastatic disease to both lungs with numerous lung nodules throughout all lobes. 2.0 x 1.1 cm soft tissue mass versus a mildly enlarged lymph node in the anterior mediastinum anterior to the heart, possibly metastatic disease. 3. Skeletal metastatic disease with lucent bone lesions, the most definitively seen within the right T5 transverse process, the sacrum, right ilium and left anterior acetabulum/pubis. 4. No other evidence of metastatic disease. 5. No acute findings. 6. 3 cm right liver lobe hemangioma. Electronically Signed   By: Lajean Manes M.D.   On: 01/10/2020 10:10   NM PET Image Initial (PI) Skull Base To Thigh  Result Date:  01/25/2020 CLINICAL DATA:  Initial  treatment strategy for non-small cell lung cancer. EXAM: NUCLEAR MEDICINE PET SKULL BASE TO THIGH TECHNIQUE: 10.1 mCi F-18 FDG was injected intravenously. Full-ring PET imaging was performed from the skull base to thigh after the radiotracer. CT data was obtained and used for attenuation correction and anatomic localization. Fasting blood glucose: 92 mg/dl COMPARISON:  Chest CT from January 10, 2020 FINDINGS: Mediastinal blood pool activity: SUV max 3.49 Liver activity: SUV max 4.44 NECK: Calvarial and brain metastases better demonstrated on recent MRI, not well seen on PET evaluation. No hypermetabolic lymph nodes in the neck. Incidental CT findings: None CHEST: Innumerable pulmonary nodules all with increased FDG uptake to varying degrees. LEFT lower lobe pulmonary mass seen on previous imaging grossly stable in the interval, as measured on the previous exam and on the current exam at approximately 4.8 x 4.2 cm. Partially obscured by atelectasis on the current study with intense metabolic activity (SUVmax = 13.3) RIGHT upper lobe pulmonary nodule 11 mm (image 78, series 4) (SUVmax = 8.2) Numerous additional pulmonary nodules, too numerous to count in the bilateral chest. Not associated with pleural effusion aside from a small amount of fluid adjacent to the dominant mass in the LEFT lower lobe all showing increased FDG uptake as discussed. No adenopathy by size criteria. Mildly enlarged prevascular lymph node on image 66 of series 4 shows a maximum SUV of 3.85. Enlarged pre pericardial lymph node with some calcification measures 10 mm short axis with a maximum SUV of 3.9 Intensely FDG avid area in LEFT thyroid bed corresponds to an area of low attenuation contiguous with the LEFT thyroid lobe (image 47, series 4) maximum SUV of 5.8 Incidental CT findings: Calcified atheromatous plaque in the thoracic aorta. No aneurysmal dilation. No pericardial effusion. Esophagus grossly  normal. ABDOMEN/PELVIS: Subtle nodule associated with the inferior LEFT adrenal gland (SUVmax = 6.1 (image 114 of series 4) 7 mm. RIGHT adrenal gland without discrete mass, uptake difficult to separate from adjacent liver tiny lymph node posterior to the aorta on image 133 of series 4 with an SUV of 4.6) Incidental CT findings: Low-density area in the liver corresponding to hemangioma seen on prior imaging. No acute gastrointestinal process. Calcified atheromatous plaque of the abdominal aorta, mild and without aneurysmal dilation. No ascites. Post hysterectomy. SKELETON: Multifocal areas of bony metastatic disease with marked increased FDG uptake, some areas seen on previous imaging and others occult on CT. T5 RIGHT transverse process on image 67 of series 4 is an example of a lucent lesion that is visible on CT with increased metabolic activity (SUVmax = 12.2) Another area in the spine that is occult on CT shows increased FDG uptake at the T6 vertebral level (image 81 of series 4, only subtle changes can be appreciated in hindsight in this area) (SUVmax = 8.7 Faint uptake in the C3 vertebral body raising the question of metastatic disease in this location with similar uptake also in the T3 vertebral body, activity at C3 is 5.3) Areas of uptake within the RIGHT humerus in the humeral shaft without corresponding CT abnormality (image 34 of series 4 RIGHT-sided rib uptake affecting RIGHT sixth rib most notably on image 89 of series 4 with a maximum SUV of 9.7 subtle changes elsewhere in the ribs bilaterally compatible with multifocal involvement. L1 lucent lesion involving posterior vertebral body measuring 11 mm (image 111 of series 4) (SUVmax = 11.3. This violates posterior cortex. Similar small area of lucency and L2 also showing increased metabolic activity along with the  LEFT transverse process at L2. Posterior elements at L2 with spinous process involvement, also spinous process involvement at L5. Lytic lesion  in the sacrum on the RIGHT at the S1 level (image 149 of series 4 2.3 cm (SUVmax = 12.3) Multifocal areas of sacral involvement, RIGHT-sided sacral lesion involves the sacral ala. There is scattered areas in the LEFT sacral ala that are occult on CT or nearly occult, near the S2 neural foramen for instance is an approximately 1 cm lesion with a maximum SUV of 13.9 (image 158 of series 4) Intense FDG uptake involving the RIGHT iliac above the acetabulum in the area of abnormality discussed on the previous CT evaluation. Mixed lytic and sclerotic change of the RIGHT pubic root is similar to the prior study and risk for pathologic fracture based on location with intense metabolic activity extending to the medial wall of the acetabulum on image 175 of series 3 (SUVmax = 10.9) similar activity in the LEFT acetabulum with a frankly destructive lesion at the LEFT pubic root on image 176 of series 4 measuring approximately 1.9 cm. Also with metastatic focus in the RIGHT femoral metaphysis that is occult on CT. Similarly LEFT femoral metaphyseal and trochanteric lesions are occult on CT. LEFT iliac with 3 discrete areas of increased metabolic uptake that show faint CT correlate. Incidental CT findings: RIGHT sixth rib region select region lesion is associated with pathologic fracture which is subacute. Spinal degenerative changes. IMPRESSION: 1. Findings of LEFT large pulmonary mass with innumerable pulmonary nodules and widespread bony metastatic disease. 2. LEFT adrenal uptake suspicious for early adrenal metastases. 3. Calvarial and brain metastases better demonstrated on recent MRI. 4. LEFT thyroid bed uptake may represent a small lymph node adjacent to the thyroid or discrete thyroid lesion. Thyroid ultrasound is suggested for further assessment to determine whether second primary is possible with biopsy as warranted. 5. Bilateral pelvic metastases particularly at the LEFT and RIGHT pubic root and RIGHT acetabulum are  risk for pathologic fracture. These results will be called to the ordering clinician or representative by the Radiologist Assistant, and communication documented in the PACS or [employer]. Electronically Signed   By: Zetta Bills M.D.   On: 01/25/2020 09:41   DG CHEST PORT 1 VIEW  Result Date: 01/12/2020 CLINICAL DATA:  Status post bronchoscopy and biopsy today. EXAM: PORTABLE CHEST 1 VIEW COMPARISON:  CT chest, abdomen and pelvis 01/10/2020. FINDINGS: There is no pneumothorax after bronchoscopy. Innumerable bilateral pulmonary nodules and a left lower lobe mass are again seen. Heart size is normal. No pleural fluid. IMPRESSION: Negative for pneumothorax after bronchoscopy. Left lower lobe mass and innumerable pulmonary nodules as seen on prior CT. Electronically Signed   By: Inge Rise M.D.   On: 01/12/2020 15:03   EEG adult  Result Date: 01/12/2020 Lora Havens, MD     01/12/2020  1:46 PM Patient Name: Latorsha Curling MRN: [id number] Epilepsy Attending: Lora Havens Referring Physician/Provider: Dr. Eleonore Chiquito Date: 01/11/2020 Duration: 23.03 minutes Patient history: 74 year old female with transient right facial numbness.  EEG to evaluate for seizures. Level of alertness: Awake AEDs during EEG study: [place] Technical aspects: This EEG study was done with scalp electrodes positioned according to the 10-20 International system of electrode placement. Electrical activity was acquired at a sampling rate of $Remov'500Hz'NsgNhz$  and reviewed with a high frequency filter of $RemoveB'70Hz'qjYKJNOJ$  and a low frequency filter of $RemoveB'1Hz'FnVtldLz$ . EEG data were recorded continuously and digitally stored. Description: The posterior dominant rhythm consists of 10  Hz activity of moderate voltage (25-35 uV) seen predominantly in posterior head regions, symmetric and reactive to eye opening and eye closing. Hyperventilation and photic stimulation were not performed.   IMPRESSION: This study is within normal limits. No seizures or epileptiform  discharges were seen throughout the recording. Lora Havens   ECHOCARDIOGRAM COMPLETE  Result Date: 01/09/2020    ECHOCARDIOGRAM REPORT   Patient Name:   KRISTEN FROMM Date of Exam: 01/09/2020 Medical Rec #:  [id number]     Height:       66.0 in Accession #:    [id number]    Weight:       193.6 lb Date of Birth:  01/19/1946     BSA:          1.972 m Patient Age:    32 years      BP:           137/66 mmHg Patient Gender: F             HR:           59 bpm. Exam Location:  Inpatient Procedure: 2D Echo, Cardiac Doppler and Color Doppler Indications:    Stroke  History:        Patient has prior history of Echocardiogram examinations, most                 recent 07/07/2019. Arrythmias:LBBB; Risk Factors:Hypertension,                 Dyslipidemia and Sleep Apnea. Non-ischemic cardiomyopathy.  Sonographer:    Clayton Lefort RDCS (AE) Referring Phys: Edd[place]  1. Left ventricular ejection fraction, by estimation, is 55 to 60%. The left ventricle has normal function. The left ventricle has no regional wall motion abnormalities. There is mild left ventricular hypertrophy. Left ventricular diastolic parameters are consistent with Grade I diastolic dysfunction (impaired relaxation).  2. Right ventricular systolic function is normal. The right ventricular size is normal.  3. The mitral valve is grossly normal. Trivial mitral valve regurgitation.  4. The aortic valve is grossly normal. Aortic valve regurgitation is not visualized. No aortic stenosis is present. FINDINGS  Left Ventricle: Left ventricular ejection fraction, by estimation, is 55 to 60%. The left ventricle has normal function. The left ventricle has no regional wall motion abnormalities. The left ventricular internal cavity size was normal in size. There is  mild left ventricular hypertrophy. Left ventricular diastolic parameters are consistent with Grade I diastolic dysfunction (impaired relaxation). Right Ventricle: The right ventricular  size is normal. No increase in right ventricular wall thickness. Right ventricular systolic function is normal. Left Atrium: Left atrial size was normal in size. Right Atrium: Right atrial size was normal in size. Pericardium: There is no evidence of pericardial effusion. Mitral Valve: The mitral valve is grossly normal. Trivial mitral valve regurgitation. MV peak gradient, 2.9 mmHg. The mean mitral valve gradient is 1.0 mmHg. Tricuspid Valve: The tricuspid valve is grossly normal. Tricuspid valve regurgitation is not demonstrated. Aortic Valve: The aortic valve is grossly normal. Aortic valve regurgitation is not visualized. No aortic stenosis is present. Aortic valve mean gradient measures 3.0 mmHg. Aortic valve peak gradient measures 6.2 mmHg. Aortic valve area, by VTI measures 2.10 cm. Pulmonic Valve: The pulmonic valve was grossly normal. Pulmonic valve regurgitation is trivial. Aorta: The aortic root and ascending aorta are structurally normal, with no evidence of dilitation. IAS/Shunts: The atrial septum is grossly normal.  LEFT VENTRICLE PLAX 2D LVIDd:  4.10 cm  Diastology LVIDs:         3.00 cm  LV e' lateral:   8.05 cm/s LV PW:         1.20 cm  LV E/e' lateral: 6.2 LV IVS:        1.20 cm  LV e' medial:    6.31 cm/s LVOT diam:     2.00 cm  LV E/e' medial:  7.9 LV SV:         50 LV SV Index:   25 LVOT Area:     3.14 cm  RIGHT VENTRICLE             IVC RV Basal diam:  3.00 cm     IVC diam: 0.90 cm RV S prime:     14.10 cm/s TAPSE (M-mode): 2.7 cm LEFT ATRIUM             Index       RIGHT ATRIUM           Index LA diam:        2.60 cm 1.32 cm/m  RA Area:     11.40 cm LA Vol (A2C):   35.9 ml 18.20 ml/m RA Volume:   21.90 ml  11.10 ml/m LA Vol (A4C):   32.8 ml 16.63 ml/m LA Biplane Vol: 36.6 ml 18.56 ml/m  AORTIC VALVE AV Area (Vmax):    1.98 cm AV Area (Vmean):   2.04 cm AV Area (VTI):     2.10 cm AV Vmax:           125.00 cm/s AV Vmean:          86.100 cm/s AV VTI:            0.238 m AV Peak  Grad:      6.2 mmHg AV Mean Grad:      3.0 mmHg LVOT Vmax:         78.90 cm/s LVOT Vmean:        56.000 cm/s LVOT VTI:          0.159 m LVOT/AV VTI ratio: 0.67  AORTA Ao Root diam: 3.20 cm Ao Asc diam:  3.30 cm MITRAL VALVE MV Area (PHT): 2.45 cm    SHUNTS MV Peak grad:  2.9 mmHg    Systemic VTI:  0.16 m MV Mean grad:  1.0 mmHg    Systemic Diam: 2.00 cm MV Vmax:       0.85 m/s MV Vmean:      48.3 cm/s MV Decel Time: 310 msec MV E velocity: 50.10 cm/s MV A velocity: 74.10 cm/s MV E/A ratio:  0.68 Mertie Moores MD Electronically signed by Mertie Moores MD Signature Date/Time: 01/09/2020/1:34:44 PM    Final    VAS [place] LOWER EXTREMITY VENOUS (DVT)  Result Date: 01/10/2020  Lower Venous DVTStudy Indications: Embolic stroke.  Comparison Study: No prior studies. Performing Technologist: Darlin Coco  Examination Guidelines: A complete evaluation includes B-mode imaging, spectral Doppler, color Doppler, and power Doppler as needed of all accessible portions of each vessel. Bilateral testing is considered an integral part of a complete examination. Limited examinations for reoccurring indications may be performed as noted. The reflux portion of the exam is performed with the patient in reverse Trendelenburg.  +---------+---------------+---------+-----------+----------+--------------+ RIGHT    CompressibilityPhasicitySpontaneityPropertiesThrombus Aging +---------+---------------+---------+-----------+----------+--------------+ CFV      Full           Yes      Yes                                 +---------+---------------+---------+-----------+----------+--------------+  SFJ      Full                                                        +---------+---------------+---------+-----------+----------+--------------+ FV Prox  Full                                                        +---------+---------------+---------+-----------+----------+--------------+ FV Mid   Full                                                         +---------+---------------+---------+-----------+----------+--------------+ FV DistalFull                                                        +---------+---------------+---------+-----------+----------+--------------+ PFV      Full                                                        +---------+---------------+---------+-----------+----------+--------------+ POP      Full           Yes      Yes                                 +---------+---------------+---------+-----------+----------+--------------+ PTV      Full                                                        +---------+---------------+---------+-----------+----------+--------------+ PERO     Full                                                        +---------+---------------+---------+-----------+----------+--------------+   +---------+---------------+---------+-----------+----------+--------------+ LEFT     CompressibilityPhasicitySpontaneityPropertiesThrombus Aging +---------+---------------+---------+-----------+----------+--------------+ CFV      Full           Yes      Yes                                 +---------+---------------+---------+-----------+----------+--------------+ SFJ      Full                                                        +---------+---------------+---------+-----------+----------+--------------+   FV Prox  Full                                                        +---------+---------------+---------+-----------+----------+--------------+ FV Mid   Full                                                        +---------+---------------+---------+-----------+----------+--------------+ FV DistalFull                                                        +---------+---------------+---------+-----------+----------+--------------+ PFV      Full                                                         +---------+---------------+---------+-----------+----------+--------------+ POP      Full           Yes      Yes                                 +---------+---------------+---------+-----------+----------+--------------+ PTV      Full                                                        +---------+---------------+---------+-----------+----------+--------------+ PERO     Full                                                        +---------+---------------+---------+-----------+----------+--------------+     Summary: RIGHT: - There is no evidence of deep vein thrombosis in the lower extremity.  - No cystic structure found in the popliteal fossa.  LEFT: - There is no evidence of deep vein thrombosis in the lower extremity.  - No cystic structure found in the popliteal fossa.  *See table(s) above for measurements and observations. Electronically signed by Ruta Hinds MD on 01/10/2020 at 10:04:29 AM.    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 The patient is positive for an EGFR mutation.  The patient is currently undergoing whole brain radiation as well a palliative radiotherapy to the osseous metastases and primary lung  lesion.  Under the care of Dr. Lisbeth Renshaw.  Her last treatment is expected on 02/02/2020.  The patient is currently undergoin oral chemotherapy with Tagrisso 80 mg p.o. daily.  Her first dose is expected to start today.   The patient was seen with Dr. Julien Nordmann. Labs were reviewed.  Dr. Julien Nordmann recommends she continue on the same treatment at the same dose. She will meet with a member of the  pharmacy team to pick up her prescriptions today.   We will see her back for follow-up visit in 2 weeks for evaluation and repeat blood work.  I will arrange for the patient to have the covid 19 booster vaccine.   We will send a tapering dose of decadron to her pharmacy in case her vertigo, nausea, and blurry vision is related to her leptomeningeal disease. I have sent in a prescription for 4 mg tablets of decadron to be taken BID for 1 week, followed by 1 tablet daily for 1 week, followed by 1/2 a tablet for 1 week. Dr. Julien Nordmann discussed this may exacerbate her GERD and to take her PPI as prescribed.   The patient was advised to call immediately if she has any concerning symptoms in the interval. The patient voices understanding of current disease status and treatment options and is in agreement with the current care plan. All questions were answered. The patient knows to call the clinic with any problems, questions or concerns. We can certainly see the patient much sooner if necessary  Orders Placed This Encounter  Procedures  . CBC with Differential (Cancer Center Only)    Standing Status:   Future    Standing Expiration Date:   01/31/2021  . CMP ([place] only)    Standing Status:   Future    Standing Expiration Date:   01/31/2021     Tobe Sos Saray Capasso, PA-C 02/01/20  ADDENDUM: Hematology/Oncology Attending: I had a face-to-face encounter with the patient today.  I recommended her care plan.  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  This was diagnosed in September 2021. The patient was  supposed to start treatment with Tagrisso 160 mg p.o. daily last week but unfortunately because of high copayment she has to undergo manufacturer assistant which is taken some time. She is currently undergoing palliative radiotherapy to the metastatic brain lesion under the care of Dr. Lisbeth Renshaw and expected to finish her treatment in 2 days. We will try to start the patient on a sample available by our oncologic pharmacist for [place] today. For the intermittent nausea and dizzy spells, we will start the patient on a tapered dose of Decadron starting at 4 mg p.o. twice daily to be tapered in the next 2 weeks. The patient will come back for follow-up visit in 2 weeks for evaluation and repeat blood work. She was advised to call immediately if she has any concerning symptoms in the interval.  Disclaimer: This note was dictated with voice recognition software. Similar sounding words can inadvertently be transcribed and may be missed upon review. Eilleen Kempf, MD 02/01/20

## 2020-02-01 ENCOUNTER — Encounter: Payer: Self-pay | Admitting: Physician Assistant

## 2020-02-01 ENCOUNTER — Inpatient Hospital Stay: Payer: Medicare HMO

## 2020-02-01 ENCOUNTER — Telehealth: Payer: Self-pay | Admitting: Internal Medicine

## 2020-02-01 ENCOUNTER — Inpatient Hospital Stay: Payer: Medicare HMO | Admitting: Physician Assistant

## 2020-02-01 ENCOUNTER — Ambulatory Visit
Admission: RE | Admit: 2020-02-01 | Discharge: 2020-02-01 | Disposition: A | Discharge status: Home / Self Care | Payer: Medicare HMO | Source: Ambulatory Visit | Attending: Radiation Oncology | Admitting: Radiation Oncology

## 2020-02-01 ENCOUNTER — Other Ambulatory Visit: Payer: Self-pay

## 2020-02-01 VITALS — BP 100/64 | HR 61 | Temp 97.0°F | Resp 18 | Ht 66.0 in | Wt 190.8 lb

## 2020-02-01 DIAGNOSIS — C7931 Secondary malignant neoplasm of brain: Secondary | ICD-10-CM | POA: Diagnosis not present

## 2020-02-01 DIAGNOSIS — C7951 Secondary malignant neoplasm of bone: Secondary | ICD-10-CM

## 2020-02-01 DIAGNOSIS — C3432 Malignant neoplasm of lower lobe, left bronchus or lung: Secondary | ICD-10-CM | POA: Diagnosis not present

## 2020-02-01 DIAGNOSIS — R42 Dizziness and giddiness: Secondary | ICD-10-CM

## 2020-02-01 DIAGNOSIS — C3492 Malignant neoplasm of unspecified part of left bronchus or lung: Secondary | ICD-10-CM | POA: Diagnosis not present

## 2020-02-01 DIAGNOSIS — Z510 Encounter for antineoplastic radiation therapy: Secondary | ICD-10-CM | POA: Diagnosis not present

## 2020-02-01 DIAGNOSIS — C3490 Malignant neoplasm of unspecified part of unspecified bronchus or lung: Secondary | ICD-10-CM

## 2020-02-01 DIAGNOSIS — C7949 Secondary malignant neoplasm of other parts of nervous system: Secondary | ICD-10-CM | POA: Diagnosis not present

## 2020-02-01 LAB — CBC WITH DIFFERENTIAL (CANCER CENTER ONLY)
Abs Immature Granulocytes: 0.02 10*3/uL (ref 0.00–0.07)
Basophils Absolute: 0 10*3/uL (ref 0.0–0.1)
Basophils Relative: 0 %
Eosinophils Absolute: 0.2 10*3/uL (ref 0.0–0.5)
Eosinophils Relative: 4 %
HCT: 42.6 % (ref 36.0–46.0)
Hemoglobin: 13.8 g/dL (ref 12.0–15.0)
Immature Granulocytes: 0 %
Lymphocytes Relative: 9 %
Lymphs Abs: 0.4 10*3/uL — ABNORMAL LOW (ref 0.7–4.0)
MCH: 28.3 pg (ref 26.0–34.0)
MCHC: 32.4 g/dL (ref 30.0–36.0)
MCV: 87.5 fL (ref 80.0–100.0)
Monocytes Absolute: 0.3 10*3/uL (ref 0.1–1.0)
Monocytes Relative: 7 %
Neutro Abs: 3.7 10*3/uL (ref 1.7–7.7)
Neutrophils Relative %: 80 %
Platelet Count: 203 10*3/uL (ref 150–400)
RBC: 4.87 MIL/uL (ref 3.87–5.11)
RDW: 13.6 % (ref 11.5–15.5)
WBC Count: 4.7 10*3/uL (ref 4.0–10.5)
nRBC: 0 % (ref 0.0–0.2)

## 2020-02-01 LAB — CMP (CANCER CENTER ONLY)
ALT: 14 U/L (ref 0–44)
AST: 13 U/L — ABNORMAL LOW (ref 15–41)
Albumin: 3.5 g/dL (ref 3.5–5.0)
Alkaline Phosphatase: 286 U/L — ABNORMAL HIGH (ref 38–126)
Anion gap: 3 — ABNORMAL LOW (ref 5–15)
BUN: 17 mg/dL (ref 8–23)
CO2: 33 mmol/L — ABNORMAL HIGH (ref 22–32)
Calcium: 10.1 mg/dL (ref 8.9–10.3)
Chloride: 103 mmol/L (ref 98–111)
Creatinine: 0.95 mg/dL (ref 0.44–1.00)
GFR, Est AFR Am: >60 mL/min (ref >60)
GFR, Estimated: 60 mL/min — ABNORMAL LOW (ref >60)
Glucose, Bld: 93 mg/dL (ref 70–99)
Potassium: 4.6 mmol/L (ref 3.5–5.1)
Sodium: 139 mmol/L (ref 135–145)
Total Bilirubin: 0.5 mg/dL (ref 0.3–1.2)
Total Protein: 7.3 g/dL (ref 6.5–8.1)

## 2020-02-01 MED ORDER — DEXAMETHASONE 4 MG PO TABS: ORAL_TABLET | ORAL | 0 refills | Status: DC

## 2020-02-01 NOTE — Telephone Encounter (Signed)
Scheduled per 9/24 los. Pt is aware of appt times and date.

## 2020-02-01 NOTE — Telephone Encounter (Signed)
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   Counseled patient on administration, dosing, side effects, monitoring, drug-food interactions, safe handling, storage, and disposal.  Patient will take Tagrisso 80 tablets, 2 tablets by mouth once daily, without regard to food.  Tagrisso start date: 02/02/20  Adverse effects include but are not limited to: diarrhea, mouth sores, decreased appetitie, fatigue, dry skin, rash, nail changes, altered cardiac conduction, and decreased blood counts or electrolytes.   Patient will obtain anti diarrheal and alert the office of 4 or more loose stools above baseline.   Reviewed with patient importance of keeping a medication schedule and plan for any missed doses. No barriers to medication adherence identified.  Medication reconciliation performed and medication/allergy list updated.  Patient has been approved for manufacturer assistance for Tagrisso through 05/06/2020. Patient notified that [employer] should be reaching out to patient to set up shipment of medication through [facility].   All questions answered.  Ms. Rae voiced understanding and appreciation.   Medication education handout given to patient. Patient knows to call the office with questions or concerns. Oral Chemotherapy Clinic phone number provided to patient.   Leron Croak, PharmD, BCPS Hematology/Oncology Clinical Pharmacist [facility] [phone] 02/01/2020 4:09 PM

## 2020-02-01 NOTE — Telephone Encounter (Signed)
Patient is approved for Tagrisso at no charge 02/01/20-05/06/20.

## 2020-02-02 ENCOUNTER — Encounter: Payer: Self-pay | Admitting: Radiation Oncology

## 2020-02-02 ENCOUNTER — Other Ambulatory Visit: Payer: Self-pay

## 2020-02-02 ENCOUNTER — Ambulatory Visit
Admission: RE | Admit: 2020-02-02 | Discharge: 2020-02-02 | Disposition: A | Discharge status: Home / Self Care | Payer: Medicare HMO | Source: Ambulatory Visit | Attending: Radiation Oncology | Admitting: Radiation Oncology

## 2020-02-02 ENCOUNTER — Other Ambulatory Visit: Payer: Self-pay | Admitting: Radiation Therapy

## 2020-02-02 DIAGNOSIS — C3432 Malignant neoplasm of lower lobe, left bronchus or lung: Secondary | ICD-10-CM | POA: Diagnosis not present

## 2020-02-02 DIAGNOSIS — C7931 Secondary malignant neoplasm of brain: Secondary | ICD-10-CM | POA: Diagnosis not present

## 2020-02-02 DIAGNOSIS — Z510 Encounter for antineoplastic radiation therapy: Secondary | ICD-10-CM | POA: Diagnosis not present

## 2020-02-02 DIAGNOSIS — C7951 Secondary malignant neoplasm of bone: Secondary | ICD-10-CM | POA: Diagnosis not present

## 2020-02-02 LAB — GUARDANT 360

## 2020-02-03 ENCOUNTER — Other Ambulatory Visit: Payer: Self-pay | Admitting: Physician Assistant

## 2020-02-03 ENCOUNTER — Telehealth: Payer: Self-pay | Admitting: Medical Oncology

## 2020-02-03 DIAGNOSIS — C3492 Malignant neoplasm of unspecified part of left bronchus or lung: Secondary | ICD-10-CM

## 2020-02-03 MED ORDER — OXYCODONE-ACETAMINOPHEN 5-325 MG PO TABS: 1.0000 | ORAL_TABLET | Freq: Three times a day (TID) | ORAL | 0 refills | Status: DC | PRN

## 2020-02-03 NOTE — Telephone Encounter (Signed)
Refill percocet

## 2020-02-04 ENCOUNTER — Telehealth: Payer: Self-pay | Admitting: Physician Assistant

## 2020-02-04 NOTE — Telephone Encounter (Signed)
Returned pt call. I told her on the message that Cassie refilled her percocet yesterday.

## 2020-02-04 NOTE — Telephone Encounter (Signed)
Scheduled per los. Called and spoke with patient. Confirmed appt 

## 2020-02-05 ENCOUNTER — Encounter (HOSPITAL_COMMUNITY): Payer: Self-pay | Admitting: Internal Medicine

## 2020-02-09 NOTE — Progress Notes (Signed)
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  Molecular Biomarkers: Positive for EGFR mutation  PRIOR THERAPY: Whole brain radiation and radiotherapy to the osseous metastases under the care of Dr. Lisbeth Renshaw. Last treatment expected on 02/02/20  CURRENT THERAPY: Tagrisso 160 mg p.o. daily. First dose on 02/01/20.   INTERVAL HISTORY: Stefanie Camacho 74 y.o. female returns to clinic today for a follow-up visit.  The patient was recently diagnosed with stage IV adenocarcinoma.  She is positive for an EGFR mutation and started on systemic oral chemotherapy with Tagrisso.  The patient has been taking this for approximately 1 week and she is tolerating it well without any adverse side effects.  Of note, the patient also follows with her orthopedic surgeon, Dr. 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  She is currently ambulating with a walker to reduce the risk of fracture and has a follow-up appointment with him later this month.  The patient is also followed by radiation oncology and completed whole brain radiation as well as palliative radiotherapy to the primary lung lesion.  She was given a tapering dose of Decadron due to complaints of blurry vision and vertigo at her last appointment; however, she denies symptoms at this time and believes her symptoms last week were related to her history of vertigo.  The patient is also followed by neuro oncologist, Dr. Mickeal Skinner for her leptomeningeal disease.  She is on Keppra for an antielliptic.  Otherwise, the patient is feeling well.  She denies any  fever or chills.  She denies any signs and symptoms of infection including sore throat nasal congestion, skin infections, or dysuria.  She denies any hemoptysis or chest pain.  She does report some chest pressure which she believes is related to her tumor. She denies diaphoresis, shortness of breath, or exertional discomfort. She feels it when laying in certain positions such as on her side. Her appetite is fair. She lost about 2 lbs since her last appointment.  She denies any shortness of breath.  She denies any nausea, vomiting, diarrhea, or constipation. She has chronic urinary incontinence and has a history of bladder neck reconstruction and is inquiring if there are medications for this. Her hip pain is improving and now describes it as a "discomfort". She does not believe she needs codeine anymore for pain and is going to start taking tylenol if needed. She denies any rashes or skin changes.  The patient is here today for evaluation and repeat blood work.  MEDICAL HISTORY: Past Medical History:  Diagnosis Date  . Anxiety   . Dyslipidemia   . Essential hypertension   . GERD (gastroesophageal reflux disease)   . Hypothyroidism   . Left bundle branch block 06/04/2018  . NICM (nonischemic cardiomyopathy) ([facility])   . Nonischemic cardiomyopathy ([facility]) 06/04/2018   Ejection fraction 45% in summer 2019  . Sleep apnea     ALLERGIES:  has No Known Allergies.  MEDICATIONS:  Current Outpatient Medications  Medication Sig Dispense Refill  . aspirin EC 81 MG tablet Take 81 mg by mouth daily.    Marland Kitchen atorvastatin (LIPITOR) 10 MG tablet Take 1  tablet (10 mg total) by mouth daily at 6 PM. 30 tablet 2  . dexamethasone (DECADRON) 4 MG tablet Please take 1 tablet twice a day for 1 week, followed by 1 tablet per day for 1 week, followed by 1/2 a tablet for 1 week 26 tablet 0  . FLUoxetine (PROZAC) 10 MG tablet Take 1 tablet (10 mg total) by mouth daily. 30 tablet 3  . levETIRAcetam (KEPPRA) 500 MG tablet Take 1  tablet (500 mg total) by mouth 2 (two) times daily. 60 tablet 1  . levothyroxine (SYNTHROID) 75 MCG tablet Take 1 tablet (75 mcg total) by mouth daily. 30 tablet 1  . losartan (COZAAR) 50 MG tablet Take 1 tablet (50 mg total) by mouth daily. 90 tablet 2  . metoprolol succinate (TOPROL-XL) 50 MG 24 hr tablet Take 1 tablet (50 mg total) by mouth daily. Take with or immediately following a meal. 90 tablet 2  . osimertinib mesylate (TAGRISSO) 80 MG tablet Take 2 tablets (160 mg total) by mouth daily. Days 1-28. (Patient not taking: Reported on 02/01/2020) 60 tablet 3  . oxyCODONE-acetaminophen (PERCOCET/ROXICET) 5-325 MG tablet Take 1 tablet by mouth every 8 (eight) hours as needed for severe pain. 45 tablet 0  . pantoprazole (PROTONIX) 40 MG tablet Take 1 tablet (40 mg total) by mouth daily. 30 tablet 1  . prochlorperazine (COMPAZINE) 10 MG tablet Take 1 tablet (10 mg total) by mouth every 6 (six) hours as needed for nausea or vomiting. 45 tablet 5  . spironolactone (ALDACTONE) 25 MG tablet Take 0.5 tablets (12.5 mg total) by mouth daily. 45 tablet 3   No current facility-administered medications for this visit.    SURGICAL HISTORY:  Past Surgical History:  Procedure Laterality Date  . ABDOMINAL HYSTERECTOMY    . BLADDER NECK RECONSTRUCTION    . BRONCHIAL BIOPSY  01/12/2020   Procedure: BRONCHIAL BIOPSIES;  Surgeon: Garner Nash, DO;  Location: [facility];  Service: Pulmonary;;  . BRONCHIAL BRUSHINGS  01/12/2020   Procedure: BRONCHIAL BRUSHINGS;  Surgeon: Garner Nash, DO;  Location: [facility];  Service: Pulmonary;;  . BRONCHIAL NEEDLE ASPIRATION BIOPSY  01/12/2020   Procedure: BRONCHIAL NEEDLE ASPIRATION BIOPSIES;  Surgeon: Garner Nash, DO;  Location: [facility];  Service: Pulmonary;;  . BRONCHIAL WASHINGS  01/12/2020   Procedure: BRONCHIAL WASHINGS;  Surgeon: Garner Nash, DO;  Location: [facility];  Service: Pulmonary;;  . SHOULDER SURGERY    . TONSILLECTOMY    . VIDEO  BRONCHOSCOPY WITH ENDOBRONCHIAL ULTRASOUND  01/12/2020   Procedure: VIDEO BRONCHOSCOPY WITH ENDOBRONCHIAL ULTRASOUND;  Surgeon: Garner Nash, DO;  Location: [facility];  Service: Pulmonary;;    REVIEW OF SYSTEMS:   Review of Systems  Constitutional: Positive for fatigue and weight loss. Negative for appetite change, chills, and fever.   HENT: Negative for mouth sores, nosebleeds, sore throat and trouble swallowing.   Eyes: Negative for eye problems and icterus.  Respiratory: Negative for cough, hemoptysis, shortness of breath and wheezing.   Cardiovascular: Positive for positional chest pressure. Negative for chest pain and leg swelling.  Gastrointestinal: Negative for abdominal pain, constipation, diarrhea, nausea and vomiting.  Genitourinary: Negative for bladder incontinence, difficulty urinating, dysuria, frequency and hematuria.   Musculoskeletal: Positive for hip pain. (improved). Negative for back pain, gait problem, neck pain and neck stiffness.  Skin: Negative for itching and rash.  Neurological: Negative for dizziness, extremity weakness, gait problem, headaches, light-headedness and seizures.  Hematological: Negative for adenopathy. Does not bruise/bleed easily.  Psychiatric/Behavioral: Negative for confusion, depression and sleep disturbance. The patient is not nervous/anxious.     PHYSICAL EXAMINATION:  There were no vitals taken for this visit.  ECOG PERFORMANCE STATUS: 2 - Symptomatic, <50% confined to bed  Physical Exam  Constitutional: Oriented to person, place, and time and well-developed, well-nourished, and in no distress.  HENT:  Head: Normocephalic and atraumatic.  Mouth/Throat: Oropharynx is clear and moist. No oropharyngeal exudate.  Eyes: Conjunctivae are normal. Right eye exhibits no discharge. Left eye exhibits no discharge. No scleral icterus.  Neck: Normal range of motion. Neck supple.  Cardiovascular: Normal rate, regular rhythm, normal heart sounds  and intact distal pulses.   Pulmonary/Chest: Effort normal and breath sounds normal. No respiratory distress. No wheezes. No rales.  Abdominal: Soft. Bowel sounds are normal. Exhibits no distension and no mass. There is no tenderness.  Musculoskeletal: Normal range of motion. Exhibits no edema.  Lymphadenopathy:    No cervical adenopathy.  Neurological: Alert and oriented to person, place, and time. Exhibits normal muscle tone. Gait normal. Coordination normal.  Skin: Skin is warm and dry. No rash noted. Not diaphoretic. No erythema. No pallor.  Psychiatric: Mood, memory and judgment normal.  Vitals reviewed.  LABORATORY DATA: Lab Results  Component Value Date   WBC 4.7 02/01/2020   HGB 13.8 02/01/2020   HCT 42.6 02/01/2020   MCV 87.5 02/01/2020   PLT 203 02/01/2020      Chemistry      Component Value Date/Time   NA 139 02/01/2020 1419   K 4.6 02/01/2020 1419   CL 103 02/01/2020 1419   CO2 33 (H) 02/01/2020 1419   BUN 17 02/01/2020 1419   CREATININE 0.95 02/01/2020 1419      Component Value Date/Time   CALCIUM 10.1 02/01/2020 1419   ALKPHOS 286 (H) 02/01/2020 1419   AST 13 (L) 02/01/2020 1419   ALT 14 02/01/2020 1419   BILITOT 0.5 02/01/2020 1419       RADIOGRAPHIC STUDIES:  MR BRAIN W CONTRAST  Result Date: 01/10/2020 CLINICAL DATA:  Non-small cell lung cancer staging. EXAM: MRI HEAD WITH CONTRAST TECHNIQUE: Multiplanar, multiecho pulse sequences of the brain and surrounding structures were obtained with intravenous contrast. CONTRAST:  71m GADAVIST GADOBUTROL 1 MMOL/ML IV SOLN COMPARISON:  Noncontrast head MRI 01/09/2020 FINDINGS: There are numerous enhancing lesions in both cerebral hemispheres with the largest measuring 1.4 cm in the left temporal lobe (series 5, image 19). There is minimal edema associated with this lesion as well as with a slightly smaller lesion more posteriorly at the left temporo-occipital junction. There are also multiple areas of abnormal  leptomeningeal enhancement bilaterally, greatest in the left occipital region. No enhancing lesions are identified in the posterior fossa. An enhancing right frontoparietal skull lesion measures 1.6 cm (series 5, image 45). IMPRESSION: 1. 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 2. 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 Electronically Signed   By: ALogan BoresM.D.   On: 01/10/2020 16:35   NM PET Image Initial (PI) Skull Base To Thigh  Result Date: 01/25/2020 CLINICAL DATA:  Initial treatment strategy for non-small cell lung cancer. EXAM: NUCLEAR MEDICINE PET SKULL BASE TO THIGH TECHNIQUE: 10.1 mCi F-18 FDG was injected intravenously. Full-ring PET imaging was performed from the skull base to thigh after the radiotracer. CT data was obtained and used for attenuation correction and anatomic localization. Fasting blood glucose: 92 mg/dl COMPARISON:  Chest CT from January 10, 2020 FINDINGS: Mediastinal blood pool  activity: SUV max 3.49 Liver activity: SUV max 4.44 NECK: Calvarial and brain metastases better demonstrated on recent MRI, not well seen on PET evaluation. No hypermetabolic lymph nodes in the neck. Incidental CT findings: None CHEST: Innumerable pulmonary nodules all with increased FDG uptake to varying degrees. LEFT lower lobe pulmonary mass seen on previous imaging grossly stable in the interval, as measured on the previous exam and on the current exam at approximately 4.8 x 4.2 cm. Partially obscured by atelectasis on the current study with intense metabolic activity (SUVmax = 13.3) RIGHT upper lobe pulmonary nodule 11 mm (image 78, series 4) (SUVmax = 8.2) Numerous additional pulmonary nodules, too numerous to count in the bilateral chest. Not associated with pleural effusion aside from a small amount of fluid adjacent to the dominant mass in the LEFT lower lobe all showing increased FDG uptake as discussed. No adenopathy by size  criteria. Mildly enlarged prevascular lymph node on image 66 of series 4 shows a maximum SUV of 3.85. Enlarged pre pericardial lymph node with some calcification measures 10 mm short axis with a maximum SUV of 3.9 Intensely FDG avid area in LEFT thyroid bed corresponds to an area of low attenuation contiguous with the LEFT thyroid lobe (image 47, series 4) maximum SUV of 5.8 Incidental CT findings: Calcified atheromatous plaque in the thoracic aorta. No aneurysmal dilation. No pericardial effusion. Esophagus grossly normal. ABDOMEN/PELVIS: Subtle nodule associated with the inferior LEFT adrenal gland (SUVmax = 6.1 (image 114 of series 4) 7 mm. RIGHT adrenal gland without discrete mass, uptake difficult to separate from adjacent liver tiny lymph node posterior to the aorta on image 133 of series 4 with an SUV of 4.6) Incidental CT findings: Low-density area in the liver corresponding to hemangioma seen on prior imaging. No acute gastrointestinal process. Calcified atheromatous plaque of the abdominal aorta, mild and without aneurysmal dilation. No ascites. Post hysterectomy. SKELETON: Multifocal areas of bony metastatic disease with marked increased FDG uptake, some areas seen on previous imaging and others occult on CT. T5 RIGHT transverse process on image 67 of series 4 is an example of a lucent lesion that is visible on CT with increased metabolic activity (SUVmax = 12.2) Another area in the spine that is occult on CT shows increased FDG uptake at the T6 vertebral level (image 81 of series 4, only subtle changes can be appreciated in hindsight in this area) (SUVmax = 8.7 Faint uptake in the C3 vertebral body raising the question of metastatic disease in this location with similar uptake also in the T3 vertebral body, activity at C3 is 5.3) Areas of uptake within the RIGHT humerus in the humeral shaft without corresponding CT abnormality (image 34 of series 4 RIGHT-sided rib uptake affecting RIGHT sixth rib most  notably on image 89 of series 4 with a maximum SUV of 9.7 subtle changes elsewhere in the ribs bilaterally compatible with multifocal involvement. L1 lucent lesion involving posterior vertebral body measuring 11 mm (image 111 of series 4) (SUVmax = 11.3. This violates posterior cortex. Similar small area of lucency and L2 also showing increased metabolic activity along with the LEFT transverse process at L2. Posterior elements at L2 with spinous process involvement, also spinous process involvement at L5. Lytic lesion in the sacrum on the RIGHT at the S1 level (image 149 of series 4 2.3 cm (SUVmax = 12.3) Multifocal areas of sacral involvement, RIGHT-sided sacral lesion involves the sacral ala. There is scattered areas in the LEFT sacral ala that are occult  on CT or nearly occult, near the S2 neural foramen for instance is an approximately 1 cm lesion with a maximum SUV of 13.9 (image 158 of series 4) Intense FDG uptake involving the RIGHT iliac above the acetabulum in the area of abnormality discussed on the previous CT evaluation. Mixed lytic and sclerotic change of the RIGHT pubic root is similar to the prior study and risk for pathologic fracture based on location with intense metabolic activity extending to the medial wall of the acetabulum on image 175 of series 3 (SUVmax = 10.9) similar activity in the LEFT acetabulum with a frankly destructive lesion at the LEFT pubic root on image 176 of series 4 measuring approximately 1.9 cm. Also with metastatic focus in the RIGHT femoral metaphysis that is occult on CT. Similarly LEFT femoral metaphyseal and trochanteric lesions are occult on CT. LEFT iliac with 3 discrete areas of increased metabolic uptake that show faint CT correlate. Incidental CT findings: RIGHT sixth rib region select region lesion is associated with pathologic fracture which is subacute. Spinal degenerative changes. IMPRESSION: 1. Findings of LEFT large pulmonary mass with innumerable pulmonary  nodules and widespread bony metastatic disease. 2. LEFT adrenal uptake suspicious for early adrenal metastases. 3. Calvarial and brain metastases better demonstrated on recent MRI. 4. LEFT thyroid bed uptake may represent a small lymph node adjacent to the thyroid or discrete thyroid lesion. Thyroid ultrasound is suggested for further assessment to determine whether second primary is possible with biopsy as warranted. 5. Bilateral pelvic metastases particularly at the LEFT and RIGHT pubic root and RIGHT acetabulum are risk for pathologic fracture. These results will be called to the ordering clinician or representative by the Radiologist Assistant, and communication documented in the PACS or [employer]. Electronically Signed   By: Zetta Bills M.D.   On: 01/25/2020 09:41   DG CHEST PORT 1 VIEW  Result Date: 01/12/2020 CLINICAL DATA:  Status post bronchoscopy and biopsy today. EXAM: PORTABLE CHEST 1 VIEW COMPARISON:  CT chest, abdomen and pelvis 01/10/2020. FINDINGS: There is no pneumothorax after bronchoscopy. Innumerable bilateral pulmonary nodules and a left lower lobe mass are again seen. Heart size is normal. No pleural fluid. IMPRESSION: Negative for pneumothorax after bronchoscopy. Left lower lobe mass and innumerable pulmonary nodules as seen on prior CT. Electronically Signed   By: Inge Rise M.D.   On: 01/12/2020 15:03   EEG adult  Result Date: 01/12/2020 Lora Havens, MD     01/12/2020  1:46 PM Patient Name: Stefanie Camacho MRN: [id number] Epilepsy Attending: Lora Havens Referring Physician/Provider: Dr. Eleonore Chiquito Date: 01/11/2020 Duration: 23.03 minutes Patient history: 74 year old female with transient right facial numbness.  EEG to evaluate for seizures. Level of alertness: Awake AEDs during EEG study: Keppra Technical aspects: This EEG study was done with scalp electrodes positioned according to the 10-20 International system of electrode placement. Electrical activity was  acquired at a sampling rate of _0  and reviewed with a high frequency filter of _1  and a low frequency filter of _2 . EEG data were recorded continuously and digitally stored. Description: The posterior dominant rhythm consists of 10 Hz activity of moderate voltage (25-35 uV) seen predominantly in posterior head regions, symmetric and reactive to eye opening and eye closing. Hyperventilation and photic stimulation were not performed.   IMPRESSION: This study is within normal limits. No seizures or epileptiform discharges were seen throughout the recording. 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 The patient is positive for an EGFR mutation.  The patient completed whole brain radiation as well a palliative radiotherapy to the osseous metastases and primary lung lesion under the care of Dr. Lisbeth Renshaw.  Her last treatment was on 02/02/2020.  The patient is currently undergoing oral chemotherapy with Tagrisso 160 mg p.o. daily.  Her first dose was on 02/01/20.  The patient was seen with Dr. Julien Nordmann today.  Labs were reviewed. Her labs show some thrombocytopenia with a platelet count of 82k.  Recommend that she continue on the same treatment at the same dose.  We will see her back for follow-up visit in 2 weeks for evaluation and repeat blood work.  She left before she can receive her COVID-19 booster today. I did not realize that she had an appointment for that today. I will go ahead and reschedule it for her next follow up in 2 weeks. I left a voicemail on her and her son's voicemail with this information.    Regarding her chronic incontinence, I encouraged her to follow up with her PCP or urologist.   She will take a tylenol or tylenol PM for pain management of her cancer related pain due to the osseous lesions in her hip.    The patient was advised to call immediately if she has any concerning symptoms in the interval. The patient voices understanding of current disease status and treatment options and is in agreement with the current care plan. All questions were answered. The patient knows to call the clinic with any problems, questions or concerns. We can certainly see the patient much sooner if necessary  No orders of the defined types were placed in this encounter.    Aul Mangieri L Briselda Naval, PA-C 02/09/20  ADDENDUM: Hematology/Oncology Attending: I had a face-to-face encounter with the patient today.  I recommended her care plan.  This is a very pleasant 74 years old white female recently diagnosed with stage IV non-small cell lung cancer, adenocarcinoma with positive EGFR mutation with deletion in exon 19.  The patient also has multiple brain metastasis as well as leptomeningeal disease.  She is status post palliative radiotherapy to the brain and she is currently on treatment with Tagrisso 160 mg p.o. daily because of the leptomeningeal disease. She started her treatment a week ago and tolerating it fairly well with no significant adverse effects. I recommended for the patient to continue her treatment with Tagrisso with the same dose. She will come back for follow-up visit in 2 weeks for evaluation and repeat blood work. She was advised to call immediately if she has any concerning symptoms in the interval.  Disclaimer: This note was dictated with voice recognition software. Similar sounding words can inadvertently be transcribed and may be missed upon review. Eilleen Kempf, MD 02/11/20

## 2020-02-11 ENCOUNTER — Inpatient Hospital Stay: Payer: Medicare HMO | Attending: Internal Medicine | Admitting: Physician Assistant

## 2020-02-11 ENCOUNTER — Other Ambulatory Visit: Payer: Self-pay

## 2020-02-11 ENCOUNTER — Inpatient Hospital Stay: Payer: Medicare HMO

## 2020-02-11 ENCOUNTER — Encounter: Payer: Self-pay | Admitting: Physician Assistant

## 2020-02-11 VITALS — BP 110/71 | HR 59 | Temp 97.7°F | Resp 18 | Ht 66.0 in | Wt 188.7 lb

## 2020-02-11 DIAGNOSIS — E039 Hypothyroidism, unspecified: Secondary | ICD-10-CM | POA: Diagnosis not present

## 2020-02-11 DIAGNOSIS — Z23 Encounter for immunization: Secondary | ICD-10-CM | POA: Insufficient documentation

## 2020-02-11 DIAGNOSIS — G4730 Sleep apnea, unspecified: Secondary | ICD-10-CM | POA: Insufficient documentation

## 2020-02-11 DIAGNOSIS — C7951 Secondary malignant neoplasm of bone: Secondary | ICD-10-CM | POA: Insufficient documentation

## 2020-02-11 DIAGNOSIS — I10 Essential (primary) hypertension: Secondary | ICD-10-CM | POA: Diagnosis not present

## 2020-02-11 DIAGNOSIS — Z9221 Personal history of antineoplastic chemotherapy: Secondary | ICD-10-CM | POA: Diagnosis not present

## 2020-02-11 DIAGNOSIS — C3492 Malignant neoplasm of unspecified part of left bronchus or lung: Secondary | ICD-10-CM

## 2020-02-11 DIAGNOSIS — F419 Anxiety disorder, unspecified: Secondary | ICD-10-CM | POA: Diagnosis not present

## 2020-02-11 DIAGNOSIS — K219 Gastro-esophageal reflux disease without esophagitis: Secondary | ICD-10-CM | POA: Insufficient documentation

## 2020-02-11 DIAGNOSIS — C7931 Secondary malignant neoplasm of brain: Secondary | ICD-10-CM | POA: Diagnosis present

## 2020-02-11 DIAGNOSIS — Z79899 Other long term (current) drug therapy: Secondary | ICD-10-CM | POA: Insufficient documentation

## 2020-02-11 DIAGNOSIS — Z7982 Long term (current) use of aspirin: Secondary | ICD-10-CM | POA: Insufficient documentation

## 2020-02-11 DIAGNOSIS — C3432 Malignant neoplasm of lower lobe, left bronchus or lung: Secondary | ICD-10-CM | POA: Diagnosis not present

## 2020-02-11 DIAGNOSIS — Z923 Personal history of irradiation: Secondary | ICD-10-CM | POA: Insufficient documentation

## 2020-02-11 DIAGNOSIS — R69 Illness, unspecified: Secondary | ICD-10-CM | POA: Diagnosis not present

## 2020-02-11 DIAGNOSIS — Z90710 Acquired absence of both cervix and uterus: Secondary | ICD-10-CM | POA: Diagnosis not present

## 2020-02-11 LAB — CMP (CANCER CENTER ONLY)
ALT: 28 U/L (ref 0–44)
AST: 11 U/L — ABNORMAL LOW (ref 15–41)
Albumin: 3.2 g/dL — ABNORMAL LOW (ref 3.5–5.0)
Alkaline Phosphatase: 257 U/L — ABNORMAL HIGH (ref 38–126)
Anion gap: 4 — ABNORMAL LOW (ref 5–15)
BUN: 30 mg/dL — ABNORMAL HIGH (ref 8–23)
CO2: 26 mmol/L (ref 22–32)
Calcium: 8.9 mg/dL (ref 8.9–10.3)
Chloride: 107 mmol/L (ref 98–111)
Creatinine: 0.92 mg/dL (ref 0.44–1.00)
GFR, Estimated: >60 mL/min (ref >60)
Glucose, Bld: 105 mg/dL — ABNORMAL HIGH (ref 70–99)
Potassium: 3.5 mmol/L (ref 3.5–5.1)
Sodium: 137 mmol/L (ref 135–145)
Total Bilirubin: 0.6 mg/dL (ref 0.3–1.2)
Total Protein: 6.2 g/dL — ABNORMAL LOW (ref 6.5–8.1)

## 2020-02-11 LAB — CBC WITH DIFFERENTIAL (CANCER CENTER ONLY)
Abs Immature Granulocytes: 0.05 10*3/uL (ref 0.00–0.07)
Basophils Absolute: 0 10*3/uL (ref 0.0–0.1)
Basophils Relative: 0 %
Eosinophils Absolute: 0 10*3/uL (ref 0.0–0.5)
Eosinophils Relative: 0 %
HCT: 41.2 % (ref 36.0–46.0)
Hemoglobin: 13.8 g/dL (ref 12.0–15.0)
Immature Granulocytes: 1 %
Lymphocytes Relative: 3 %
Lymphs Abs: 0.3 10*3/uL — ABNORMAL LOW (ref 0.7–4.0)
MCH: 28.5 pg (ref 26.0–34.0)
MCHC: 33.5 g/dL (ref 30.0–36.0)
MCV: 84.9 fL (ref 80.0–100.0)
Monocytes Absolute: 0.7 10*3/uL (ref 0.1–1.0)
Monocytes Relative: 8 %
Neutro Abs: 7.7 10*3/uL (ref 1.7–7.7)
Neutrophils Relative %: 88 %
Platelet Count: 82 10*3/uL — ABNORMAL LOW (ref 150–400)
RBC: 4.85 MIL/uL (ref 3.87–5.11)
RDW: 14.8 % (ref 11.5–15.5)
WBC Count: 8.7 10*3/uL (ref 4.0–10.5)
nRBC: 0 % (ref 0.0–0.2)

## 2020-02-15 ENCOUNTER — Telehealth: Payer: Self-pay | Admitting: *Deleted

## 2020-02-15 ENCOUNTER — Telehealth: Payer: Self-pay | Admitting: Physician Assistant

## 2020-02-15 NOTE — Telephone Encounter (Signed)
Patient called requesting advice on how to deal with pressure sore.  Patient describes Pressure sore possibly stage 2.  Educated on cleaning, repositioning, application of creams such as destin and neosporin and coverage such as duoderm.  Advised to call PCP or [place] if worsens.

## 2020-02-15 NOTE — Telephone Encounter (Signed)
Scheduled per los. Called and spoke with patient. Confirmed appt 

## 2020-02-19 ENCOUNTER — Telehealth: Payer: Self-pay

## 2020-02-19 NOTE — Telephone Encounter (Signed)
Pt LM stating she has had diarrhea for 4 days and wants to know if she can take Imodium.  Confirmed with Dr. Julien Nordmann this is appropriate.  I called pt back and advised as indicated. Pt expressed understanding of this information.

## 2020-02-22 NOTE — Progress Notes (Signed)
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  Molecular Biomarkers:Positive for EGFR mutation  PRIOR THERAPY: Whole brain radiation and radiotherapy to the osseous metastases under the care of Dr. Lisbeth Renshaw. Last treatment expected on 02/02/20  CURRENT THERAPY: Tagrisso 160 mg p.o. daily. First dose on9/27/21.  INTERVAL HISTORY: Stefanie Camacho 74 y.o. female returns to the clinic today for a follow-up visit accompanied by her sister.  The patient was recently diagnosed with stage IV adenocarcinoma.  She has a positive EGFR mutation and is currently on systemic oral chemotherapy with Tagrisso.  The patient has been taking this for approximately 3-4 weeks and she is tolerating it fair.  She reports that she has been having intermittent diarrhea for the last 2 weeks or so.  She had a bout of diarrhea approximately 2 weeks ago that stopped with Imodium.  She had recurrent symptoms that started up a few days ago she has been taking Imodium she took Imodium twice a day.  She estimates that she has approximately 4 loose stools daily.  She has loose stools after she eats.  She denies any blood in the stool, fevers, or abdominal pain.  The patient has lost approximately 4 pounds since her last appointment.  The patient states that she is not hungry and nothing tastes good.  Does not have any evidence of thrush.  The patient patient is trying to drink 2-3 ensures a day but is not able to drink that many.  They are interested in referral to a nutritionist.  The patient also endorses a pressure ulcer.  She has been  using Desitin for this.  They are inquiring if she can have a home health referral.  Otherwise the patient denies any fevers or chills.  She denies any recent signs and symptoms of infection including sore throat, nasal congestion, skin infections, or dysuria.  She denies any hemoptysis.  She reports some chest discomfort in the breastbone.  She describes it as tight and "spasms".  She reports dyspnea with minimal exertion. She reports some nausea without vomiting. She has taken her anti-emetic once. She denies any vomiting or constipation.  She is currently followed by Dr. Mickeal Skinner, neuro oncologist regarding her leptomeningeal disease. She completed whole brain radiation. She has dry scaly skin on her scalp since having whole brain radiation. Denies any other rashes or skin changes. She also follows with her orthopedic physician Dr. Lorin Mercy, due to her bone metastases and risk for fracture.  She is here today for evaluation and repeat blood work.  MEDICAL HISTORY: Past Medical History:  Diagnosis Date  . Anxiety   . Dyslipidemia   . Essential hypertension   . GERD (gastroesophageal reflux disease)   . Hypothyroidism   . Left bundle branch block 06/04/2018  . NICM (nonischemic cardiomyopathy) ([facility])   . Nonischemic cardiomyopathy ([facility]) 06/04/2018   Ejection fraction 45% in summer 2019  . Sleep apnea     ALLERGIES:  has No Known Allergies.  MEDICATIONS:  Current Outpatient Medications  Medication Sig Dispense Refill  . aspirin EC 81 MG tablet Take 81 mg by mouth daily.    Marland Kitchen atorvastatin (LIPITOR) 10  MG tablet Take 1 tablet (10 mg total) by mouth daily at 6 PM. 30 tablet 2  . dexamethasone (DECADRON) 4 MG tablet Please take 1 tablet twice a day for 1 week, followed by 1 tablet per day for 1 week, followed by 1/2 a tablet for 1 week 26 tablet 0  . FLUoxetine (PROZAC) 10 MG tablet Take 1 tablet (10 mg total) by mouth daily. 30 tablet 3  . levETIRAcetam (KEPPRA) 500 MG tablet Take 1 tablet (500 mg  total) by mouth 2 (two) times daily. 60 tablet 1  . levothyroxine (SYNTHROID) 75 MCG tablet Take 1 tablet (75 mcg total) by mouth daily. 30 tablet 1  . losartan (COZAAR) 50 MG tablet Take 1 tablet (50 mg total) by mouth daily. 90 tablet 2  . methylPREDNISolone (MEDROL DOSEPAK) 4 MG TBPK tablet Use as instructed 21 tablet 0  . metoprolol succinate (TOPROL-XL) 50 MG 24 hr tablet Take 1 tablet (50 mg total) by mouth daily. Take with or immediately following a meal. 90 tablet 2  . osimertinib mesylate (TAGRISSO) 80 MG tablet Take 2 tablets (160 mg total) by mouth daily. Days 1-28. 60 tablet 3  . oxyCODONE-acetaminophen (PERCOCET/ROXICET) 5-325 MG tablet Take 1 tablet by mouth every 8 (eight) hours as needed for severe pain. 45 tablet 0  . pantoprazole (PROTONIX) 40 MG tablet Take 1 tablet (40 mg total) by mouth daily. 30 tablet 1  . prochlorperazine (COMPAZINE) 10 MG tablet Take 1 tablet (10 mg total) by mouth every 6 (six) hours as needed for nausea or vomiting. 45 tablet 5  . spironolactone (ALDACTONE) 25 MG tablet Take 0.5 tablets (12.5 mg total) by mouth daily. 45 tablet 3   No current facility-administered medications for this visit.    SURGICAL HISTORY:  Past Surgical History:  Procedure Laterality Date  . ABDOMINAL HYSTERECTOMY    . BLADDER NECK RECONSTRUCTION    . BRONCHIAL BIOPSY  01/12/2020   Procedure: BRONCHIAL BIOPSIES;  Surgeon: Garner Nash, DO;  Location: [facility];  Service: Pulmonary;;  . BRONCHIAL BRUSHINGS  01/12/2020   Procedure: BRONCHIAL BRUSHINGS;  Surgeon: Garner Nash, DO;  Location: [facility];  Service: Pulmonary;;  . BRONCHIAL NEEDLE ASPIRATION BIOPSY  01/12/2020   Procedure: BRONCHIAL NEEDLE ASPIRATION BIOPSIES;  Surgeon: Garner Nash, DO;  Location: [facility];  Service: Pulmonary;;  . BRONCHIAL WASHINGS  01/12/2020   Procedure: BRONCHIAL WASHINGS;  Surgeon: Garner Nash, DO;  Location: [facility];  Service: Pulmonary;;  . SHOULDER SURGERY    .  TONSILLECTOMY    . VIDEO BRONCHOSCOPY WITH ENDOBRONCHIAL ULTRASOUND  01/12/2020   Procedure: VIDEO BRONCHOSCOPY WITH ENDOBRONCHIAL ULTRASOUND;  Surgeon: Garner Nash, DO;  Location: [facility];  Service: Pulmonary;;    REVIEW OF SYSTEMS:   Review of Systems  Constitutional: Positive for fatigue, generalized weakness, decreased appetite, weight loss.  Negative for chills and fever. HENT: Negative for mouth sores, nosebleeds, sore throat and trouble swallowing.   Eyes: Negative for eye problems and icterus.  Respiratory: Positive for shortness of breath and cough. Negative for hemoptysis and wheezing.   Cardiovascular: Positive for chest discomfort in her sternum. Negative for leg swelling.  Gastrointestinal: Positive for diarrhea. Positive for nausea. Negative for abdominal pain, constipation, and vomiting.  Genitourinary: Negative for bladder incontinence, difficulty urinating, dysuria, frequency and hematuria.   Musculoskeletal: Negative for back pain, gait problem, neck pain and neck stiffness.  Skin: Positive for dry scaly scalp Neurological: Negative for dizziness, extremity weakness, gait problem, headaches,  light-headedness and seizures.  Hematological: Negative for adenopathy. Does not bruise/bleed easily.  Psychiatric/Behavioral: Negative for confusion, depression and sleep disturbance. The patient is not nervous/anxious.     PHYSICAL EXAMINATION:  Blood pressure 105/62, pulse 77, temperature 98.7 F (37.1 C), temperature source Oral, resp. rate 18, weight 184 lb 6.4 oz (83.6 kg), SpO2 96 %.  ECOG PERFORMANCE STATUS: 2-3  Physical Exam  Constitutional: Oriented to person, place, and time and chornically ill appearing female and in no distress.  HENT:  Head: Normocephalic and atraumatic.  Mouth/Throat: Oropharynx is clear and moist. No oropharyngeal exudate.  Eyes: Conjunctivae are normal. Right eye exhibits no discharge. Left eye exhibits no discharge. No scleral icterus.   Neck: Normal range of motion. Neck supple.  Cardiovascular: Normal rate, regular rhythm, normal heart sounds and intact distal pulses.   Pulmonary/Chest: Effort normal and breath sounds normal. No respiratory distress. No wheezes. No rales.  Abdominal: Soft. Bowel sounds are normal. Exhibits no distension and no mass.Some tenderness in umbilical area.  Musculoskeletal: Normal range of motion. Exhibits no edema.  Lymphadenopathy:    No cervical adenopathy.  Neurological: Alert and oriented to person, place, and time. Exhibits muscle wasting.  The patient was examined in the wheelchair.   Skin: Skin is warm and dry. Dry scaly scalp.  Psychiatric: Mood, memory and judgment normal.  Vitals reviewed.  LABORATORY DATA: Lab Results  Component Value Date   WBC 3.3 (L) 02/25/2020   HGB 12.8 02/25/2020   HCT 39.7 02/25/2020   MCV 88.6 02/25/2020   PLT 88 (L) 02/25/2020      Chemistry      Component Value Date/Time   NA 138 02/25/2020 1441   K 4.1 02/25/2020 1441   CL 106 02/25/2020 1441   CO2 24 02/25/2020 1441   BUN 18 02/25/2020 1441   CREATININE 1.06 (H) 02/25/2020 1441      Component Value Date/Time   CALCIUM 9.3 02/25/2020 1441   ALKPHOS 188 (H) 02/25/2020 1441   AST 14 (L) 02/25/2020 1441   ALT 17 02/25/2020 1441   BILITOT 0.5 02/25/2020 1441       RADIOGRAPHIC STUDIES:  No results found.   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  The patient completed whole brain radiation as well a palliative radiotherapy to the osseous metastasesand primary lung  lesion under the care of Dr. Lisbeth Renshaw. Her last treatment was on 02/02/2020.  The patient is currently undergoing oral chemotherapy with Tagrisso 160 mg p.o. daily. Her first dose was on 02/01/20.  The patient was seen with Dr. Julien Nordmann today.  Labs were reviewed. Her labs show some thrombocytopenia with a platelet count of 88k.  Dr. Julien Nordmann recommends that she continue on the same treatment at the same dose.  I will arrange for restaging CT scan of the chest, abdomen, and pelvis prior to starting her next cycle of treatment.  We will see her back for follow-up visit in 5 weeks for evaluation, to review her scan, and repeat blood work.  She will receive her COVID-19 booster today as scheduled.   Regarding the patient's dry and scaly scalp, Dr. Julien Nordmann believes  this is likely secondary to whole brain radiation.  Dr. Earlie Server recommends using dandruff shampoo or hydrocortisone cream.  If no improvement in her symptoms, then the patient was advised to call radiation oncology for further recommendations.  Regarding her diarrhea, Dr. Earlie Server encouraged the patient to continue taking Imodium.  He advised the patient to start taking 2 Imodium's in the morning regardless if she has had diarrhea or not.  Regarding her decreased appetite, I will place a referral to a member the nutritionist team to further evaluate the patient and gave her recommendations on high caloric food recommendations.  Regarding her pressure ulcer, I will place a referral to home health for wound care.  The patient is advised to change her position every 2 hours.  She will continue using Desitin.  The patient was advised to call immediately if she has any concerning symptoms in the interval. The patient voices understanding of current disease status and treatment options and is in agreement with the current care plan. All questions were answered. The patient knows to call the clinic with any problems, questions or concerns. We can  certainly see the patient much sooner if necessary     Orders Placed This Encounter  Procedures  . CT Chest W Contrast    Standing Status:   Future    Standing Expiration Date:   02/24/2021    Order Specific Question:   If indicated for the ordered procedure, I authorize the administration of contrast media per Radiology protocol    Answer:   Yes    Order Specific Question:   Preferred imaging location?    Answer:   [facility]  . CT Abdomen Pelvis W Contrast    Standing Status:   Future    Standing Expiration Date:   02/24/2021    Order Specific Question:   If indicated for the ordered procedure, I authorize the administration of contrast media per Radiology protocol    Answer:   Yes    Order Specific Question:   Preferred imaging location?    Answer:   [facility]    Order Specific Question:   Is Oral Contrast requested for this exam?    Answer:   Yes, Per Radiology protocol  . CMP ([facility] only)    Standing Status:   Future    Standing Expiration Date:   02/24/2021  . CBC with Differential (Cancer Center Only)    Standing Status:   Future    Standing Expiration Date:   02/24/2021  . Ambulatory referral to Nutrition and Diabetic E    Referral Priority:   Routine    Referral Type:   Consultation    Referral Reason:   Specialty Services Required    Number of Visits Requested:   [place], PA-C 02/25/20  ADDENDUM: Hematology/Oncology Attending: I had a face-to-face encounter with the patient.  I recommended her care plan.  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  The patient underwent whole brain irradiation.  She is currently on treatment with targeted therapy with Tagrisso 160 mg p.o. daily.  She has been tolerating this treatment well but she has few issues  including rash especially on the scalp after the whole brain irradiation.  She also has several episodes of diarrhea but she does not take Imodium as  prescribed.  The patient also has pressure ulcer. I recommended for her to continue her current treatment with Tagrisso with the same dose. For the rash, she will apply hydrocortisone cream and dandruff shampoo and if no improvement she will consult with radiation oncology regarding the radiation-induced skin burn and rash. For the diarrhea she was advised to take 2 Imodium every morning and 1 Imodium after every episodes of diarrhea with a maximum of 16 mg p.o. daily. For the pressure ulcer, will refer the patient to wound care service and home health. She will come back for follow-up visit in around 5 weeks with repeat CT scan of the chest, abdomen pelvis for restaging of her disease. She was advised to call immediately if she has any concerning symptoms in the interval.  Disclaimer: This note was dictated with voice recognition software. Similar sounding words can inadvertently be transcribed and may be missed upon review. Eilleen Kempf, MD 02/27/20

## 2020-02-24 ENCOUNTER — Encounter: Payer: Self-pay | Admitting: *Deleted

## 2020-02-24 NOTE — Progress Notes (Signed)
Oncology Nurse Navigator Documentation  Oncology Nurse Navigator Flowsheets 02/24/2020  Abnormal Finding Date 01/09/2020  Confirmed Diagnosis Date 02/11/2020  Diagnosis Status Confirmed Diagnosis Complete  Planned Course of Treatment Radiation;Targeted Therapy  Phase of Treatment Targeted Therapy  Radiation Actual Start Date: 01/18/2020  Radiation Actual End Date: 02/02/2020  Targeted Therapy Actual Start Date: 02/01/2020  Navigator Follow Up Date: 02/24/2020  Navigator Follow Up Reason: Appointment Review;Review Note  Navigation Complete Date: 02/24/2020  Post Navigation: Continue to Follow Patient? No  Reason Not Navigating Patient: Other:  Primary school teacher [employer]  Navigator Encounter Type Other:  Treatment Initiated Date 01/18/2020  Treatment Phase Treatment  Barriers/Navigation Needs Coordination of Care  Interventions Coordination of Care  Acuity Level 2-Minimal Needs (1-2 Barriers Identified)  Time Spent with Patient 30

## 2020-02-25 ENCOUNTER — Inpatient Hospital Stay: Payer: Medicare HMO

## 2020-02-25 ENCOUNTER — Other Ambulatory Visit: Payer: Self-pay

## 2020-02-25 ENCOUNTER — Inpatient Hospital Stay: Payer: Medicare HMO | Admitting: Physician Assistant

## 2020-02-25 ENCOUNTER — Encounter: Payer: Self-pay | Admitting: Physician Assistant

## 2020-02-25 VITALS — BP 105/62 | HR 77 | Temp 98.7°F | Resp 18 | Wt 184.4 lb

## 2020-02-25 DIAGNOSIS — C3492 Malignant neoplasm of unspecified part of left bronchus or lung: Secondary | ICD-10-CM

## 2020-02-25 DIAGNOSIS — Z23 Encounter for immunization: Secondary | ICD-10-CM | POA: Diagnosis not present

## 2020-02-25 DIAGNOSIS — C3432 Malignant neoplasm of lower lobe, left bronchus or lung: Secondary | ICD-10-CM | POA: Diagnosis not present

## 2020-02-25 DIAGNOSIS — C7951 Secondary malignant neoplasm of bone: Secondary | ICD-10-CM | POA: Diagnosis present

## 2020-02-25 DIAGNOSIS — R630 Anorexia: Secondary | ICD-10-CM | POA: Diagnosis not present

## 2020-02-25 DIAGNOSIS — C7931 Secondary malignant neoplasm of brain: Secondary | ICD-10-CM | POA: Diagnosis present

## 2020-02-25 LAB — CBC WITH DIFFERENTIAL (CANCER CENTER ONLY)
Abs Immature Granulocytes: 0.01 10*3/uL (ref 0.00–0.07)
Basophils Absolute: 0 10*3/uL (ref 0.0–0.1)
Basophils Relative: 0 %
Eosinophils Absolute: 0.1 10*3/uL (ref 0.0–0.5)
Eosinophils Relative: 3 %
HCT: 39.7 % (ref 36.0–46.0)
Hemoglobin: 12.8 g/dL (ref 12.0–15.0)
Immature Granulocytes: 0 %
Lymphocytes Relative: 8 %
Lymphs Abs: 0.3 10*3/uL — ABNORMAL LOW (ref 0.7–4.0)
MCH: 28.6 pg (ref 26.0–34.0)
MCHC: 32.2 g/dL (ref 30.0–36.0)
MCV: 88.6 fL (ref 80.0–100.0)
Monocytes Absolute: 0.3 10*3/uL (ref 0.1–1.0)
Monocytes Relative: 8 %
Neutro Abs: 2.7 10*3/uL (ref 1.7–7.7)
Neutrophils Relative %: 81 %
Platelet Count: 88 10*3/uL — ABNORMAL LOW (ref 150–400)
RBC: 4.48 MIL/uL (ref 3.87–5.11)
RDW: 16.2 % — ABNORMAL HIGH (ref 11.5–15.5)
WBC Count: 3.3 10*3/uL — ABNORMAL LOW (ref 4.0–10.5)
nRBC: 0 % (ref 0.0–0.2)

## 2020-02-25 LAB — CMP (CANCER CENTER ONLY)
ALT: 17 U/L (ref 0–44)
AST: 14 U/L — ABNORMAL LOW (ref 15–41)
Albumin: 3.1 g/dL — ABNORMAL LOW (ref 3.5–5.0)
Alkaline Phosphatase: 188 U/L — ABNORMAL HIGH (ref 38–126)
Anion gap: 8 (ref 5–15)
BUN: 18 mg/dL (ref 8–23)
CO2: 24 mmol/L (ref 22–32)
Calcium: 9.3 mg/dL (ref 8.9–10.3)
Chloride: 106 mmol/L (ref 98–111)
Creatinine: 1.06 mg/dL — ABNORMAL HIGH (ref 0.44–1.00)
GFR, Estimated: 56 mL/min — ABNORMAL LOW (ref >60)
Glucose, Bld: 97 mg/dL (ref 70–99)
Potassium: 4.1 mmol/L (ref 3.5–5.1)
Sodium: 138 mmol/L (ref 135–145)
Total Bilirubin: 0.5 mg/dL (ref 0.3–1.2)
Total Protein: 6.3 g/dL — ABNORMAL LOW (ref 6.5–8.1)

## 2020-02-25 MED ORDER — METHYLPREDNISOLONE 4 MG PO TBPK: ORAL_TABLET | ORAL | 0 refills | Status: DC

## 2020-02-25 NOTE — Progress Notes (Signed)
   Covid-19 Vaccination Clinic  Name:  Sally-Anne Wamble    MRN: [id number] DOB: May 15, 1945  02/25/2020  Ms. Angelo was observed post Covid-19 immunization for 15 minutes without incident. She was provided with Vaccine Information Sheet and instruction to access the V-Safe system.   Ms. Corle was instructed to call 911 with any severe reactions post vaccine: Marland Kitchen Difficulty breathing  . Swelling of face and throat  . A fast heartbeat  . A bad rash all over body  . Dizziness and weakness

## 2020-02-26 ENCOUNTER — Telehealth: Payer: Self-pay | Admitting: Internal Medicine

## 2020-02-26 NOTE — Telephone Encounter (Signed)
Scheduled appointment per 10/22 sch msg. Spoke to patient who is aware of appointment date and time.

## 2020-02-27 ENCOUNTER — Encounter: Payer: Self-pay | Admitting: Physician Assistant

## 2020-02-29 ENCOUNTER — Ambulatory Visit: Payer: Medicare HMO | Admitting: Cardiology

## 2020-02-29 ENCOUNTER — Encounter: Payer: Self-pay | Admitting: Cardiology

## 2020-02-29 ENCOUNTER — Other Ambulatory Visit: Payer: Self-pay

## 2020-02-29 VITALS — BP 140/72 | HR 65 | Ht 66.0 in | Wt 183.1 lb

## 2020-02-29 DIAGNOSIS — I447 Left bundle-branch block, unspecified: Secondary | ICD-10-CM | POA: Diagnosis not present

## 2020-02-29 DIAGNOSIS — C3492 Malignant neoplasm of unspecified part of left bronchus or lung: Secondary | ICD-10-CM | POA: Diagnosis not present

## 2020-02-29 DIAGNOSIS — I428 Other cardiomyopathies: Secondary | ICD-10-CM

## 2020-02-29 DIAGNOSIS — I10 Essential (primary) hypertension: Secondary | ICD-10-CM

## 2020-02-29 DIAGNOSIS — C3432 Malignant neoplasm of lower lobe, left bronchus or lung: Secondary | ICD-10-CM | POA: Diagnosis not present

## 2020-02-29 DIAGNOSIS — E785 Hyperlipidemia, unspecified: Secondary | ICD-10-CM

## 2020-02-29 NOTE — Patient Instructions (Signed)
Medication Instructions:  Your physician recommends that you continue on your current medications as directed. Please refer to the Current Medication list given to you today.  *If you need a refill on your cardiac medications before your next appointment, please call your pharmacy*   Lab Work: None. If you have labs (blood work) drawn today and your tests are completely normal, you will receive your results only by: Marland Kitchen MyChart Message (if you have MyChart) OR . A paper copy in the mail If you have any lab test that is abnormal or we need to change your treatment, we will call you to review the results.   Testing/Procedures: none   Follow-Up: At [facility], you and your health needs are our priority.  As part of our continuing mission to provide you with exceptional heart care, we have created designated Provider Care Teams.  These Care Teams include your primary Cardiologist (physician) and Advanced Practice Providers (APPs -  Physician Assistants and Nurse Practitioners) who all work together to provide you with the care you need, when you need it.  We recommend signing up for the patient portal called "MyChart".  Sign up information is provided on this After Visit Summary.  MyChart is used to connect with patients for Virtual Visits (Telemedicine).  Patients are able to view lab/test results, encounter notes, upcoming appointments, etc.  Non-urgent messages can be sent to your provider as well.   To learn more about what you can do with MyChart, go to [email or web address].    Your next appointment:   6 month(s)  The format for your next appointment:   In Person  Provider:   Jenne Campus, MD   Other Instructions

## 2020-02-29 NOTE — Progress Notes (Signed)
Cardiology Office Note:    Date:  02/29/2020   ID:  Stefanie Camacho, DOB [birth year], MRN [id number]  PCP:  [name], MD  Cardiologist:  Jenne Campus, MD    Referring MD: [name], MD   No chief complaint on file. I am doing okay  History of Present Illness:    Stefanie Camacho is a 74 y.o. 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  Overall seems to be tolerating it quite well.  Denies have any cardiac complaints.  There is no dizziness no passing out.  Past Medical History:  Diagnosis Date  . Anxiety   . Dyslipidemia   . Essential hypertension   . GERD (gastroesophageal reflux disease)   . Hypothyroidism   . Left bundle branch block 06/04/2018  . NICM (nonischemic cardiomyopathy) ([place])   . Nonischemic cardiomyopathy ([place]) 06/04/2018   Ejection fraction 45% in summer 2019  . Sleep apnea     Past Surgical History:  Procedure Laterality Date  . ABDOMINAL HYSTERECTOMY    . BLADDER NECK RECONSTRUCTION    . BRONCHIAL BIOPSY  01/12/2020   Procedure: BRONCHIAL BIOPSIES;  Surgeon: Garner Nash, DO;  Location: [facility];  Service: Pulmonary;;  . BRONCHIAL BRUSHINGS  01/12/2020   Procedure: BRONCHIAL BRUSHINGS;  Surgeon: Garner Nash, DO;  Location: [facility];  Service: Pulmonary;;  . BRONCHIAL NEEDLE ASPIRATION BIOPSY  01/12/2020   Procedure: BRONCHIAL NEEDLE ASPIRATION BIOPSIES;  Surgeon: Garner Nash, DO;  Location: [facility];  Service: Pulmonary;;  . BRONCHIAL WASHINGS  01/12/2020   Procedure: BRONCHIAL WASHINGS;  Surgeon: Garner Nash, DO;  Location: [facility];  Service: Pulmonary;;  . SHOULDER  SURGERY    . TONSILLECTOMY    . VIDEO BRONCHOSCOPY WITH ENDOBRONCHIAL ULTRASOUND  01/12/2020   Procedure: VIDEO BRONCHOSCOPY WITH ENDOBRONCHIAL ULTRASOUND;  Surgeon: Garner Nash, DO;  Location: [facility];  Service: Pulmonary;;    Current Medications: Current Meds  Medication Sig  . aspirin EC 81 MG tablet Take 81 mg by mouth daily.  Marland Kitchen atorvastatin (LIPITOR) 10 MG tablet Take 1 tablet (10 mg total) by mouth daily at 6 PM.  . dexamethasone (DECADRON) 4 MG tablet Please take 1 tablet twice a day for 1 week, followed by 1 tablet per day for 1 week, followed by 1/2 a tablet for 1 week  . FLUoxetine (PROZAC) 10 MG tablet Take 1 tablet (10 mg total) by mouth daily.  Marland Kitchen levETIRAcetam (KEPPRA) 500 MG tablet Take 1 tablet (500 mg total) by mouth 2 (two) times daily.  Marland Kitchen levothyroxine (SYNTHROID) 75 MCG tablet Take 1 tablet (75 mcg total) by mouth daily.  Marland Kitchen losartan (COZAAR) 50 MG tablet Take 1 tablet (50 mg total) by mouth daily.  . methylPREDNISolone (MEDROL DOSEPAK) 4 MG TBPK tablet Use as instructed  . metoprolol succinate (TOPROL-XL) 50 MG 24 hr tablet Take 1 tablet (50 mg total) by mouth daily. Take with or immediately following a meal.  . osimertinib mesylate (TAGRISSO) 80 MG tablet Take 2 tablets (160 mg total) by mouth daily. Days 1-28.  Marland Kitchen oxyCODONE-acetaminophen (PERCOCET/ROXICET) 5-325 MG tablet Take 1 tablet by mouth every 8 (eight) hours as needed for severe pain.  . pantoprazole (PROTONIX) 40 MG tablet Take 1 tablet (40  mg total) by mouth daily.  . prochlorperazine (COMPAZINE) 10 MG tablet Take 1 tablet (10 mg total) by mouth every 6 (six) hours as needed for nausea or vomiting.  Marland Kitchen spironolactone (ALDACTONE) 25 MG tablet Take 0.5 tablets (12.5 mg total) by mouth daily.     Allergies:   Patient has no known allergies.   Social History   Socioeconomic History  . Marital status: Divorced    Spouse name: Not on file  . Number of children: Not on file  . Years of education: Not on  file  . Highest education level: Not on file  Occupational History  . Not on file  Tobacco Use  . Smoking status: Never Smoker  . Smokeless tobacco: Never Used  Vaping Use  . Vaping Use: Never used  Substance and Sexual Activity  . Alcohol use: Not Currently  . Drug use: Never  . Sexual activity: Not on file  Other Topics Concern  . Not on file  Social History Narrative  . Not on file   Social Determinants of Health   Financial Resource Strain:   . Difficulty of Paying Living Expenses: Not on file  Food Insecurity:   . Worried About Charity fundraiser in the Last Year: Not on file  . Ran Out of Food in the Last Year: Not on file  Transportation Needs:   . Lack of Transportation (Medical): Not on file  . Lack of Transportation (Non-Medical): Not on file  Physical Activity:   . Days of Exercise per Week: Not on file  . Minutes of Exercise per Session: Not on file  Stress:   . Feeling of Stress : Not on file  Social Connections:   . Frequency of Communication with Friends and Family: Not on file  . Frequency of Social Gatherings with Friends and Family: Not on file  . Attends Religious Services: Not on file  . Active Member of Clubs or Organizations: Not on file  . Attends Archivist Meetings: Not on file  . Marital Status: Not on file     Family History: The patient's family history includes Heart disease in her father, maternal grandfather, mother, and paternal grandfather; Stroke in her father and mother. ROS:   Please see the history of present illness.    All 14 point review of systems negative except as described per history of present illness  EKGs/Labs/Other Studies Reviewed:      Recent Labs: 02/25/2020: ALT 17; BUN 18; Creatinine 1.06; Hemoglobin 12.8; Platelet Count 88; Potassium 4.1; Sodium 138  Recent Lipid Panel    Component Value Date/Time   CHOL 127 01/09/2020 0416   CHOL 133 07/17/2018 0933   TRIG 112 01/09/2020 0416   HDL 38 (L)  01/09/2020 0416   HDL 40 07/17/2018 0933   CHOLHDL 3.3 01/09/2020 0416   VLDL 22 01/09/2020 0416   LDLCALC 67 01/09/2020 0416   LDLCALC 67 07/17/2018 0933    Physical Exam:    VS:  BP 140/72   Pulse 65   Ht 5\' 6"  (1.676 m)   Wt 183 lb 1.3 oz (83 kg)   SpO2 96%   BMI 29.55 kg/m     Wt Readings from Last 3 Encounters:  02/29/20 183 lb 1.3 oz (83 kg)  02/25/20 184 lb 6.4 oz (83.6 kg)  02/11/20 188 lb 11.2 oz (85.6 kg)     GEN:  Well nourished, well developed in no acute distress HEENT: Normal NECK: No JVD; No carotid bruits  LYMPHATICS: No lymphadenopathy CARDIAC: RRR, no murmurs, no rubs, no gallops RESPIRATORY:  Clear to auscultation without rales, wheezing or rhonchi  ABDOMEN: Soft, non-tender, non-distended MUSCULOSKELETAL:  No edema; No deformity  SKIN: Warm and dry LOWER EXTREMITIES: no swelling NEUROLOGIC:  Alert and oriented x 3 PSYCHIATRIC:  Normal affect   ASSESSMENT:    1. NICM (nonischemic cardiomyopathy) ([place])   2. Dyslipidemia   3. Malignant neoplasm of lower lobe of left lung ([place])   4. Adenocarcinoma of left lung, stage 4 ([place])   5. Left bundle branch block   6. Essential hypertension    PLAN:    In order of problems listed above:  1. Nonischemic cardiomyopathy.  Ejection fraction actually normalized we did review her echocardiogram which showed improvement to normalization of left ventricle ejection fraction.  I still think it would be reasonable to continue with current medications.  That include Cozaar as well as beta-blocker in form of Toprol I will continue. 2. Dyslipidemia: I did review her K PN I do have data from January 09, 2020 with LDL of 67 HDL 38.  We will continue present management which include Lipitor 10. 3. Malignant lung cancer with metastasis managed excellently by oncology team. 4. Left bundle branch block: Chronic problem noted. 5. Essential hypertension blood pressure slightly on the higher side but she said when she check it  at home is always good.   Medication Adjustments/Labs and Tests Ordered: Current medicines are reviewed at length with the patient today.  Concerns regarding medicines are outlined above.  No orders of the defined types were placed in this encounter.  Medication changes: No orders of the defined types were placed in this encounter.   Signed, Park Liter, MD, [facility] 02/29/2020 10:44 AM    [place]

## 2020-03-01 DIAGNOSIS — R399 Unspecified symptoms and signs involving the genitourinary system: Secondary | ICD-10-CM | POA: Diagnosis not present

## 2020-03-01 DIAGNOSIS — E039 Hypothyroidism, unspecified: Secondary | ICD-10-CM | POA: Diagnosis not present

## 2020-03-01 DIAGNOSIS — R8290 Unspecified abnormal findings in urine: Secondary | ICD-10-CM | POA: Diagnosis not present

## 2020-03-04 ENCOUNTER — Other Ambulatory Visit: Payer: Self-pay | Admitting: Physician Assistant

## 2020-03-04 DIAGNOSIS — K219 Gastro-esophageal reflux disease without esophagitis: Secondary | ICD-10-CM

## 2020-03-04 MED ORDER — PANTOPRAZOLE SODIUM 40 MG PO TBEC: 40.0000 mg | DELAYED_RELEASE_TABLET | Freq: Every day | ORAL | 1 refills | Status: DC

## 2020-03-07 ENCOUNTER — Telehealth: Payer: Self-pay | Admitting: Radiation Oncology

## 2020-03-07 NOTE — Telephone Encounter (Signed)
  Radiation Oncology         [phone] ________________________________  Name: Stefanie Camacho MRN: [id number]  Date of Service: 03/07/2020  DOB: 1945-09-20  Post Treatment Telephone Note  Diagnosis:   Stage IV non-small cell lung cancer, adenocarcinoma of the left lower lobe with brain, lung, and bone metastases.  Interval Since Last Radiation:  5 weeks   01/18/20-02/02/20: The whole brain, left lung target, left pubic region of the pelvis, and right SI region were treated to 30 Gy in 10 fractions.  Narrative:  The patient was contacted today for routine follow-up. During treatment she did very well with radiotherapy and did not have significant desquamation.  She has decided to pursue therapy with medical oncology and is receiving Tagrisso since her cancer was found to be EGFR mutated.  Impression/Plan: 1. Stage IV non-small cell lung cancer, adenocarcinoma, EGFR mudated, of the left lower lobe with brain, lung, and bone metastases. The patient has been doing well since completion of radiotherapy from what I can read in her notes. I left her a voicemail asking how she was feeling and if she would like to be followed in an aggressively in surveillance for her brain. She will continue with Dr. Julien Nordmann as well while receiving Tagrisso.    Carola Rhine, PAC

## 2020-03-08 ENCOUNTER — Inpatient Hospital Stay: Payer: Medicare HMO | Attending: Internal Medicine | Admitting: Nutrition

## 2020-03-08 ENCOUNTER — Other Ambulatory Visit: Payer: Self-pay

## 2020-03-08 DIAGNOSIS — Z9221 Personal history of antineoplastic chemotherapy: Secondary | ICD-10-CM | POA: Insufficient documentation

## 2020-03-08 DIAGNOSIS — C7951 Secondary malignant neoplasm of bone: Secondary | ICD-10-CM | POA: Insufficient documentation

## 2020-03-08 DIAGNOSIS — Z7901 Long term (current) use of anticoagulants: Secondary | ICD-10-CM | POA: Insufficient documentation

## 2020-03-08 DIAGNOSIS — Z79899 Other long term (current) drug therapy: Secondary | ICD-10-CM | POA: Insufficient documentation

## 2020-03-08 DIAGNOSIS — E785 Hyperlipidemia, unspecified: Secondary | ICD-10-CM | POA: Insufficient documentation

## 2020-03-08 DIAGNOSIS — Z923 Personal history of irradiation: Secondary | ICD-10-CM | POA: Insufficient documentation

## 2020-03-08 DIAGNOSIS — R531 Weakness: Secondary | ICD-10-CM | POA: Insufficient documentation

## 2020-03-08 DIAGNOSIS — Z90710 Acquired absence of both cervix and uterus: Secondary | ICD-10-CM | POA: Insufficient documentation

## 2020-03-08 DIAGNOSIS — I10 Essential (primary) hypertension: Secondary | ICD-10-CM | POA: Insufficient documentation

## 2020-03-08 DIAGNOSIS — C3432 Malignant neoplasm of lower lobe, left bronchus or lung: Secondary | ICD-10-CM | POA: Insufficient documentation

## 2020-03-08 DIAGNOSIS — Z86711 Personal history of pulmonary embolism: Secondary | ICD-10-CM | POA: Insufficient documentation

## 2020-03-08 DIAGNOSIS — K219 Gastro-esophageal reflux disease without esophagitis: Secondary | ICD-10-CM | POA: Insufficient documentation

## 2020-03-08 DIAGNOSIS — Z7982 Long term (current) use of aspirin: Secondary | ICD-10-CM | POA: Insufficient documentation

## 2020-03-08 DIAGNOSIS — F419 Anxiety disorder, unspecified: Secondary | ICD-10-CM | POA: Insufficient documentation

## 2020-03-08 DIAGNOSIS — G4730 Sleep apnea, unspecified: Secondary | ICD-10-CM | POA: Insufficient documentation

## 2020-03-08 DIAGNOSIS — E039 Hypothyroidism, unspecified: Secondary | ICD-10-CM | POA: Insufficient documentation

## 2020-03-08 DIAGNOSIS — C7931 Secondary malignant neoplasm of brain: Secondary | ICD-10-CM | POA: Insufficient documentation

## 2020-03-08 NOTE — Progress Notes (Signed)
74 year old female patient of Dr. Julien Nordmann.  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  Molecular Biomarkers:Positive for EGFR mutation  PRIOR THERAPY: Whole brain radiation and radiotherapy to the osseous metastases under the care of Dr. Lisbeth Renshaw. Last treatment expected on 02/02/20  CURRENT THERAPY: Tagrisso160 mg p.o. daily. First dose on9/27/21.  Past medical history includes anxiety.  Medications include Protonix, Compazine and Imodium.  Labs include creatinine 1.06 and albumin 3.1.  Height: 5 feet 6 inches. Weight: 182.2 pounds. Usual body weight: 200 pounds in March 2021. BMI: 29.41.  Patient reports poor appetite and taste alterations. Describes some food tastes too salty and too bitter. She has diarrhea 1 time a day and takes 2 Imodium. She is able to tolerate about 1 carton of oral nutrition supplement such as Premier protein or Ensure. States she is lactose intolerant. Reports pressure ulcer is almost healed.  Nutrition diagnosis:  Unintended weight loss related to cancer and associated treatments as evidenced by 9% weight loss over 7 months which is concerning but not significant.  Intervention: Educated patient to take nausea medicine as prescribed to help with oral intake. Educated on improving taste alterations. Encourage increase oral nutrition supplements 2 cartons daily and provided samples and coupons. Provided fact sheets and contact information.  Questions were answered.  Teach back method used.  Monitoring, evaluation, goals: Patient will work to increase calories and protein to promote weight maintenance needs continued healing.  Next visit: Patient/family will contact  me with questions or concerns for follow-up appointment  **Disclaimer: This note was dictated with voice recognition software. Similar sounding words can inadvertently be transcribed and this note may contain transcription errors which may not have been corrected upon publication of note.**

## 2020-03-09 ENCOUNTER — Telehealth: Payer: Self-pay | Admitting: Medical Oncology

## 2020-03-09 ENCOUNTER — Other Ambulatory Visit: Payer: Self-pay | Admitting: *Deleted

## 2020-03-09 ENCOUNTER — Telehealth: Payer: Self-pay

## 2020-03-09 MED ORDER — LEVETIRACETAM 500 MG PO TABS: 500.0000 mg | ORAL_TABLET | Freq: Two times a day (BID) | ORAL | 1 refills | Status: DC

## 2020-03-09 NOTE — Telephone Encounter (Signed)
Yes.  Thank you.

## 2020-03-09 NOTE — Telephone Encounter (Signed)
Pt requesting refill for Keppra. Last rx on 9/8 was prescribed  by hospitalist.

## 2020-03-09 NOTE — Telephone Encounter (Signed)
Pt sister, Stefanie Camacho, called requesting a call back. I have spoken to pts sister Stefanie Camacho who relays the following:  1. Pt has concerns she cannot complete drinking the contrast for her upcoming CT Scan. She states she will try but she is likely to vomit it up as he has only been able to drink about 4oz of liquids daily. Is there any other option?  2. Pt has a "raspy cough" and is there anything pt can take. I discussed with Dr. Julien Nordmann who advised for pt to take Delsym and see if this helps. Information has been relayed to [facility] who expressed understanding of this information. 3. Lastly, pt was given rx of Keppra by dr. Karleen Hampshire while in the hospital and needs a refill. This request was given to [facility], covering for Dr. Mickeal Skinner (pt also sees) and a refill was sent today 03/09/20. Information has been relayed to [facility] who expressed understanding of this information.

## 2020-03-14 ENCOUNTER — Telehealth: Payer: Self-pay

## 2020-03-14 NOTE — Telephone Encounter (Signed)
Pt LM requesting a refill of Pantoprazole and Atoravastatin.  I have spoken with the pt and advised she needs to contact her PCP's office for these refills as they are not prescribed by Dr. Julien Nordmann. Pt expressed understanding of this information.

## 2020-03-16 ENCOUNTER — Telehealth: Payer: Self-pay

## 2020-03-16 NOTE — Telephone Encounter (Signed)
I left a message for pt at [phone] reminding her to schedule her CT scans. I also provided the number to radiology for scheduling.

## 2020-03-17 ENCOUNTER — Other Ambulatory Visit: Payer: Self-pay | Admitting: Medical Oncology

## 2020-03-17 ENCOUNTER — Telehealth: Payer: Self-pay | Admitting: Physician Assistant

## 2020-03-17 ENCOUNTER — Telehealth: Payer: Self-pay | Admitting: Medical Oncology

## 2020-03-17 DIAGNOSIS — C3492 Malignant neoplasm of unspecified part of left bronchus or lung: Secondary | ICD-10-CM

## 2020-03-17 NOTE — Telephone Encounter (Addendum)
Returned call and pt is weaker and coughing more , not able to eat or drink . Short winded, speech slurred. Taking Tagrisso. Pt cannot drink barium for scan -noted in radiology order.  Asking for additional care.   [place]

## 2020-03-17 NOTE — Telephone Encounter (Signed)
Lvm with referral to [facility] care -order placed.

## 2020-03-17 NOTE — Telephone Encounter (Signed)
I called the patient to let her know that Dr. Julien Nordmann and I will also see her tomorrow at 9 AM. Instructed to come earlier. She expressed understanding. Will send scheduling message

## 2020-03-18 ENCOUNTER — Other Ambulatory Visit: Payer: Self-pay

## 2020-03-18 ENCOUNTER — Inpatient Hospital Stay (HOSPITAL_BASED_OUTPATIENT_CLINIC_OR_DEPARTMENT_OTHER): Payer: Medicare HMO | Admitting: Internal Medicine

## 2020-03-18 ENCOUNTER — Inpatient Hospital Stay (HOSPITAL_BASED_OUTPATIENT_CLINIC_OR_DEPARTMENT_OTHER): Payer: Medicare HMO | Admitting: Physician Assistant

## 2020-03-18 ENCOUNTER — Ambulatory Visit: Payer: Medicare HMO | Admitting: Physician Assistant

## 2020-03-18 ENCOUNTER — Inpatient Hospital Stay: Payer: Medicare HMO

## 2020-03-18 ENCOUNTER — Encounter: Payer: Self-pay | Admitting: Physician Assistant

## 2020-03-18 DIAGNOSIS — C3492 Malignant neoplasm of unspecified part of left bronchus or lung: Secondary | ICD-10-CM

## 2020-03-18 DIAGNOSIS — E039 Hypothyroidism, unspecified: Secondary | ICD-10-CM | POA: Diagnosis not present

## 2020-03-18 DIAGNOSIS — R69 Illness, unspecified: Secondary | ICD-10-CM | POA: Diagnosis not present

## 2020-03-18 DIAGNOSIS — R630 Anorexia: Secondary | ICD-10-CM

## 2020-03-18 DIAGNOSIS — I10 Essential (primary) hypertension: Secondary | ICD-10-CM | POA: Diagnosis not present

## 2020-03-18 DIAGNOSIS — Z79899 Other long term (current) drug therapy: Secondary | ICD-10-CM | POA: Diagnosis not present

## 2020-03-18 DIAGNOSIS — E785 Hyperlipidemia, unspecified: Secondary | ICD-10-CM | POA: Diagnosis not present

## 2020-03-18 DIAGNOSIS — C3432 Malignant neoplasm of lower lobe, left bronchus or lung: Secondary | ICD-10-CM | POA: Diagnosis not present

## 2020-03-18 DIAGNOSIS — R531 Weakness: Secondary | ICD-10-CM

## 2020-03-18 DIAGNOSIS — C7951 Secondary malignant neoplasm of bone: Secondary | ICD-10-CM | POA: Diagnosis present

## 2020-03-18 DIAGNOSIS — Z90710 Acquired absence of both cervix and uterus: Secondary | ICD-10-CM | POA: Diagnosis not present

## 2020-03-18 DIAGNOSIS — C7931 Secondary malignant neoplasm of brain: Secondary | ICD-10-CM

## 2020-03-18 DIAGNOSIS — R059 Cough, unspecified: Secondary | ICD-10-CM

## 2020-03-18 DIAGNOSIS — F419 Anxiety disorder, unspecified: Secondary | ICD-10-CM | POA: Diagnosis not present

## 2020-03-18 DIAGNOSIS — Z7982 Long term (current) use of aspirin: Secondary | ICD-10-CM | POA: Diagnosis not present

## 2020-03-18 DIAGNOSIS — K219 Gastro-esophageal reflux disease without esophagitis: Secondary | ICD-10-CM | POA: Diagnosis not present

## 2020-03-18 DIAGNOSIS — C7949 Secondary malignant neoplasm of other parts of nervous system: Secondary | ICD-10-CM

## 2020-03-18 DIAGNOSIS — Z7901 Long term (current) use of anticoagulants: Secondary | ICD-10-CM | POA: Diagnosis not present

## 2020-03-18 DIAGNOSIS — Z923 Personal history of irradiation: Secondary | ICD-10-CM | POA: Diagnosis not present

## 2020-03-18 DIAGNOSIS — G4730 Sleep apnea, unspecified: Secondary | ICD-10-CM | POA: Diagnosis not present

## 2020-03-18 DIAGNOSIS — Z86711 Personal history of pulmonary embolism: Secondary | ICD-10-CM | POA: Diagnosis not present

## 2020-03-18 DIAGNOSIS — Z9221 Personal history of antineoplastic chemotherapy: Secondary | ICD-10-CM | POA: Diagnosis not present

## 2020-03-18 HISTORY — DX: Cough, unspecified: R05.9

## 2020-03-18 HISTORY — DX: Weakness: R53.1

## 2020-03-18 LAB — CMP (CANCER CENTER ONLY)
ALT: 19 U/L (ref 0–44)
AST: 23 U/L (ref 15–41)
Albumin: 2.7 g/dL — ABNORMAL LOW (ref 3.5–5.0)
Alkaline Phosphatase: 113 U/L (ref 38–126)
Anion gap: 10 (ref 5–15)
BUN: 16 mg/dL (ref 8–23)
CO2: 25 mmol/L (ref 22–32)
Calcium: 9.3 mg/dL (ref 8.9–10.3)
Chloride: 101 mmol/L (ref 98–111)
Creatinine: 1.15 mg/dL — ABNORMAL HIGH (ref 0.44–1.00)
GFR, Estimated: 51 mL/min — ABNORMAL LOW (ref >60)
Glucose, Bld: 94 mg/dL (ref 70–99)
Potassium: 4.2 mmol/L (ref 3.5–5.1)
Sodium: 136 mmol/L (ref 135–145)
Total Bilirubin: 0.6 mg/dL (ref 0.3–1.2)
Total Protein: 6.8 g/dL (ref 6.5–8.1)

## 2020-03-18 LAB — CBC WITH DIFFERENTIAL (CANCER CENTER ONLY)
Abs Immature Granulocytes: 0.01 10*3/uL (ref 0.00–0.07)
Basophils Absolute: 0 10*3/uL (ref 0.0–0.1)
Basophils Relative: 0 %
Eosinophils Absolute: 0 10*3/uL (ref 0.0–0.5)
Eosinophils Relative: 0 %
HCT: 37.5 % (ref 36.0–46.0)
Hemoglobin: 12.1 g/dL (ref 12.0–15.0)
Immature Granulocytes: 0 %
Lymphocytes Relative: 6 %
Lymphs Abs: 0.3 10*3/uL — ABNORMAL LOW (ref 0.7–4.0)
MCH: 28.6 pg (ref 26.0–34.0)
MCHC: 32.3 g/dL (ref 30.0–36.0)
MCV: 88.7 fL (ref 80.0–100.0)
Monocytes Absolute: 0.4 10*3/uL (ref 0.1–1.0)
Monocytes Relative: 8 %
Neutro Abs: 3.6 10*3/uL (ref 1.7–7.7)
Neutrophils Relative %: 86 %
Platelet Count: 151 10*3/uL (ref 150–400)
RBC: 4.23 MIL/uL (ref 3.87–5.11)
RDW: 16.3 % — ABNORMAL HIGH (ref 11.5–15.5)
WBC Count: 4.3 10*3/uL (ref 4.0–10.5)
nRBC: 0 % (ref 0.0–0.2)

## 2020-03-18 MED ORDER — METHYLPREDNISOLONE 4 MG PO TBPK: ORAL_TABLET | ORAL | 0 refills | Status: DC

## 2020-03-18 NOTE — Progress Notes (Signed)
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  Molecular Biomarkers:Positive for EGFR mutation  PRIOR THERAPY: Whole brain radiation and radiotherapy to the osseous metastases under the care of Dr. Lisbeth Renshaw. Last treatment expected on 02/02/20  CURRENT THERAPY: Tagrisso160 mg p.o. daily. First dose on9/27/21.  INTERVAL HISTORY: Stefanie Camacho 74 y.o. female returns to the clinic today for follow-up visit accompanied by family member. The patient is here for a symptom management clinic today. The patient states that her main complaint is related to her cough which occurs with deep breathing. The patient states that her cough has progressively worsened since her diagnosis. She recently started taking Delsym over-the-counter which "helps some" her cough. The patient states that she coughs up clear spit. She denies any sick contacts, sore throat, fevers, nasal congestion, or night sweats. She denies any sick contacts. She is up to date on her COVID-19 booster vaccines. The patient has a also gradually worsening shortness of breath also since her diagnosis in September 2021. The patient states that she gets winded if she talks and walks 10 to 12 feet. She is not presently on any home oxygen. She denies any chest pain except for intermittent right rib pain which is sore to palpation. She denies any pain at this time. The patient takes Tylenol 1000 mg once daily for her pain which takes her pain level to be 0 out of 10.   She is scheduled to have a restaging CT scan of the chest, abdomen, and pelvis next week  to restage her disease. The patient is reluctant to drink the oral contrast for the abdominal/pelvis CT scan.   Additionally, the patient is endorsing continued generalized weakness, particularly in the lower extremities bilaterally which makes it challenging to stand up or get out of a chair. The patient believes it would be beneficial for home health to come to the house to offer aid and assistance. The patient has widespread bony metastases. She is followed by Dr. Lorin Mercy from orthopedics. According to his last note, he advised a follow up visit this month but no appointments have been scheduled. She states her hip often feels sore. She is likely also weak secondary to poor oral/fluid intake. She states food does not taste the same and she is reporting a decreased appetite. No evidence of thrush. She drinks ensure but it takes several hours to finish one bottle. She has been evaluated by a member of the nutritional team in the past. She often has dry heaves and takes compazine every 6 hours for nausea.   She follows with Dr. Mickeal Skinner for her brain metastases/leptomeningeal metastases. She presented initially with stroke like symptoms which lead to the diagnosis of innumerable brain metastases and additional foci of leptomeningeal enhancement. She follows Dr. Mickeal Skinner for her neurological deficits related to her metastatic disease. She is scheduled for a repeat brain MRI next month on 04/22/20.  She is here for evaluation, repeat blood work, and recommendations regarding her condition.    MEDICAL HISTORY: Past Medical History:  Diagnosis Date  . Anxiety   . Dyslipidemia   . Essential hypertension   . GERD (gastroesophageal reflux disease)   .  Hypothyroidism   . Left bundle branch block 06/04/2018  . NICM (nonischemic cardiomyopathy) ([facility])   . Nonischemic cardiomyopathy ([facility]) 06/04/2018   Ejection fraction 45% in summer 2019  . Sleep apnea     ALLERGIES:  has No Known Allergies.  MEDICATIONS:   Current Outpatient Medications  Medication Sig Dispense Refill  . aspirin EC 81 MG tablet Take 81 mg by mouth daily.    Marland Kitchen atorvastatin (LIPITOR) 10 MG tablet Take 1 tablet (10 mg total) by mouth daily at 6 PM. 30 tablet 2  . dexamethasone (DECADRON) 4 MG tablet Please take 1 tablet twice a day for 1 week, followed by 1 tablet per day for 1 week, followed by 1/2 a tablet for 1 week 26 tablet 0  . FLUoxetine (PROZAC) 10 MG tablet Take 1 tablet (10 mg total) by mouth daily. 30 tablet 3  . levETIRAcetam (KEPPRA) 500 MG tablet Take 1 tablet (500 mg total) by mouth 2 (two) times daily. 60 tablet 1  . levothyroxine (SYNTHROID) 75 MCG tablet Take 1 tablet (75 mcg total) by mouth daily. 30 tablet 1  . losartan (COZAAR) 50 MG tablet Take 1 tablet (50 mg total) by mouth daily. 90 tablet 2  . methylPREDNISolone (MEDROL DOSEPAK) 4 MG TBPK tablet Use as instructed 21 tablet 0  . metoprolol succinate (TOPROL-XL) 50 MG 24 hr tablet Take 1 tablet (50 mg total) by mouth daily. Take with or immediately following a meal. 90 tablet 2  . osimertinib mesylate (TAGRISSO) 80 MG tablet Take 2 tablets (160 mg total) by mouth daily. Days 1-28. 60 tablet 3  . oxyCODONE-acetaminophen (PERCOCET/ROXICET) 5-325 MG tablet Take 1 tablet by mouth every 8 (eight) hours as needed for severe pain. 45 tablet 0  . pantoprazole (PROTONIX) 40 MG tablet Take 1 tablet (40 mg total) by mouth daily. 30 tablet 1  . prochlorperazine (COMPAZINE) 10 MG tablet Take 1 tablet (10 mg total) by mouth every 6 (six) hours as needed for nausea or vomiting. 45 tablet 5  . spironolactone (ALDACTONE) 25 MG tablet Take 0.5 tablets (12.5 mg total) by mouth daily. 45 tablet 3   No current facility-administered medications for this visit.    SURGICAL HISTORY:  Past Surgical History:  Procedure Laterality Date  . ABDOMINAL HYSTERECTOMY    . BLADDER NECK RECONSTRUCTION    . BRONCHIAL BIOPSY  01/12/2020   Procedure: BRONCHIAL BIOPSIES;  Surgeon: Garner Nash, DO;  Location: [facility];  Service: Pulmonary;;  . BRONCHIAL BRUSHINGS  01/12/2020   Procedure: BRONCHIAL BRUSHINGS;  Surgeon: Garner Nash, DO;  Location: [facility];  Service: Pulmonary;;  . BRONCHIAL NEEDLE ASPIRATION BIOPSY  01/12/2020   Procedure: BRONCHIAL NEEDLE ASPIRATION BIOPSIES;  Surgeon: Garner Nash, DO;  Location: [facility];  Service: Pulmonary;;  . BRONCHIAL WASHINGS  01/12/2020   Procedure: BRONCHIAL WASHINGS;  Surgeon: Garner Nash, DO;  Location: [facility];  Service: Pulmonary;;  . SHOULDER SURGERY    . TONSILLECTOMY    . VIDEO BRONCHOSCOPY WITH ENDOBRONCHIAL ULTRASOUND  01/12/2020   Procedure: VIDEO BRONCHOSCOPY WITH ENDOBRONCHIAL ULTRASOUND;  Surgeon: Garner Nash, DO;  Location: [facility];  Service: Pulmonary;;    REVIEW OF SYSTEMS:   Review of Systems  Constitutional: Positive for fatigue, generalized weakness, decreased appetite, weight loss.  Negative for chills and fever. HENT: Negative for mouth sores, nosebleeds, sore throat and trouble swallowing.   Eyes: Negative for eye problems and icterus.  Respiratory: Positive for shortness of breath and cough. Negative  for hemoptysis and wheezing.   Cardiovascular: Positive for chest discomfort in her right lateral rib cage intermittently. Negative for leg swelling.  Gastrointestinal: Positive for nausea. Negative for abdominal pain, constipation, and vomiting.  Genitourinary: Negative for bladder incontinence, difficulty urinating, dysuria, frequency and hematuria.   Musculoskeletal: Negative for back pain, gait problem, neck pain and neck stiffness.  Skin: Positive for dry scaly scalp Neurological: Positive for generalized weakness. Negative for dizziness, extremity weakness, gait problem, headaches, light-headedness and seizures.  Hematological: Negative for adenopathy. Does not bruise/bleed easily.  Psychiatric/Behavioral: Negative for confusion, depression and sleep disturbance. The  patient is not nervous/anxious.     PHYSICAL EXAMINATION:  Blood pressure (!) 123/102, pulse 79, temperature (!) 96.1 F (35.6 C), temperature source Tympanic, resp. rate 17, height '5\' 6"'  (1.676 m), weight 179 lb 6.4 oz (81.4 kg), SpO2 95 %.  ECOG PERFORMANCE STATUS: 3 - Symptomatic, >50% confined to bed  Physical Exam  Constitutional: Oriented to person, place, and time and chornically ill appearing female and in no distress.  HENT:  Head: Normocephalic and atraumatic.  Mouth/Throat: Oropharynx is clear and moist. No oropharyngeal exudate.  Eyes: Conjunctivae are normal. Right eye exhibits no discharge. Left eye exhibits no discharge. No scleral icterus.  Neck: Normal range of motion. Neck supple.  Cardiovascular: Normal rate, regular rhythm, normal heart sounds and intact distal pulses.  Pulmonary/Chest: Effort normal and breath sounds normal. No respiratory distress. No wheezes. No rales.  Abdominal: Soft. Bowel sounds are normal. Exhibits no distension and no mass.Some tenderness in umbilical area.  Musculoskeletal: Normal range of motion. Exhibits no edema.  Lymphadenopathy:  No cervical adenopathy.  Neurological: Alert and oriented to person, place, and time. Exhibits muscle wasting.  The patient was examined in the wheelchair.   Skin: Skin is warm and dry. Dry scaly scalp.  Psychiatric: Mood, memory and judgment normal.  Vitals reviewed.  LABORATORY DATA: Lab Results  Component Value Date   WBC 4.3 03/18/2020   HGB 12.1 03/18/2020   HCT 37.5 03/18/2020   MCV 88.7 03/18/2020   PLT 151 03/18/2020      Chemistry      Component Value Date/Time   NA 136 03/18/2020 1026   K 4.2 03/18/2020 1026   CL 101 03/18/2020 1026   CO2 25 03/18/2020 1026   BUN 16 03/18/2020 1026   CREATININE 1.15 (H) 03/18/2020 1026      Component Value Date/Time   CALCIUM 9.3 03/18/2020 1026   ALKPHOS 113 03/18/2020 1026   AST 23 03/18/2020 1026   ALT 19 03/18/2020 1026   BILITOT 0.6  03/18/2020 1026       RADIOGRAPHIC STUDIES:  No results found.   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  The patientcompletedwhole brain radiation as well a palliative radiotherapy to the osseous metastasesand primary lung lesionunder the care of Dr. Lisbeth Renshaw. Her last treatmentwason 02/02/2020.  The patient is currently undergoingoral chemotherapy with Tagrisso 160 mg p.o. daily. Her first dosewas on 02/01/20.  The patient was seen with Dr. Julien Nordmann today.  Dr. Earlie Server had a lengthy discussion with the patient about her current condition and disease.  Dr. Earlie Server discussed the importance of her restaging CT scan of the chest, abdomen, and pelvis to assess for treatment response and to rule out rare side effects of Tagrisso including interstitial pneumonitis.  Discussed the purpose of drinking the oral contrast to help the radiologist with visualization of her scan.  The patient is going to try to drink the water down oral contrast at the radiology department to the best of her ability.   Regarding the patient's cough, the patient will continue to use Delsym.  Additionally, I have sent a Medrol Dosepak to the patient's pharmacy as recommended by Dr. Earlie Server.  Additionally, we will assess the patient's lung for treatment response and etiology of her persistent shortness of breath/cough on her upcoming imaging studies.  For the patient's shortness of breath, she was 95% on 1 L of oxygen via nasal cannula.  We assessed the patient with a walking test today without oxygen and  she desatted to 85%.  We will arrange for the patient to receive home oxygen.  Regarding the patient's generalized weakness and poor performance status, the patient and her daughter feel that she would benefit from a referral to home health for assistance to help with activities of daily living.  We will fax over our office note today to help arrange that. We will arrange for the patient to receive IVF tomorrow in the clinic for her poor oral intake.   She will see Dr. Mickeal Skinner today for further evaluation regarding her neurological deficits today.   We will see the patient back for follow-up visit on 03/29/2020 for follow-up visit in 2 review her restaging CT scan.   The patient was advised to call immediately if she has any concerning symptoms in the interval. The patient voices understanding of current disease status and treatment options and is in agreement with the current care plan. All questions were answered. The patient knows to call the clinic with any problems, questions or concerns. We can certainly see the patient much sooner if necessary  No orders of the defined types were placed in this encounter.    Stefanie Trettin L Breuna Loveall, PA-C 03/18/20  ADDENDUM: Hematology/Oncology Attending: I had a face-to-face encounter with the patient today.  I recommended her care plan.  This is a very pleasant 74 years old white female diagnosed with stage IV non-small cell lung cancer, adenocarcinoma with positive EGFR mutation with deletion in exon 19.  The patient presented with left lower lobe lung mass in addition to extensive bilateral pulmonary metastasis and mediastinal lymphadenopathy as well as a skeletal metastasis and disease metastasis to brain with leptomeningeal disease.  She is status post whole brain irradiation.  Targeted therapy with Tagrisso 160 mg p.o. twice daily status post 7 weeks of treatment and has been tolerating the treatment well except for the persistent cough, shortness  of breath at baseline increased with exertion as well as generalized fatigue and weakness.  The patient also has lack of appetite and weight loss. I will arrange for the patient to receive IV hydration with normal saline and the infusion room tomorrow. We will start the patient on Medrol Dosepak to help with her appetite and also with the shortness of breath. We will arrange for the patient to have advanced home care to help with her needs and symptoms at home. The patient will come back for follow-up visit in around 10 days with repeat CT scan of the chest, abdomen pelvis for restaging of her disease. For the intermittent confusion and  the history of leptomeningeal disease, the patient is scheduled to see Dr. Mickeal Skinner later today for evaluation and recommendation. She was advised to call immediately if she has any other concerning symptoms in the interval.  Disclaimer: This note was dictated with voice recognition software. Similar sounding words can inadvertently be transcribed and may be missed upon review. Eilleen Kempf, MD 03/18/20

## 2020-03-18 NOTE — Progress Notes (Signed)
[facility] [place], [place] [address] [phone]   Interval Evaluation  Date of Service: 03/18/20 Patient Name: Stefanie Camacho Patient MRN: [id number] Patient DOB: 03-07-1946 Provider: Ventura Sellers, MD  Identifying Statement:  Stefanie Camacho is a 73 y.o. 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    Referring Provider: [name], [address] [place],  [place] [address]  Primary Cancer:  Oncologic History: Oncology History  Adenocarcinoma of left lung, stage 4 ([place])  01/18/2020 Initial Diagnosis   Adenocarcinoma of left lung, stage 4 ([place])   01/28/2020 -  Chemotherapy   The patient had [No matching medication found in this treatment plan]  for chemotherapy treatment.      Interval History:  Stefanie Camacho presents today for follow up after describing clinical changes to Dr. Worthy Flank team.  She and her daughter describe relatively new onset of slurred speech, which only occurs "at the end of a tiring day".  Day to day, her speech is normal.  Also has some vertigo when sitting up, this resolves on its own and even predates cancer diagnosis by at least a year.  Continues to otherwise maintain functional status, gait is limited by generalized weakess and chronic fatigue.  No recurrence of seizures on Keppra 524m BID. [place]    H+P (01/29/20) Patient presented to medical attention on 01/09/20 with sudden onset "marching" left sided facial numbness.  The episode drew suspicion for stroke, and during workup patient was found to have innumerable brain metastases with additional foci of leptomeningeal enhancement secondary to widely metastatic lung cancer.  She returned to baseline and was discharged with [place] brain disease has since been treated with whole brain  radiotherapy with Dr. MLisbeth Renshaw she is scheduled to complete treatment next week.  She has difficulty with ambulation because of a hip metastasis, but since being treated with RT that pain has improved as has her walking.  Plans to meet with Dr. MJulien Nordmannnext week to discuss plans for therapeutic targeting of EGFR mutation.    Medications: Current Outpatient Medications on File Prior to Visit  Medication Sig Dispense Refill  . aspirin EC 81 MG tablet Take 81 mg by mouth daily.    .Marland Kitchenatorvastatin (LIPITOR) 10 MG tablet Take 1 tablet (10 mg total) by mouth daily at 6 PM. 30 tablet 2  . dexamethasone (DECADRON) 4 MG tablet Please take 1 tablet twice a day for 1 week, followed by 1 tablet per day for 1 week, followed by 1/2 a tablet for 1 week 26 tablet 0  . FLUoxetine (PROZAC) 10 MG tablet Take 1 tablet (10 mg total) by mouth daily. 30 tablet 3  . levETIRAcetam (KEPPRA) 500 MG tablet Take 1 tablet (500 mg total) by mouth 2 (two) times daily. 60 tablet 1  . levothyroxine (SYNTHROID) 75 MCG tablet Take 1 tablet (75 mcg total) by mouth daily. 30 tablet 1  . losartan (COZAAR) 50 MG tablet Take 1 tablet (50 mg total) by mouth daily. 90 tablet 2  . metoprolol succinate (TOPROL-XL) 50 MG 24 hr tablet Take 1 tablet (50 mg total) by mouth daily. Take with or immediately following a meal. 90 tablet 2  . osimertinib mesylate (TAGRISSO) 80 MG tablet Take 2 tablets (160 mg total) by mouth daily. Days 1-28. 60 tablet 3  . oxyCODONE-acetaminophen (PERCOCET/ROXICET) 5-325 MG tablet  Take 1 tablet by mouth every 8 (eight) hours as needed for severe pain. 45 tablet 0  . pantoprazole (PROTONIX) 40 MG tablet Take 1 tablet (40 mg total) by mouth daily. 30 tablet 1  . prochlorperazine (COMPAZINE) 10 MG tablet Take 1 tablet (10 mg total) by mouth every 6 (six) hours as needed for nausea or vomiting. 45 tablet 5  . spironolactone (ALDACTONE) 25 MG tablet Take 0.5 tablets (12.5 mg total) by mouth daily. 45 tablet 3   No current  facility-administered medications on file prior to visit.    Allergies: No Known Allergies Past Medical History:  Past Medical History:  Diagnosis Date  . Anxiety   . Dyslipidemia   . Essential hypertension   . GERD (gastroesophageal reflux disease)   . Hypothyroidism   . Left bundle branch block 06/04/2018  . NICM (nonischemic cardiomyopathy) ([place])   . Nonischemic cardiomyopathy ([place]) 06/04/2018   Ejection fraction 45% in summer 2019  . Sleep apnea    Past Surgical History:  Past Surgical History:  Procedure Laterality Date  . ABDOMINAL HYSTERECTOMY    . BLADDER NECK RECONSTRUCTION    . BRONCHIAL BIOPSY  01/12/2020   Procedure: BRONCHIAL BIOPSIES;  Surgeon: Garner Nash, DO;  Location: [facility];  Service: Pulmonary;;  . BRONCHIAL BRUSHINGS  01/12/2020   Procedure: BRONCHIAL BRUSHINGS;  Surgeon: Garner Nash, DO;  Location: [facility];  Service: Pulmonary;;  . BRONCHIAL NEEDLE ASPIRATION BIOPSY  01/12/2020   Procedure: BRONCHIAL NEEDLE ASPIRATION BIOPSIES;  Surgeon: Garner Nash, DO;  Location: [facility];  Service: Pulmonary;;  . BRONCHIAL WASHINGS  01/12/2020   Procedure: BRONCHIAL WASHINGS;  Surgeon: Garner Nash, DO;  Location: [facility];  Service: Pulmonary;;  . SHOULDER SURGERY    . TONSILLECTOMY    . VIDEO BRONCHOSCOPY WITH ENDOBRONCHIAL ULTRASOUND  01/12/2020   Procedure: VIDEO BRONCHOSCOPY WITH ENDOBRONCHIAL ULTRASOUND;  Surgeon: Garner Nash, DO;  Location: [facility];  Service: Pulmonary;;   Social History:  Social History   Socioeconomic History  . Marital status: Divorced    Spouse name: Not on file  . Number of children: Not on file  . Years of education: Not on file  . Highest education level: Not on file  Occupational History  . Not on file  Tobacco Use  . Smoking status: Never Smoker  . Smokeless tobacco: Never Used  Vaping Use  . Vaping Use: Never used  Substance and Sexual Activity  . Alcohol use: Not Currently  . Drug use:  Never  . Sexual activity: Not on file  Other Topics Concern  . Not on file  Social History Narrative  . Not on file   Social Determinants of Health   Financial Resource Strain:   . Difficulty of Paying Living Expenses: Not on file  Food Insecurity:   . Worried About Charity fundraiser in the Last Year: Not on file  . Ran Out of Food in the Last Year: Not on file  Transportation Needs:   . Lack of Transportation (Medical): Not on file  . Lack of Transportation (Non-Medical): Not on file  Physical Activity:   . Days of Exercise per Week: Not on file  . Minutes of Exercise per Session: Not on file  Stress:   . Feeling of Stress : Not on file  Social Connections:   . Frequency of Communication with Friends and Family: Not on file  . Frequency of Social Gatherings with Friends and Family: Not on file  .  Attends Religious Services: Not on file  . Active Member of Clubs or Organizations: Not on file  . Attends Archivist Meetings: Not on file  . Marital Status: Not on file  Intimate Partner Violence:   . Fear of Current or Ex-Partner: Not on file  . Emotionally Abused: Not on file  . Physically Abused: Not on file  . Sexually Abused: Not on file   Family History:  Family History  Problem Relation Age of Onset  . Heart disease Mother   . Stroke Mother   . Heart disease Father   . Stroke Father   . Heart disease Maternal Grandfather   . Heart disease Paternal Grandfather     Review of Systems: Constitutional: Doesn't report fevers, chills or abnormal weight loss Eyes: Doesn't report blurriness of vision Ears, nose, mouth, throat, and face: Doesn't report sore throat Respiratory: Doesn't report cough, dyspnea or wheezes Cardiovascular: Doesn't report palpitation, chest discomfort  Gastrointestinal:  Doesn't report nausea, constipation, diarrhea GU: Doesn't report incontinence Skin: Doesn't report skin rashes Neurological: Per HPI Musculoskeletal: Doesn't  report joint pain Behavioral/Psych: Doesn't report anxiety  Physical Exam: Vitals with BMI 03/18/2020 03/08/2020 02/29/2020  Height _0  - _1   Weight 179 lbs 6 oz 182 lbs 3 oz 183 lbs 1 oz  BMI 28.97 09.60 45.40  Systolic 981 - 191  Diastolic 478 - 72  Pulse 79 - 65   KPS: 80. General: Alert, cooperative, pleasant, in no acute distress Head: Normal EENT: No conjunctival injection or scleral icterus.  Lungs: Resp effort normal Cardiac: Regular rate Abdomen: Non-distended abdomen Skin: No rashes cyanosis or petechiae. Extremities: No clubbing or edema  Neurologic Exam: Mental Status: Awake, alert, attentive to examiner. Oriented to self and environment. Language is fluent with intact comprehension.  Cranial Nerves: Visual acuity is grossly normal. Visual fields are full. Extra-ocular movements intact. No ptosis. Face is symmetric Motor: Tone and bulk are normal. Power is full in both arms and legs. Reflexes are symmetric, no pathologic reflexes present.  Sensory: Intact to light touch Gait: Orthopedic limitation  Labs: I have reviewed the data as listed    Component Value Date/Time   NA 138 02/25/2020 1441   K 4.1 02/25/2020 1441   CL 106 02/25/2020 1441   CO2 24 02/25/2020 1441   GLUCOSE 97 02/25/2020 1441   BUN 18 02/25/2020 1441   CREATININE 1.06 (H) 02/25/2020 1441   CALCIUM 9.3 02/25/2020 1441   PROT 6.3 (L) 02/25/2020 1441   PROT 6.9 07/17/2018 0934   ALBUMIN 3.1 (L) 02/25/2020 1441   ALBUMIN 4.1 07/17/2018 0934   AST 14 (L) 02/25/2020 1441   ALT 17 02/25/2020 1441   ALKPHOS 188 (H) 02/25/2020 1441   BILITOT 0.5 02/25/2020 1441   GFRNONAA 56 (L) 02/25/2020 1441   GFRAA >60 02/01/2020 1419   Lab Results  Component Value Date   WBC 4.3 03/18/2020   NEUTROABS 3.6 03/18/2020   HGB 12.1 03/18/2020   HCT 37.5 03/18/2020   MCV 88.7 03/18/2020   PLT 151 03/18/2020     Assessment/Plan Brain Metastases Leptomeningeal Metastases  Stefanie Camacho is  clinically stable today, now having completed whole brain radiation.  We are not concerned by her recent reports of transient dysarthria, given lack of symptoms during the day and association with fatigue.  She may experience some modest neurologic decompensation with physical and emotional stressors.   Because of fatigue issues we recommended trial of lower dose Keppra 263m BID.  No  recent seizure events.  We appreciate the opportunity to participate in the care of Stefanie Camacho.  She will return to clinic next month following next brain MRI, or sooner as needed.  All questions were answered. The patient knows to call the clinic with any problems, questions or concerns. No barriers to learning were detected.  The total time spent in the encounter was 30 minutes and more than 50% was on counseling and review of test results   Ventura Sellers, MD Medical Director of Neuro-Oncology [facility] 03/18/20 10:46 AM

## 2020-03-19 ENCOUNTER — Inpatient Hospital Stay: Payer: Medicare HMO

## 2020-03-19 DIAGNOSIS — C3432 Malignant neoplasm of lower lobe, left bronchus or lung: Secondary | ICD-10-CM | POA: Diagnosis not present

## 2020-03-19 DIAGNOSIS — R630 Anorexia: Secondary | ICD-10-CM

## 2020-03-19 MED ORDER — SODIUM CHLORIDE 0.9 % IV SOLN
Freq: Once | INTRAVENOUS | Status: AC
  Filled 2020-03-19: qty 250

## 2020-03-19 NOTE — Progress Notes (Signed)
Pt discharged in no apparent distress. Pt left via wheelchair with daughter.  Pt aware of discharge instructions and verbalized understanding and had no further questions.

## 2020-03-19 NOTE — Patient Instructions (Signed)

## 2020-03-21 ENCOUNTER — Telehealth: Payer: Self-pay | Admitting: *Deleted

## 2020-03-21 NOTE — Telephone Encounter (Signed)
Received message from [facility] regarding referral & was informed that they don't have staff to handle this referral.

## 2020-03-21 NOTE — Telephone Encounter (Deleted)
Called & spoke to daughter & informed of [facility] Website to try for pt assistance.

## 2020-03-21 NOTE — Telephone Encounter (Signed)
Disregard previous note.  Error.

## 2020-03-23 ENCOUNTER — Telehealth: Payer: Self-pay | Admitting: Internal Medicine

## 2020-03-23 ENCOUNTER — Inpatient Hospital Stay (HOSPITAL_BASED_OUTPATIENT_CLINIC_OR_DEPARTMENT_OTHER): Payer: Medicare HMO

## 2020-03-23 ENCOUNTER — Other Ambulatory Visit: Payer: Self-pay

## 2020-03-23 ENCOUNTER — Telehealth: Payer: Self-pay

## 2020-03-23 VITALS — BP 125/73 | Ht 65.0 in | Wt 132.6 lb

## 2020-03-23 DIAGNOSIS — C3492 Malignant neoplasm of unspecified part of left bronchus or lung: Secondary | ICD-10-CM

## 2020-03-23 NOTE — Telephone Encounter (Signed)
Scheduled appt per 11/17 sch msg - left message for patient with appt date and time

## 2020-03-23 NOTE — Progress Notes (Signed)
SATURATION QUALIFICATIONS: (This note is used to comply with regulatory documentation for home oxygen)  Patient Saturations on Room Air at Rest = 94%  Patient Saturations on Room Air while Ambulating = 87%  Patient Saturations on 2 Liters of oxygen while Ambulating = 91%  Please briefly explain why patient needs home oxygen: No other means available to increase her oxygen level.

## 2020-03-23 NOTE — Telephone Encounter (Signed)
Pt sister, [name], [place] indicating the pts O2 saturation has been in the low 80's in a sitting position.  I spoke with pt sister and advised pt needs to go to the ED since that's a significant decline in her O2 (she was 95% while in the office in a sitting position 03/18/20 upon recheck). [name] advised the pt is not going to want to go to the ED. I expressed because of such a significant decline in her O2, this is the recommendation as the pt may have another health issue occurring.  Pt sister text the pt while on the phone with me and she advised the pt is saying "as best she can tell" that her O2 is now in the upper 80's with a single incident of pts O2 being 83% over the weekend. She also advised since receiving IV fluids her appetite has been better and she feels better overall. She wants to know if the pt can have IV fluids regularly. [name] also advises the pts cough has also improved.  Pt has been scheduled for O2 testing today and possible IV fluids.

## 2020-03-23 NOTE — Telephone Encounter (Addendum)
Stefanie Camacho is approved [place] agency. Demographics /records faxed to [facility].  Oxygen order faxed to [place].

## 2020-03-24 ENCOUNTER — Other Ambulatory Visit: Payer: Medicare HMO

## 2020-03-25 ENCOUNTER — Telehealth: Payer: Self-pay | Admitting: Physician Assistant

## 2020-03-25 ENCOUNTER — Emergency Department (HOSPITAL_BASED_OUTPATIENT_CLINIC_OR_DEPARTMENT_OTHER): Payer: Medicare HMO

## 2020-03-25 ENCOUNTER — Other Ambulatory Visit: Payer: Self-pay

## 2020-03-25 ENCOUNTER — Encounter (HOSPITAL_COMMUNITY): Payer: Self-pay

## 2020-03-25 ENCOUNTER — Ambulatory Visit (HOSPITAL_COMMUNITY)
Admission: RE | Admit: 2020-03-25 | Discharge: 2020-03-25 | Disposition: A | Discharge status: Home / Self Care | Payer: Medicare HMO | Source: Ambulatory Visit | Attending: Physician Assistant | Admitting: Physician Assistant

## 2020-03-25 ENCOUNTER — Observation Stay (HOSPITAL_COMMUNITY)
Admission: EM | Admit: 2020-03-25 | Discharge: 2020-03-27 | Disposition: A | Discharge status: Home / Self Care | Payer: Medicare HMO | Attending: Internal Medicine | Admitting: Internal Medicine

## 2020-03-25 ENCOUNTER — Other Ambulatory Visit: Payer: Self-pay | Admitting: Physician Assistant

## 2020-03-25 DIAGNOSIS — I428 Other cardiomyopathies: Secondary | ICD-10-CM

## 2020-03-25 DIAGNOSIS — C7931 Secondary malignant neoplasm of brain: Secondary | ICD-10-CM | POA: Diagnosis not present

## 2020-03-25 DIAGNOSIS — I2693 Single subsegmental pulmonary embolism without acute cor pulmonale: Secondary | ICD-10-CM | POA: Diagnosis present

## 2020-03-25 DIAGNOSIS — I2699 Other pulmonary embolism without acute cor pulmonale: Principal | ICD-10-CM | POA: Insufficient documentation

## 2020-03-25 DIAGNOSIS — K219 Gastro-esophageal reflux disease without esophagitis: Secondary | ICD-10-CM | POA: Diagnosis present

## 2020-03-25 DIAGNOSIS — K7689 Other specified diseases of liver: Secondary | ICD-10-CM | POA: Diagnosis not present

## 2020-03-25 DIAGNOSIS — M7989 Other specified soft tissue disorders: Secondary | ICD-10-CM | POA: Diagnosis not present

## 2020-03-25 DIAGNOSIS — Z8673 Personal history of transient ischemic attack (TIA), and cerebral infarction without residual deficits: Secondary | ICD-10-CM | POA: Insufficient documentation

## 2020-03-25 DIAGNOSIS — C7800 Secondary malignant neoplasm of unspecified lung: Secondary | ICD-10-CM

## 2020-03-25 DIAGNOSIS — D696 Thrombocytopenia, unspecified: Secondary | ICD-10-CM | POA: Diagnosis not present

## 2020-03-25 DIAGNOSIS — Z7982 Long term (current) use of aspirin: Secondary | ICD-10-CM | POA: Diagnosis not present

## 2020-03-25 DIAGNOSIS — C7949 Secondary malignant neoplasm of other parts of nervous system: Secondary | ICD-10-CM | POA: Diagnosis not present

## 2020-03-25 DIAGNOSIS — F419 Anxiety disorder, unspecified: Secondary | ICD-10-CM | POA: Diagnosis present

## 2020-03-25 DIAGNOSIS — Z79899 Other long term (current) drug therapy: Secondary | ICD-10-CM | POA: Diagnosis not present

## 2020-03-25 DIAGNOSIS — C3492 Malignant neoplasm of unspecified part of left bronchus or lung: Secondary | ICD-10-CM

## 2020-03-25 DIAGNOSIS — E871 Hypo-osmolality and hyponatremia: Secondary | ICD-10-CM | POA: Diagnosis not present

## 2020-03-25 DIAGNOSIS — Z20822 Contact with and (suspected) exposure to covid-19: Secondary | ICD-10-CM | POA: Diagnosis not present

## 2020-03-25 DIAGNOSIS — R52 Pain, unspecified: Secondary | ICD-10-CM | POA: Diagnosis not present

## 2020-03-25 DIAGNOSIS — E039 Hypothyroidism, unspecified: Secondary | ICD-10-CM | POA: Diagnosis not present

## 2020-03-25 DIAGNOSIS — N281 Cyst of kidney, acquired: Secondary | ICD-10-CM | POA: Diagnosis not present

## 2020-03-25 DIAGNOSIS — C7951 Secondary malignant neoplasm of bone: Secondary | ICD-10-CM | POA: Diagnosis not present

## 2020-03-25 DIAGNOSIS — E785 Hyperlipidemia, unspecified: Secondary | ICD-10-CM | POA: Insufficient documentation

## 2020-03-25 DIAGNOSIS — I10 Essential (primary) hypertension: Secondary | ICD-10-CM | POA: Diagnosis present

## 2020-03-25 DIAGNOSIS — I429 Cardiomyopathy, unspecified: Secondary | ICD-10-CM | POA: Diagnosis not present

## 2020-03-25 DIAGNOSIS — D1803 Hemangioma of intra-abdominal structures: Secondary | ICD-10-CM | POA: Diagnosis not present

## 2020-03-25 DIAGNOSIS — I82402 Acute embolism and thrombosis of unspecified deep veins of left lower extremity: Secondary | ICD-10-CM | POA: Diagnosis not present

## 2020-03-25 DIAGNOSIS — R0602 Shortness of breath: Secondary | ICD-10-CM

## 2020-03-25 DIAGNOSIS — C3432 Malignant neoplasm of lower lobe, left bronchus or lung: Secondary | ICD-10-CM | POA: Diagnosis present

## 2020-03-25 DIAGNOSIS — Z7901 Long term (current) use of anticoagulants: Secondary | ICD-10-CM | POA: Insufficient documentation

## 2020-03-25 DIAGNOSIS — C7932 Secondary malignant neoplasm of cerebral meninges: Secondary | ICD-10-CM | POA: Diagnosis present

## 2020-03-25 DIAGNOSIS — J929 Pleural plaque without asbestos: Secondary | ICD-10-CM | POA: Diagnosis not present

## 2020-03-25 DIAGNOSIS — I5043 Acute on chronic combined systolic (congestive) and diastolic (congestive) heart failure: Secondary | ICD-10-CM

## 2020-03-25 DIAGNOSIS — C3490 Malignant neoplasm of unspecified part of unspecified bronchus or lung: Secondary | ICD-10-CM | POA: Diagnosis not present

## 2020-03-25 HISTORY — DX: Single subsegmental thrombotic pulmonary embolism without acute cor pulmonale: I26.93

## 2020-03-25 LAB — ECHOCARDIOGRAM COMPLETE
Area-P 1/2: 2.48 cm2
MV M vel: 5.43 m/s
MV Peak grad: 117.9 mmHg
S' Lateral: 3.7 cm

## 2020-03-25 LAB — COMPREHENSIVE METABOLIC PANEL
ALT: 23 U/L (ref 0–44)
AST: 20 U/L (ref 15–41)
Albumin: 3.2 g/dL — ABNORMAL LOW (ref 3.5–5.0)
Alkaline Phosphatase: 96 U/L (ref 38–126)
Anion gap: 9 (ref 5–15)
BUN: 18 mg/dL (ref 8–23)
CO2: 26 mmol/L (ref 22–32)
Calcium: 8.8 mg/dL — ABNORMAL LOW (ref 8.9–10.3)
Chloride: 99 mmol/L (ref 98–111)
Creatinine, Ser: 1.06 mg/dL — ABNORMAL HIGH (ref 0.44–1.00)
GFR, Estimated: 56 mL/min — ABNORMAL LOW (ref >60)
Glucose, Bld: 90 mg/dL (ref 70–99)
Potassium: 4.1 mmol/L (ref 3.5–5.1)
Sodium: 134 mmol/L — ABNORMAL LOW (ref 135–145)
Total Bilirubin: 0.7 mg/dL (ref 0.3–1.2)
Total Protein: 6.2 g/dL — ABNORMAL LOW (ref 6.5–8.1)

## 2020-03-25 LAB — CBC WITH DIFFERENTIAL/PLATELET
Abs Immature Granulocytes: 0.04 10*3/uL (ref 0.00–0.07)
Basophils Absolute: 0 10*3/uL (ref 0.0–0.1)
Basophils Relative: 0 %
Eosinophils Absolute: 0 10*3/uL (ref 0.0–0.5)
Eosinophils Relative: 0 %
HCT: 39.7 % (ref 36.0–46.0)
Hemoglobin: 12.6 g/dL (ref 12.0–15.0)
Immature Granulocytes: 1 %
Lymphocytes Relative: 6 %
Lymphs Abs: 0.3 10*3/uL — ABNORMAL LOW (ref 0.7–4.0)
MCH: 28.8 pg (ref 26.0–34.0)
MCHC: 31.7 g/dL (ref 30.0–36.0)
MCV: 90.6 fL (ref 80.0–100.0)
Monocytes Absolute: 0.4 10*3/uL (ref 0.1–1.0)
Monocytes Relative: 8 %
Neutro Abs: 4.4 10*3/uL (ref 1.7–7.7)
Neutrophils Relative %: 85 %
Platelets: 146 10*3/uL — ABNORMAL LOW (ref 150–400)
RBC: 4.38 MIL/uL (ref 3.87–5.11)
RDW: 16.9 % — ABNORMAL HIGH (ref 11.5–15.5)
WBC: 5.2 10*3/uL (ref 4.0–10.5)
nRBC: 0 % (ref 0.0–0.2)

## 2020-03-25 LAB — BLOOD GAS, ARTERIAL
Acid-Base Excess: 0.3 mmol/L (ref 0.0–2.0)
Bicarbonate: 24 mmol/L (ref 20.0–28.0)
Drawn by: 270211
O2 Saturation: 86.8 %
Patient temperature: 98.6
pCO2 arterial: 37.7 mmHg (ref 32.0–48.0)
pH, Arterial: 7.42 (ref 7.350–7.450)
pO2, Arterial: 52.9 mmHg — ABNORMAL LOW (ref 83.0–108.0)

## 2020-03-25 LAB — PROTIME-INR
INR: 1.2 (ref 0.8–1.2)
Prothrombin Time: 14.4 seconds (ref 11.4–15.2)

## 2020-03-25 LAB — TROPONIN I (HIGH SENSITIVITY)
Troponin I (High Sensitivity): <2 ng/L (ref <18)
Troponin I (High Sensitivity): 5 ng/L (ref <18)

## 2020-03-25 LAB — URINALYSIS, ROUTINE W REFLEX MICROSCOPIC
Bilirubin Urine: NEGATIVE
Glucose, UA: NEGATIVE mg/dL
Hgb urine dipstick: NEGATIVE
Ketones, ur: NEGATIVE mg/dL
Leukocytes,Ua: NEGATIVE
Nitrite: NEGATIVE
Protein, ur: NEGATIVE mg/dL
Specific Gravity, Urine: 1.028 (ref 1.005–1.030)
pH: 6 (ref 5.0–8.0)

## 2020-03-25 LAB — LACTIC ACID, PLASMA
Lactic Acid, Venous: 1 mmol/L (ref 0.5–1.9)
Lactic Acid, Venous: 1.8 mmol/L (ref 0.5–1.9)

## 2020-03-25 LAB — RESP PANEL BY RT-PCR (FLU A&B, COVID) ARPGX2
Influenza A by PCR: NEGATIVE
Influenza B by PCR: NEGATIVE
SARS Coronavirus 2 by RT PCR: NEGATIVE

## 2020-03-25 LAB — LIPASE, BLOOD: Lipase: 58 U/L — ABNORMAL HIGH (ref 11–51)

## 2020-03-25 MED ORDER — OXYCODONE HCL 5 MG PO TABS: 5.0000 mg | ORAL_TABLET | ORAL | Status: DC | PRN

## 2020-03-25 MED ORDER — PROCHLORPERAZINE MALEATE 10 MG PO TABS
10.0000 mg | ORAL_TABLET | Freq: Four times a day (QID) | ORAL | Status: DC | PRN
  Administered 2020-03-25 – 2020-03-26 (×3): 10 mg via ORAL
  Filled 2020-03-25 (×3): qty 1

## 2020-03-25 MED ORDER — ACETAMINOPHEN 650 MG RE SUPP: 650.0000 mg | Freq: Four times a day (QID) | RECTAL | Status: DC | PRN

## 2020-03-25 MED ORDER — LEVETIRACETAM 500 MG PO TABS
500.0000 mg | ORAL_TABLET | Freq: Two times a day (BID) | ORAL | Status: DC
  Administered 2020-03-25 – 2020-03-27 (×5): 500 mg via ORAL
  Filled 2020-03-25 (×5): qty 1

## 2020-03-25 MED ORDER — IOHEXOL 9 MG/ML PO SOLN: 1000.0000 mL | ORAL | Status: DC

## 2020-03-25 MED ORDER — FLUOXETINE HCL 10 MG PO CAPS
10.0000 mg | ORAL_CAPSULE | Freq: Every day | ORAL | Status: DC
  Administered 2020-03-25 – 2020-03-27 (×3): 10 mg via ORAL
  Filled 2020-03-25 (×3): qty 1

## 2020-03-25 MED ORDER — OSIMERTINIB MESYLATE 80 MG PO TABS
160.0000 mg | ORAL_TABLET | Freq: Every day | ORAL | Status: DC
  Administered 2020-03-25 – 2020-03-26 (×2): 160 mg via ORAL
  Filled 2020-03-25: qty 2

## 2020-03-25 MED ORDER — IOHEXOL 9 MG/ML PO SOLN: 1000.0000 mL | ORAL | Status: AC

## 2020-03-25 MED ORDER — PANTOPRAZOLE SODIUM 40 MG PO TBEC
40.0000 mg | DELAYED_RELEASE_TABLET | Freq: Every day | ORAL | Status: DC
  Administered 2020-03-25 – 2020-03-27 (×3): 40 mg via ORAL
  Filled 2020-03-25 (×4): qty 1

## 2020-03-25 MED ORDER — LOPERAMIDE HCL 2 MG PO CAPS: 2.0000 mg | ORAL_CAPSULE | ORAL | Status: DC | PRN

## 2020-03-25 MED ORDER — IOHEXOL 300 MG/ML  SOLN
100.0000 mL | Freq: Once | INTRAMUSCULAR | Status: AC | PRN
  Administered 2020-03-25: 100 mL via INTRAVENOUS

## 2020-03-25 MED ORDER — ATORVASTATIN CALCIUM 10 MG PO TABS
10.0000 mg | ORAL_TABLET | Freq: Every day | ORAL | Status: DC
  Administered 2020-03-25 – 2020-03-26 (×2): 10 mg via ORAL
  Filled 2020-03-25: qty 1

## 2020-03-25 MED ORDER — APIXABAN 5 MG PO TABS
10.0000 mg | ORAL_TABLET | Freq: Two times a day (BID) | ORAL | Status: DC
  Administered 2020-03-25 – 2020-03-27 (×5): 10 mg via ORAL
  Filled 2020-03-25 (×5): qty 2

## 2020-03-25 MED ORDER — APIXABAN 5 MG PO TABS: 5.0000 mg | ORAL_TABLET | Freq: Two times a day (BID) | ORAL | Status: DC

## 2020-03-25 MED ORDER — SODIUM CHLORIDE 0.9 % IV SOLN: Freq: Once | INTRAVENOUS | Status: AC

## 2020-03-25 MED ORDER — LEVOTHYROXINE SODIUM 50 MCG PO TABS
75.0000 ug | ORAL_TABLET | Freq: Every day | ORAL | Status: DC
  Administered 2020-03-25 – 2020-03-27 (×3): 75 ug via ORAL
  Filled 2020-03-25 (×3): qty 1

## 2020-03-25 MED ORDER — ACETAMINOPHEN 325 MG PO TABS
650.0000 mg | ORAL_TABLET | Freq: Four times a day (QID) | ORAL | Status: DC | PRN
  Administered 2020-03-26: 650 mg via ORAL
  Filled 2020-03-25: qty 2

## 2020-03-25 MED ORDER — IOHEXOL 9 MG/ML PO SOLN
500.0000 mL | ORAL | Status: DC
  Administered 2020-03-25: 500 mL via ORAL

## 2020-03-25 NOTE — ED Provider Notes (Signed)
[facility] DEPT Provider Note   CSN: [id number] Arrival date & time: 03/25/20  0846     History Chief Complaint  Patient presents with  . Known blood clot in lung     Stefanie Camacho is a 74 y.o. female.  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  Patient has undergone radiation therapy for intracerebral metastatic disease in September 2021.  She has been undergoing chemotherapy\immunotherapy and hormonal therapy with Dr. Julien Nordmann managing physician.   Patient is referred to the emergency department today after getting a scheduled CT scan of the chest.  CT scan showed large pulmonary embolus.  Patient reports she has noted herself to be somewhat more short of breath over the past couple of weeks.  Occasionally she was noting some discomfort under the right breast.  She reports pain was not severe.  She mostly notes that she is extremely fatigued.  He has not had any fever or productive cough.  No hemoptysis.  She reports her pain has been controlled with Tylenol alone.  She has not noted any swelling or pain in her lower legs.  Patient is Covid immunizations are up-to-date including booster. Past Medical History:  Diagnosis Date  . Anxiety   . Dyslipidemia   . Essential hypertension   . GERD (gastroesophageal reflux disease)   . Hypothyroidism   . Left bundle branch block 06/04/2018  . NICM (nonischemic cardiomyopathy) ([place])   . Nonischemic cardiomyopathy ([place]) 06/04/2018   Ejection fraction 45% in summer 2019  . Sleep apnea     Patient Active Problem List   Diagnosis Date Noted  . Cough 03/18/2020  . Generalized weakness 03/18/2020  . Brain metastases ([place]) 01/29/2020  . 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 Bone metastasis ([place]) 01/28/2020  . Adenocarcinoma of left lung, stage 4 ([place]) 01/18/2020  . Encounter for antineoplastic chemotherapy 01/18/2020   . Goals of care, counseling/discussion 01/18/2020  . Malignant neoplasm of lower lobe of left lung ([place]) 01/14/2020  . Acute CVA (cerebrovascular accident) ([place]) 01/09/2020  . Intracranial bleeding ([place]) 01/09/2020  . TIA (transient ischemic attack) 01/08/2020  . Lumbar foraminal stenosis 12/30/2019  . Vertigo 09/25/2019  . Sleep apnea   . GERD (gastroesophageal reflux disease)   . Anxiety   . NICM (nonischemic cardiomyopathy) ([place])   . Hypothyroidism   . Nonischemic cardiomyopathy ([place]) 06/04/2018  . Essential hypertension 06/04/2018  . Left bundle branch block 06/04/2018  . Dyslipidemia 06/04/2018    Past Surgical History:  Procedure Laterality Date  . ABDOMINAL HYSTERECTOMY    . BLADDER NECK RECONSTRUCTION    . BRONCHIAL BIOPSY  01/12/2020   Procedure: BRONCHIAL BIOPSIES;  Surgeon: Garner Nash, DO;  Location: [facility];  Service: Pulmonary;;  . BRONCHIAL BRUSHINGS  01/12/2020   Procedure: BRONCHIAL BRUSHINGS;  Surgeon: Garner Nash, DO;  Location: [facility];  Service: Pulmonary;;  . BRONCHIAL NEEDLE ASPIRATION BIOPSY  01/12/2020   Procedure: BRONCHIAL NEEDLE ASPIRATION BIOPSIES;  Surgeon: Garner Nash, DO;  Location: [facility];  Service: Pulmonary;;  . BRONCHIAL WASHINGS  01/12/2020   Procedure: BRONCHIAL WASHINGS;  Surgeon: Garner Nash, DO;  Location: [facility];  Service: Pulmonary;;  . SHOULDER SURGERY    . TONSILLECTOMY    . VIDEO BRONCHOSCOPY WITH ENDOBRONCHIAL ULTRASOUND  01/12/2020   Procedure: VIDEO BRONCHOSCOPY WITH ENDOBRONCHIAL ULTRASOUND;  Surgeon: Garner Nash, DO;  Location: [facility];  Service: Pulmonary;;  OB History   No obstetric history on file.     Family History  Problem Relation Age of Onset  . Heart disease Mother   . Stroke Mother   . Heart disease Father   . Stroke Father   . Heart disease Maternal Grandfather   . Heart disease Paternal Grandfather     Social History   Tobacco Use  . Smoking status: Never  Smoker  . Smokeless tobacco: Never Used  Vaping Use  . Vaping Use: Never used  Substance Use Topics  . Alcohol use: Not Currently  . Drug use: Never    Home Medications Prior to Admission medications   Medication Sig Start Date End Date Taking? Authorizing Provider  acetaminophen (TYLENOL) 500 MG tablet Take 1,000 mg by mouth every 6 (six) hours as needed for mild pain.   Yes [provider]  aspirin EC 81 MG tablet Take 81 mg by mouth daily.   Yes [provider]  atorvastatin (LIPITOR) 10 MG tablet Take 1 tablet (10 mg total) by mouth daily at 6 PM. 01/13/20 04/12/20 Yes Hosie Poisson, MD  FLUoxetine (PROZAC) 10 MG tablet Take 1 tablet (10 mg total) by mouth daily. 01/13/20  Yes Hosie Poisson, MD  levETIRAcetam (KEPPRA) 500 MG tablet Take 1 tablet (500 mg total) by mouth 2 (two) times daily. 03/09/20  Yes Vaslow, Acey Lav, MD  levothyroxine (SYNTHROID) 75 MCG tablet Take 1 tablet (75 mcg total) by mouth daily. 01/14/20  Yes Hosie Poisson, MD  loperamide (IMODIUM) 2 MG capsule Take 2 mg by mouth as needed for diarrhea or loose stools.   Yes [provider]  losartan (COZAAR) 50 MG tablet Take 1 tablet (50 mg total) by mouth daily. 01/13/20  Yes Hosie Poisson, MD  metoprolol succinate (TOPROL-XL) 50 MG 24 hr tablet Take 1 tablet (50 mg total) by mouth daily. Take with or immediately following a meal. 01/13/20  Yes Hosie Poisson, MD  osimertinib mesylate (TAGRISSO) 80 MG tablet Take 2 tablets (160 mg total) by mouth daily. Days 1-28. 01/26/20  Yes Curt Bears, MD  pantoprazole (PROTONIX) 40 MG tablet Take 1 tablet (40 mg total) by mouth daily. 03/04/20  Yes Heilingoetter, Cassandra L, PA-C  prochlorperazine (COMPAZINE) 10 MG tablet Take 1 tablet (10 mg total) by mouth every 6 (six) hours as needed for nausea or vomiting. 01/26/20  Yes Tanner, Lyndon Code., PA-C  spironolactone (ALDACTONE) 25 MG tablet Take 0.5 tablets (12.5 mg total) by mouth daily. 01/13/20  Yes Hosie Poisson, MD   dexamethasone (DECADRON) 4 MG tablet Please take 1 tablet twice a day for 1 week, followed by 1 tablet per day for 1 week, followed by 1/2 a tablet for 1 week Patient not taking: Reported on 03/25/2020 02/01/20   Heilingoetter, Cassandra L, PA-C  methylPREDNISolone (MEDROL DOSEPAK) 4 MG TBPK tablet Use as instructed Patient not taking: Reported on 03/25/2020 03/18/20   Heilingoetter, Cassandra L, PA-C  oxyCODONE-acetaminophen (PERCOCET/ROXICET) 5-325 MG tablet Take 1 tablet by mouth every 8 (eight) hours as needed for severe pain. Patient not taking: Reported on 03/25/2020 02/03/20   Heilingoetter, Cassandra L, PA-C    Allergies    Patient has no known allergies.  Review of Systems   Review of Systems 10 systems reviewed and negative except as per HPI Physical Exam Updated Vital Signs BP 136/65   Pulse 63   Temp 97.8 F (36.6 C) (Oral)   Resp 14   SpO2 93%   Physical Exam Constitutional:  Comments: Alert with clear mental status.  No respiratory distress at rest.  Patient is nontoxic in appearance.  HENT:     Head: Normocephalic and atraumatic.     Mouth/Throat:     Pharynx: Oropharynx is clear.  Eyes:     Extraocular Movements: Extraocular movements intact.  Cardiovascular:     Rate and Rhythm: Normal rate and regular rhythm.     Comments: Distant heart sounds. Pulmonary:     Comments: Patient is on supplemental oxygen.  No respiratory distress at rest.  Mild tachypnea.  Crackles throughout most of the left lung field.  Right slightly more clear but decreased at the bases. Abdominal:     Comments: Abdomen is soft without guarding.  Patient does not endorse any tenderness of the upper abdomen.  Musculoskeletal:     Comments: No peripheral edema.  Calves are soft and nontender.  Skin condition of lower legs good.  Skin:    General: Skin is warm and dry.     Coloration: Skin is pale.  Neurological:     Comments: Patient is alert.  Speech is clear.  Cognitive function is  intact.  Psychiatric:        Mood and Affect: Mood normal.     ED Results / Procedures / Treatments   Labs (all labs ordered are listed, but only abnormal results are displayed) Labs Reviewed  CBC WITH DIFFERENTIAL/PLATELET - Abnormal; Notable for the following components:      Result Value   RDW 16.9 (*)    Platelets 146 (*)    Lymphs Abs 0.3 (*)    All other components within normal limits  URINALYSIS, ROUTINE W REFLEX MICROSCOPIC - Abnormal; Notable for the following components:   Color, Urine STRAW (*)    All other components within normal limits  BLOOD GAS, ARTERIAL - Abnormal; Notable for the following components:   pO2, Arterial 52.9 (*)    All other components within normal limits  RESP PANEL BY RT-PCR (FLU A&B, COVID) [id number]  PROTIME-INR  COMPREHENSIVE METABOLIC PANEL  LIPASE, BLOOD  LACTIC ACID, PLASMA  LACTIC ACID, PLASMA  TROPONIN I (HIGH SENSITIVITY)    EKG EKG Interpretation  Date/Time:  Friday March 25 2020 09:04:53 EST Ventricular Rate:  57 PR Interval:    QRS Duration: 100 QT Interval:  493 QTC Calculation: 481 R Axis:   40 Text Interpretation: Sinus rhythm Low voltage, extremity leads Nonspecific T abnormalities, lateral leads no change from previous Confirmed by Charlesetta Shanks [phone]) on 03/25/2020 10:17:55 AM   Radiology CT Chest W Contrast  Result Date: 03/25/2020 CLINICAL DATA:  Non-small cell lung cancer, metastatic assess treatment response. EXAM: CT CHEST, ABDOMEN, AND PELVIS WITH CONTRAST TECHNIQUE: Multidetector CT imaging of the chest, abdomen and pelvis was performed following the standard protocol during bolus administration of intravenous contrast. CONTRAST:  187mL OMNIPAQUE IOHEXOL 300 MG/ML  SOLN COMPARISON:  Comparison study of 01/10/2020 FINDINGS: CT CHEST FINDINGS Cardiovascular: Heart size moderately enlarged. RV to LV ratio of 0.86. Thoracic aorta is normal caliber with smooth contour. Pulmonary embolus in the RIGHT main  pulmonary artery extending into upper lobe lobar level branches and lower lobe lobar level branches. Mediastinum/Nodes: Thoracic inlet structures are normal.  The No axillary lymphadenopathy. No mediastinal lymphadenopathy. No hilar lymphadenopathy. Lungs/Pleura: Marked interval decrease in the number of well-defined nodules in the chest since January 10, 2020. Diminished size of LEFT lower lobe pulmonary mass since the prior study. (Image 94, series 4) 2.7 x 2.6 cm LEFT lower  lobe pulmonary nodule previously pulmonary mass measuring approximately 4.7 x 5.1 cm. Signs of septal thickening have developed since the previous study along with ground-glass in the lingula and LEFT lower lobe. (Image 91, series 4) 3.1 x 1.8 cm area of peripheral consolidative change with central lucency in the RIGHT lower lobe along the fissure abutting the pleura. Similar peripheral area of density in the RIGHT upper lobe 1.9 x 1.6 cm (image 36, series 4) the well-defined nodules that were centrally innumerable on the prior study have diminished in number and there are numerous areas of ground-glass nodularity throughout the chest in place of these nodules with more confluent ground-glass as discussed above. There is no pleural effusion. Airways are patent. Musculoskeletal: See below for full musculoskeletal details. 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 New small area of low density in the LEFT hepatic lobe (image 45, series 2) 13 mm. In hindsight there is very subtle heterogeneity in this area on the prior study, perhaps this represents treated metastatic lesion. There is also a subtle area of low attenuation in the RIGHT hepatic lobe not apparent on the prior study (image 60, series 2) 9 mm. Gallbladder without signs of pericholecystic stranding. No biliary duct dilation. Pancreas: Pancreatic atrophy without ductal dilation, sign of inflammation or focal lesion. Spleen: Spleen normal in size and  contour without focal lesion. Adrenals/Urinary Tract: Adrenal glands are normal. Symmetric renal enhancement. No hydronephrosis. Urinary bladder under distended. This limits assessment. Cysts in the RIGHT kidney similar to prior imaging. Stomach/Bowel: No acute gastrointestinal process.  Normal appendix. Vascular/Lymphatic: Patent abdominal vasculature. Scattered atheromatous plaque in the abdominal aorta. No aneurysmal dilation. There is no gastrohepatic or hepatoduodenal ligament lymphadenopathy. No retroperitoneal or mesenteric lymphadenopathy. No pelvic sidewall lymphadenopathy. Engorgement of RIGHT internal iliac vein and deep pelvic veins along the RIGHT pelvic sidewall. Variable attenuation with some areas suggesting central low attenuation, for instance on image 106 of series 2. Reproductive: Post hysterectomy.  No adnexal mass. Other: No hernia.  No ascites. Musculoskeletal: Metastatic lesions in ribs, RIGHT 6 rib with interval sclerosis and healing, further healing of a pathologic fracture on image 93 of series 4 New subacute fracture with similar appearance and healing at the site of lucency in the posterior ninth rib. Adjacent sclerotic focus similar to the prior study. No acute displaced rib fractures. Subtle sternal sclerotic foci in the sternal manubrium. New sclerotic foci LEFT humeral head just less than a cm size (image 11, series 4) Is lucent area in the RIGHT iliac on the prior study is now partially sclerotic (image 100, series 2) measuring approximately 2.8 cm. Irregularity of the RIGHT pubic root with sclerosis is similar to the previous exam. (Image 110, series 2) this extends into the acetabulum at this level Numerous new sclerotic foci throughout the pelvis and spine Numerous new sclerotic foci scattered throughout visualized skeleton, for instance on image 117 of series 2 in the proximal LEFT femur. A subtle lucent area seen just posterior to this and inferior to this on image 119 of  series 2 is an example of a sclerotic focus that has developed at the site of previous lucency. Further sclerosis at the margin of a sacral metastasis on image 94 of series 2 adjacent to the central canal along the sacrum. Numerous foci of sclerosis have developed in the spine. Is for instance, in hindsight an area of very subtle lucency in the spine at the T7 level shows dense sclerosis (image 101, series 6), nearly all  vertebral levels are involved. IMPRESSION: 1. Pulmonary embolus in the RIGHT main pulmonary artery extending into upper lobe lobar level branches and RIGHT lower lobe lobar level branches. Borderline RV to LV ratio nearly 0.9, RIGHT heart with some enlargement on the previous study. This is of uncertain clinical significance but could be seen in the early setting of RIGHT heart strain. Consider correlation with symptoms and echocardiography as warranted given that there is considerable clot in the RIGHT main pulmonary artery and its branches. 2. New areas of nodularity/consolidation in the RIGHT chest more likely related to pulmonary infarcts. Attention on follow-up 3. Marked interval response to therapy in the chest as described. 4. Nearly confluent ground-glass in the LEFT chest may represent post treatment changes given that nodularity elsewhere has transitioned from solid to ground-glass appearance. Disease was more extensive in this area which could explain these findings. Close attention on follow-up is suggested to exclude the possibility of superimposed pneumonitis. 5. Subtle lesions in the liver not apparent on previous imaging may represent treated, previously radiographically occult disease given other findings on the current study. Attention on follow-up is suggested. 6. Irregularity of the RIGHT pubic root could be at risk for pathologic fracture but is similar to the prior imaging study. 7. Signs of pathologic fracture in ribs with interval healing as described. Numerous new foci of  sclerosis involving nearly all visualized bones could represent interval response to therapy of pre-existing occult metastatic foci. Close attention on follow-up is suggested. 8. Engorgement of RIGHT iliac, internal iliac and deep pelvic veins, this may represent the source of the patient's embolus. 9. Aortic atherosclerosis. Critical Value/emergent results were called by telephone at the time of interpretation on 03/25/2020 at 08:30 a.m. to provider [facility] , who verbally acknowledged these results. Other nonemergent findings were communicated via the PRA as outlined in the Z vision dash board. Aortic Atherosclerosis (ICD10-I70.0). Electronically Signed   By: Zetta Bills M.D.   On: 03/25/2020 08:58   CT Abdomen Pelvis W Contrast  Result Date: 03/25/2020 CLINICAL DATA:  Non-small cell lung cancer, metastatic assess treatment response. EXAM: CT CHEST, ABDOMEN, AND PELVIS WITH CONTRAST TECHNIQUE: Multidetector CT imaging of the chest, abdomen and pelvis was performed following the standard protocol during bolus administration of intravenous contrast. CONTRAST:  187mL OMNIPAQUE IOHEXOL 300 MG/ML  SOLN COMPARISON:  Comparison study of 01/10/2020 FINDINGS: CT CHEST FINDINGS Cardiovascular: Heart size moderately enlarged. RV to LV ratio of 0.86. Thoracic aorta is normal caliber with smooth contour. Pulmonary embolus in the RIGHT main pulmonary artery extending into upper lobe lobar level branches and lower lobe lobar level branches. Mediastinum/Nodes: Thoracic inlet structures are normal.  The No axillary lymphadenopathy. No mediastinal lymphadenopathy. No hilar lymphadenopathy. Lungs/Pleura: Marked interval decrease in the number of well-defined nodules in the chest since January 10, 2020. Diminished size of LEFT lower lobe pulmonary mass since the prior study. (Image 94, series 4) 2.7 x 2.6 cm LEFT lower lobe pulmonary nodule previously pulmonary mass measuring approximately 4.7 x 5.1 cm. Signs  of septal thickening have developed since the previous study along with ground-glass in the lingula and LEFT lower lobe. (Image 91, series 4) 3.1 x 1.8 cm area of peripheral consolidative change with central lucency in the RIGHT lower lobe along the fissure abutting the pleura. Similar peripheral area of density in the RIGHT upper lobe 1.9 x 1.6 cm (image 36, series 4) the well-defined nodules that were centrally innumerable on the prior study have diminished in number and there  are numerous areas of ground-glass nodularity throughout the chest in place of these nodules with more confluent ground-glass as discussed above. There is no pleural effusion. Airways are patent. Musculoskeletal: See below for full musculoskeletal details. 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 New small area of low density in the LEFT hepatic lobe (image 45, series 2) 13 mm. In hindsight there is very subtle heterogeneity in this area on the prior study, perhaps this represents treated metastatic lesion. There is also a subtle area of low attenuation in the RIGHT hepatic lobe not apparent on the prior study (image 60, series 2) 9 mm. Gallbladder without signs of pericholecystic stranding. No biliary duct dilation. Pancreas: Pancreatic atrophy without ductal dilation, sign of inflammation or focal lesion. Spleen: Spleen normal in size and contour without focal lesion. Adrenals/Urinary Tract: Adrenal glands are normal. Symmetric renal enhancement. No hydronephrosis. Urinary bladder under distended. This limits assessment. Cysts in the RIGHT kidney similar to prior imaging. Stomach/Bowel: No acute gastrointestinal process.  Normal appendix. Vascular/Lymphatic: Patent abdominal vasculature. Scattered atheromatous plaque in the abdominal aorta. No aneurysmal dilation. There is no gastrohepatic or hepatoduodenal ligament lymphadenopathy. No retroperitoneal or mesenteric lymphadenopathy. No pelvic sidewall  lymphadenopathy. Engorgement of RIGHT internal iliac vein and deep pelvic veins along the RIGHT pelvic sidewall. Variable attenuation with some areas suggesting central low attenuation, for instance on image 106 of series 2. Reproductive: Post hysterectomy.  No adnexal mass. Other: No hernia.  No ascites. Musculoskeletal: Metastatic lesions in ribs, RIGHT 6 rib with interval sclerosis and healing, further healing of a pathologic fracture on image 93 of series 4 New subacute fracture with similar appearance and healing at the site of lucency in the posterior ninth rib. Adjacent sclerotic focus similar to the prior study. No acute displaced rib fractures. Subtle sternal sclerotic foci in the sternal manubrium. New sclerotic foci LEFT humeral head just less than a cm size (image 11, series 4) Is lucent area in the RIGHT iliac on the prior study is now partially sclerotic (image 100, series 2) measuring approximately 2.8 cm. Irregularity of the RIGHT pubic root with sclerosis is similar to the previous exam. (Image 110, series 2) this extends into the acetabulum at this level Numerous new sclerotic foci throughout the pelvis and spine Numerous new sclerotic foci scattered throughout visualized skeleton, for instance on image 117 of series 2 in the proximal LEFT femur. A subtle lucent area seen just posterior to this and inferior to this on image 119 of series 2 is an example of a sclerotic focus that has developed at the site of previous lucency. Further sclerosis at the margin of a sacral metastasis on image 94 of series 2 adjacent to the central canal along the sacrum. Numerous foci of sclerosis have developed in the spine. Is for instance, in hindsight an area of very subtle lucency in the spine at the T7 level shows dense sclerosis (image 101, series 6), nearly all vertebral levels are involved. IMPRESSION: 1. Pulmonary embolus in the RIGHT main pulmonary artery extending into upper lobe lobar level branches and  RIGHT lower lobe lobar level branches. Borderline RV to LV ratio nearly 0.9, RIGHT heart with some enlargement on the previous study. This is of uncertain clinical significance but could be seen in the early setting of RIGHT heart strain. Consider correlation with symptoms and echocardiography as warranted given that there is considerable clot in the RIGHT main pulmonary artery and its branches. 2. New areas of nodularity/consolidation in the RIGHT chest  more likely related to pulmonary infarcts. Attention on follow-up 3. Marked interval response to therapy in the chest as described. 4. Nearly confluent ground-glass in the LEFT chest may represent post treatment changes given that nodularity elsewhere has transitioned from solid to ground-glass appearance. Disease was more extensive in this area which could explain these findings. Close attention on follow-up is suggested to exclude the possibility of superimposed pneumonitis. 5. Subtle lesions in the liver not apparent on previous imaging may represent treated, previously radiographically occult disease given other findings on the current study. Attention on follow-up is suggested. 6. Irregularity of the RIGHT pubic root could be at risk for pathologic fracture but is similar to the prior imaging study. 7. Signs of pathologic fracture in ribs with interval healing as described. Numerous new foci of sclerosis involving nearly all visualized bones could represent interval response to therapy of pre-existing occult metastatic foci. Close attention on follow-up is suggested. 8. Engorgement of RIGHT iliac, internal iliac and deep pelvic veins, this may represent the source of the patient's embolus. 9. Aortic atherosclerosis. Critical Value/emergent results were called by telephone at the time of interpretation on 03/25/2020 at 08:30 a.m. to provider [facility] , who verbally acknowledged these results. Other nonemergent findings were communicated via the  PRA as outlined in the Z vision dash board. Aortic Atherosclerosis (ICD10-I70.0). Electronically Signed   By: Zetta Bills M.D.   On: 03/25/2020 08:58    Procedures Procedures (including critical care time) CRITICAL CARE Performed by: Charlesetta Shanks   Total critical care time: 30  minutes  Critical care time was exclusive of separately billable procedures and treating other patients.  Critical care was necessary to treat or prevent imminent or life-threatening deterioration.  Critical care was time spent personally by me on the following activities: development of treatment plan with patient and/or surrogate as well as nursing, discussions with consultants, evaluation of patient's response to treatment, examination of patient, obtaining history from patient or surrogate, ordering and performing treatments and interventions, ordering and review of laboratory studies, ordering and review of radiographic studies, pulse oximetry and re-evaluation of patient's condition. Medications Ordered in ED Medications - No data to display  ED Course  I have reviewed the triage vital signs and the nursing notes.  Pertinent labs & imaging results that were available during my care of the patient were reviewed by me and considered in my medical decision making (see chart for details).  Consult: [facility] hospitalist for admission.  Clinical Course as of Mar 26 1019  Fri Mar 25, 2020  1005 Consult: Reviewed with Dr. Vaughan Browner critical care.  Advises is he will review the patient's case and discuss with IR.  At this time I will continue to hold heparin until decision making regarding risk-benefit of anticoagulation versus mechanical management by Critical care    [MP]    Clinical Course User Index [MP] Charlesetta Shanks, MD   MDM Rules/Calculators/A&P                          Patient presents as outlined.  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  Patient got a surveillance CT today showing a large right pulmonary embolus, borderline for cor pulmonale.  Patient has been symptomatic with some increased shortness of breath.  She was hypoxic at radiology suite.  Her mental status is clear.  Patient is not exhibiting significant respiratory distress at rest.  On 2 L oxygen saturations at the mid 90s.  She is not tachycardic.  Despite extent of PE, patient clinically is stable at this time.  Due to concerns for possible risk of intracerebral hemorrhage given underlying metastatic disease, critical care consulted to review risk benefits of possible mechanical\interventional treatment versus general anticoagulation.  Patient is admitted to medical hospitalist service. 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

## 2020-03-25 NOTE — Progress Notes (Signed)
  Echocardiogram 2D Echocardiogram has been performed.  Jennette Dubin 03/25/2020, 11:19 AM

## 2020-03-25 NOTE — ED Triage Notes (Signed)
Pt presents with c/o known blood clot in her lungs. Pt is a cancer patient, was sent for a CT, confirmed a blood clot in her lungs.

## 2020-03-25 NOTE — Plan of Care (Signed)
  Problem: Activity: Goal: Risk for activity intolerance will decrease Outcome: Progressing   Problem: Pain Managment: Goal: General experience of comfort will improve Outcome: Progressing   Problem: Education: Goal: Knowledge of General Education information will improve Description: Including pain rating scale, medication(s)/side effects and non-pharmacologic comfort measures Outcome: Completed/Met   

## 2020-03-25 NOTE — Progress Notes (Signed)
Lower extremity venous has been completed.   Preliminary results in CV Proc.   Abram Sander 03/25/2020 11:05 AM

## 2020-03-25 NOTE — Discharge Instructions (Signed)
Information on my medicine - ELIQUIS (apixaban)  This medication education was reviewed with me or my healthcare representative as part of my discharge preparation.   Why was Eliquis prescribed for you? Eliquis was prescribed to treat blood clots that may have been found in the veins of your legs (deep vein thrombosis) or in your lungs (pulmonary embolism) and to reduce the risk of them occurring again.  What do You need to know about Eliquis ? The starting dose is 10 mg (two 5 mg tablets) taken TWICE daily for the FIRST SEVEN (7) DAYS, then on 04/01/2020  the dose is reduced to ONE 5 mg tablet taken TWICE daily.  Eliquis may be taken with or without food.   Try to take the dose about the same time in the morning and in the evening. If you have difficulty swallowing the tablet whole please discuss with your pharmacist how to take the medication safely.  Take Eliquis exactly as prescribed and DO NOT stop taking Eliquis without talking to the doctor who prescribed the medication.  Stopping may increase your risk of developing a new blood clot.  Refill your prescription before you run out.  After discharge, you should have regular check-up appointments with your healthcare provider that is prescribing your Eliquis.    What do you do if you miss a dose? If a dose of ELIQUIS is not taken at the scheduled time, take it as soon as possible on the same day and twice-daily administration should be resumed. The dose should not be doubled to make up for a missed dose.  Important Safety Information A possible side effect of Eliquis is bleeding. You should call your healthcare provider right away if you experience any of the following: ? Bleeding from an injury or your nose that does not stop. ? Unusual colored urine (red or dark brown) or unusual colored stools (red or black). ? Unusual bruising for unknown reasons. ? A serious fall or if you hit your head (even if there is no  bleeding).  Some medicines may interact with Eliquis and might increase your risk of bleeding or clotting while on Eliquis. To help avoid this, consult your healthcare provider or pharmacist prior to using any new prescription or non-prescription medications, including herbals, vitamins, non-steroidal anti-inflammatory drugs (NSAIDs) and supplements.  This website has more information on Eliquis (apixaban): http://www.eliquis.com/eliquis/home

## 2020-03-25 NOTE — Telephone Encounter (Signed)
The patient is presently in the radiology department. The patient was having a CT scan today to restage her lung cancer. She has been reporting hypoxia at home and shortness of breath. She received supplemental oxygen at home two days ago. CT scan from today shows a right pulmonary embolism. The patient denies chest pain. The radiologist mentioned that she had some right heart enlargement. He also mentioned that her deep pelvic veins appeared dilated which may be the source of the clot if standard lower extremity dopplers are negative. I spoke to the patient and we recommend that she go to the emergency room for management given these findings and for close monitoring of her condition.

## 2020-03-25 NOTE — H&P (Signed)
History and Physical    Stefanie Camacho ZOX:[id number] DOB: 09/13/1945 DOA: 03/25/2020  PCP: Stefanie Ada, MD  Patient coming from: Home  Chief Complaint: Dyspnea  HPI: Stefanie Camacho is a 74 y.o. female with medical history significant of lung cancer with brain mets. Presenting after discovery of a pulmonary embolism during CT staging of her lung cancer. She reports that she has had "slight" dyspnea over the last month. She has become short of breath with activity. She has been following with [facility] on her lung cancer. They assisted her in getting some home oxygen (1L [place] all day). She does not seem to notice much of a difference while using it. She states that she went to get a CT exam to stage her cancer and see how treatment was progressing. She was told that she had a PE and that she should come to the ED.   ED Course: EDP spoke with PCCM. PCCM spoke with IR. She is not a candidate for mechanical thrombectomy. Echo did not show RV strain. She is not a candidate for directed lytics. RvB of anticoagulation was discussed with her and she elected to be started on eliquis. TRH was called for admission.   Review of Systems:  Denies CP, palpitations, F, N/V/D, ab pain. Review of systems is otherwise negative for all not mentioned in HPI.   PMHx Past Medical History:  Diagnosis Date  . Anxiety   . Dyslipidemia   . Essential hypertension   . GERD (gastroesophageal reflux disease)   . Hypothyroidism   . Left bundle branch block 06/04/2018  . NICM (nonischemic cardiomyopathy) ([place])   . Nonischemic cardiomyopathy ([place]) 06/04/2018   Ejection fraction 45% in summer 2019  . Sleep apnea     PSHx Past Surgical History:  Procedure Laterality Date  . ABDOMINAL HYSTERECTOMY    . BLADDER NECK RECONSTRUCTION    . BRONCHIAL BIOPSY  01/12/2020   Procedure: BRONCHIAL BIOPSIES;  Surgeon: Garner Nash, DO;  Location: [facility];  Service: Pulmonary;;  . BRONCHIAL BRUSHINGS  01/12/2020   Procedure:  BRONCHIAL BRUSHINGS;  Surgeon: Garner Nash, DO;  Location: [facility];  Service: Pulmonary;;  . BRONCHIAL NEEDLE ASPIRATION BIOPSY  01/12/2020   Procedure: BRONCHIAL NEEDLE ASPIRATION BIOPSIES;  Surgeon: Garner Nash, DO;  Location: [facility];  Service: Pulmonary;;  . BRONCHIAL WASHINGS  01/12/2020   Procedure: BRONCHIAL WASHINGS;  Surgeon: Garner Nash, DO;  Location: [facility];  Service: Pulmonary;;  . SHOULDER SURGERY    . TONSILLECTOMY    . VIDEO BRONCHOSCOPY WITH ENDOBRONCHIAL ULTRASOUND  01/12/2020   Procedure: VIDEO BRONCHOSCOPY WITH ENDOBRONCHIAL ULTRASOUND;  Surgeon: Garner Nash, DO;  Location: [facility];  Service: Pulmonary;;    SocHx  reports that she has never smoked. She has never used smokeless tobacco. She reports previous alcohol use. She reports that she does not use drugs.  No Known Allergies  FamHx Family History  Problem Relation Age of Onset  . Heart disease Mother   . Stroke Mother   . Heart disease Father   . Stroke Father   . Heart disease Maternal Grandfather   . Heart disease Paternal Grandfather     Prior to Admission medications   Medication Sig Start Date End Date Taking? Authorizing Provider  acetaminophen (TYLENOL) 500 MG tablet Take 1,000 mg by mouth every 6 (six) hours as needed for mild pain.   Yes [provider]  aspirin EC 81 MG tablet Take 81 mg by mouth daily.  Yes [provider]  atorvastatin (LIPITOR) 10 MG tablet Take 1 tablet (10 mg total) by mouth daily at 6 PM. 01/13/20 04/12/20 Yes Hosie Poisson, MD  FLUoxetine (PROZAC) 10 MG tablet Take 1 tablet (10 mg total) by mouth daily. 01/13/20  Yes Hosie Poisson, MD  levETIRAcetam (KEPPRA) 500 MG tablet Take 1 tablet (500 mg total) by mouth 2 (two) times daily. 03/09/20  Yes Vaslow, Acey Lav, MD  levothyroxine (SYNTHROID) 75 MCG tablet Take 1 tablet (75 mcg total) by mouth daily. 01/14/20  Yes Hosie Poisson, MD  loperamide (IMODIUM) 2 MG capsule Take 2 mg by  mouth as needed for diarrhea or loose stools.   Yes [provider]  losartan (COZAAR) 50 MG tablet Take 1 tablet (50 mg total) by mouth daily. 01/13/20  Yes Hosie Poisson, MD  metoprolol succinate (TOPROL-XL) 50 MG 24 hr tablet Take 1 tablet (50 mg total) by mouth daily. Take with or immediately following a meal. 01/13/20  Yes Hosie Poisson, MD  osimertinib mesylate (TAGRISSO) 80 MG tablet Take 2 tablets (160 mg total) by mouth daily. Days 1-28. 01/26/20  Yes Curt Bears, MD  pantoprazole (PROTONIX) 40 MG tablet Take 1 tablet (40 mg total) by mouth daily. 03/04/20  Yes Heilingoetter, Cassandra L, PA-C  prochlorperazine (COMPAZINE) 10 MG tablet Take 1 tablet (10 mg total) by mouth every 6 (six) hours as needed for nausea or vomiting. 01/26/20  Yes Tanner, Lyndon Code., PA-C  spironolactone (ALDACTONE) 25 MG tablet Take 0.5 tablets (12.5 mg total) by mouth daily. 01/13/20  Yes Hosie Poisson, MD  dexamethasone (DECADRON) 4 MG tablet Please take 1 tablet twice a day for 1 week, followed by 1 tablet per day for 1 week, followed by 1/2 a tablet for 1 week Patient not taking: Reported on 03/25/2020 02/01/20   Heilingoetter, Cassandra L, PA-C  methylPREDNISolone (MEDROL DOSEPAK) 4 MG TBPK tablet Use as instructed Patient not taking: Reported on 03/25/2020 03/18/20   Heilingoetter, Cassandra L, PA-C  oxyCODONE-acetaminophen (PERCOCET/ROXICET) 5-325 MG tablet Take 1 tablet by mouth every 8 (eight) hours as needed for severe pain. Patient not taking: Reported on 03/25/2020 02/03/20   Heilingoetter, Tobe Sos, PA-C    Physical Exam: Vitals:   03/25/20 0905 03/25/20 0915 03/25/20 1030 03/25/20 1100  BP: 136/65 136/65 (!) 127/59 (!) 135/57  Pulse: (!) 58 63 (!) 54 (!) 56  Resp: 20 14 20 17   Temp:      TempSrc:      SpO2:  93% 91% 95%    General: 74 y.o. female resting in bed in NAD Eyes: PERRL, normal sclera ENMT: Nares patent w/o discharge, orophaynx clear, dentition normal, ears w/o  discharge/lesions/ulcers Neck: Supple, trachea midline Cardiovascular: RRR, +S1, S2, no m/g/r, equal pulses throughout Respiratory: CTABL, no w/r/r, normal WOB GI: BS+, NDNT, no masses noted, no organomegaly noted MSK: No e/c/c Skin: No rashes, bruises, ulcerations noted Neuro: A&O x 3, no focal deficits Psyc: Appropriate interaction and affect, calm/cooperative  Labs on Admission: I have personally reviewed following labs and imaging studies  CBC: Recent Labs  Lab 03/25/20 0923  WBC 5.2  NEUTROABS 4.4  HGB 12.6  HCT 39.7  MCV 90.6  PLT 536*   Basic Metabolic Panel: Recent Labs  Lab 03/25/20 0923  NA 134*  K 4.1  CL 99  CO2 26  GLUCOSE 90  BUN 18  CREATININE 1.06*  CALCIUM 8.8*   GFR: Estimated Creatinine Clearance: 51.6 mL/min (A) (by C-G formula based on SCr of 1.06  mg/dL (H)). Liver Function Tests: Recent Labs  Lab 03/25/20 0923  AST 20  ALT 23  ALKPHOS 96  BILITOT 0.7  PROT 6.2*  ALBUMIN 3.2*   Recent Labs  Lab 03/25/20 0923  LIPASE 58*   No results for input(s): AMMONIA in the last 168 hours. Coagulation Profile: Recent Labs  Lab 03/25/20 0923  INR 1.2   Cardiac Enzymes: No results for input(s): CKTOTAL, CKMB, CKMBINDEX, TROPONINI in the last 168 hours. BNP (last 3 results) No results for input(s): PROBNP in the last 8760 hours. HbA1C: No results for input(s): HGBA1C in the last 72 hours. CBG: No results for input(s): GLUCAP in the last 168 hours. Lipid Profile: No results for input(s): CHOL, HDL, LDLCALC, TRIG, CHOLHDL, LDLDIRECT in the last 72 hours. Thyroid Function Tests: No results for input(s): TSH, T4TOTAL, FREET4, T3FREE, THYROIDAB in the last 72 hours. Anemia Panel: No results for input(s): VITAMINB12, FOLATE, FERRITIN, TIBC, IRON, RETICCTPCT in the last 72 hours. Urine analysis:    Component Value Date/Time   COLORURINE STRAW (A) 03/25/2020 0947   APPEARANCEUR CLEAR 03/25/2020 0947   LABSPEC 1.028 03/25/2020 0947    PHURINE 6.0 03/25/2020 [place] 03/25/2020 [place] 03/25/2020 [place] 03/25/2020 [place] 03/25/2020 0947   PROTEINUR NEGATIVE 03/25/2020 0947   NITRITE NEGATIVE 03/25/2020 0947   LEUKOCYTESUR NEGATIVE 03/25/2020 0947    Radiological Exams on Admission: CT Chest W Contrast  Result Date: 03/25/2020 CLINICAL DATA:  Non-small cell lung cancer, metastatic assess treatment response. EXAM: CT CHEST, ABDOMEN, AND PELVIS WITH CONTRAST TECHNIQUE: Multidetector CT imaging of the chest, abdomen and pelvis was performed following the standard protocol during bolus administration of intravenous contrast. CONTRAST:  157mL OMNIPAQUE IOHEXOL 300 MG/ML  SOLN COMPARISON:  Comparison study of 01/10/2020 FINDINGS: CT CHEST FINDINGS Cardiovascular: Heart size moderately enlarged. RV to LV ratio of 0.86. Thoracic aorta is normal caliber with smooth contour. Pulmonary embolus in the RIGHT main pulmonary artery extending into upper lobe lobar level branches and lower lobe lobar level branches. Mediastinum/Nodes: Thoracic inlet structures are normal.  The No axillary lymphadenopathy. No mediastinal lymphadenopathy. No hilar lymphadenopathy. Lungs/Pleura: Marked interval decrease in the number of well-defined nodules in the chest since January 10, 2020. Diminished size of LEFT lower lobe pulmonary mass since the prior study. (Image 94, series 4) 2.7 x 2.6 cm LEFT lower lobe pulmonary nodule previously pulmonary mass measuring approximately 4.7 x 5.1 cm. Signs of septal thickening have developed since the previous study along with ground-glass in the lingula and LEFT lower lobe. (Image 91, series 4) 3.1 x 1.8 cm area of peripheral consolidative change with central lucency in the RIGHT lower lobe along the fissure abutting the pleura. Similar peripheral area of density in the RIGHT upper lobe 1.9 x 1.6 cm (image 36, series 4) the well-defined nodules that were  centrally innumerable on the prior study have diminished in number and there are numerous areas of ground-glass nodularity throughout the chest in place of these nodules with more confluent ground-glass as discussed above. There is no pleural effusion. Airways are patent. Musculoskeletal: See below for full musculoskeletal details. 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 New small area of low density in the LEFT hepatic lobe (image 45, series 2) 13 mm. In hindsight there is very subtle heterogeneity in this area on the prior study, perhaps this represents treated metastatic lesion. There is also a subtle area of low attenuation  in the RIGHT hepatic lobe not apparent on the prior study (image 60, series 2) 9 mm. Gallbladder without signs of pericholecystic stranding. No biliary duct dilation. Pancreas: Pancreatic atrophy without ductal dilation, sign of inflammation or focal lesion. Spleen: Spleen normal in size and contour without focal lesion. Adrenals/Urinary Tract: Adrenal glands are normal. Symmetric renal enhancement. No hydronephrosis. Urinary bladder under distended. This limits assessment. Cysts in the RIGHT kidney similar to prior imaging. Stomach/Bowel: No acute gastrointestinal process.  Normal appendix. Vascular/Lymphatic: Patent abdominal vasculature. Scattered atheromatous plaque in the abdominal aorta. No aneurysmal dilation. There is no gastrohepatic or hepatoduodenal ligament lymphadenopathy. No retroperitoneal or mesenteric lymphadenopathy. No pelvic sidewall lymphadenopathy. Engorgement of RIGHT internal iliac vein and deep pelvic veins along the RIGHT pelvic sidewall. Variable attenuation with some areas suggesting central low attenuation, for instance on image 106 of series 2. Reproductive: Post hysterectomy.  No adnexal mass. Other: No hernia.  No ascites. Musculoskeletal: Metastatic lesions in ribs, RIGHT 6 rib with interval sclerosis and healing, further  healing of a pathologic fracture on image 93 of series 4 New subacute fracture with similar appearance and healing at the site of lucency in the posterior ninth rib. Adjacent sclerotic focus similar to the prior study. No acute displaced rib fractures. Subtle sternal sclerotic foci in the sternal manubrium. New sclerotic foci LEFT humeral head just less than a cm size (image 11, series 4) Is lucent area in the RIGHT iliac on the prior study is now partially sclerotic (image 100, series 2) measuring approximately 2.8 cm. Irregularity of the RIGHT pubic root with sclerosis is similar to the previous exam. (Image 110, series 2) this extends into the acetabulum at this level Numerous new sclerotic foci throughout the pelvis and spine Numerous new sclerotic foci scattered throughout visualized skeleton, for instance on image 117 of series 2 in the proximal LEFT femur. A subtle lucent area seen just posterior to this and inferior to this on image 119 of series 2 is an example of a sclerotic focus that has developed at the site of previous lucency. Further sclerosis at the margin of a sacral metastasis on image 94 of series 2 adjacent to the central canal along the sacrum. Numerous foci of sclerosis have developed in the spine. Is for instance, in hindsight an area of very subtle lucency in the spine at the T7 level shows dense sclerosis (image 101, series 6), nearly all vertebral levels are involved. IMPRESSION: 1. Pulmonary embolus in the RIGHT main pulmonary artery extending into upper lobe lobar level branches and RIGHT lower lobe lobar level branches. Borderline RV to LV ratio nearly 0.9, RIGHT heart with some enlargement on the previous study. This is of uncertain clinical significance but could be seen in the early setting of RIGHT heart strain. Consider correlation with symptoms and echocardiography as warranted given that there is considerable clot in the RIGHT main pulmonary artery and its branches. 2. New areas  of nodularity/consolidation in the RIGHT chest more likely related to pulmonary infarcts. Attention on follow-up 3. Marked interval response to therapy in the chest as described. 4. Nearly confluent ground-glass in the LEFT chest may represent post treatment changes given that nodularity elsewhere has transitioned from solid to ground-glass appearance. Disease was more extensive in this area which could explain these findings. Close attention on follow-up is suggested to exclude the possibility of superimposed pneumonitis. 5. Subtle lesions in the liver not apparent on previous imaging may represent treated, previously radiographically occult disease given other findings on  the current study. Attention on follow-up is suggested. 6. Irregularity of the RIGHT pubic root could be at risk for pathologic fracture but is similar to the prior imaging study. 7. Signs of pathologic fracture in ribs with interval healing as described. Numerous new foci of sclerosis involving nearly all visualized bones could represent interval response to therapy of pre-existing occult metastatic foci. Close attention on follow-up is suggested. 8. Engorgement of RIGHT iliac, internal iliac and deep pelvic veins, this may represent the source of the patient's embolus. 9. Aortic atherosclerosis. Critical Value/emergent results were called by telephone at the time of interpretation on 03/25/2020 at 08:30 a.m. to provider [facility] , who verbally acknowledged these results. Other nonemergent findings were communicated via the PRA as outlined in the Z vision dash board. Aortic Atherosclerosis (ICD10-I70.0). Electronically Signed   By: Zetta Bills M.D.   On: 03/25/2020 08:58   CT Abdomen Pelvis W Contrast  Result Date: 03/25/2020 CLINICAL DATA:  Non-small cell lung cancer, metastatic assess treatment response. EXAM: CT CHEST, ABDOMEN, AND PELVIS WITH CONTRAST TECHNIQUE: Multidetector CT imaging of the chest, abdomen and pelvis  was performed following the standard protocol during bolus administration of intravenous contrast. CONTRAST:  117mL OMNIPAQUE IOHEXOL 300 MG/ML  SOLN COMPARISON:  Comparison study of 01/10/2020 FINDINGS: CT CHEST FINDINGS Cardiovascular: Heart size moderately enlarged. RV to LV ratio of 0.86. Thoracic aorta is normal caliber with smooth contour. Pulmonary embolus in the RIGHT main pulmonary artery extending into upper lobe lobar level branches and lower lobe lobar level branches. Mediastinum/Nodes: Thoracic inlet structures are normal.  The No axillary lymphadenopathy. No mediastinal lymphadenopathy. No hilar lymphadenopathy. Lungs/Pleura: Marked interval decrease in the number of well-defined nodules in the chest since January 10, 2020. Diminished size of LEFT lower lobe pulmonary mass since the prior study. (Image 94, series 4) 2.7 x 2.6 cm LEFT lower lobe pulmonary nodule previously pulmonary mass measuring approximately 4.7 x 5.1 cm. Signs of septal thickening have developed since the previous study along with ground-glass in the lingula and LEFT lower lobe. (Image 91, series 4) 3.1 x 1.8 cm area of peripheral consolidative change with central lucency in the RIGHT lower lobe along the fissure abutting the pleura. Similar peripheral area of density in the RIGHT upper lobe 1.9 x 1.6 cm (image 36, series 4) the well-defined nodules that were centrally innumerable on the prior study have diminished in number and there are numerous areas of ground-glass nodularity throughout the chest in place of these nodules with more confluent ground-glass as discussed above. There is no pleural effusion. Airways are patent. Musculoskeletal: See below for full musculoskeletal details. 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 New small area of low density in the LEFT hepatic lobe (image 45, series 2) 13 mm. In hindsight there is very subtle heterogeneity in this area on the prior study, perhaps  this represents treated metastatic lesion. There is also a subtle area of low attenuation in the RIGHT hepatic lobe not apparent on the prior study (image 60, series 2) 9 mm. Gallbladder without signs of pericholecystic stranding. No biliary duct dilation. Pancreas: Pancreatic atrophy without ductal dilation, sign of inflammation or focal lesion. Spleen: Spleen normal in size and contour without focal lesion. Adrenals/Urinary Tract: Adrenal glands are normal. Symmetric renal enhancement. No hydronephrosis. Urinary bladder under distended. This limits assessment. Cysts in the RIGHT kidney similar to prior imaging. Stomach/Bowel: No acute gastrointestinal process.  Normal appendix. Vascular/Lymphatic: Patent abdominal vasculature. Scattered atheromatous plaque in the abdominal  aorta. No aneurysmal dilation. There is no gastrohepatic or hepatoduodenal ligament lymphadenopathy. No retroperitoneal or mesenteric lymphadenopathy. No pelvic sidewall lymphadenopathy. Engorgement of RIGHT internal iliac vein and deep pelvic veins along the RIGHT pelvic sidewall. Variable attenuation with some areas suggesting central low attenuation, for instance on image 106 of series 2. Reproductive: Post hysterectomy.  No adnexal mass. Other: No hernia.  No ascites. Musculoskeletal: Metastatic lesions in ribs, RIGHT 6 rib with interval sclerosis and healing, further healing of a pathologic fracture on image 93 of series 4 New subacute fracture with similar appearance and healing at the site of lucency in the posterior ninth rib. Adjacent sclerotic focus similar to the prior study. No acute displaced rib fractures. Subtle sternal sclerotic foci in the sternal manubrium. New sclerotic foci LEFT humeral head just less than a cm size (image 11, series 4) Is lucent area in the RIGHT iliac on the prior study is now partially sclerotic (image 100, series 2) measuring approximately 2.8 cm. Irregularity of the RIGHT pubic root with sclerosis is  similar to the previous exam. (Image 110, series 2) this extends into the acetabulum at this level Numerous new sclerotic foci throughout the pelvis and spine Numerous new sclerotic foci scattered throughout visualized skeleton, for instance on image 117 of series 2 in the proximal LEFT femur. A subtle lucent area seen just posterior to this and inferior to this on image 119 of series 2 is an example of a sclerotic focus that has developed at the site of previous lucency. Further sclerosis at the margin of a sacral metastasis on image 94 of series 2 adjacent to the central canal along the sacrum. Numerous foci of sclerosis have developed in the spine. Is for instance, in hindsight an area of very subtle lucency in the spine at the T7 level shows dense sclerosis (image 101, series 6), nearly all vertebral levels are involved. IMPRESSION: 1. Pulmonary embolus in the RIGHT main pulmonary artery extending into upper lobe lobar level branches and RIGHT lower lobe lobar level branches. Borderline RV to LV ratio nearly 0.9, RIGHT heart with some enlargement on the previous study. This is of uncertain clinical significance but could be seen in the early setting of RIGHT heart strain. Consider correlation with symptoms and echocardiography as warranted given that there is considerable clot in the RIGHT main pulmonary artery and its branches. 2. New areas of nodularity/consolidation in the RIGHT chest more likely related to pulmonary infarcts. Attention on follow-up 3. Marked interval response to therapy in the chest as described. 4. Nearly confluent ground-glass in the LEFT chest may represent post treatment changes given that nodularity elsewhere has transitioned from solid to ground-glass appearance. Disease was more extensive in this area which could explain these findings. Close attention on follow-up is suggested to exclude the possibility of superimposed pneumonitis. 5. Subtle lesions in the liver not apparent on  previous imaging may represent treated, previously radiographically occult disease given other findings on the current study. Attention on follow-up is suggested. 6. Irregularity of the RIGHT pubic root could be at risk for pathologic fracture but is similar to the prior imaging study. 7. Signs of pathologic fracture in ribs with interval healing as described. Numerous new foci of sclerosis involving nearly all visualized bones could represent interval response to therapy of pre-existing occult metastatic foci. Close attention on follow-up is suggested. 8. Engorgement of RIGHT iliac, internal iliac and deep pelvic veins, this may represent the source of the patient's embolus. 9. Aortic atherosclerosis. Critical Value/emergent  results were called by telephone at the time of interpretation on 03/25/2020 at 08:30 a.m. to provider [facility] , who verbally acknowledged these results. Other nonemergent findings were communicated via the PRA as outlined in the Z vision dash board. Aortic Atherosclerosis (ICD10-I70.0). Electronically Signed   By: Zetta Bills M.D.   On: 03/25/2020 08:58   VAS [place] LOWER EXTREMITY VENOUS (DVT)  Result Date: 03/25/2020  Lower Venous DVT Study Indications: Swelling, and Pain.  Comparison Study: 01/09/20 previous negative Performing Technologist: Abram Sander RVS  Examination Guidelines: A complete evaluation includes B-mode imaging, spectral Doppler, color Doppler, and power Doppler as needed of all accessible portions of each vessel. Bilateral testing is considered an integral part of a complete examination. Limited examinations for reoccurring indications may be performed as noted. The reflux portion of the exam is performed with the patient in reverse Trendelenburg.  +---------+---------------+---------+-----------+----------+--------------+ RIGHT    CompressibilityPhasicitySpontaneityPropertiesThrombus Aging  +---------+---------------+---------+-----------+----------+--------------+ CFV      Full           Yes      Yes                                 +---------+---------------+---------+-----------+----------+--------------+ SFJ      Full                                                        +---------+---------------+---------+-----------+----------+--------------+ FV Prox  Full                                                        +---------+---------------+---------+-----------+----------+--------------+ FV Mid   Full                                                        +---------+---------------+---------+-----------+----------+--------------+ FV DistalFull                                                        +---------+---------------+---------+-----------+----------+--------------+ PFV      Full                                                        +---------+---------------+---------+-----------+----------+--------------+ POP      Full           Yes      Yes                                 +---------+---------------+---------+-----------+----------+--------------+ PTV      Full                                                        +---------+---------------+---------+-----------+----------+--------------+  PERO     Full                                                        +---------+---------------+---------+-----------+----------+--------------+   +---------+---------------+---------+-----------+----------+-------------------+ LEFT     CompressibilityPhasicitySpontaneityPropertiesThrombus Aging      +---------+---------------+---------+-----------+----------+-------------------+ CFV      None           No       No                   Age Indeterminate   +---------+---------------+---------+-----------+----------+-------------------+ SFJ      Full                                                              +---------+---------------+---------+-----------+----------+-------------------+ FV Prox  None                                         Age Indeterminate   +---------+---------------+---------+-----------+----------+-------------------+ FV Mid   None                                         Age Indeterminate   +---------+---------------+---------+-----------+----------+-------------------+ FV DistalNone                                         Age Indeterminate   +---------+---------------+---------+-----------+----------+-------------------+ PFV      None                                         Age Indeterminate   +---------+---------------+---------+-----------+----------+-------------------+ POP      None           Yes      Yes                  Age Indeterminate   +---------+---------------+---------+-----------+----------+-------------------+ PTV      Full                                                             +---------+---------------+---------+-----------+----------+-------------------+ PERO                                                  Not well visualized +---------+---------------+---------+-----------+----------+-------------------+ IIV                     Yes      Yes                                      +---------+---------------+---------+-----------+----------+-------------------+  Summary: RIGHT: - There is no evidence of deep vein thrombosis in the lower extremity.  - No cystic structure found in the popliteal fossa.  LEFT: - Findings consistent with age indeterminate deep vein thrombosis involving the left common femoral vein, left proximal profunda vein, left popliteal vein, and left femoral vein.  *See table(s) above for measurements and observations.    Preliminary     EKG: Independently reviewed. 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 Earlie Server. Maintain follow up  Hypothyroidism     - synthroid  GERD     - protonix  HTN     - holding home meds right how as her BP was a little soft during interview, monitor for ability to resume  Anxiety/Depression     - continue prozac  Chronic hypoxic respiratory failure     - on 1L [place] at home; continue  CKD 3a     - she is at her baseline Scr     - watch nephrotoxins     - follow  DVT prophylaxis: eliquis  Code Status: DNR  Family Communication: w/ sister at bedside  Consults called: EDP consulted PCCM   Status is: Observation  The patient remains OBS appropriate and will d/c before 2 midnights.  Dispo: The patient is from: Home              Anticipated d/c is to: Home              Anticipated d/c date is: 1 day              Patient currently is not medically stable to d/c.   Jonnie Finner DO [facility]  If 7PM-7AM, please contact night-coverage [email or web address]  03/25/2020, 11:45 AM

## 2020-03-25 NOTE — Consult Note (Signed)
NAME:  Stefanie Camacho, MRN:  [id number], DOB:  1945-10-25, LOS: 0 ADMISSION DATE:  03/25/2020, CONSULTATION DATE: 11/19 REFERRING MD:  Dr. Johnney Killian, CHIEF COMPLAINT:  Blood clot  Brief History   73 y/o F with metastatic adenocarcinoma admitted with right PE.   History of present illness   74 y/o F with metastatic adenocarcinoma of the lung who presented to [facility] on 11/19 with reports of CT findings of blood clot in her lungs.   The patient is followed by Dr. Earlie Server and Dr. 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 She has been symptomatic with metastatic brain burden to include vertigo, seizure, left sided facial weakness and is on keppra.  She is planned for whole brain radiation. She also has bony involvement and possible liver. The patient's lung mass & nodules had good response to Tagrisso.    The patient was seen in the [facility] with reports of low oxygen saturations and was prescribed supplemental oxygen. She was planned for a CT of the chest, abd/pelvis on 11/9 for restaging of her lung cancer and reported shortness of breath on presentation.  CT imaging demonstrated a right pulmonary embolism.  The patient was referred to the ER for further evaluation.   PCCM consulted for pulmonary evaluation.  Pt denies LE swelling, pain with inspiration, chest pain or shortness of breath.   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  RV/LV ratio 0.9, new nodularity/consolidation in the right chest / suspect related to pulmonary infarcts, marked interval response of left lung mass, confluent GGO in the  left chest may represent post treatment changes, evidence of healing pathologic fractures of ribs CTA Abd/Pelvis 11/19 >> subtle lesions in liver, irregularity of right pubic root, engorgement of right iliac, internal iliac and deep pelvic veins LE Duplex 11/19 >> age indeterminate DVT involving the left common femoral vein, left proximal profunda vein, left popliteal vein and left femoral vein  ECHO 11/19 >>   Micro Data:  COVID 11/19 >>  Influenza 11/9 >>   Antimicrobials:      Interim history/subjective:  As above   Objective   Blood pressure 136/65, pulse 63, temperature 97.8 F (36.6 C), temperature source Oral, resp. rate 14, SpO2 93 %.       No intake or output data in the 24 hours ending 03/25/20 1043 There were no vitals filed for this visit.  Examination: General: adult female lying in bed in NAD   HEENT: MM pink/moist, anicteric, head covering in place Neuro: AAOx4, speech clear, MAE  CV: s1s2 RRR, no m/r/g PULM: non-labored on 1L [place] O2, lungs bilaterally clear  GI: soft, bsx4 active  Extremities: warm/dry, slight difference between L>R LE. No edema  Skin: no rashes or lesions on exposed skin.  Resolved Hospital Problem list     Assessment & Plan:   Acute Right Pulmonary Embolism LLE DVT  Incidental finding on follow up CT, RV/LV ratio 0.9 on CT, right sided PE and LLE DVT. Suspect provoked PE in setting of malignancy.  No ECHO findings to support RV strain.  Hemodynamically  stable on 1L O2.  -reviewed risks / benefits with patient regarding anticoagulation initiation.  Specifically that she is at risk for intracranial hemorrhage with her metastatic disease involvement.  She indicates understanding and accepts risks.  Sister at bedside for conversation. -begin eliquis PO per pharmacy  -ECHO does not demonstrate RV strain  -patient would not be a candidate for tPA given her metastatic disease  -anticipate she would be life long anticoagulation  -reviewed case  with IR and clot burden is not large enough for mechanical thrombectomy  Adenocarcinoma of Left Lung with Metastatic Disease to Brain / Leptomeningeal Involvement  -follow up with [facility]C for further plan of care   Best practice:  Diet: as tolerated  Pain/Anxiety/Delirium protocol (if indicated): NA VAP protocol (if indicated): NA DVT prophylaxis: Full dose anticoagulation  GI prophylaxis: ppi Glucose control: n/a  Mobility: bedrest Code Status: full code Family Communication: Patient and sister updated at bedside 11/19 Disposition: Per [place]   Labs   CBC: Recent Labs  Lab 03/25/20 0923  WBC 5.2  NEUTROABS 4.4  HGB 12.6  HCT 39.7  MCV 90.6  PLT 146*    Basic Metabolic Panel: Recent Labs  Lab 03/25/20 0923  NA 134*  K 4.1  CL 99  CO2 26  GLUCOSE 90  BUN 18  CREATININE 1.06*  CALCIUM 8.8*   GFR: Estimated Creatinine Clearance: 51.6 mL/min (A) (by C-G formula based on SCr of 1.06 mg/dL (H)). Recent Labs  Lab 03/25/20 0923  WBC 5.2  LATICACIDVEN 1.0    Liver Function Tests: Recent Labs  Lab 03/25/20 0923  AST 20  ALT 23  ALKPHOS 96  BILITOT 0.7  PROT 6.2*  ALBUMIN 3.2*   Recent Labs  Lab 03/25/20 0923  LIPASE 58*   No results for input(s): AMMONIA in the last 168 hours.  ABG    Component Value Date/Time   PHART 7.420 03/25/2020 1000   PCO2ART 37.7 03/25/2020 1000   PO2ART 52.9 (L) 03/25/2020 1000   HCO3 24.0 03/25/2020 1000   O2SAT 86.8 03/25/2020 1000     Coagulation Profile: Recent Labs  Lab 03/25/20 0923  INR 1.2    Cardiac Enzymes: No results for input(s): CKTOTAL, CKMB, CKMBINDEX, TROPONINI in the last 168 hours.  HbA1C: Hgb A1c MFr Bld  Date/Time Value Ref Range Status  01/09/2020 04:16 AM 6.0 (H) 4.8 - 5.6 % Final    Comment:    (NOTE) Pre diabetes:          5.7%-6.4%  Diabetes:              >6.4%  Glycemic control for   <7.0% adults with diabetes     CBG: No results for input(s): GLUCAP in the last 168  hours.  Review of Systems: Positives in [place]   Gen: Denies fever, chills, weight change, fatigue, night sweats HEENT: Denies blurred vision, double vision, hearing loss, tinnitus, sinus congestion, rhinorrhea, sore throat, neck stiffness, dysphagia PULM: Denies shortness of breath, cough, sputum production, hemoptysis, wheezing CV: Denies chest pain, edema, orthopnea, paroxysmal nocturnal dyspnea, palpitations GI: Denies abdominal pain, nausea, vomiting, diarrhea, hematochezia, melena, constipation, change in bowel habits GU: Denies dysuria, hematuria, polyuria, oliguria, urethral discharge Endocrine: Denies hot or cold intolerance, polyuria, polyphagia or appetite change Derm: Denies rash, dry skin, scaling or peeling skin change Heme: Denies easy bruising, bleeding, bleeding gums Neuro: Denies headache, numbness, weakness, slurred speech, loss of memory or consciousness MSK: right hip pain  Past Medical History  She,  has  a past medical history of Anxiety, Dyslipidemia, Essential hypertension, GERD (gastroesophageal reflux disease), Hypothyroidism, Left bundle branch block (06/04/2018), NICM (nonischemic cardiomyopathy) ([place]), Nonischemic cardiomyopathy ([place]) (06/04/2018), and Sleep apnea.   Surgical History    Past Surgical History:  Procedure Laterality Date  . ABDOMINAL HYSTERECTOMY    . BLADDER NECK RECONSTRUCTION    . BRONCHIAL BIOPSY  01/12/2020   Procedure: BRONCHIAL BIOPSIES;  Surgeon: Garner Nash, DO;  Location: [facility];  Service: Pulmonary;;  . BRONCHIAL BRUSHINGS  01/12/2020   Procedure: BRONCHIAL BRUSHINGS;  Surgeon: Garner Nash, DO;  Location: [facility];  Service: Pulmonary;;  . BRONCHIAL NEEDLE ASPIRATION BIOPSY  01/12/2020   Procedure: BRONCHIAL NEEDLE ASPIRATION BIOPSIES;  Surgeon: Garner Nash, DO;  Location: [facility];  Service: Pulmonary;;  . BRONCHIAL WASHINGS  01/12/2020   Procedure: BRONCHIAL WASHINGS;  Surgeon: Garner Nash, DO;  Location: [facility];  Service: Pulmonary;;  . SHOULDER SURGERY    . TONSILLECTOMY    . VIDEO BRONCHOSCOPY WITH ENDOBRONCHIAL ULTRASOUND  01/12/2020   Procedure: VIDEO BRONCHOSCOPY WITH ENDOBRONCHIAL ULTRASOUND;  Surgeon: Garner Nash, DO;  Location: [facility];  Service: Pulmonary;;     Social History   reports that she has never smoked. She has never used smokeless tobacco. She reports previous alcohol use. She reports that she does not use drugs.   Family History   Her family history includes Heart disease in her father, maternal grandfather, mother, and paternal grandfather; Stroke in her father and mother.   Allergies No Known Allergies   Home Medications  Prior to Admission medications   Medication Sig Start Date End Date Taking? Authorizing Provider  acetaminophen (TYLENOL) 500 MG tablet Take 1,000 mg by mouth every 6 (six) hours as needed for mild pain.   Yes [provider]  aspirin EC 81 MG tablet Take 81 mg by mouth daily.   Yes [provider]  atorvastatin (LIPITOR) 10 MG tablet Take 1 tablet (10 mg total) by mouth daily at 6 PM. 01/13/20 04/12/20 Yes Hosie Poisson, MD  FLUoxetine (PROZAC) 10 MG tablet Take 1 tablet (10 mg total) by mouth daily. 01/13/20  Yes Hosie Poisson, MD  levETIRAcetam (KEPPRA) 500 MG tablet Take 1 tablet (500 mg total) by mouth 2 (two) times daily. 03/09/20  Yes Vaslow, Acey Lav, MD  levothyroxine (SYNTHROID) 75 MCG tablet Take 1 tablet (75 mcg total) by mouth daily. 01/14/20  Yes Hosie Poisson, MD  loperamide (IMODIUM) 2 MG capsule Take 2 mg by mouth as needed for diarrhea or loose stools.   Yes [provider]  losartan (COZAAR) 50 MG tablet Take 1 tablet (50 mg total) by mouth daily. 01/13/20  Yes Hosie Poisson, MD  metoprolol succinate (TOPROL-XL) 50 MG 24 hr tablet Take 1 tablet (50 mg total) by mouth daily. Take with or immediately following a meal. 01/13/20  Yes Hosie Poisson, MD  osimertinib mesylate (TAGRISSO) 80 MG tablet Take 2  tablets (160 mg total) by mouth daily. Days 1-28. 01/26/20  Yes Curt Bears, MD  pantoprazole (PROTONIX) 40 MG tablet Take 1 tablet (40 mg total) by mouth daily. 03/04/20  Yes Heilingoetter, Cassandra L, PA-C  prochlorperazine (COMPAZINE) 10 MG tablet Take 1 tablet (10 mg total) by mouth every 6 (six) hours as needed for nausea or vomiting. 01/26/20  Yes Tanner, Lyndon Code., PA-C  spironolactone (ALDACTONE) 25 MG tablet Take 0.5 tablets (12.5 mg total) by mouth daily. 01/13/20  Yes Hosie Poisson, MD  dexamethasone (DECADRON) 4 MG  tablet Please take 1 tablet twice a day for 1 week, followed by 1 tablet per day for 1 week, followed by 1/2 a tablet for 1 week Patient not taking: Reported on 03/25/2020 02/01/20   Heilingoetter, Cassandra L, PA-C  methylPREDNISolone (MEDROL DOSEPAK) 4 MG TBPK tablet Use as instructed Patient not taking: Reported on 03/25/2020 03/18/20   Heilingoetter, Cassandra L, PA-C  oxyCODONE-acetaminophen (PERCOCET/ROXICET) 5-325 MG tablet Take 1 tablet by mouth every 8 (eight) hours as needed for severe pain. Patient not taking: Reported on 03/25/2020 02/03/20   Heilingoetter, Tobe Sos, PA-C     Critical care time: n/a    Noe Gens, MSN, NP-C, AGACNP-BC [facility] 03/25/2020, 10:43 AM   Please see [email or web address] for pager details.

## 2020-03-25 NOTE — Progress Notes (Signed)
ANTICOAGULATION CONSULT NOTE  Pharmacy Consult for Eliquis Indication: Acute pulmonary embolus and DVT  No Known Allergies  Patient Measurements:   Heparin Dosing Weight:   Vital Signs: Temp: 97.8 F (36.6 C) (11/19 0851) Temp Source: Oral (11/19 0851) BP: 98/74 (11/19 1200) Pulse Rate: 52 (11/19 1200)  Labs: Recent Labs    03/25/20 0923 03/25/20 1132  HGB 12.6  --   HCT 39.7  --   PLT 146*  --   LABPROT 14.4  --   INR 1.2  --   CREATININE 1.06*  --   TROPONINIHS <2 5    Estimated Creatinine Clearance: 51.6 mL/min (A) (by C-G formula based on SCr of 1.06 mg/dL (H)).   Assessment: Patient is a 74 y.o F with metastatic lung cancer to brain and leptomeningeal on Tagrisso PTA, presented to the ED on 11/19 for workup of SOB. Chest CTA came back positive for acute PE and LE doppler showed age indeterminate left DVT. Pulmonary team discussed bleeding risks associated with being anticoagulation and pt agreed to proceed. Pharmacy is consulted to start Eliquis for VTE treatment.   Plan:  - Eliquis 10 mg bid x7 days, then 5mg  bid - monitor for s/sx bleeding - pharmacy will sign off, but will follow patient peripherally along with you  Branna Cortina P 03/25/2020,12:49 PM

## 2020-03-25 NOTE — ED Notes (Signed)
Transportation notified for transportation needs.

## 2020-03-26 ENCOUNTER — Inpatient Hospital Stay: Payer: Medicare HMO

## 2020-03-26 DIAGNOSIS — C7931 Secondary malignant neoplasm of brain: Secondary | ICD-10-CM

## 2020-03-26 DIAGNOSIS — C7949 Secondary malignant neoplasm of other parts of nervous system: Secondary | ICD-10-CM | POA: Diagnosis not present

## 2020-03-26 DIAGNOSIS — I10 Essential (primary) hypertension: Secondary | ICD-10-CM | POA: Diagnosis not present

## 2020-03-26 DIAGNOSIS — K219 Gastro-esophageal reflux disease without esophagitis: Secondary | ICD-10-CM

## 2020-03-26 DIAGNOSIS — C3492 Malignant neoplasm of unspecified part of left bronchus or lung: Secondary | ICD-10-CM

## 2020-03-26 DIAGNOSIS — E039 Hypothyroidism, unspecified: Secondary | ICD-10-CM

## 2020-03-26 DIAGNOSIS — I2699 Other pulmonary embolism without acute cor pulmonale: Secondary | ICD-10-CM | POA: Diagnosis not present

## 2020-03-26 LAB — COMPREHENSIVE METABOLIC PANEL
ALT: 17 U/L (ref 0–44)
AST: 15 U/L (ref 15–41)
Albumin: 2.7 g/dL — ABNORMAL LOW (ref 3.5–5.0)
Alkaline Phosphatase: 84 U/L (ref 38–126)
Anion gap: 10 (ref 5–15)
BUN: 16 mg/dL (ref 8–23)
CO2: 23 mmol/L (ref 22–32)
Calcium: 8.4 mg/dL — ABNORMAL LOW (ref 8.9–10.3)
Chloride: 101 mmol/L (ref 98–111)
Creatinine, Ser: 0.98 mg/dL (ref 0.44–1.00)
GFR, Estimated: >60 mL/min (ref >60)
Glucose, Bld: 92 mg/dL (ref 70–99)
Potassium: 3.7 mmol/L (ref 3.5–5.1)
Sodium: 134 mmol/L — ABNORMAL LOW (ref 135–145)
Total Bilirubin: 0.7 mg/dL (ref 0.3–1.2)
Total Protein: 5.4 g/dL — ABNORMAL LOW (ref 6.5–8.1)

## 2020-03-26 LAB — CBC
HCT: 37.5 % (ref 36.0–46.0)
Hemoglobin: 12.1 g/dL (ref 12.0–15.0)
MCH: 29.1 pg (ref 26.0–34.0)
MCHC: 32.3 g/dL (ref 30.0–36.0)
MCV: 90.1 fL (ref 80.0–100.0)
Platelets: 127 10*3/uL — ABNORMAL LOW (ref 150–400)
RBC: 4.16 MIL/uL (ref 3.87–5.11)
RDW: 16.8 % — ABNORMAL HIGH (ref 11.5–15.5)
WBC: 3.7 10*3/uL — ABNORMAL LOW (ref 4.0–10.5)
nRBC: 0 % (ref 0.0–0.2)

## 2020-03-26 MED ORDER — SODIUM CHLORIDE 0.9 % IV BOLUS
1000.0000 mL | Freq: Once | INTRAVENOUS | Status: AC
  Administered 2020-03-26: 1000 mL via INTRAVENOUS

## 2020-03-26 MED ORDER — ALUM & MAG HYDROXIDE-SIMETH 200-200-20 MG/5ML PO SUSP
30.0000 mL | ORAL | Status: DC | PRN
  Administered 2020-03-26: 30 mL via ORAL
  Filled 2020-03-26: qty 30

## 2020-03-26 NOTE — Progress Notes (Signed)
PROGRESS NOTE    Stefanie Camacho  ZOX:[id number] DOB: November 20, 1945 DOA: 03/25/2020 PCP: [name], MD    Chief Complaint  Patient presents with  . Known blood clot in lung     Brief Narrative:   74 year old lady prior history of lung cancer with brain mets, hypertension, GERD, hypothyroidism, nonischemic cardiomyopathy with left ventricular ejection fraction of 45% in 2019, sleep apnea, hyperlipidemia had a CT staging of her lung cancer at which time she was found to have a pulmonary embolism and was referred to ED for further evaluation.  She is currently on 1 to 2 L of nasal cannula oxygen to keep sats greater than 90%.  On arrival to ED PCCM was consulted to see if she is a candidate for mechanical thrombectomy.  Echocardiogram did not show any RV strain and she is not a candidate for thrombectomy.  She was started on Eliquis at this time.  And referred to [facility] for admission. She underwent venous duplex of the lower extremity showing DVT in the left common femoral vein, left femoral vein, left popliteal vein.  Patient seen and examined at bedside.  She reports she has not ambulated yet still feels short of breath and is on 2 L of nasal cannula oxygen to keep sats greater than 90%. PT evaluation ordered. Assessment & Plan:   Active Problems:   Nonischemic cardiomyopathy (HCC)   Essential hypertension   Hypothyroidism   GERD (gastroesophageal reflux disease)   Anxiety   Malignant neoplasm of lower lobe of left lung (HCC)   Adenocarcinoma of left lung, stage 4 (HCC)   Bone metastasis (HCC)   Brain metastases (HCC)   Leptomeningeal metastases (HCC)   Pulmonary embolism (HCC)   Acute pulmonary embolism lower extremity DVT on the left She was started on Eliquis and she is at risk for intracranial bleed given brain mets.  Risk and benefits of medication discussed with the patient.  Patient is not a candidate for catheter Lidex and discussed with IR and patient is not a candidate for  mechanical thrombectomy at this time. Echocardiogram are reassuring, no RV strain at this time currently requiring up to 2 L of nasal cannula oxygen.  Patient reports she feels still short of breath and is currently on 2 L of nasal cannula oxygen to keep sats greater than 90%.  Recommend PT evaluation prior to discharge.    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 Lisbeth Renshaw. Currently on Keppra for seizure prophylaxis.   Hypothyroidism Continue with Synthroid at this time.   Essential hypertension Blood pressure parameters are optimal at this time.  Thrombocytopenia Watch for signs of bleeding at this time as patient is on Eliquis and has brain mets.   Mild hyponatremia Continue to monitor.   History of nonischemic cardiomyopathy Patient currently denies any chest pain.    Hyperlipidemia Continue with Lipitor.    GERD Stable continue with Protonix  DVT prophylaxis: (Eliquis Code Status: Full code Family Communication: None at bedside  disposition:   Status is: Observation  The patient will require care spanning > 2 midnights and should be moved to inpatient because: Inpatient level of care appropriate due to severity of illness  Dispo: The patient is from: Home              Anticipated d/c is to: Home              Anticipated d/c date is: 1 day  Patient currently is not medically stable to d/c.       Consultants:   None  Procedures: Echo Antimicrobials: None  Subjective: Still feels some shortness of breath  Objective: Vitals:   03/26/20 0249 03/26/20 0539 03/26/20 0940 03/26/20 1332  BP: (!) 107/58 117/66 114/71 132/63  Pulse: 81 73 77 67  Resp: 20 18 (!) 21 19  Temp: 97.8 F (36.6 C) 98.8 F (37.1 C) 98.8 F (37.1 C) 99.8 F (37.7 C)  TempSrc: Oral Oral Oral Oral  SpO2: 92% (!) 89% 91% 92%  Weight:      Height:        Intake/Output Summary (Last  24 hours) at 03/26/2020 1548 Last data filed at 03/25/2020 1935 Gross per 24 hour  Intake --  Output 300 ml  Net -300 ml   Filed Weights   03/25/20 1710  Weight: 79.3 kg    Examination:  General exam: Appears calm and comfortable on 2 lit [place] oxygen.  Respiratory system: Clear to auscultation. Respiratory effort normal.  Tachypnea heard Cardiovascular system: S1 & S2 heard, RRR. No JVD, . No pedal edema. Gastrointestinal system: Abdomen is nondistended, soft and non tender. . Normal bowel sounds heard. Central nervous system: Alert and oriented. No focal neurological deficits. Extremities: Symmetric 5 x 5 power. Skin: No rashes, lesions or ulcers Psychiatry: Mood & affect appropriate.     Data Reviewed: I have personally reviewed following labs and imaging studies  CBC: Recent Labs  Lab 03/25/20 0923 03/26/20 0521  WBC 5.2 3.7*  NEUTROABS 4.4  --   HGB 12.6 12.1  HCT 39.7 37.5  MCV 90.6 90.1  PLT 146* 127*    Basic Metabolic Panel: Recent Labs  Lab 03/25/20 0923 03/26/20 0521  NA 134* 134*  K 4.1 3.7  CL 99 101  CO2 26 23  GLUCOSE 90 92  BUN 18 16  CREATININE 1.06* 0.98  CALCIUM 8.8* 8.4*    GFR: Estimated Creatinine Clearance: 55.1 mL/min (by C-G formula based on SCr of 0.98 mg/dL).  Liver Function Tests: Recent Labs  Lab 03/25/20 0923 03/26/20 0521  AST 20 15  ALT 23 17  ALKPHOS 96 84  BILITOT 0.7 0.7  PROT 6.2* 5.4*  ALBUMIN 3.2* 2.7*    CBG: No results for input(s): GLUCAP in the last 168 hours.   Recent Results (from the past 240 hour(s))  Resp Panel by RT-PCR (Flu A&B, Covid) Nasopharyngeal Swab     Status: None   Collection Time: 03/25/20  9:10 AM   Specimen: Nasopharyngeal Swab; Nasopharyngeal(NP) swabs in vial transport medium  Result Value Ref Range Status   SARS Coronavirus 2 by RT PCR NEGATIVE NEGATIVE Final    Comment: (NOTE) SARS-CoV-2 target nucleic acids are NOT DETECTED.  The SARS-CoV-2 RNA is generally detectable in  upper respiratory specimens during the acute phase of infection. The lowest concentration of SARS-CoV-2 viral copies this assay can detect is 138 copies/mL. A negative result does not preclude SARS-Cov-2 infection and should not be used as the sole basis for treatment or other patient management decisions. A negative result may occur with  improper specimen collection/handling, submission of specimen other than nasopharyngeal swab, presence of viral mutation(s) within the areas targeted by this assay, and inadequate number of viral copies(<138 copies/mL). A negative result must be combined with clinical observations, patient history, and epidemiological information. The expected result is Negative.  Fact Sheet for Patients:  EntrepreneurPulse.com.au  Fact Sheet for Healthcare Providers:  IncredibleEmployment.be  This test  is no t yet approved or cleared by the [place] and  has been authorized for detection and/or diagnosis of SARS-CoV-2 by FDA under an Emergency Use Authorization (EUA). This EUA will remain  in effect (meaning this test can be used) for the duration of the COVID-19 declaration under Section 564(b)(1) of the Act, 21 U.S.C.section 360bbb-3(b)(1), unless the authorization is terminated  or revoked sooner.       Influenza A by PCR NEGATIVE NEGATIVE Final   Influenza B by PCR NEGATIVE NEGATIVE Final    Comment: (NOTE) The Xpert Xpress SARS-CoV-2/FLU/RSV plus assay is intended as an aid in the diagnosis of influenza from Nasopharyngeal swab specimens and should not be used as a sole basis for treatment. Nasal washings and aspirates are unacceptable for Xpert Xpress SARS-CoV-2/FLU/RSV testing.  Fact Sheet for Patients: EntrepreneurPulse.com.au  Fact Sheet for Healthcare Providers: IncredibleEmployment.be  This test is not yet approved or cleared by the [place] FDA and has been  authorized for detection and/or diagnosis of SARS-CoV-2 by FDA under an Emergency Use Authorization (EUA). This EUA will remain in effect (meaning this test can be used) for the duration of the COVID-19 declaration under Section 564(b)(1) of the Act, 21 U.S.C. section 360bbb-3(b)(1), unless the authorization is terminated or revoked.  Performed at [facility], [place] [address] [place], [place] [address]          Radiology Studies: CT Chest W Contrast  Result Date: 03/25/2020 CLINICAL DATA:  Non-small cell lung cancer, metastatic assess treatment response. EXAM: CT CHEST, ABDOMEN, AND PELVIS WITH CONTRAST TECHNIQUE: Multidetector CT imaging of the chest, abdomen and pelvis was performed following the standard protocol during bolus administration of intravenous contrast. CONTRAST:  113mL OMNIPAQUE IOHEXOL 300 MG/ML  SOLN COMPARISON:  Comparison study of 01/10/2020 FINDINGS: CT CHEST FINDINGS Cardiovascular: Heart size moderately enlarged. RV to LV ratio of 0.86. Thoracic aorta is normal caliber with smooth contour. Pulmonary embolus in the RIGHT main pulmonary artery extending into upper lobe lobar level branches and lower lobe lobar level branches. Mediastinum/Nodes: Thoracic inlet structures are normal.  The No axillary lymphadenopathy. No mediastinal lymphadenopathy. No hilar lymphadenopathy. Lungs/Pleura: Marked interval decrease in the number of well-defined nodules in the chest since January 10, 2020. Diminished size of LEFT lower lobe pulmonary mass since the prior study. (Image 94, series 4) 2.7 x 2.6 cm LEFT lower lobe pulmonary nodule previously pulmonary mass measuring approximately 4.7 x 5.1 cm. Signs of septal thickening have developed since the previous study along with ground-glass in the lingula and LEFT lower lobe. (Image 91, series 4) 3.1 x 1.8 cm area of peripheral consolidative change with central lucency in the RIGHT lower lobe along the fissure abutting the  pleura. Similar peripheral area of density in the RIGHT upper lobe 1.9 x 1.6 cm (image 36, series 4) the well-defined nodules that were centrally innumerable on the prior study have diminished in number and there are numerous areas of ground-glass nodularity throughout the chest in place of these nodules with more confluent ground-glass as discussed above. There is no pleural effusion. Airways are patent. Musculoskeletal: See below for full musculoskeletal details. 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 New small area of low density in the LEFT hepatic lobe (image 45, series 2) 13 mm. In hindsight there is very subtle heterogeneity in this area on the prior study, perhaps this represents treated metastatic lesion. There is also a subtle area of low attenuation in the RIGHT hepatic lobe  not apparent on the prior study (image 60, series 2) 9 mm. Gallbladder without signs of pericholecystic stranding. No biliary duct dilation. Pancreas: Pancreatic atrophy without ductal dilation, sign of inflammation or focal lesion. Spleen: Spleen normal in size and contour without focal lesion. Adrenals/Urinary Tract: Adrenal glands are normal. Symmetric renal enhancement. No hydronephrosis. Urinary bladder under distended. This limits assessment. Cysts in the RIGHT kidney similar to prior imaging. Stomach/Bowel: No acute gastrointestinal process.  Normal appendix. Vascular/Lymphatic: Patent abdominal vasculature. Scattered atheromatous plaque in the abdominal aorta. No aneurysmal dilation. There is no gastrohepatic or hepatoduodenal ligament lymphadenopathy. No retroperitoneal or mesenteric lymphadenopathy. No pelvic sidewall lymphadenopathy. Engorgement of RIGHT internal iliac vein and deep pelvic veins along the RIGHT pelvic sidewall. Variable attenuation with some areas suggesting central low attenuation, for instance on image 106 of series 2. Reproductive: Post hysterectomy.  No adnexal mass.  Other: No hernia.  No ascites. Musculoskeletal: Metastatic lesions in ribs, RIGHT 6 rib with interval sclerosis and healing, further healing of a pathologic fracture on image 93 of series 4 New subacute fracture with similar appearance and healing at the site of lucency in the posterior ninth rib. Adjacent sclerotic focus similar to the prior study. No acute displaced rib fractures. Subtle sternal sclerotic foci in the sternal manubrium. New sclerotic foci LEFT humeral head just less than a cm size (image 11, series 4) Is lucent area in the RIGHT iliac on the prior study is now partially sclerotic (image 100, series 2) measuring approximately 2.8 cm. Irregularity of the RIGHT pubic root with sclerosis is similar to the previous exam. (Image 110, series 2) this extends into the acetabulum at this level Numerous new sclerotic foci throughout the pelvis and spine Numerous new sclerotic foci scattered throughout visualized skeleton, for instance on image 117 of series 2 in the proximal LEFT femur. A subtle lucent area seen just posterior to this and inferior to this on image 119 of series 2 is an example of a sclerotic focus that has developed at the site of previous lucency. Further sclerosis at the margin of a sacral metastasis on image 94 of series 2 adjacent to the central canal along the sacrum. Numerous foci of sclerosis have developed in the spine. Is for instance, in hindsight an area of very subtle lucency in the spine at the T7 level shows dense sclerosis (image 101, series 6), nearly all vertebral levels are involved. IMPRESSION: 1. Pulmonary embolus in the RIGHT main pulmonary artery extending into upper lobe lobar level branches and RIGHT lower lobe lobar level branches. Borderline RV to LV ratio nearly 0.9, RIGHT heart with some enlargement on the previous study. This is of uncertain clinical significance but could be seen in the early setting of RIGHT heart strain. Consider correlation with symptoms and  echocardiography as warranted given that there is considerable clot in the RIGHT main pulmonary artery and its branches. 2. New areas of nodularity/consolidation in the RIGHT chest more likely related to pulmonary infarcts. Attention on follow-up 3. Marked interval response to therapy in the chest as described. 4. Nearly confluent ground-glass in the LEFT chest may represent post treatment changes given that nodularity elsewhere has transitioned from solid to ground-glass appearance. Disease was more extensive in this area which could explain these findings. Close attention on follow-up is suggested to exclude the possibility of superimposed pneumonitis. 5. Subtle lesions in the liver not apparent on previous imaging may represent treated, previously radiographically occult disease given other findings on the current study. Attention on  follow-up is suggested. 6. Irregularity of the RIGHT pubic root could be at risk for pathologic fracture but is similar to the prior imaging study. 7. Signs of pathologic fracture in ribs with interval healing as described. Numerous new foci of sclerosis involving nearly all visualized bones could represent interval response to therapy of pre-existing occult metastatic foci. Close attention on follow-up is suggested. 8. Engorgement of RIGHT iliac, internal iliac and deep pelvic veins, this may represent the source of the patient's embolus. 9. Aortic atherosclerosis. Critical Value/emergent results were called by telephone at the time of interpretation on 03/25/2020 at 08:30 a.m. to provider [facility] , who verbally acknowledged these results. Other nonemergent findings were communicated via the PRA as outlined in the Z vision dash board. Aortic Atherosclerosis (ICD10-I70.0). Electronically Signed   By: Zetta Bills M.D.   On: 03/25/2020 08:58   CT Abdomen Pelvis W Contrast  Result Date: 03/25/2020 CLINICAL DATA:  Non-small cell lung cancer, metastatic assess  treatment response. EXAM: CT CHEST, ABDOMEN, AND PELVIS WITH CONTRAST TECHNIQUE: Multidetector CT imaging of the chest, abdomen and pelvis was performed following the standard protocol during bolus administration of intravenous contrast. CONTRAST:  183mL OMNIPAQUE IOHEXOL 300 MG/ML  SOLN COMPARISON:  Comparison study of 01/10/2020 FINDINGS: CT CHEST FINDINGS Cardiovascular: Heart size moderately enlarged. RV to LV ratio of 0.86. Thoracic aorta is normal caliber with smooth contour. Pulmonary embolus in the RIGHT main pulmonary artery extending into upper lobe lobar level branches and lower lobe lobar level branches. Mediastinum/Nodes: Thoracic inlet structures are normal.  The No axillary lymphadenopathy. No mediastinal lymphadenopathy. No hilar lymphadenopathy. Lungs/Pleura: Marked interval decrease in the number of well-defined nodules in the chest since January 10, 2020. Diminished size of LEFT lower lobe pulmonary mass since the prior study. (Image 94, series 4) 2.7 x 2.6 cm LEFT lower lobe pulmonary nodule previously pulmonary mass measuring approximately 4.7 x 5.1 cm. Signs of septal thickening have developed since the previous study along with ground-glass in the lingula and LEFT lower lobe. (Image 91, series 4) 3.1 x 1.8 cm area of peripheral consolidative change with central lucency in the RIGHT lower lobe along the fissure abutting the pleura. Similar peripheral area of density in the RIGHT upper lobe 1.9 x 1.6 cm (image 36, series 4) the well-defined nodules that were centrally innumerable on the prior study have diminished in number and there are numerous areas of ground-glass nodularity throughout the chest in place of these nodules with more confluent ground-glass as discussed above. There is no pleural effusion. Airways are patent. Musculoskeletal: See below for full musculoskeletal details. 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 New small area of low density in  the LEFT hepatic lobe (image 45, series 2) 13 mm. In hindsight there is very subtle heterogeneity in this area on the prior study, perhaps this represents treated metastatic lesion. There is also a subtle area of low attenuation in the RIGHT hepatic lobe not apparent on the prior study (image 60, series 2) 9 mm. Gallbladder without signs of pericholecystic stranding. No biliary duct dilation. Pancreas: Pancreatic atrophy without ductal dilation, sign of inflammation or focal lesion. Spleen: Spleen normal in size and contour without focal lesion. Adrenals/Urinary Tract: Adrenal glands are normal. Symmetric renal enhancement. No hydronephrosis. Urinary bladder under distended. This limits assessment. Cysts in the RIGHT kidney similar to prior imaging. Stomach/Bowel: No acute gastrointestinal process.  Normal appendix. Vascular/Lymphatic: Patent abdominal vasculature. Scattered atheromatous plaque in the abdominal aorta. No aneurysmal dilation. There  is no gastrohepatic or hepatoduodenal ligament lymphadenopathy. No retroperitoneal or mesenteric lymphadenopathy. No pelvic sidewall lymphadenopathy. Engorgement of RIGHT internal iliac vein and deep pelvic veins along the RIGHT pelvic sidewall. Variable attenuation with some areas suggesting central low attenuation, for instance on image 106 of series 2. Reproductive: Post hysterectomy.  No adnexal mass. Other: No hernia.  No ascites. Musculoskeletal: Metastatic lesions in ribs, RIGHT 6 rib with interval sclerosis and healing, further healing of a pathologic fracture on image 93 of series 4 New subacute fracture with similar appearance and healing at the site of lucency in the posterior ninth rib. Adjacent sclerotic focus similar to the prior study. No acute displaced rib fractures. Subtle sternal sclerotic foci in the sternal manubrium. New sclerotic foci LEFT humeral head just less than a cm size (image 11, series 4) Is lucent area in the RIGHT iliac on the prior study  is now partially sclerotic (image 100, series 2) measuring approximately 2.8 cm. Irregularity of the RIGHT pubic root with sclerosis is similar to the previous exam. (Image 110, series 2) this extends into the acetabulum at this level Numerous new sclerotic foci throughout the pelvis and spine Numerous new sclerotic foci scattered throughout visualized skeleton, for instance on image 117 of series 2 in the proximal LEFT femur. A subtle lucent area seen just posterior to this and inferior to this on image 119 of series 2 is an example of a sclerotic focus that has developed at the site of previous lucency. Further sclerosis at the margin of a sacral metastasis on image 94 of series 2 adjacent to the central canal along the sacrum. Numerous foci of sclerosis have developed in the spine. Is for instance, in hindsight an area of very subtle lucency in the spine at the T7 level shows dense sclerosis (image 101, series 6), nearly all vertebral levels are involved. IMPRESSION: 1. Pulmonary embolus in the RIGHT main pulmonary artery extending into upper lobe lobar level branches and RIGHT lower lobe lobar level branches. Borderline RV to LV ratio nearly 0.9, RIGHT heart with some enlargement on the previous study. This is of uncertain clinical significance but could be seen in the early setting of RIGHT heart strain. Consider correlation with symptoms and echocardiography as warranted given that there is considerable clot in the RIGHT main pulmonary artery and its branches. 2. New areas of nodularity/consolidation in the RIGHT chest more likely related to pulmonary infarcts. Attention on follow-up 3. Marked interval response to therapy in the chest as described. 4. Nearly confluent ground-glass in the LEFT chest may represent post treatment changes given that nodularity elsewhere has transitioned from solid to ground-glass appearance. Disease was more extensive in this area which could explain these findings. Close  attention on follow-up is suggested to exclude the possibility of superimposed pneumonitis. 5. Subtle lesions in the liver not apparent on previous imaging may represent treated, previously radiographically occult disease given other findings on the current study. Attention on follow-up is suggested. 6. Irregularity of the RIGHT pubic root could be at risk for pathologic fracture but is similar to the prior imaging study. 7. Signs of pathologic fracture in ribs with interval healing as described. Numerous new foci of sclerosis involving nearly all visualized bones could represent interval response to therapy of pre-existing occult metastatic foci. Close attention on follow-up is suggested. 8. Engorgement of RIGHT iliac, internal iliac and deep pelvic veins, this may represent the source of the patient's embolus. 9. Aortic atherosclerosis. Critical Value/emergent results were called by telephone  at the time of interpretation on 03/25/2020 at 08:30 a.m. to provider [facility] , who verbally acknowledged these results. Other nonemergent findings were communicated via the PRA as outlined in the Z vision dash board. Aortic Atherosclerosis (ICD10-I70.0). Electronically Signed   By: Zetta Bills M.D.   On: 03/25/2020 08:58   ECHOCARDIOGRAM COMPLETE  Result Date: 03/25/2020    ECHOCARDIOGRAM REPORT   Patient Name:   SHAYLE DONAHOO Date of Exam: 03/25/2020 Medical Rec #:  [id number]     Height:       66.0 in Accession #:    [id number]    Weight:       179.4 lb Date of Birth:  [birth year]     BSA:          1.910 m Patient Age:    28 years      BP:           135/57 mmHg Patient Gender: F             HR:           54 bpm. Exam Location:  Inpatient Procedure: 2D Echo STAT ECHO Indications:    Pulmonary Embolus I26.99  History:        Patient has prior history of Echocardiogram examinations, most                 recent 01/09/2020. Non-ischemic Cardiomyopathy; Risk                 Factors:Hypertension and  Dyslipidemia.  Sonographer:    Mikki Santee RDCS (AE) Referring Phys: [id number] [place]  1. Left ventricular ejection fraction, by estimation, is 55 to 60%. The left ventricle has normal function. The left ventricle has no regional wall motion abnormalities. Left ventricular diastolic parameters are consistent with Grade I diastolic dysfunction (impaired relaxation).  2. Right ventricular systolic function is normal. The right ventricular size is normal. There is normal pulmonary artery systolic pressure. The estimated right ventricular systolic pressure is 35.0 mmHg.  3. The mitral valve is grossly normal. Mild mitral valve regurgitation. No evidence of mitral stenosis.  4. The aortic valve is tricuspid. Aortic valve regurgitation is not visualized. No aortic stenosis is present.  5. The inferior vena cava is normal in size with greater than 50% respiratory variability, suggesting right atrial pressure of 3 mmHg. Comparison(s): No significant change from prior study. FINDINGS  Left Ventricle: Left ventricular ejection fraction, by estimation, is 55 to 60%. The left ventricle has normal function. The left ventricle has no regional wall motion abnormalities. The left ventricular internal cavity size was normal in size. There is  no left ventricular hypertrophy. Left ventricular diastolic parameters are consistent with Grade I diastolic dysfunction (impaired relaxation). Normal left ventricular filling pressure. Right Ventricle: The right ventricular size is normal. No increase in right ventricular wall thickness. Right ventricular systolic function is normal. There is normal pulmonary artery systolic pressure. The tricuspid regurgitant velocity is 1.99 m/s, and  with an assumed right atrial pressure of 3 mmHg, the estimated right ventricular systolic pressure is 09.3 mmHg. Left Atrium: Left atrial size was normal in size. Right Atrium: Right atrial size was normal in size. Pericardium: Trivial  pericardial effusion is present. Presence of pericardial fat pad. Mitral Valve: The mitral valve is grossly normal. Mild mitral valve regurgitation. No evidence of mitral valve stenosis. Tricuspid Valve: The tricuspid valve is grossly normal. Tricuspid valve regurgitation is trivial. No evidence of tricuspid stenosis. Aortic Valve:  The aortic valve is tricuspid. Aortic valve regurgitation is not visualized. No aortic stenosis is present. Pulmonic Valve: The pulmonic valve was grossly normal. Pulmonic valve regurgitation is trivial. No evidence of pulmonic stenosis. Aorta: The aortic root and ascending aorta are structurally normal, with no evidence of dilitation. Venous: The right upper pulmonary vein is normal. The inferior vena cava is normal in size with greater than 50% respiratory variability, suggesting right atrial pressure of 3 mmHg. IAS/Shunts: The atrial septum is grossly normal.  LEFT VENTRICLE PLAX 2D LVIDd:         4.80 cm  Diastology LVIDs:         3.70 cm  LV e' medial:    4.98 cm/s LV PW:         0.90 cm  LV E/e' medial:  14.4 LV IVS:        0.90 cm  LV e' lateral:   6.48 cm/s LVOT diam:     2.20 cm  LV E/e' lateral: 11.1 LV SV:         60 LV SV Index:   31 LVOT Area:     3.80 cm  RIGHT VENTRICLE RV S prime:     10.00 cm/s TAPSE (M-mode): 1.9 cm LEFT ATRIUM             Index       RIGHT ATRIUM           Index LA diam:        3.80 cm 1.99 cm/m  RA Area:     11.00 cm LA Vol (A2C):   74.4 ml 38.96 ml/m RA Volume:   20.00 ml  10.47 ml/m LA Vol (A4C):   57.8 ml 30.27 ml/m LA Biplane Vol: 67.6 ml 35.40 ml/m  AORTIC VALVE LVOT Vmax:   73.10 cm/s LVOT Vmean:  42.900 cm/s LVOT VTI:    0.158 m  AORTA Ao Root diam: 2.70 cm MITRAL VALVE               TRICUSPID VALVE MV Area (PHT): 2.48 cm    TR Peak grad:   15.8 mmHg MV Decel Time: 306 msec    TR Vmax:        199.00 cm/s MR Peak grad: 117.9 mmHg MR Mean grad: 74.0 mmHg    SHUNTS MR Vmax:      543.00 cm/s  Systemic VTI:  0.16 m MR Vmean:     410.0 cm/s    Systemic Diam: 2.20 cm MV E velocity: 71.80 cm/s MV A velocity: 97.50 cm/s MV E/A ratio:  0.74 Eleonore Chiquito MD Electronically signed by Eleonore Chiquito MD Signature Date/Time: 03/25/2020/11:46:59 AM    Final    VAS [place] LOWER EXTREMITY VENOUS (DVT)  Result Date: 03/25/2020  Lower Venous DVT Study Indications: Swelling, and Pain.  Comparison Study: 01/09/20 previous negative Performing Technologist: Abram Sander RVS  Examination Guidelines: A complete evaluation includes B-mode imaging, spectral Doppler, color Doppler, and power Doppler as needed of all accessible portions of each vessel. Bilateral testing is considered an integral part of a complete examination. Limited examinations for reoccurring indications may be performed as noted. The reflux portion of the exam is performed with the patient in reverse Trendelenburg.  +---------+---------------+---------+-----------+----------+--------------+ RIGHT    CompressibilityPhasicitySpontaneityPropertiesThrombus Aging +---------+---------------+---------+-----------+----------+--------------+ CFV      Full           Yes      Yes                                 +---------+---------------+---------+-----------+----------+--------------+  SFJ      Full                                                        +---------+---------------+---------+-----------+----------+--------------+ FV Prox  Full                                                        +---------+---------------+---------+-----------+----------+--------------+ FV Mid   Full                                                        +---------+---------------+---------+-----------+----------+--------------+ FV DistalFull                                                        +---------+---------------+---------+-----------+----------+--------------+ PFV      Full                                                         +---------+---------------+---------+-----------+----------+--------------+ POP      Full           Yes      Yes                                 +---------+---------------+---------+-----------+----------+--------------+ PTV      Full                                                        +---------+---------------+---------+-----------+----------+--------------+ PERO     Full                                                        +---------+---------------+---------+-----------+----------+--------------+   +---------+---------------+---------+-----------+----------+-------------------+ LEFT     CompressibilityPhasicitySpontaneityPropertiesThrombus Aging      +---------+---------------+---------+-----------+----------+-------------------+ CFV      None           No       No                   Age Indeterminate   +---------+---------------+---------+-----------+----------+-------------------+ SFJ      Full                                                             +---------+---------------+---------+-----------+----------+-------------------+  FV Prox  None                                         Age Indeterminate   +---------+---------------+---------+-----------+----------+-------------------+ FV Mid   None                                         Age Indeterminate   +---------+---------------+---------+-----------+----------+-------------------+ FV DistalNone                                         Age Indeterminate   +---------+---------------+---------+-----------+----------+-------------------+ PFV      None                                         Age Indeterminate   +---------+---------------+---------+-----------+----------+-------------------+ POP      None           Yes      Yes                  Age Indeterminate   +---------+---------------+---------+-----------+----------+-------------------+ PTV      Full                                                              +---------+---------------+---------+-----------+----------+-------------------+ PERO                                                  Not well visualized +---------+---------------+---------+-----------+----------+-------------------+ IIV                     Yes      Yes                                      +---------+---------------+---------+-----------+----------+-------------------+    Summary: RIGHT: - There is no evidence of deep vein thrombosis in the lower extremity.  - No cystic structure found in the popliteal fossa.  LEFT: - Findings consistent with age indeterminate deep vein thrombosis involving the left common femoral vein, left proximal profunda vein, left popliteal vein, and left femoral vein.  *See table(s) above for measurements and observations. Electronically signed by Deitra Mayo MD on 03/25/2020 at 3:51:32 PM.    Final         Scheduled Meds: . apixaban  10 mg Oral BID   Followed by  . [START ON 04/01/2020] apixaban  5 mg Oral BID  . atorvastatin  10 mg Oral q1800  . FLUoxetine  10 mg Oral Daily  . levETIRAcetam  500 mg Oral BID  . levothyroxine  75 mcg Oral Daily  . osimertinib mesylate  160 mg Oral Daily  . pantoprazole  40 mg Oral Q1200   Continuous Infusions:   LOS:  0 days     Hosie Poisson, MD [facility]   To contact the attending provider between 7A-7P or the covering provider during after hours 7P-7A, please log into the web site [email or web address] and access using universal  password for that web site. If you do not have the password, please call the hospital operator.  03/26/2020, 3:48 PM

## 2020-03-27 DIAGNOSIS — E039 Hypothyroidism, unspecified: Secondary | ICD-10-CM | POA: Diagnosis not present

## 2020-03-27 DIAGNOSIS — I2699 Other pulmonary embolism without acute cor pulmonale: Secondary | ICD-10-CM | POA: Diagnosis not present

## 2020-03-27 DIAGNOSIS — I10 Essential (primary) hypertension: Secondary | ICD-10-CM | POA: Diagnosis not present

## 2020-03-27 DIAGNOSIS — C7949 Secondary malignant neoplasm of other parts of nervous system: Secondary | ICD-10-CM | POA: Diagnosis not present

## 2020-03-27 DIAGNOSIS — K219 Gastro-esophageal reflux disease without esophagitis: Secondary | ICD-10-CM | POA: Diagnosis not present

## 2020-03-27 DIAGNOSIS — C7931 Secondary malignant neoplasm of brain: Secondary | ICD-10-CM | POA: Diagnosis not present

## 2020-03-27 DIAGNOSIS — C3492 Malignant neoplasm of unspecified part of left bronchus or lung: Secondary | ICD-10-CM | POA: Diagnosis not present

## 2020-03-27 LAB — CBC
HCT: 39 % (ref 36.0–46.0)
Hemoglobin: 12.3 g/dL (ref 12.0–15.0)
MCH: 28.6 pg (ref 26.0–34.0)
MCHC: 31.5 g/dL (ref 30.0–36.0)
MCV: 90.7 fL (ref 80.0–100.0)
Platelets: 116 10*3/uL — ABNORMAL LOW (ref 150–400)
RBC: 4.3 MIL/uL (ref 3.87–5.11)
RDW: 17.1 % — ABNORMAL HIGH (ref 11.5–15.5)
WBC: 3.7 10*3/uL — ABNORMAL LOW (ref 4.0–10.5)
nRBC: 0 % (ref 0.0–0.2)

## 2020-03-27 MED ORDER — APIXABAN 5 MG PO TABS
5.0000 mg | ORAL_TABLET | Freq: Two times a day (BID) | ORAL | 1 refills | Status: DC
Start: 2020-04-01 — End: 2020-05-30

## 2020-03-27 MED ORDER — APIXABAN 5 MG PO TABS
10.0000 mg | ORAL_TABLET | Freq: Two times a day (BID) | ORAL | 0 refills | Status: DC
Start: 2020-03-27 — End: 2020-04-13

## 2020-03-27 NOTE — Evaluation (Signed)
Physical Therapy Evaluation Patient Details Name: Stefanie Camacho MRN: [id number] DOB: Sep 29, 1945 Today's Date: 03/27/2020   History of Present Illness  74 year old lady prior history of lung cancer with brain mets, hypertension, GERD, hypothyroidism, nonischemic cardiomyopathy with left ventricular ejection fraction of 45% in 2019, sleep apnea, hyperlipidemia had a CT staging of her lung cancer at which time she was found to have a pulmonary embolism and was referred to ED for further evaluation.  Clinical Impression  Pt scheduled to d/c home today. She has good family support.  She would benefit from HHPT. Family was working on getting this started with [facility], but they didn't know where it was in the process.  Pt given home exercise program and gait belt as well. Will d/c from PT. If pt's d/c gets canceled for some reason, please re-order PT. Thank you.    Follow Up Recommendations Home health PT;Supervision/Assistance - 24 hour    Equipment Recommendations       Recommendations for Other Services       Precautions / Restrictions Precautions Precautions: Other (comment) Precaution Comments: monitor o2 Restrictions Weight Bearing Restrictions: No      Mobility  Bed Mobility Overal bed mobility: Needs Assistance Bed Mobility: Supine to Sit     Supine to sit: Supervision;HOB elevated     General bed mobility comments: S to get to EOB with increased time    Transfers Overall transfer level: Needs assistance Equipment used: Rolling walker (2 wheeled) Transfers: Sit to/from Stand Sit to Stand: Min guard         General transfer comment: MIN/guard with cues for safety  Ambulation/Gait Ambulation/Gait assistance: Min guard Gait Distance (Feet): 28 Feet Assistive device: Rolling walker (2 wheeled) Gait Pattern/deviations: Step-through pattern;Decreased step length - right;Decreased step length - left;Trunk flexed Gait velocity: decreased   General Gait Details: Amb  with o2 at 1 L/min with O2 > 90%, but pt fatigued, reliant on Rw for support  Stairs            Wheelchair Mobility    Modified Rankin (Stroke Patients Only)       Balance Overall balance assessment: Needs assistance           Standing balance-Leahy Scale: Poor Standing balance comment: requires RW                             Pertinent Vitals/Pain Pain Assessment: No/denies pain    Home Living Family/patient expects to be discharged to:: Private residence Living Arrangements: Alone   Type of Home: House Home Access: Stairs to enter Entrance Stairs-Rails: None Entrance Stairs-Number of Steps: 1/1 Home Layout: One level Home Equipment: Environmental consultant - 2 wheels;Shower seat;Other (comment);Transport chair (elevated commode seat and O2) Additional Comments: Has someone with her close to 24 hours/day. an occasional afternoon where she may not have someone ther, but for the most part 24/7    Prior Function Level of Independence: Independent with assistive device(s)         Comments: RW     Hand Dominance        Extremity/Trunk Assessment   Upper Extremity Assessment Upper Extremity Assessment: Generalized weakness    Lower Extremity Assessment Lower Extremity Assessment: Generalized weakness    Cervical / Trunk Assessment Cervical / Trunk Assessment: Normal  Communication      Cognition Arousal/Alertness: Awake/alert Behavior During Therapy: WFL for tasks assessed/performed Overall Cognitive Status: Within Functional Limits for tasks assessed  General Comments General comments (skin integrity, edema, etc.): Pt given handout on exercises she can do at home for strengthening.  reviewed with pt and daughter.    Exercises     Assessment/Plan    PT Assessment All further PT needs can be met in the next venue of care  PT Problem List Decreased strength;Decreased activity  tolerance;Decreased balance;Decreased knowledge of use of DME;Decreased mobility;Cardiopulmonary status limiting activity       PT Treatment Interventions      PT Goals (Current goals can be found in the Care Plan section)  Acute Rehab PT Goals Patient Stated Goal: Keep moving PT Goal Formulation: All assessment and education complete, DC therapy    Frequency     Barriers to discharge        Co-evaluation               AM-PAC PT "6 Clicks" Mobility  Outcome Measure Help needed turning from your back to your side while in a flat bed without using bedrails?: A Little Help needed moving from lying on your back to sitting on the side of a flat bed without using bedrails?: A Little Help needed moving to and from a bed to a chair (including a wheelchair)?: A Little Help needed standing up from a chair using your arms (e.g., wheelchair or bedside chair)?: A Little Help needed to walk in hospital room?: A Little Help needed climbing 3-5 steps with a railing? : A Lot 6 Click Score: 17    End of Session Equipment Utilized During Treatment: Gait belt;Oxygen Activity Tolerance: Patient tolerated treatment well;Patient limited by fatigue Patient left: in chair;with call bell/phone within reach;with family/visitor present Nurse Communication: Mobility status PT Visit Diagnosis: Difficulty in walking, not elsewhere classified (R26.2);Muscle weakness (generalized) (M62.81)    Time: 7209-4709 PT Time Calculation (min) (ACUTE ONLY): 35 min   Charges:   PT Evaluation $PT Eval Low Complexity: 1 Low PT Treatments $Gait Training: 8-22 mins        Jadden Yim L. Tamala Julian, [place] Pager [phone] 03/27/2020   Galen Manila 03/27/2020, 11:59 AM

## 2020-03-27 NOTE — Progress Notes (Signed)
AVS given to patient and explained at the bedside. Medications and follow up appointments have been explained with pt verbalizing understanding. Per family, they are working with [facility] and Dr. Julien Nordmann currently to set up home health.

## 2020-03-28 ENCOUNTER — Other Ambulatory Visit (HOSPITAL_COMMUNITY): Payer: Medicare HMO

## 2020-03-28 NOTE — Discharge Summary (Signed)
Physician Discharge Summary  Stefanie Camacho SEG:[id number] DOB: [birth year] DOA: 03/25/2020  PCP: [name], MD  Admit date: 03/25/2020 Discharge date: 03/27/2020  Admitted From: Home.  Disposition:  Home.  Recommendations for Outpatient Follow-up:  1. Follow up with PCP in 1-2 weeks 2. Please obtain BMP/CBC in one week 3. Please follow up with oncology as recommended.    Home Health:YES  Discharge Condition:STABLE.  CODE STATUS:FULL CODE.  Diet recommendation: Heart Healthy   Brief/Interim Summary:  74 year old lady prior history of lung cancer with brain mets, hypertension, GERD, hypothyroidism, nonischemic cardiomyopathy with left ventricular ejection fraction of 45% in 2019, sleep apnea, hyperlipidemia had a CT staging of her lung cancer at which time she was found to have a pulmonary embolism and was referred to ED for further evaluation.  She is currently on 1 to 2 L of nasal cannula oxygen to keep sats greater than 90%.  On arrival to ED PCCM was consulted to see if she is a candidate for mechanical thrombectomy.  Echocardiogram did not show any RV strain and she is not a candidate for thrombectomy.  She was started on Eliquis at this time.  And referred to [facility] for admission. She underwent venous duplex of the lower extremity showing DVT in the left common femoral vein, left femoral vein, left popliteal vein.  She was discharged on oral eliquis as her hemoglobin remained stable. She worked with PT and discharged home with Home health PT.    Discharge Diagnoses:  Active Problems:   Nonischemic cardiomyopathy (HCC)   Essential hypertension   Hypothyroidism   GERD (gastroesophageal reflux disease)   Anxiety   Malignant neoplasm of lower lobe of left lung (HCC)   Adenocarcinoma of left lung, stage 4 (HCC)   Bone metastasis (HCC)   Brain metastases (HCC)   Leptomeningeal metastases (HCC)   Pulmonary embolism (HCC)   Acute pulmonary embolism lower extremity DVT on the  left She was started on Eliquis and she is at risk for intracranial bleed given brain mets.  Risk and benefits of medication discussed with the patient.  Patient is not a candidate for catheter lysis and discussed with IR and patient is not a candidate for mechanical thrombectomy at this time. Echocardiogram are reassuring, no RV strain at this time currently requiring up to 2 L of nasal cannula oxygen.  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 Lisbeth Renshaw. Currently on Keppra for seizure prophylaxis.   Hypothyroidism Continue with Synthroid at this time.   Essential hypertension Blood pressure parameters are optimal at this time.  Thrombocytopenia Watch for signs of bleeding at this time as patient is on Eliquis and has brain mets.   Mild hyponatremia Continue to monitor.   History of nonischemic cardiomyopathy Patient currently denies any chest pain.    Hyperlipidemia Continue with Lipitor.    GERD Stable continue with Protonix Discharge Instructions  Discharge Instructions    Diet - low sodium heart healthy   Complete by: As directed    Discharge instructions   Complete by: As directed    Please follow up with oncology in one week.  Watch for bleeding as you are started on blood thinners.     Allergies as of 03/27/2020   No Known Allergies     Medication List    STOP taking these medications   aspirin EC 81 MG tablet  dexamethasone 4 MG tablet Commonly known as: DECADRON   methylPREDNISolone 4 MG Tbpk tablet Commonly known as: MEDROL DOSEPAK   oxyCODONE-acetaminophen 5-325 MG tablet Commonly known as: PERCOCET/ROXICET     TAKE these medications   acetaminophen 500 MG tablet Commonly known as: TYLENOL Take 1,000 mg by mouth every 6 (six) hours as needed for mild pain.   apixaban 5  MG Tabs tablet Commonly known as: ELIQUIS Take 2 tablets (10 mg total) by mouth 2 (two) times daily for 4 days.   apixaban 5 MG Tabs tablet Commonly known as: ELIQUIS Take 1 tablet (5 mg total) by mouth 2 (two) times daily. Start taking on: April 01, 2020   atorvastatin 10 MG tablet Commonly known as: LIPITOR Take 1 tablet (10 mg total) by mouth daily at 6 PM.   FLUoxetine 10 MG tablet Commonly known as: PROZAC Take 1 tablet (10 mg total) by mouth daily.   levETIRAcetam 500 MG tablet Commonly known as: KEPPRA Take 1 tablet (500 mg total) by mouth 2 (two) times daily.   levothyroxine 75 MCG tablet Commonly known as: SYNTHROID Take 1 tablet (75 mcg total) by mouth daily.   loperamide 2 MG capsule Commonly known as: IMODIUM Take 2 mg by mouth as needed for diarrhea or loose stools.   losartan 50 MG tablet Commonly known as: COZAAR Take 1 tablet (50 mg total) by mouth daily.   metoprolol succinate 50 MG 24 hr tablet Commonly known as: TOPROL-XL Take 1 tablet (50 mg total) by mouth daily. Take with or immediately following a meal.   osimertinib mesylate 80 MG tablet Commonly known as: TAGRISSO Take 2 tablets (160 mg total) by mouth daily. Days 1-28.   pantoprazole 40 MG tablet Commonly known as: PROTONIX Take 1 tablet (40 mg total) by mouth daily.   prochlorperazine 10 MG tablet Commonly known as: COMPAZINE Take 1 tablet (10 mg total) by mouth every 6 (six) hours as needed for nausea or vomiting.   spironolactone 25 MG tablet Commonly known as: ALDACTONE Take 0.5 tablets (12.5 mg total) by mouth daily.       Follow-up Information    [name], MD. Schedule an appointment as soon as possible for a visit in 1 week(s).   Specialty: Family Medicine Contact information: [place] [address] [phone]        Park Liter, MD .   Specialty: Cardiology Contact information: [place]  [address] [phone]              No Known Allergies  Consultations: PCCM   Procedures/Studies: CT Chest W Contrast  Result Date: 03/25/2020 CLINICAL DATA:  Non-small cell lung cancer, metastatic assess treatment response. EXAM: CT CHEST, ABDOMEN, AND PELVIS WITH CONTRAST TECHNIQUE: Multidetector CT imaging of the chest, abdomen and pelvis was performed following the standard protocol during bolus administration of intravenous contrast. CONTRAST:  145mL OMNIPAQUE IOHEXOL 300 MG/ML  SOLN COMPARISON:  Comparison study of 01/10/2020 FINDINGS: CT CHEST FINDINGS Cardiovascular: Heart size moderately enlarged. RV to LV ratio of 0.86. Thoracic aorta is normal caliber with smooth contour. Pulmonary embolus in the RIGHT main pulmonary artery extending into upper lobe lobar level branches and lower lobe lobar level branches. Mediastinum/Nodes: Thoracic inlet structures are normal.  The No axillary lymphadenopathy. No mediastinal lymphadenopathy. No hilar lymphadenopathy. Lungs/Pleura: Marked interval decrease in the number of well-defined nodules in the chest since January 10, 2020. Diminished size of LEFT lower lobe pulmonary  mass since the prior study. (Image 94, series 4) 2.7 x 2.6 cm LEFT lower lobe pulmonary nodule previously pulmonary mass measuring approximately 4.7 x 5.1 cm. Signs of septal thickening have developed since the previous study along with ground-glass in the lingula and LEFT lower lobe. (Image 91, series 4) 3.1 x 1.8 cm area of peripheral consolidative change with central lucency in the RIGHT lower lobe along the fissure abutting the pleura. Similar peripheral area of density in the RIGHT upper lobe 1.9 x 1.6 cm (image 36, series 4) the well-defined nodules that were centrally innumerable on the prior study have diminished in number and there are numerous areas of ground-glass nodularity throughout the chest in place of these nodules with more confluent ground-glass as discussed above.  There is no pleural effusion. Airways are patent. Musculoskeletal: See below for full musculoskeletal details. 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 New small area of low density in the LEFT hepatic lobe (image 45, series 2) 13 mm. In hindsight there is very subtle heterogeneity in this area on the prior study, perhaps this represents treated metastatic lesion. There is also a subtle area of low attenuation in the RIGHT hepatic lobe not apparent on the prior study (image 60, series 2) 9 mm. Gallbladder without signs of pericholecystic stranding. No biliary duct dilation. Pancreas: Pancreatic atrophy without ductal dilation, sign of inflammation or focal lesion. Spleen: Spleen normal in size and contour without focal lesion. Adrenals/Urinary Tract: Adrenal glands are normal. Symmetric renal enhancement. No hydronephrosis. Urinary bladder under distended. This limits assessment. Cysts in the RIGHT kidney similar to prior imaging. Stomach/Bowel: No acute gastrointestinal process.  Normal appendix. Vascular/Lymphatic: Patent abdominal vasculature. Scattered atheromatous plaque in the abdominal aorta. No aneurysmal dilation. There is no gastrohepatic or hepatoduodenal ligament lymphadenopathy. No retroperitoneal or mesenteric lymphadenopathy. No pelvic sidewall lymphadenopathy. Engorgement of RIGHT internal iliac vein and deep pelvic veins along the RIGHT pelvic sidewall. Variable attenuation with some areas suggesting central low attenuation, for instance on image 106 of series 2. Reproductive: Post hysterectomy.  No adnexal mass. Other: No hernia.  No ascites. Musculoskeletal: Metastatic lesions in ribs, RIGHT 6 rib with interval sclerosis and healing, further healing of a pathologic fracture on image 93 of series 4 New subacute fracture with similar appearance and healing at the site of lucency in the posterior ninth rib. Adjacent sclerotic focus similar to the prior study. No  acute displaced rib fractures. Subtle sternal sclerotic foci in the sternal manubrium. New sclerotic foci LEFT humeral head just less than a cm size (image 11, series 4) Is lucent area in the RIGHT iliac on the prior study is now partially sclerotic (image 100, series 2) measuring approximately 2.8 cm. Irregularity of the RIGHT pubic root with sclerosis is similar to the previous exam. (Image 110, series 2) this extends into the acetabulum at this level Numerous new sclerotic foci throughout the pelvis and spine Numerous new sclerotic foci scattered throughout visualized skeleton, for instance on image 117 of series 2 in the proximal LEFT femur. A subtle lucent area seen just posterior to this and inferior to this on image 119 of series 2 is an example of a sclerotic focus that has developed at the site of previous lucency. Further sclerosis at the margin of a sacral metastasis on image 94 of series 2 adjacent to the central canal along the sacrum. Numerous foci of sclerosis have developed in the spine. Is for instance, in hindsight an area of very subtle lucency in  the spine at the T7 level shows dense sclerosis (image 101, series 6), nearly all vertebral levels are involved. IMPRESSION: 1. Pulmonary embolus in the RIGHT main pulmonary artery extending into upper lobe lobar level branches and RIGHT lower lobe lobar level branches. Borderline RV to LV ratio nearly 0.9, RIGHT heart with some enlargement on the previous study. This is of uncertain clinical significance but could be seen in the early setting of RIGHT heart strain. Consider correlation with symptoms and echocardiography as warranted given that there is considerable clot in the RIGHT main pulmonary artery and its branches. 2. New areas of nodularity/consolidation in the RIGHT chest more likely related to pulmonary infarcts. Attention on follow-up 3. Marked interval response to therapy in the chest as described. 4. Nearly confluent ground-glass in the  LEFT chest may represent post treatment changes given that nodularity elsewhere has transitioned from solid to ground-glass appearance. Disease was more extensive in this area which could explain these findings. Close attention on follow-up is suggested to exclude the possibility of superimposed pneumonitis. 5. Subtle lesions in the liver not apparent on previous imaging may represent treated, previously radiographically occult disease given other findings on the current study. Attention on follow-up is suggested. 6. Irregularity of the RIGHT pubic root could be at risk for pathologic fracture but is similar to the prior imaging study. 7. Signs of pathologic fracture in ribs with interval healing as described. Numerous new foci of sclerosis involving nearly all visualized bones could represent interval response to therapy of pre-existing occult metastatic foci. Close attention on follow-up is suggested. 8. Engorgement of RIGHT iliac, internal iliac and deep pelvic veins, this may represent the source of the patient's embolus. 9. Aortic atherosclerosis. Critical Value/emergent results were called by telephone at the time of interpretation on 03/25/2020 at 08:30 a.m. to provider [facility] , who verbally acknowledged these results. Other nonemergent findings were communicated via the PRA as outlined in the Z vision dash board. Aortic Atherosclerosis (ICD10-I70.0). Electronically Signed   By: Zetta Bills M.D.   On: 03/25/2020 08:58   CT Abdomen Pelvis W Contrast  Result Date: 03/25/2020 CLINICAL DATA:  Non-small cell lung cancer, metastatic assess treatment response. EXAM: CT CHEST, ABDOMEN, AND PELVIS WITH CONTRAST TECHNIQUE: Multidetector CT imaging of the chest, abdomen and pelvis was performed following the standard protocol during bolus administration of intravenous contrast. CONTRAST:  135mL OMNIPAQUE IOHEXOL 300 MG/ML  SOLN COMPARISON:  Comparison study of 01/10/2020 FINDINGS: CT CHEST  FINDINGS Cardiovascular: Heart size moderately enlarged. RV to LV ratio of 0.86. Thoracic aorta is normal caliber with smooth contour. Pulmonary embolus in the RIGHT main pulmonary artery extending into upper lobe lobar level branches and lower lobe lobar level branches. Mediastinum/Nodes: Thoracic inlet structures are normal.  The No axillary lymphadenopathy. No mediastinal lymphadenopathy. No hilar lymphadenopathy. Lungs/Pleura: Marked interval decrease in the number of well-defined nodules in the chest since January 10, 2020. Diminished size of LEFT lower lobe pulmonary mass since the prior study. (Image 94, series 4) 2.7 x 2.6 cm LEFT lower lobe pulmonary nodule previously pulmonary mass measuring approximately 4.7 x 5.1 cm. Signs of septal thickening have developed since the previous study along with ground-glass in the lingula and LEFT lower lobe. (Image 91, series 4) 3.1 x 1.8 cm area of peripheral consolidative change with central lucency in the RIGHT lower lobe along the fissure abutting the pleura. Similar peripheral area of density in the RIGHT upper lobe 1.9 x 1.6 cm (image 36, series 4) the well-defined  nodules that were centrally innumerable on the prior study have diminished in number and there are numerous areas of ground-glass nodularity throughout the chest in place of these nodules with more confluent ground-glass as discussed above. There is no pleural effusion. Airways are patent. Musculoskeletal: See below for full musculoskeletal details. 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 New small area of low density in the LEFT hepatic lobe (image 45, series 2) 13 mm. In hindsight there is very subtle heterogeneity in this area on the prior study, perhaps this represents treated metastatic lesion. There is also a subtle area of low attenuation in the RIGHT hepatic lobe not apparent on the prior study (image 60, series 2) 9 mm. Gallbladder without signs of  pericholecystic stranding. No biliary duct dilation. Pancreas: Pancreatic atrophy without ductal dilation, sign of inflammation or focal lesion. Spleen: Spleen normal in size and contour without focal lesion. Adrenals/Urinary Tract: Adrenal glands are normal. Symmetric renal enhancement. No hydronephrosis. Urinary bladder under distended. This limits assessment. Cysts in the RIGHT kidney similar to prior imaging. Stomach/Bowel: No acute gastrointestinal process.  Normal appendix. Vascular/Lymphatic: Patent abdominal vasculature. Scattered atheromatous plaque in the abdominal aorta. No aneurysmal dilation. There is no gastrohepatic or hepatoduodenal ligament lymphadenopathy. No retroperitoneal or mesenteric lymphadenopathy. No pelvic sidewall lymphadenopathy. Engorgement of RIGHT internal iliac vein and deep pelvic veins along the RIGHT pelvic sidewall. Variable attenuation with some areas suggesting central low attenuation, for instance on image 106 of series 2. Reproductive: Post hysterectomy.  No adnexal mass. Other: No hernia.  No ascites. Musculoskeletal: Metastatic lesions in ribs, RIGHT 6 rib with interval sclerosis and healing, further healing of a pathologic fracture on image 93 of series 4 New subacute fracture with similar appearance and healing at the site of lucency in the posterior ninth rib. Adjacent sclerotic focus similar to the prior study. No acute displaced rib fractures. Subtle sternal sclerotic foci in the sternal manubrium. New sclerotic foci LEFT humeral head just less than a cm size (image 11, series 4) Is lucent area in the RIGHT iliac on the prior study is now partially sclerotic (image 100, series 2) measuring approximately 2.8 cm. Irregularity of the RIGHT pubic root with sclerosis is similar to the previous exam. (Image 110, series 2) this extends into the acetabulum at this level Numerous new sclerotic foci throughout the pelvis and spine Numerous new sclerotic foci scattered throughout  visualized skeleton, for instance on image 117 of series 2 in the proximal LEFT femur. A subtle lucent area seen just posterior to this and inferior to this on image 119 of series 2 is an example of a sclerotic focus that has developed at the site of previous lucency. Further sclerosis at the margin of a sacral metastasis on image 94 of series 2 adjacent to the central canal along the sacrum. Numerous foci of sclerosis have developed in the spine. Is for instance, in hindsight an area of very subtle lucency in the spine at the T7 level shows dense sclerosis (image 101, series 6), nearly all vertebral levels are involved. IMPRESSION: 1. Pulmonary embolus in the RIGHT main pulmonary artery extending into upper lobe lobar level branches and RIGHT lower lobe lobar level branches. Borderline RV to LV ratio nearly 0.9, RIGHT heart with some enlargement on the previous study. This is of uncertain clinical significance but could be seen in the early setting of RIGHT heart strain. Consider correlation with symptoms and echocardiography as warranted given that there is considerable clot in the RIGHT  main pulmonary artery and its branches. 2. New areas of nodularity/consolidation in the RIGHT chest more likely related to pulmonary infarcts. Attention on follow-up 3. Marked interval response to therapy in the chest as described. 4. Nearly confluent ground-glass in the LEFT chest may represent post treatment changes given that nodularity elsewhere has transitioned from solid to ground-glass appearance. Disease was more extensive in this area which could explain these findings. Close attention on follow-up is suggested to exclude the possibility of superimposed pneumonitis. 5. Subtle lesions in the liver not apparent on previous imaging may represent treated, previously radiographically occult disease given other findings on the current study. Attention on follow-up is suggested. 6. Irregularity of the RIGHT pubic root could be  at risk for pathologic fracture but is similar to the prior imaging study. 7. Signs of pathologic fracture in ribs with interval healing as described. Numerous new foci of sclerosis involving nearly all visualized bones could represent interval response to therapy of pre-existing occult metastatic foci. Close attention on follow-up is suggested. 8. Engorgement of RIGHT iliac, internal iliac and deep pelvic veins, this may represent the source of the patient's embolus. 9. Aortic atherosclerosis. Critical Value/emergent results were called by telephone at the time of interpretation on 03/25/2020 at 08:30 a.m. to provider [facility] , who verbally acknowledged these results. Other nonemergent findings were communicated via the PRA as outlined in the [employer] vision dash board. Aortic Atherosclerosis (ICD10-I70.0). Electronically Signed   By: Zetta Bills M.D.   On: 03/25/2020 08:58   ECHOCARDIOGRAM COMPLETE  Result Date: 03/25/2020    ECHOCARDIOGRAM REPORT   Patient Name:   Stefanie Camacho Date of Exam: 03/25/2020 Medical Rec #:  [id number]     Height:       66.0 in Accession #:    [id number]    Weight:       179.4 lb Date of Birth:  1946/01/26     BSA:          1.910 m Patient Age:    28 years      BP:           135/57 mmHg Patient Gender: F             HR:           54 bpm. Exam Location:  Inpatient Procedure: 2D Echo STAT ECHO Indications:    Pulmonary Embolus I26.99  History:        Patient has prior history of Echocardiogram examinations, most                 recent 01/09/2020. Non-ischemic Cardiomyopathy; Risk                 Factors:Hypertension and Dyslipidemia.  Sonographer:    Mikki Santee RDCS (AE) Referring Phys: [id number] [place]  1. Left ventricular ejection fraction, by estimation, is 55 to 60%. The left ventricle has normal function. The left ventricle has no regional wall motion abnormalities. Left ventricular diastolic parameters are consistent with Grade I diastolic  dysfunction (impaired relaxation).  2. Right ventricular systolic function is normal. The right ventricular size is normal. There is normal pulmonary artery systolic pressure. The estimated right ventricular systolic pressure is 07.3 mmHg.  3. The mitral valve is grossly normal. Mild mitral valve regurgitation. No evidence of mitral stenosis.  4. The aortic valve is tricuspid. Aortic valve regurgitation is not visualized. No aortic stenosis is present.  5. The inferior vena cava is normal in size with  greater than 50% respiratory variability, suggesting right atrial pressure of 3 mmHg. Comparison(s): No significant change from prior study. FINDINGS  Left Ventricle: Left ventricular ejection fraction, by estimation, is 55 to 60%. The left ventricle has normal function. The left ventricle has no regional wall motion abnormalities. The left ventricular internal cavity size was normal in size. There is  no left ventricular hypertrophy. Left ventricular diastolic parameters are consistent with Grade I diastolic dysfunction (impaired relaxation). Normal left ventricular filling pressure. Right Ventricle: The right ventricular size is normal. No increase in right ventricular wall thickness. Right ventricular systolic function is normal. There is normal pulmonary artery systolic pressure. The tricuspid regurgitant velocity is 1.99 m/s, and  with an assumed right atrial pressure of 3 mmHg, the estimated right ventricular systolic pressure is 47.8 mmHg. Left Atrium: Left atrial size was normal in size. Right Atrium: Right atrial size was normal in size. Pericardium: Trivial pericardial effusion is present. Presence of pericardial fat pad. Mitral Valve: The mitral valve is grossly normal. Mild mitral valve regurgitation. No evidence of mitral valve stenosis. Tricuspid Valve: The tricuspid valve is grossly normal. Tricuspid valve regurgitation is trivial. No evidence of tricuspid stenosis. Aortic Valve: The aortic valve is  tricuspid. Aortic valve regurgitation is not visualized. No aortic stenosis is present. Pulmonic Valve: The pulmonic valve was grossly normal. Pulmonic valve regurgitation is trivial. No evidence of pulmonic stenosis. Aorta: The aortic root and ascending aorta are structurally normal, with no evidence of dilitation. Venous: The right upper pulmonary vein is normal. The inferior vena cava is normal in size with greater than 50% respiratory variability, suggesting right atrial pressure of 3 mmHg. IAS/Shunts: The atrial septum is grossly normal.  LEFT VENTRICLE PLAX 2D LVIDd:         4.80 cm  Diastology LVIDs:         3.70 cm  LV e' medial:    4.98 cm/s LV PW:         0.90 cm  LV E/e' medial:  14.4 LV IVS:        0.90 cm  LV e' lateral:   6.48 cm/s LVOT diam:     2.20 cm  LV E/e' lateral: 11.1 LV SV:         60 LV SV Index:   31 LVOT Area:     3.80 cm  RIGHT VENTRICLE RV S prime:     10.00 cm/s TAPSE (M-mode): 1.9 cm LEFT ATRIUM             Index       RIGHT ATRIUM           Index LA diam:        3.80 cm 1.99 cm/m  RA Area:     11.00 cm LA Vol (A2C):   74.4 ml 38.96 ml/m RA Volume:   20.00 ml  10.47 ml/m LA Vol (A4C):   57.8 ml 30.27 ml/m LA Biplane Vol: 67.6 ml 35.40 ml/m  AORTIC VALVE LVOT Vmax:   73.10 cm/s LVOT Vmean:  42.900 cm/s LVOT VTI:    0.158 m  AORTA Ao Root diam: 2.70 cm MITRAL VALVE               TRICUSPID VALVE MV Area (PHT): 2.48 cm    TR Peak grad:   15.8 mmHg MV Decel Time: 306 msec    TR Vmax:        199.00 cm/s MR Peak grad: 117.9 mmHg MR Mean grad: 74.0 mmHg  SHUNTS MR Vmax:      543.00 cm/s  Systemic VTI:  0.16 m MR Vmean:     410.0 cm/s   Systemic Diam: 2.20 cm MV E velocity: 71.80 cm/s MV A velocity: 97.50 cm/s MV E/A ratio:  0.74 Eleonore Chiquito MD Electronically signed by Eleonore Chiquito MD Signature Date/Time: 03/25/2020/11:46:59 AM    Final    VAS [place] LOWER EXTREMITY VENOUS (DVT)  Result Date: 03/25/2020  Lower Venous DVT Study Indications: Swelling, and Pain.  Comparison Study:  01/09/20 previous negative Performing Technologist: Abram Sander RVS  Examination Guidelines: A complete evaluation includes B-mode imaging, spectral Doppler, color Doppler, and power Doppler as needed of all accessible portions of each vessel. Bilateral testing is considered an integral part of a complete examination. Limited examinations for reoccurring indications may be performed as noted. The reflux portion of the exam is performed with the patient in reverse Trendelenburg.  +---------+---------------+---------+-----------+----------+--------------+ RIGHT    CompressibilityPhasicitySpontaneityPropertiesThrombus Aging +---------+---------------+---------+-----------+----------+--------------+ CFV      Full           Yes      Yes                                 +---------+---------------+---------+-----------+----------+--------------+ SFJ      Full                                                        +---------+---------------+---------+-----------+----------+--------------+ FV Prox  Full                                                        +---------+---------------+---------+-----------+----------+--------------+ FV Mid   Full                                                        +---------+---------------+---------+-----------+----------+--------------+ FV DistalFull                                                        +---------+---------------+---------+-----------+----------+--------------+ PFV      Full                                                        +---------+---------------+---------+-----------+----------+--------------+ POP      Full           Yes      Yes                                 +---------+---------------+---------+-----------+----------+--------------+ PTV      Full                                                        +---------+---------------+---------+-----------+----------+--------------+  PERO     Full                                                         +---------+---------------+---------+-----------+----------+--------------+   +---------+---------------+---------+-----------+----------+-------------------+ LEFT     CompressibilityPhasicitySpontaneityPropertiesThrombus Aging      +---------+---------------+---------+-----------+----------+-------------------+ CFV      None           No       No                   Age Indeterminate   +---------+---------------+---------+-----------+----------+-------------------+ SFJ      Full                                                             +---------+---------------+---------+-----------+----------+-------------------+ FV Prox  None                                         Age Indeterminate   +---------+---------------+---------+-----------+----------+-------------------+ FV Mid   None                                         Age Indeterminate   +---------+---------------+---------+-----------+----------+-------------------+ FV DistalNone                                         Age Indeterminate   +---------+---------------+---------+-----------+----------+-------------------+ PFV      None                                         Age Indeterminate   +---------+---------------+---------+-----------+----------+-------------------+ POP      None           Yes      Yes                  Age Indeterminate   +---------+---------------+---------+-----------+----------+-------------------+ PTV      Full                                                             +---------+---------------+---------+-----------+----------+-------------------+ PERO                                                  Not well visualized +---------+---------------+---------+-----------+----------+-------------------+ IIV                     Yes      Yes                                       +---------+---------------+---------+-----------+----------+-------------------+  Summary: RIGHT: - There is no evidence of deep vein thrombosis in the lower extremity.  - No cystic structure found in the popliteal fossa.  LEFT: - Findings consistent with age indeterminate deep vein thrombosis involving the left common femoral vein, left proximal profunda vein, left popliteal vein, and left femoral vein.  *See table(s) above for measurements and observations. Electronically signed by Deitra Mayo MD on 03/25/2020 at 3:51:32 PM.    Final      Subjective: No chest pain or sob, still on 1 to 2 lit of [place] OXYGEN.   Discharge Exam: Vitals:   03/26/20 2119 03/27/20 0702  BP: 108/60 128/69  Pulse: 87 68  Resp: 18 16  Temp: 99.2 F (37.3 C) 97.8 F (36.6 C)  SpO2: 90% 93%   Vitals:   03/26/20 0940 03/26/20 1332 03/26/20 2119 03/27/20 0702  BP: 114/71 132/63 108/60 128/69  Pulse: 77 67 87 68  Resp: (!) 21 19 18 16   Temp: 98.8 F (37.1 C) 99.8 F (37.7 C) 99.2 F (37.3 C) 97.8 F (36.6 C)  TempSrc: Oral Oral Oral Oral  SpO2: 91% 92% 90% 93%  Weight:      Height:        General: Pt is alert, awake, not in acute distress Cardiovascular: RRR, S1/S2 +, no rubs, no gallops Respiratory: CTA bilaterally, no wheezing, no rhonchi Abdominal: Soft, NT, ND, bowel sounds + Extremities: no edema, no cyanosis    The results of significant diagnostics from this hospitalization (including imaging, microbiology, ancillary and laboratory) are listed below for reference.     Microbiology: Recent Results (from the past 240 hour(s))  Resp Panel by RT-PCR (Flu A&B, Covid) Nasopharyngeal Swab     Status: None   Collection Time: 03/25/20  9:10 AM   Specimen: Nasopharyngeal Swab; Nasopharyngeal(NP) swabs in vial transport medium  Result Value Ref Range Status   SARS Coronavirus 2 by RT PCR NEGATIVE NEGATIVE Final    Comment: (NOTE) SARS-CoV-2 target nucleic acids are NOT DETECTED.  The  SARS-CoV-2 RNA is generally detectable in upper respiratory specimens during the acute phase of infection. The lowest concentration of SARS-CoV-2 viral copies this assay can detect is 138 copies/mL. A negative result does not preclude SARS-Cov-2 infection and should not be used as the sole basis for treatment or other patient management decisions. A negative result may occur with  improper specimen collection/handling, submission of specimen other than nasopharyngeal swab, presence of viral mutation(s) within the areas targeted by this assay, and inadequate number of viral copies(<138 copies/mL). A negative result must be combined with clinical observations, patient history, and epidemiological information. The expected result is Negative.  Fact Sheet for Patients:  EntrepreneurPulse.com.au  Fact Sheet for Healthcare Providers:  IncredibleEmployment.be  This test is no t yet approved or cleared by the [place] FDA and  has been authorized for detection and/or diagnosis of SARS-CoV-2 by FDA under an Emergency Use Authorization (EUA). This EUA will remain  in effect (meaning this test can be used) for the duration of the COVID-19 declaration under Section 564(b)(1) of the Act, 21 U.S.C.section 360bbb-3(b)(1), unless the authorization is terminated  or revoked sooner.       Influenza A by PCR NEGATIVE NEGATIVE Final   Influenza B by PCR NEGATIVE NEGATIVE Final    Comment: (NOTE) The Xpert Xpress SARS-CoV-2/FLU/RSV plus assay is intended as an aid in the diagnosis of influenza from Nasopharyngeal swab specimens and should not be used as a sole basis for treatment. Nasal  washings and aspirates are unacceptable for Xpert Xpress SARS-CoV-2/FLU/RSV testing.  Fact Sheet for Patients: EntrepreneurPulse.com.au  Fact Sheet for Healthcare Providers: IncredibleEmployment.be  This test is not yet approved or  cleared by the [place] FDA and has been authorized for detection and/or diagnosis of SARS-CoV-2 by FDA under an Emergency Use Authorization (EUA). This EUA will remain in effect (meaning this test can be used) for the duration of the COVID-19 declaration under Section 564(b)(1) of the Act, 21 U.S.C. section 360bbb-3(b)(1), unless the authorization is terminated or revoked.  Performed at [facility], [place] [address], [place], [place] [address]      Labs: BNP (last 3 results) No results for input(s): BNP in the last 8760 hours. Basic Metabolic Panel: Recent Labs  Lab 03/25/20 0923 03/26/20 0521  NA 134* 134*  K 4.1 3.7  CL 99 101  CO2 26 23  GLUCOSE 90 92  BUN 18 16  CREATININE 1.06* 0.98  CALCIUM 8.8* 8.4*   Liver Function Tests: Recent Labs  Lab 03/25/20 0923 03/26/20 0521  AST 20 15  ALT 23 17  ALKPHOS 96 84  BILITOT 0.7 0.7  PROT 6.2* 5.4*  ALBUMIN 3.2* 2.7*   Recent Labs  Lab 03/25/20 0923  LIPASE 58*   No results for input(s): AMMONIA in the last 168 hours. CBC: Recent Labs  Lab 03/25/20 0923 03/26/20 0521 03/27/20 0946  WBC 5.2 3.7* 3.7*  NEUTROABS 4.4  --   --   HGB 12.6 12.1 12.3  HCT 39.7 37.5 39.0  MCV 90.6 90.1 90.7  PLT 146* 127* 116*   Cardiac Enzymes: No results for input(s): CKTOTAL, CKMB, CKMBINDEX, TROPONINI in the last 168 hours. BNP: Invalid input(s): POCBNP CBG: No results for input(s): GLUCAP in the last 168 hours. D-Dimer No results for input(s): DDIMER in the last 72 hours. Hgb A1c No results for input(s): HGBA1C in the last 72 hours. Lipid Profile No results for input(s): CHOL, HDL, LDLCALC, TRIG, CHOLHDL, LDLDIRECT in the last 72 hours. Thyroid function studies No results for input(s): TSH, T4TOTAL, T3FREE, THYROIDAB in the last 72 hours.  Invalid input(s): FREET3 Anemia work up No results for input(s): VITAMINB12, FOLATE, FERRITIN, TIBC, IRON, RETICCTPCT in the last 72 hours. Urinalysis     Component Value Date/Time   COLORURINE STRAW (A) 03/25/2020 [place] 03/25/2020 0947   LABSPEC 1.028 03/25/2020 0947   PHURINE 6.0 03/25/2020 [place] 03/25/2020 0947   HGBUR NEGATIVE 03/25/2020 [place] 03/25/2020 [place] 03/25/2020 0947   PROTEINUR NEGATIVE 03/25/2020 0947   NITRITE NEGATIVE 03/25/2020 0947   LEUKOCYTESUR NEGATIVE 03/25/2020 0947   Sepsis Labs Invalid input(s): PROCALCITONIN,  WBC,  LACTICIDVEN Microbiology Recent Results (from the past 240 hour(s))  Resp Panel by RT-PCR (Flu A&B, Covid) Nasopharyngeal Swab     Status: None   Collection Time: 03/25/20  9:10 AM   Specimen: Nasopharyngeal Swab; Nasopharyngeal(NP) swabs in vial transport medium  Result Value Ref Range Status   SARS Coronavirus 2 by RT PCR NEGATIVE NEGATIVE Final    Comment: (NOTE) SARS-CoV-2 target nucleic acids are NOT DETECTED.  The SARS-CoV-2 RNA is generally detectable in upper respiratory specimens during the acute phase of infection. The lowest concentration of SARS-CoV-2 viral copies this assay can detect is 138 copies/mL. A negative result does not preclude SARS-Cov-2 infection and should not be used as the sole basis for treatment or other patient management decisions. A negative result  may occur with  improper specimen collection/handling, submission of specimen other than nasopharyngeal swab, presence of viral mutation(s) within the areas targeted by this assay, and inadequate number of viral copies(<138 copies/mL). A negative result must be combined with clinical observations, patient history, and epidemiological information. The expected result is Negative.  Fact Sheet for Patients:  EntrepreneurPulse.com.au  Fact Sheet for Healthcare Providers:  IncredibleEmployment.be  This test is no t yet approved or cleared by the [place] FDA and  has been authorized for  detection and/or diagnosis of SARS-CoV-2 by FDA under an Emergency Use Authorization (EUA). This EUA will remain  in effect (meaning this test can be used) for the duration of the COVID-19 declaration under Section 564(b)(1) of the Act, 21 U.S.C.section 360bbb-3(b)(1), unless the authorization is terminated  or revoked sooner.       Influenza A by PCR NEGATIVE NEGATIVE Final   Influenza B by PCR NEGATIVE NEGATIVE Final    Comment: (NOTE) The Xpert Xpress SARS-CoV-2/FLU/RSV plus assay is intended as an aid in the diagnosis of influenza from Nasopharyngeal swab specimens and should not be used as a sole basis for treatment. Nasal washings and aspirates are unacceptable for Xpert Xpress SARS-CoV-2/FLU/RSV testing.  Fact Sheet for Patients: EntrepreneurPulse.com.au  Fact Sheet for Healthcare Providers: IncredibleEmployment.be  This test is not yet approved or cleared by the [place] FDA and has been authorized for detection and/or diagnosis of SARS-CoV-2 by FDA under an Emergency Use Authorization (EUA). This EUA will remain in effect (meaning this test can be used) for the duration of the COVID-19 declaration under Section 564(b)(1) of the Act, 21 U.S.C. section 360bbb-3(b)(1), unless the authorization is terminated or revoked.  Performed at [facility], [place] [address] [place], [place] [address]      Time coordinating discharge: 34 minutes.  SIGNED:   Hosie Poisson, MD  [facility]

## 2020-03-29 ENCOUNTER — Inpatient Hospital Stay: Payer: Medicare HMO | Admitting: Internal Medicine

## 2020-03-29 ENCOUNTER — Inpatient Hospital Stay: Payer: Medicare HMO

## 2020-03-29 ENCOUNTER — Other Ambulatory Visit: Payer: Self-pay | Admitting: Medical Oncology

## 2020-03-29 ENCOUNTER — Telehealth: Payer: Self-pay | Admitting: Medical Oncology

## 2020-03-29 ENCOUNTER — Encounter: Payer: Self-pay | Admitting: Internal Medicine

## 2020-03-29 ENCOUNTER — Telehealth: Payer: Self-pay

## 2020-03-29 ENCOUNTER — Other Ambulatory Visit: Payer: Self-pay

## 2020-03-29 VITALS — BP 95/54 | HR 63 | Temp 98.2°F | Resp 16 | Ht 66.0 in | Wt 176.6 lb

## 2020-03-29 DIAGNOSIS — R531 Weakness: Secondary | ICD-10-CM

## 2020-03-29 DIAGNOSIS — C3492 Malignant neoplasm of unspecified part of left bronchus or lung: Secondary | ICD-10-CM | POA: Diagnosis not present

## 2020-03-29 DIAGNOSIS — C3432 Malignant neoplasm of lower lobe, left bronchus or lung: Secondary | ICD-10-CM | POA: Diagnosis not present

## 2020-03-29 MED ORDER — SODIUM CHLORIDE 0.9 % IV SOLN
Freq: Once | INTRAVENOUS | Status: AC
  Filled 2020-03-29: qty 250

## 2020-03-29 MED ORDER — SODIUM CHLORIDE 0.9 % IV SOLN
Freq: Once | INTRAVENOUS | Status: DC
  Filled 2020-03-29: qty 1000

## 2020-03-29 MED ORDER — SODIUM CHLORIDE 0.9 % IV SOLN
Freq: Once | INTRAVENOUS | Status: DC
  Filled 2020-03-29: qty 250

## 2020-03-29 NOTE — Telephone Encounter (Signed)
Referred pt to [facility] care . They will review her records and let me know if they can take pt.  Sister , [name] notified.

## 2020-03-29 NOTE — Progress Notes (Signed)
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  Biomarker Findings Microsatellite status - MS-Stable Tumor Mutational Burden - 1 Muts/Mb Genomic Findings For a complete list of the genes assayed, please refer to the Appendix. EGFR exon 19 deletion ([id number]) 7 Disease relevant genes with no reportable alterations: ALK, BRAF, ERBB2, KRAS, MET, RET, ROS1  PDL1 expression: 2%.  PRIOR THERAPY: Whole brain radiation and radiotherapy to the osseous metastases under the care of Dr. Lisbeth Renshaw. Last treatment expected on 02/02/20  CURRENT THERAPY: Tagrisso160 mg p.o. daily. First dose on9/27/21. Status post 2 months of treatment  INTERVAL HISTORY: Stefanie Camacho 74 y.o. female returns to the clinic today for follow-up visit accompanied by her daughter.  The patient is feeling a little bit better today except for intermittent pain in the lower sternal area as well as shortness of breath at baseline increased with exertion.  She denied having any current hemoptysis.  She has no nausea, vomiting, diarrhea or constipation.  She denied having any headache or visual changes.  She has no fever or chills.  She had repeat CT scan of the chest, abdomen pelvis performed recently and she is here for evaluation and discussion of her risk her results.  MEDICAL HISTORY: Past Medical History:  Diagnosis Date  . Anxiety   . Dyslipidemia   . Essential hypertension   . GERD (gastroesophageal reflux disease)   . Hypothyroidism   . Left bundle branch block  06/04/2018  . NICM (nonischemic cardiomyopathy) ([place])   . Nonischemic cardiomyopathy ([place]) 06/04/2018   Ejection fraction 45% in summer 2019  . Sleep apnea     ALLERGIES:  has No Known Allergies.  MEDICATIONS:  Current Outpatient Medications  Medication Sig Dispense Refill  . acetaminophen (TYLENOL) 500 MG tablet Take 1,000 mg by mouth every 6 (six) hours as needed for mild pain.    Marland Kitchen apixaban (ELIQUIS) 5 MG TABS tablet Take 2 tablets (10 mg total) by mouth 2 (two) times daily for 4 days. 16 tablet 0  . [START ON 04/01/2020] apixaban (ELIQUIS) 5 MG TABS tablet Take 1 tablet (5 mg total) by mouth 2 (two) times daily. 60 tablet 1  . atorvastatin (LIPITOR) 10 MG tablet Take 1 tablet (10 mg total) by mouth daily at 6 PM. 30 tablet 2  . FLUoxetine (PROZAC) 10 MG tablet Take 1 tablet (10 mg total) by mouth daily. 30 tablet 3  . levETIRAcetam (KEPPRA) 500 MG tablet Take 1 tablet (500 mg total) by mouth 2 (two) times daily. 60 tablet 1  . levothyroxine (SYNTHROID) 75 MCG tablet Take 1 tablet (75 mcg total) by mouth daily. 30 tablet 1  . loperamide (IMODIUM) 2 MG capsule Take 2 mg by mouth as needed for diarrhea or loose stools.    Marland Kitchen losartan (COZAAR) 50 MG tablet Take 1 tablet (50 mg total) by mouth daily. 90 tablet 2  . metoprolol succinate (TOPROL-XL) 50 MG 24 hr tablet Take 1 tablet (50 mg total) by mouth daily. Take with or immediately following a meal. 90 tablet 2  . osimertinib mesylate (TAGRISSO) 80 MG  tablet Take 2 tablets (160 mg total) by mouth daily. Days 1-28. 60 tablet 3  . pantoprazole (PROTONIX) 40 MG tablet Take 1 tablet (40 mg total) by mouth daily. 30 tablet 1  . prochlorperazine (COMPAZINE) 10 MG tablet Take 1 tablet (10 mg total) by mouth every 6 (six) hours as needed for nausea or vomiting. 45 tablet 5  . spironolactone (ALDACTONE) 25 MG tablet Take 0.5 tablets (12.5 mg total) by mouth daily. 45 tablet 3   No current facility-administered medications for this visit.     SURGICAL HISTORY:  Past Surgical History:  Procedure Laterality Date  . ABDOMINAL HYSTERECTOMY    . BLADDER NECK RECONSTRUCTION    . BRONCHIAL BIOPSY  01/12/2020   Procedure: BRONCHIAL BIOPSIES;  Surgeon: Garner Nash, DO;  Location: [facility];  Service: Pulmonary;;  . BRONCHIAL BRUSHINGS  01/12/2020   Procedure: BRONCHIAL BRUSHINGS;  Surgeon: Garner Nash, DO;  Location: [facility];  Service: Pulmonary;;  . BRONCHIAL NEEDLE ASPIRATION BIOPSY  01/12/2020   Procedure: BRONCHIAL NEEDLE ASPIRATION BIOPSIES;  Surgeon: Garner Nash, DO;  Location: [facility];  Service: Pulmonary;;  . BRONCHIAL WASHINGS  01/12/2020   Procedure: BRONCHIAL WASHINGS;  Surgeon: Garner Nash, DO;  Location: [facility];  Service: Pulmonary;;  . SHOULDER SURGERY    . TONSILLECTOMY    . VIDEO BRONCHOSCOPY WITH ENDOBRONCHIAL ULTRASOUND  01/12/2020   Procedure: VIDEO BRONCHOSCOPY WITH ENDOBRONCHIAL ULTRASOUND;  Surgeon: Garner Nash, DO;  Location: [facility];  Service: Pulmonary;;    REVIEW OF SYSTEMS:  Constitutional: positive for fatigue Eyes: negative Ears, nose, mouth, throat, and face: negative Respiratory: positive for cough, dyspnea on exertion and pleurisy/chest pain Cardiovascular: negative Gastrointestinal: negative Genitourinary:negative Integument/breast: negative Hematologic/lymphatic: negative Musculoskeletal:positive for muscle weakness Neurological: negative Behavioral/Psych: negative Endocrine: negative Allergic/Immunologic: negative   PHYSICAL EXAMINATION: General appearance: alert, cooperative, fatigued and no distress Head: Normocephalic, without obvious abnormality, atraumatic Neck: no adenopathy, no JVD, supple, symmetrical, trachea midline and thyroid not enlarged, symmetric, no tenderness/mass/nodules Lymph nodes: Cervical, supraclavicular, and axillary nodes normal. Resp: clear to auscultation bilaterally Back: symmetric, no curvature. ROM normal. No CVA  tenderness. Cardio: regular rate and rhythm, S1, S2 normal, no murmur, click, rub or gallop GI: soft, non-tender; bowel sounds normal; no masses,  no organomegaly Extremities: extremities normal, atraumatic, no cyanosis or edema Neurologic: Alert and oriented X 3, normal strength and tone. Normal symmetric reflexes. Normal coordination and gait  ECOG PERFORMANCE STATUS: 2 - Symptomatic, <50% confined to bed  Blood pressure (!) 95/54, pulse 63, temperature 98.2 F (36.8 C), temperature source Tympanic, resp. rate 16, height _0  (1.676 m), weight 176 lb 9.6 oz (80.1 kg), SpO2 95 %.  LABORATORY DATA: Lab Results  Component Value Date   WBC 3.7 (L) 03/27/2020   HGB 12.3 03/27/2020   HCT 39.0 03/27/2020   MCV 90.7 03/27/2020   PLT 116 (L) 03/27/2020      Chemistry      Component Value Date/Time   NA 134 (L) 03/26/2020 0521   K 3.7 03/26/2020 0521   CL 101 03/26/2020 0521   CO2 23 03/26/2020 0521   BUN 16 03/26/2020 0521   CREATININE 0.98 03/26/2020 0521   CREATININE 1.15 (H) 03/18/2020 1026      Component Value Date/Time   CALCIUM 8.4 (L) 03/26/2020 0521   ALKPHOS 84 03/26/2020 0521   AST 15 03/26/2020 0521   AST 23 03/18/2020 1026   ALT 17 03/26/2020 0521   ALT 19 03/18/2020 1026  BILITOT 0.7 03/26/2020 0521   BILITOT 0.6 03/18/2020 1026       RADIOGRAPHIC STUDIES: CT Chest W Contrast  Result Date: 03/25/2020 CLINICAL DATA:  Non-small cell lung cancer, metastatic assess treatment response. EXAM: CT CHEST, ABDOMEN, AND PELVIS WITH CONTRAST TECHNIQUE: Multidetector CT imaging of the chest, abdomen and pelvis was performed following the standard protocol during bolus administration of intravenous contrast. CONTRAST:  168mL OMNIPAQUE IOHEXOL 300 MG/ML  SOLN COMPARISON:  Comparison study of 01/10/2020 FINDINGS: CT CHEST FINDINGS Cardiovascular: Heart size moderately enlarged. RV to LV ratio of 0.86. Thoracic aorta is normal caliber with smooth contour. Pulmonary embolus in  the RIGHT main pulmonary artery extending into upper lobe lobar level branches and lower lobe lobar level branches. Mediastinum/Nodes: Thoracic inlet structures are normal.  The No axillary lymphadenopathy. No mediastinal lymphadenopathy. No hilar lymphadenopathy. Lungs/Pleura: Marked interval decrease in the number of well-defined nodules in the chest since January 10, 2020. Diminished size of LEFT lower lobe pulmonary mass since the prior study. (Image 94, series 4) 2.7 x 2.6 cm LEFT lower lobe pulmonary nodule previously pulmonary mass measuring approximately 4.7 x 5.1 cm. Signs of septal thickening have developed since the previous study along with ground-glass in the lingula and LEFT lower lobe. (Image 91, series 4) 3.1 x 1.8 cm area of peripheral consolidative change with central lucency in the RIGHT lower lobe along the fissure abutting the pleura. Similar peripheral area of density in the RIGHT upper lobe 1.9 x 1.6 cm (image 36, series 4) the well-defined nodules that were centrally innumerable on the prior study have diminished in number and there are numerous areas of ground-glass nodularity throughout the chest in place of these nodules with more confluent ground-glass as discussed above. There is no pleural effusion. Airways are patent. Musculoskeletal: See below for full musculoskeletal details. 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 New small area of low density in the LEFT hepatic lobe (image 45, series 2) 13 mm. In hindsight there is very subtle heterogeneity in this area on the prior study, perhaps this represents treated metastatic lesion. There is also a subtle area of low attenuation in the RIGHT hepatic lobe not apparent on the prior study (image 60, series 2) 9 mm. Gallbladder without signs of pericholecystic stranding. No biliary duct dilation. Pancreas: Pancreatic atrophy without ductal dilation, sign of inflammation or focal lesion. Spleen: Spleen normal  in size and contour without focal lesion. Adrenals/Urinary Tract: Adrenal glands are normal. Symmetric renal enhancement. No hydronephrosis. Urinary bladder under distended. This limits assessment. Cysts in the RIGHT kidney similar to prior imaging. Stomach/Bowel: No acute gastrointestinal process.  Normal appendix. Vascular/Lymphatic: Patent abdominal vasculature. Scattered atheromatous plaque in the abdominal aorta. No aneurysmal dilation. There is no gastrohepatic or hepatoduodenal ligament lymphadenopathy. No retroperitoneal or mesenteric lymphadenopathy. No pelvic sidewall lymphadenopathy. Engorgement of RIGHT internal iliac vein and deep pelvic veins along the RIGHT pelvic sidewall. Variable attenuation with some areas suggesting central low attenuation, for instance on image 106 of series 2. Reproductive: Post hysterectomy.  No adnexal mass. Other: No hernia.  No ascites. Musculoskeletal: Metastatic lesions in ribs, RIGHT 6 rib with interval sclerosis and healing, further healing of a pathologic fracture on image 93 of series 4 New subacute fracture with similar appearance and healing at the site of lucency in the posterior ninth rib. Adjacent sclerotic focus similar to the prior study. No acute displaced rib fractures. Subtle sternal sclerotic foci in the sternal manubrium. New sclerotic foci LEFT humeral head just  less than a cm size (image 11, series 4) Is lucent area in the RIGHT iliac on the prior study is now partially sclerotic (image 100, series 2) measuring approximately 2.8 cm. Irregularity of the RIGHT pubic root with sclerosis is similar to the previous exam. (Image 110, series 2) this extends into the acetabulum at this level Numerous new sclerotic foci throughout the pelvis and spine Numerous new sclerotic foci scattered throughout visualized skeleton, for instance on image 117 of series 2 in the proximal LEFT femur. A subtle lucent area seen just posterior to this and inferior to this on image  119 of series 2 is an example of a sclerotic focus that has developed at the site of previous lucency. Further sclerosis at the margin of a sacral metastasis on image 94 of series 2 adjacent to the central canal along the sacrum. Numerous foci of sclerosis have developed in the spine. Is for instance, in hindsight an area of very subtle lucency in the spine at the T7 level shows dense sclerosis (image 101, series 6), nearly all vertebral levels are involved. IMPRESSION: 1. Pulmonary embolus in the RIGHT main pulmonary artery extending into upper lobe lobar level branches and RIGHT lower lobe lobar level branches. Borderline RV to LV ratio nearly 0.9, RIGHT heart with some enlargement on the previous study. This is of uncertain clinical significance but could be seen in the early setting of RIGHT heart strain. Consider correlation with symptoms and echocardiography as warranted given that there is considerable clot in the RIGHT main pulmonary artery and its branches. 2. New areas of nodularity/consolidation in the RIGHT chest more likely related to pulmonary infarcts. Attention on follow-up 3. Marked interval response to therapy in the chest as described. 4. Nearly confluent ground-glass in the LEFT chest may represent post treatment changes given that nodularity elsewhere has transitioned from solid to ground-glass appearance. Disease was more extensive in this area which could explain these findings. Close attention on follow-up is suggested to exclude the possibility of superimposed pneumonitis. 5. Subtle lesions in the liver not apparent on previous imaging may represent treated, previously radiographically occult disease given other findings on the current study. Attention on follow-up is suggested. 6. Irregularity of the RIGHT pubic root could be at risk for pathologic fracture but is similar to the prior imaging study. 7. Signs of pathologic fracture in ribs with interval healing as described. Numerous new  foci of sclerosis involving nearly all visualized bones could represent interval response to therapy of pre-existing occult metastatic foci. Close attention on follow-up is suggested. 8. Engorgement of RIGHT iliac, internal iliac and deep pelvic veins, this may represent the source of the patient's embolus. 9. Aortic atherosclerosis. Critical Value/emergent results were called by telephone at the time of interpretation on 03/25/2020 at 08:30 a.m. to provider [facility] , who verbally acknowledged these results. Other nonemergent findings were communicated via the PRA as outlined in the Z vision dash board. Aortic Atherosclerosis (ICD10-I70.0). Electronically Signed   By: Zetta Bills M.D.   On: 03/25/2020 08:58   CT Abdomen Pelvis W Contrast  Result Date: 03/25/2020 CLINICAL DATA:  Non-small cell lung cancer, metastatic assess treatment response. EXAM: CT CHEST, ABDOMEN, AND PELVIS WITH CONTRAST TECHNIQUE: Multidetector CT imaging of the chest, abdomen and pelvis was performed following the standard protocol during bolus administration of intravenous contrast. CONTRAST:  150mL OMNIPAQUE IOHEXOL 300 MG/ML  SOLN COMPARISON:  Comparison study of 01/10/2020 FINDINGS: CT CHEST FINDINGS Cardiovascular: Heart size moderately enlarged. RV to LV  ratio of 0.86. Thoracic aorta is normal caliber with smooth contour. Pulmonary embolus in the RIGHT main pulmonary artery extending into upper lobe lobar level branches and lower lobe lobar level branches. Mediastinum/Nodes: Thoracic inlet structures are normal.  The No axillary lymphadenopathy. No mediastinal lymphadenopathy. No hilar lymphadenopathy. Lungs/Pleura: Marked interval decrease in the number of well-defined nodules in the chest since January 10, 2020. Diminished size of LEFT lower lobe pulmonary mass since the prior study. (Image 94, series 4) 2.7 x 2.6 cm LEFT lower lobe pulmonary nodule previously pulmonary mass measuring approximately 4.7 x 5.1  cm. Signs of septal thickening have developed since the previous study along with ground-glass in the lingula and LEFT lower lobe. (Image 91, series 4) 3.1 x 1.8 cm area of peripheral consolidative change with central lucency in the RIGHT lower lobe along the fissure abutting the pleura. Similar peripheral area of density in the RIGHT upper lobe 1.9 x 1.6 cm (image 36, series 4) the well-defined nodules that were centrally innumerable on the prior study have diminished in number and there are numerous areas of ground-glass nodularity throughout the chest in place of these nodules with more confluent ground-glass as discussed above. There is no pleural effusion. Airways are patent. Musculoskeletal: See below for full musculoskeletal details. 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 New small area of low density in the LEFT hepatic lobe (image 45, series 2) 13 mm. In hindsight there is very subtle heterogeneity in this area on the prior study, perhaps this represents treated metastatic lesion. There is also a subtle area of low attenuation in the RIGHT hepatic lobe not apparent on the prior study (image 60, series 2) 9 mm. Gallbladder without signs of pericholecystic stranding. No biliary duct dilation. Pancreas: Pancreatic atrophy without ductal dilation, sign of inflammation or focal lesion. Spleen: Spleen normal in size and contour without focal lesion. Adrenals/Urinary Tract: Adrenal glands are normal. Symmetric renal enhancement. No hydronephrosis. Urinary bladder under distended. This limits assessment. Cysts in the RIGHT kidney similar to prior imaging. Stomach/Bowel: No acute gastrointestinal process.  Normal appendix. Vascular/Lymphatic: Patent abdominal vasculature. Scattered atheromatous plaque in the abdominal aorta. No aneurysmal dilation. There is no gastrohepatic or hepatoduodenal ligament lymphadenopathy. No retroperitoneal or mesenteric lymphadenopathy. No pelvic  sidewall lymphadenopathy. Engorgement of RIGHT internal iliac vein and deep pelvic veins along the RIGHT pelvic sidewall. Variable attenuation with some areas suggesting central low attenuation, for instance on image 106 of series 2. Reproductive: Post hysterectomy.  No adnexal mass. Other: No hernia.  No ascites. Musculoskeletal: Metastatic lesions in ribs, RIGHT 6 rib with interval sclerosis and healing, further healing of a pathologic fracture on image 93 of series 4 New subacute fracture with similar appearance and healing at the site of lucency in the posterior ninth rib. Adjacent sclerotic focus similar to the prior study. No acute displaced rib fractures. Subtle sternal sclerotic foci in the sternal manubrium. New sclerotic foci LEFT humeral head just less than a cm size (image 11, series 4) Is lucent area in the RIGHT iliac on the prior study is now partially sclerotic (image 100, series 2) measuring approximately 2.8 cm. Irregularity of the RIGHT pubic root with sclerosis is similar to the previous exam. (Image 110, series 2) this extends into the acetabulum at this level Numerous new sclerotic foci throughout the pelvis and spine Numerous new sclerotic foci scattered throughout visualized skeleton, for instance on image 117 of series 2 in the proximal LEFT femur. A subtle lucent area seen just  posterior to this and inferior to this on image 119 of series 2 is an example of a sclerotic focus that has developed at the site of previous lucency. Further sclerosis at the margin of a sacral metastasis on image 94 of series 2 adjacent to the central canal along the sacrum. Numerous foci of sclerosis have developed in the spine. Is for instance, in hindsight an area of very subtle lucency in the spine at the T7 level shows dense sclerosis (image 101, series 6), nearly all vertebral levels are involved. IMPRESSION: 1. Pulmonary embolus in the RIGHT main pulmonary artery extending into upper lobe lobar level  branches and RIGHT lower lobe lobar level branches. Borderline RV to LV ratio nearly 0.9, RIGHT heart with some enlargement on the previous study. This is of uncertain clinical significance but could be seen in the early setting of RIGHT heart strain. Consider correlation with symptoms and echocardiography as warranted given that there is considerable clot in the RIGHT main pulmonary artery and its branches. 2. New areas of nodularity/consolidation in the RIGHT chest more likely related to pulmonary infarcts. Attention on follow-up 3. Marked interval response to therapy in the chest as described. 4. Nearly confluent ground-glass in the LEFT chest may represent post treatment changes given that nodularity elsewhere has transitioned from solid to ground-glass appearance. Disease was more extensive in this area which could explain these findings. Close attention on follow-up is suggested to exclude the possibility of superimposed pneumonitis. 5. Subtle lesions in the liver not apparent on previous imaging may represent treated, previously radiographically occult disease given other findings on the current study. Attention on follow-up is suggested. 6. Irregularity of the RIGHT pubic root could be at risk for pathologic fracture but is similar to the prior imaging study. 7. Signs of pathologic fracture in ribs with interval healing as described. Numerous new foci of sclerosis involving nearly all visualized bones could represent interval response to therapy of pre-existing occult metastatic foci. Close attention on follow-up is suggested. 8. Engorgement of RIGHT iliac, internal iliac and deep pelvic veins, this may represent the source of the patient's embolus. 9. Aortic atherosclerosis. Critical Value/emergent results were called by telephone at the time of interpretation on 03/25/2020 at 08:30 a.m. to provider [facility] , who verbally acknowledged these results. Other nonemergent findings were  communicated via the PRA as outlined in the Z vision dash board. Aortic Atherosclerosis (ICD10-I70.0). Electronically Signed   By: Zetta Bills M.D.   On: 03/25/2020 08:58   ECHOCARDIOGRAM COMPLETE  Result Date: 03/25/2020    ECHOCARDIOGRAM REPORT   Patient Name:   Stefanie Camacho Date of Exam: 03/25/2020 Medical Rec #:  [id number]     Height:       66.0 in Accession #:    [id number]    Weight:       179.4 lb Date of Birth:  October 20, 1945     BSA:          1.910 m Patient Age:    18 years      BP:           135/57 mmHg Patient Gender: F             HR:           54 bpm. Exam Location:  Inpatient Procedure: 2D Echo STAT ECHO Indications:    Pulmonary Embolus I26.99  History:        Patient has prior history of Echocardiogram examinations, most  recent 01/09/2020. Non-ischemic Cardiomyopathy; Risk                 Factors:Hypertension and Dyslipidemia.  Sonographer:    Mikki Santee RDCS (AE) Referring Phys: [id number] [place]  1. Left ventricular ejection fraction, by estimation, is 55 to 60%. The left ventricle has normal function. The left ventricle has no regional wall motion abnormalities. Left ventricular diastolic parameters are consistent with Grade I diastolic dysfunction (impaired relaxation).  2. Right ventricular systolic function is normal. The right ventricular size is normal. There is normal pulmonary artery systolic pressure. The estimated right ventricular systolic pressure is 84.1 mmHg.  3. The mitral valve is grossly normal. Mild mitral valve regurgitation. No evidence of mitral stenosis.  4. The aortic valve is tricuspid. Aortic valve regurgitation is not visualized. No aortic stenosis is present.  5. The inferior vena cava is normal in size with greater than 50% respiratory variability, suggesting right atrial pressure of 3 mmHg. Comparison(s): No significant change from prior study. FINDINGS  Left Ventricle: Left ventricular ejection fraction, by estimation, is 55  to 60%. The left ventricle has normal function. The left ventricle has no regional wall motion abnormalities. The left ventricular internal cavity size was normal in size. There is  no left ventricular hypertrophy. Left ventricular diastolic parameters are consistent with Grade I diastolic dysfunction (impaired relaxation). Normal left ventricular filling pressure. Right Ventricle: The right ventricular size is normal. No increase in right ventricular wall thickness. Right ventricular systolic function is normal. There is normal pulmonary artery systolic pressure. The tricuspid regurgitant velocity is 1.99 m/s, and  with an assumed right atrial pressure of 3 mmHg, the estimated right ventricular systolic pressure is 66.0 mmHg. Left Atrium: Left atrial size was normal in size. Right Atrium: Right atrial size was normal in size. Pericardium: Trivial pericardial effusion is present. Presence of pericardial fat pad. Mitral Valve: The mitral valve is grossly normal. Mild mitral valve regurgitation. No evidence of mitral valve stenosis. Tricuspid Valve: The tricuspid valve is grossly normal. Tricuspid valve regurgitation is trivial. No evidence of tricuspid stenosis. Aortic Valve: The aortic valve is tricuspid. Aortic valve regurgitation is not visualized. No aortic stenosis is present. Pulmonic Valve: The pulmonic valve was grossly normal. Pulmonic valve regurgitation is trivial. No evidence of pulmonic stenosis. Aorta: The aortic root and ascending aorta are structurally normal, with no evidence of dilitation. Venous: The right upper pulmonary vein is normal. The inferior vena cava is normal in size with greater than 50% respiratory variability, suggesting right atrial pressure of 3 mmHg. IAS/Shunts: The atrial septum is grossly normal.  LEFT VENTRICLE PLAX 2D LVIDd:         4.80 cm  Diastology LVIDs:         3.70 cm  LV e' medial:    4.98 cm/s LV PW:         0.90 cm  LV E/e' medial:  14.4 LV IVS:        0.90 cm  LV e'  lateral:   6.48 cm/s LVOT diam:     2.20 cm  LV E/e' lateral: 11.1 LV SV:         60 LV SV Index:   31 LVOT Area:     3.80 cm  RIGHT VENTRICLE RV S prime:     10.00 cm/s TAPSE (M-mode): 1.9 cm LEFT ATRIUM             Index       RIGHT ATRIUM  Index LA diam:        3.80 cm 1.99 cm/m  RA Area:     11.00 cm LA Vol (A2C):   74.4 ml 38.96 ml/m RA Volume:   20.00 ml  10.47 ml/m LA Vol (A4C):   57.8 ml 30.27 ml/m LA Biplane Vol: 67.6 ml 35.40 ml/m  AORTIC VALVE LVOT Vmax:   73.10 cm/s LVOT Vmean:  42.900 cm/s LVOT VTI:    0.158 m  AORTA Ao Root diam: 2.70 cm MITRAL VALVE               TRICUSPID VALVE MV Area (PHT): 2.48 cm    TR Peak grad:   15.8 mmHg MV Decel Time: 306 msec    TR Vmax:        199.00 cm/s MR Peak grad: 117.9 mmHg MR Mean grad: 74.0 mmHg    SHUNTS MR Vmax:      543.00 cm/s  Systemic VTI:  0.16 m MR Vmean:     410.0 cm/s   Systemic Diam: 2.20 cm MV E velocity: 71.80 cm/s MV A velocity: 97.50 cm/s MV E/A ratio:  0.74 Eleonore Chiquito MD Electronically signed by Eleonore Chiquito MD Signature Date/Time: 03/25/2020/11:46:59 AM    Final    VAS [place] LOWER EXTREMITY VENOUS (DVT)  Result Date: 03/25/2020  Lower Venous DVT Study Indications: Swelling, and Pain.  Comparison Study: 01/09/20 previous negative Performing Technologist: Abram Sander RVS  Examination Guidelines: A complete evaluation includes B-mode imaging, spectral Doppler, color Doppler, and power Doppler as needed of all accessible portions of each vessel. Bilateral testing is considered an integral part of a complete examination. Limited examinations for reoccurring indications may be performed as noted. The reflux portion of the exam is performed with the patient in reverse Trendelenburg.  +---------+---------------+---------+-----------+----------+--------------+ RIGHT    CompressibilityPhasicitySpontaneityPropertiesThrombus Aging +---------+---------------+---------+-----------+----------+--------------+ CFV      Full            Yes      Yes                                 +---------+---------------+---------+-----------+----------+--------------+ SFJ      Full                                                        +---------+---------------+---------+-----------+----------+--------------+ FV Prox  Full                                                        +---------+---------------+---------+-----------+----------+--------------+ FV Mid   Full                                                        +---------+---------------+---------+-----------+----------+--------------+ FV DistalFull                                                        +---------+---------------+---------+-----------+----------+--------------+  PFV      Full                                                        +---------+---------------+---------+-----------+----------+--------------+ POP      Full           Yes      Yes                                 +---------+---------------+---------+-----------+----------+--------------+ PTV      Full                                                        +---------+---------------+---------+-----------+----------+--------------+ PERO     Full                                                        +---------+---------------+---------+-----------+----------+--------------+   +---------+---------------+---------+-----------+----------+-------------------+ LEFT     CompressibilityPhasicitySpontaneityPropertiesThrombus Aging      +---------+---------------+---------+-----------+----------+-------------------+ CFV      None           No       No                   Age Indeterminate   +---------+---------------+---------+-----------+----------+-------------------+ SFJ      Full                                                             +---------+---------------+---------+-----------+----------+-------------------+ FV Prox  None                                          Age Indeterminate   +---------+---------------+---------+-----------+----------+-------------------+ FV Mid   None                                         Age Indeterminate   +---------+---------------+---------+-----------+----------+-------------------+ FV DistalNone                                         Age Indeterminate   +---------+---------------+---------+-----------+----------+-------------------+ PFV      None                                         Age Indeterminate   +---------+---------------+---------+-----------+----------+-------------------+ POP      None           Yes      Yes  Age Indeterminate   +---------+---------------+---------+-----------+----------+-------------------+ PTV      Full                                                             +---------+---------------+---------+-----------+----------+-------------------+ PERO                                                  Not well visualized +---------+---------------+---------+-----------+----------+-------------------+ IIV                     Yes      Yes                                      +---------+---------------+---------+-----------+----------+-------------------+    Summary: RIGHT: - There is no evidence of deep vein thrombosis in the lower extremity.  - No cystic structure found in the popliteal fossa.  LEFT: - Findings consistent with age indeterminate deep vein thrombosis involving the left common femoral vein, left proximal profunda vein, left popliteal vein, and left femoral vein.  *See table(s) above for measurements and observations. Electronically signed by Deitra Mayo MD on 03/25/2020 at 3:51:32 PM.    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 She underwent whole brain irradiation and currently undergoing treatment with targeted therapy with Tagrisso 160 mg p.o. daily because of the leptomeningeal disease status post 2 months.  She has been tolerating this treatment well with no concerning adverse effects except for fatigue and mild skin rash. She had repeat CT scan of the chest, abdomen pelvis performed recently.  I personally and independently reviewed the scan images and discussed the results and showed the images to the patient and her daughter today. Her scan showed significant improvement of her disease with decrease in the left lower lobe lung mass as well as the bilateral pulmonary nodules.  She also has healing process in the bone metastasis. The scan also showed pulmonary embolus in the right main pulmonary artery extending into the upper lobe lobar level branches and right lower lobe lobar level branches with borderline RV to LV ratio nearly 0.9.  We started the patient on anticoagulation with Eliquis 5 mg p.o. twice daily.  She also had Doppler of the lower extremities that showed findings consistent with age-indeterminate deep venous thrombosis involving the left common femoral vein, left proximal profunda vein, left popliteal vein and left femoral vein. I recommended for the patient to continue her current treatment with Tagrisso with the same dose. For the newly diagnosed pulmonary embolism, she will continue on Eliquis 5 mg p.o. twice daily. For the lack of appetite and dehydration, I will  arrange for the patient to receive IV hydration with normal saline in the clinic today. The patient will come back for follow-up visit in 2 weeks for  evaluation and repeat blood work. She was advised to call immediately if she has any concerning symptoms in the interval. The patient voices understanding of current disease status and treatment options and is in agreement with the current care plan.  All questions were answered. The patient knows to call the clinic with any problems, questions or concerns. We can certainly see the patient much sooner if necessary.  Disclaimer: This note was dictated with voice recognition software. Similar sounding words can inadvertently be transcribed and may not be corrected upon review.

## 2020-03-29 NOTE — Patient Instructions (Signed)

## 2020-03-29 NOTE — Addendum Note (Signed)
Addended by: Tora Kindred on: 03/29/2020 03:39 PM   Modules accepted: Orders

## 2020-03-29 NOTE — Telephone Encounter (Signed)
[facility] cannot take pt.

## 2020-03-30 DIAGNOSIS — I2693 Single subsegmental pulmonary embolism without acute cor pulmonale: Secondary | ICD-10-CM | POA: Diagnosis not present

## 2020-03-30 DIAGNOSIS — Z7901 Long term (current) use of anticoagulants: Secondary | ICD-10-CM | POA: Diagnosis not present

## 2020-03-30 DIAGNOSIS — R7989 Other specified abnormal findings of blood chemistry: Secondary | ICD-10-CM | POA: Diagnosis not present

## 2020-03-30 DIAGNOSIS — E039 Hypothyroidism, unspecified: Secondary | ICD-10-CM | POA: Diagnosis not present

## 2020-03-30 DIAGNOSIS — I82419 Acute embolism and thrombosis of unspecified femoral vein: Secondary | ICD-10-CM | POA: Diagnosis not present

## 2020-04-05 DIAGNOSIS — R300 Dysuria: Secondary | ICD-10-CM | POA: Diagnosis not present

## 2020-04-05 DIAGNOSIS — N3001 Acute cystitis with hematuria: Secondary | ICD-10-CM | POA: Diagnosis not present

## 2020-04-06 ENCOUNTER — Telehealth: Payer: Self-pay

## 2020-04-06 ENCOUNTER — Other Ambulatory Visit: Payer: Self-pay | Admitting: Physician Assistant

## 2020-04-06 DIAGNOSIS — R531 Weakness: Secondary | ICD-10-CM

## 2020-04-06 NOTE — Telephone Encounter (Signed)
Pt sone, Stefanie Camacho, called indicating pt is weak and not eating/drinking and wants to know if she can receive IVF.  When I called Stefanie Camacho back he advised the pt does not want to come and is refusing but he requests I call her directly.  I called the pt and she confirmed she does not want to come. I advised the pt I have already added her to the schedule and wanted to make sure she really didn't want the appt. Pt finally agreed to come to the appt. She was advised her arrival time of 11:45am.  Pt has been added to the schedule for tomorrow 04/07/20 at 12pm.

## 2020-04-07 ENCOUNTER — Inpatient Hospital Stay: Payer: Medicare HMO | Attending: Internal Medicine

## 2020-04-07 ENCOUNTER — Other Ambulatory Visit: Payer: Self-pay

## 2020-04-07 DIAGNOSIS — Z79899 Other long term (current) drug therapy: Secondary | ICD-10-CM | POA: Insufficient documentation

## 2020-04-07 DIAGNOSIS — C3432 Malignant neoplasm of lower lobe, left bronchus or lung: Secondary | ICD-10-CM | POA: Diagnosis not present

## 2020-04-07 DIAGNOSIS — E860 Dehydration: Secondary | ICD-10-CM | POA: Diagnosis not present

## 2020-04-07 DIAGNOSIS — Z923 Personal history of irradiation: Secondary | ICD-10-CM | POA: Insufficient documentation

## 2020-04-07 DIAGNOSIS — Z90710 Acquired absence of both cervix and uterus: Secondary | ICD-10-CM | POA: Diagnosis not present

## 2020-04-07 DIAGNOSIS — C7951 Secondary malignant neoplasm of bone: Secondary | ICD-10-CM | POA: Insufficient documentation

## 2020-04-07 DIAGNOSIS — I10 Essential (primary) hypertension: Secondary | ICD-10-CM | POA: Insufficient documentation

## 2020-04-07 DIAGNOSIS — F419 Anxiety disorder, unspecified: Secondary | ICD-10-CM | POA: Diagnosis not present

## 2020-04-07 DIAGNOSIS — Z8249 Family history of ischemic heart disease and other diseases of the circulatory system: Secondary | ICD-10-CM | POA: Diagnosis not present

## 2020-04-07 DIAGNOSIS — C7949 Secondary malignant neoplasm of other parts of nervous system: Secondary | ICD-10-CM | POA: Insufficient documentation

## 2020-04-07 DIAGNOSIS — C7931 Secondary malignant neoplasm of brain: Secondary | ICD-10-CM | POA: Diagnosis present

## 2020-04-07 DIAGNOSIS — Z7982 Long term (current) use of aspirin: Secondary | ICD-10-CM | POA: Insufficient documentation

## 2020-04-07 DIAGNOSIS — E039 Hypothyroidism, unspecified: Secondary | ICD-10-CM | POA: Diagnosis not present

## 2020-04-07 DIAGNOSIS — Z86711 Personal history of pulmonary embolism: Secondary | ICD-10-CM | POA: Insufficient documentation

## 2020-04-07 DIAGNOSIS — F32A Depression, unspecified: Secondary | ICD-10-CM | POA: Diagnosis not present

## 2020-04-07 DIAGNOSIS — G4730 Sleep apnea, unspecified: Secondary | ICD-10-CM | POA: Diagnosis not present

## 2020-04-07 DIAGNOSIS — E785 Hyperlipidemia, unspecified: Secondary | ICD-10-CM | POA: Diagnosis not present

## 2020-04-07 DIAGNOSIS — R531 Weakness: Secondary | ICD-10-CM

## 2020-04-07 DIAGNOSIS — K219 Gastro-esophageal reflux disease without esophagitis: Secondary | ICD-10-CM | POA: Insufficient documentation

## 2020-04-07 DIAGNOSIS — Z7901 Long term (current) use of anticoagulants: Secondary | ICD-10-CM | POA: Insufficient documentation

## 2020-04-07 DIAGNOSIS — R69 Illness, unspecified: Secondary | ICD-10-CM | POA: Diagnosis not present

## 2020-04-07 MED ORDER — SODIUM CHLORIDE 0.9 % IV SOLN
Freq: Once | INTRAVENOUS | Status: AC
  Filled 2020-04-07: qty 250

## 2020-04-07 NOTE — Patient Instructions (Signed)

## 2020-04-08 ENCOUNTER — Telehealth: Payer: Self-pay

## 2020-04-08 NOTE — Telephone Encounter (Signed)
Pt LM advising she received a refill of her Eliquis and it is for BID but she thought she was to take it QD.   I spoke with pt after reviewing her rx and found it was written by Dr. Karleen Hampshire and pt states she has had no response from the doctor. Per pt she does have a Cardiologist and I have advised her to follow-up with her Cardio or PCP for instructions on how she is to take the Eliquis as this was not prescribed by Dr. Julien Nordmann. Pt expressed understanding of this information.

## 2020-04-12 NOTE — Telephone Encounter (Signed)
Oral Oncology Patient Advocate Encounter  Met patient in [place] to complete re-enrollment application for [place] Patient Assistance Program in an effort to reduce the patient's out of pocket expense for Tagrisso to $0.    Application completed and faxed to [phone].   [place] patient [place] phone number for follow up is [phone].   This encounter will be updated until final determination.  [facility] Phone [phone] Fax [phone] 04/12/2020 2:28 PM

## 2020-04-13 ENCOUNTER — Encounter: Payer: Self-pay | Admitting: Internal Medicine

## 2020-04-13 ENCOUNTER — Telehealth: Payer: Self-pay | Admitting: Internal Medicine

## 2020-04-13 ENCOUNTER — Inpatient Hospital Stay: Payer: Medicare HMO | Admitting: Internal Medicine

## 2020-04-13 ENCOUNTER — Other Ambulatory Visit: Payer: Self-pay

## 2020-04-13 ENCOUNTER — Inpatient Hospital Stay: Payer: Medicare HMO

## 2020-04-13 VITALS — BP 112/62 | HR 84 | Temp 96.3°F | Resp 18 | Ht 66.0 in | Wt 169.3 lb

## 2020-04-13 DIAGNOSIS — C3492 Malignant neoplasm of unspecified part of left bronchus or lung: Secondary | ICD-10-CM

## 2020-04-13 DIAGNOSIS — C7949 Secondary malignant neoplasm of other parts of nervous system: Secondary | ICD-10-CM

## 2020-04-13 DIAGNOSIS — C3432 Malignant neoplasm of lower lobe, left bronchus or lung: Secondary | ICD-10-CM

## 2020-04-13 DIAGNOSIS — Z5111 Encounter for antineoplastic chemotherapy: Secondary | ICD-10-CM | POA: Diagnosis not present

## 2020-04-13 DIAGNOSIS — I10 Essential (primary) hypertension: Secondary | ICD-10-CM | POA: Diagnosis not present

## 2020-04-13 DIAGNOSIS — C7931 Secondary malignant neoplasm of brain: Secondary | ICD-10-CM | POA: Diagnosis not present

## 2020-04-13 DIAGNOSIS — E039 Hypothyroidism, unspecified: Secondary | ICD-10-CM | POA: Diagnosis not present

## 2020-04-13 LAB — CMP (CANCER CENTER ONLY)
ALT: 8 U/L (ref 0–44)
AST: 15 U/L (ref 15–41)
Albumin: 2.6 g/dL — ABNORMAL LOW (ref 3.5–5.0)
Alkaline Phosphatase: 131 U/L — ABNORMAL HIGH (ref 38–126)
Anion gap: 7 (ref 5–15)
BUN: 11 mg/dL (ref 8–23)
CO2: 24 mmol/L (ref 22–32)
Calcium: 9.4 mg/dL (ref 8.9–10.3)
Chloride: 104 mmol/L (ref 98–111)
Creatinine: 0.95 mg/dL (ref 0.44–1.00)
GFR, Estimated: >60 mL/min (ref >60)
Glucose, Bld: 100 mg/dL — ABNORMAL HIGH (ref 70–99)
Potassium: 3.8 mmol/L (ref 3.5–5.1)
Sodium: 135 mmol/L (ref 135–145)
Total Bilirubin: 0.4 mg/dL (ref 0.3–1.2)
Total Protein: 6.8 g/dL (ref 6.5–8.1)

## 2020-04-13 LAB — CBC WITH DIFFERENTIAL (CANCER CENTER ONLY)
Abs Immature Granulocytes: 0.02 10*3/uL (ref 0.00–0.07)
Basophils Absolute: 0 10*3/uL (ref 0.0–0.1)
Basophils Relative: 0 %
Eosinophils Absolute: 0 10*3/uL (ref 0.0–0.5)
Eosinophils Relative: 0 %
HCT: 37 % (ref 36.0–46.0)
Hemoglobin: 12.2 g/dL (ref 12.0–15.0)
Immature Granulocytes: 0 %
Lymphocytes Relative: 23 %
Lymphs Abs: 1.3 10*3/uL (ref 0.7–4.0)
MCH: 28.7 pg (ref 26.0–34.0)
MCHC: 33 g/dL (ref 30.0–36.0)
MCV: 87.1 fL (ref 80.0–100.0)
Monocytes Absolute: 0.5 10*3/uL (ref 0.1–1.0)
Monocytes Relative: 9 %
Neutro Abs: 3.8 10*3/uL (ref 1.7–7.7)
Neutrophils Relative %: 68 %
Platelet Count: 164 10*3/uL (ref 150–400)
RBC: 4.25 MIL/uL (ref 3.87–5.11)
RDW: 17.9 % — ABNORMAL HIGH (ref 11.5–15.5)
WBC Count: 5.6 10*3/uL (ref 4.0–10.5)
nRBC: 0 % (ref 0.0–0.2)

## 2020-04-13 NOTE — Telephone Encounter (Signed)
Scheduled per los. Gave avs and calendar  

## 2020-04-13 NOTE — Progress Notes (Signed)
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 2) pulmonary embolus in the right main pulmonary artery diagnosed in November 2021.  Biomarker Findings Microsatellite status - MS-Stable Tumor Mutational Burden - 1 Muts/Mb Genomic Findings For a complete list of the genes assayed, please refer to the Appendix. EGFR exon 19 deletion ([id number]) 7 Disease relevant genes with no reportable alterations: ALK, BRAF, ERBB2, KRAS, MET, RET, ROS1  PDL1 expression: 2%.  PRIOR THERAPY: Whole brain radiation and radiotherapy to the osseous metastases under the care of Dr. Lisbeth Renshaw. Last treatment expected on 02/02/20  CURRENT THERAPY:  1) Tagrisso160 mg p.o. daily. First dose on9/27/21. Status post 9 weeks of treatment. 2) Eliquis 5 mg p.o. twice daily  INTERVAL HISTORY: Stefanie Camacho 74 y.o. female returns to the clinic today for follow-up visit accompanied by her daughter.  The patient continues to have the generalized weakness and fatigue as well as cough productive of whitish sputum.  She also have dry heaves and she is taking Compazine every 6 hours as prophylactic but not as needed.  She has one episode of watery diarrhea daily improved with Imodium.  She also has decreased appetite and lost few more pounds since her last visit.  She has no current chest pain but has shortness of breath at baseline increased with exertion and she is currently on home oxygen.  She denied having any hemoptysis.  She has no headache or visual changes.  She  continues to tolerate her treatment with Tagrisso fairly well.  She continues to tolerate her treatment with Eliquis fairly well.   MEDICAL HISTORY: Past Medical History:  Diagnosis Date  . Anxiety   . Dyslipidemia   . Essential hypertension   . GERD (gastroesophageal reflux disease)   . Hypothyroidism   . Left bundle branch block 06/04/2018  . NICM (nonischemic cardiomyopathy) ([facility])   . Nonischemic cardiomyopathy ([facility]) 06/04/2018   Ejection fraction 45% in summer 2019  . Sleep apnea     ALLERGIES:  has No Known Allergies.  MEDICATIONS:  Current Outpatient Medications  Medication Sig Dispense Refill  . acetaminophen (TYLENOL) 500 MG tablet Take 1,000 mg by mouth every 6 (six) hours as needed for mild pain.    Marland Kitchen apixaban (ELIQUIS) 5 MG TABS tablet Take 1 tablet (5 mg total) by mouth 2 (two) times daily. 60 tablet 1  . atorvastatin (LIPITOR) 10 MG tablet Take 1 tablet (10 mg total) by mouth daily at 6 PM. 30 tablet 2  . FLUoxetine (PROZAC) 20 MG capsule Take 20 mg by mouth daily.    Marland Kitchen levETIRAcetam (KEPPRA) 500 MG tablet Take 1 tablet (500 mg total) by mouth 2 (two) times daily. 60 tablet 1  . levothyroxine (SYNTHROID) 75 MCG tablet Take 1 tablet (75 mcg total) by mouth daily. 30 tablet 1  . loperamide (IMODIUM) 2 MG capsule Take 2 mg by mouth as needed for diarrhea or loose stools.    Marland Kitchen losartan (COZAAR) 50 MG tablet Take 1 tablet (50 mg total) by mouth daily. 90 tablet 2  . metoprolol succinate (TOPROL-XL) 50  MG 24 hr tablet Take 1 tablet (50 mg total) by mouth daily. Take with or immediately following a meal. 90 tablet 2  . osimertinib mesylate (TAGRISSO) 80 MG tablet Take 2 tablets (160 mg total) by mouth daily. Days 1-28. 60 tablet 3  . pantoprazole (PROTONIX) 40 MG tablet Take 1 tablet (40 mg total) by mouth daily. 30 tablet 1  . prochlorperazine (COMPAZINE) 10 MG tablet Take 1 tablet (10 mg total) by mouth every 6 (six) hours as needed for nausea or vomiting. 45 tablet 5  .  spironolactone (ALDACTONE) 25 MG tablet Take 0.5 tablets (12.5 mg total) by mouth daily. 45 tablet 3   No current facility-administered medications for this visit.    SURGICAL HISTORY:  Past Surgical History:  Procedure Laterality Date  . ABDOMINAL HYSTERECTOMY    . BLADDER NECK RECONSTRUCTION    . BRONCHIAL BIOPSY  01/12/2020   Procedure: BRONCHIAL BIOPSIES;  Surgeon: Garner Nash, DO;  Location: [facility];  Service: Pulmonary;;  . BRONCHIAL BRUSHINGS  01/12/2020   Procedure: BRONCHIAL BRUSHINGS;  Surgeon: Garner Nash, DO;  Location: [facility];  Service: Pulmonary;;  . BRONCHIAL NEEDLE ASPIRATION BIOPSY  01/12/2020   Procedure: BRONCHIAL NEEDLE ASPIRATION BIOPSIES;  Surgeon: Garner Nash, DO;  Location: [facility];  Service: Pulmonary;;  . BRONCHIAL WASHINGS  01/12/2020   Procedure: BRONCHIAL WASHINGS;  Surgeon: Garner Nash, DO;  Location: [facility];  Service: Pulmonary;;  . SHOULDER SURGERY    . TONSILLECTOMY    . VIDEO BRONCHOSCOPY WITH ENDOBRONCHIAL ULTRASOUND  01/12/2020   Procedure: VIDEO BRONCHOSCOPY WITH ENDOBRONCHIAL ULTRASOUND;  Surgeon: Garner Nash, DO;  Location: [facility];  Service: Pulmonary;;    REVIEW OF SYSTEMS:  Constitutional: positive for anorexia, fatigue and weight loss Eyes: negative Ears, nose, mouth, throat, and face: negative Respiratory: positive for cough and dyspnea on exertion Cardiovascular: negative Gastrointestinal: positive for diarrhea and nausea Genitourinary:negative Integument/breast: negative Hematologic/lymphatic: negative Musculoskeletal:positive for muscle weakness Neurological: negative Behavioral/Psych: negative Endocrine: negative Allergic/Immunologic: negative   PHYSICAL EXAMINATION: General appearance: alert, cooperative, fatigued and no distress Head: Normocephalic, without obvious abnormality, atraumatic Neck: no adenopathy, no JVD, supple, symmetrical, trachea midline and thyroid not enlarged,  symmetric, no tenderness/mass/nodules Lymph nodes: Cervical, supraclavicular, and axillary nodes normal. Resp: clear to auscultation bilaterally Back: symmetric, no curvature. ROM normal. No CVA tenderness. Cardio: regular rate and rhythm, S1, S2 normal, no murmur, click, rub or gallop GI: soft, non-tender; bowel sounds normal; no masses,  no organomegaly Extremities: extremities normal, atraumatic, no cyanosis or edema Neurologic: Alert and oriented X 3, normal strength and tone. Normal symmetric reflexes. Normal coordination and gait  ECOG PERFORMANCE STATUS: 2 - Symptomatic, <50% confined to bed  Blood pressure 112/62, pulse 84, temperature (!) 96.3 F (35.7 C), temperature source Tympanic, resp. rate 18, height '5\' 6"'  (1.676 m), weight 169 lb 4.8 oz (76.8 kg), SpO2 92 %.  LABORATORY DATA: Lab Results  Component Value Date   WBC 5.6 04/13/2020   HGB 12.2 04/13/2020   HCT 37.0 04/13/2020   MCV 87.1 04/13/2020   PLT 164 04/13/2020      Chemistry      Component Value Date/Time   NA 135 04/13/2020 1012   K 3.8 04/13/2020 1012   CL 104 04/13/2020 1012   CO2 24 04/13/2020 1012   BUN 11 04/13/2020 1012   CREATININE 0.95 04/13/2020 1012      Component Value Date/Time   CALCIUM 9.4 04/13/2020 1012   ALKPHOS 131 (H) 04/13/2020  1012   AST 15 04/13/2020 1012   ALT 8 04/13/2020 1012   BILITOT 0.4 04/13/2020 1012       RADIOGRAPHIC STUDIES: CT Chest W Contrast  Result Date: 03/25/2020 CLINICAL DATA:  Non-small cell lung cancer, metastatic assess treatment response. EXAM: CT CHEST, ABDOMEN, AND PELVIS WITH CONTRAST TECHNIQUE: Multidetector CT imaging of the chest, abdomen and pelvis was performed following the standard protocol during bolus administration of intravenous contrast. CONTRAST:  194m OMNIPAQUE IOHEXOL 300 MG/ML  SOLN COMPARISON:  Comparison study of 01/10/2020 FINDINGS: CT CHEST FINDINGS Cardiovascular: Heart size moderately enlarged. RV to LV ratio of 0.86. Thoracic  aorta is normal caliber with smooth contour. Pulmonary embolus in the RIGHT main pulmonary artery extending into upper lobe lobar level branches and lower lobe lobar level branches. Mediastinum/Nodes: Thoracic inlet structures are normal.  The No axillary lymphadenopathy. No mediastinal lymphadenopathy. No hilar lymphadenopathy. Lungs/Pleura: Marked interval decrease in the number of well-defined nodules in the chest since January 10, 2020. Diminished size of LEFT lower lobe pulmonary mass since the prior study. (Image 94, series 4) 2.7 x 2.6 cm LEFT lower lobe pulmonary nodule previously pulmonary mass measuring approximately 4.7 x 5.1 cm. Signs of septal thickening have developed since the previous study along with ground-glass in the lingula and LEFT lower lobe. (Image 91, series 4) 3.1 x 1.8 cm area of peripheral consolidative change with central lucency in the RIGHT lower lobe along the fissure abutting the pleura. Similar peripheral area of density in the RIGHT upper lobe 1.9 x 1.6 cm (image 36, series 4) the well-defined nodules that were centrally innumerable on the prior study have diminished in number and there are numerous areas of ground-glass nodularity throughout the chest in place of these nodules with more confluent ground-glass as discussed above. There is no pleural effusion. Airways are patent. Musculoskeletal: See below for full musculoskeletal details. 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 New small area of low density in the LEFT hepatic lobe (image 45, series 2) 13 mm. In hindsight there is very subtle heterogeneity in this area on the prior study, perhaps this represents treated metastatic lesion. There is also a subtle area of low attenuation in the RIGHT hepatic lobe not apparent on the prior study (image 60, series 2) 9 mm. Gallbladder without signs of pericholecystic stranding. No biliary duct dilation. Pancreas: Pancreatic atrophy without ductal  dilation, sign of inflammation or focal lesion. Spleen: Spleen normal in size and contour without focal lesion. Adrenals/Urinary Tract: Adrenal glands are normal. Symmetric renal enhancement. No hydronephrosis. Urinary bladder under distended. This limits assessment. Cysts in the RIGHT kidney similar to prior imaging. Stomach/Bowel: No acute gastrointestinal process.  Normal appendix. Vascular/Lymphatic: Patent abdominal vasculature. Scattered atheromatous plaque in the abdominal aorta. No aneurysmal dilation. There is no gastrohepatic or hepatoduodenal ligament lymphadenopathy. No retroperitoneal or mesenteric lymphadenopathy. No pelvic sidewall lymphadenopathy. Engorgement of RIGHT internal iliac vein and deep pelvic veins along the RIGHT pelvic sidewall. Variable attenuation with some areas suggesting central low attenuation, for instance on image 106 of series 2. Reproductive: Post hysterectomy.  No adnexal mass. Other: No hernia.  No ascites. Musculoskeletal: Metastatic lesions in ribs, RIGHT 6 rib with interval sclerosis and healing, further healing of a pathologic fracture on image 93 of series 4 New subacute fracture with similar appearance and healing at the site of lucency in the posterior ninth rib. Adjacent sclerotic focus similar to the prior study. No acute displaced rib fractures. Subtle sternal sclerotic foci in the  sternal manubrium. New sclerotic foci LEFT humeral head just less than a cm size (image 11, series 4) Is lucent area in the RIGHT iliac on the prior study is now partially sclerotic (image 100, series 2) measuring approximately 2.8 cm. Irregularity of the RIGHT pubic root with sclerosis is similar to the previous exam. (Image 110, series 2) this extends into the acetabulum at this level Numerous new sclerotic foci throughout the pelvis and spine Numerous new sclerotic foci scattered throughout visualized skeleton, for instance on image 117 of series 2 in the proximal LEFT femur. A subtle  lucent area seen just posterior to this and inferior to this on image 119 of series 2 is an example of a sclerotic focus that has developed at the site of previous lucency. Further sclerosis at the margin of a sacral metastasis on image 94 of series 2 adjacent to the central canal along the sacrum. Numerous foci of sclerosis have developed in the spine. Is for instance, in hindsight an area of very subtle lucency in the spine at the T7 level shows dense sclerosis (image 101, series 6), nearly all vertebral levels are involved. IMPRESSION: 1. Pulmonary embolus in the RIGHT main pulmonary artery extending into upper lobe lobar level branches and RIGHT lower lobe lobar level branches. Borderline RV to LV ratio nearly 0.9, RIGHT heart with some enlargement on the previous study. This is of uncertain clinical significance but could be seen in the early setting of RIGHT heart strain. Consider correlation with symptoms and echocardiography as warranted given that there is considerable clot in the RIGHT main pulmonary artery and its branches. 2. New areas of nodularity/consolidation in the RIGHT chest more likely related to pulmonary infarcts. Attention on follow-up 3. Marked interval response to therapy in the chest as described. 4. Nearly confluent ground-glass in the LEFT chest may represent post treatment changes given that nodularity elsewhere has transitioned from solid to ground-glass appearance. Disease was more extensive in this area which could explain these findings. Close attention on follow-up is suggested to exclude the possibility of superimposed pneumonitis. 5. Subtle lesions in the liver not apparent on previous imaging may represent treated, previously radiographically occult disease given other findings on the current study. Attention on follow-up is suggested. 6. Irregularity of the RIGHT pubic root could be at risk for pathologic fracture but is similar to the prior imaging study. 7. Signs of pathologic  fracture in ribs with interval healing as described. Numerous new foci of sclerosis involving nearly all visualized bones could represent interval response to therapy of pre-existing occult metastatic foci. Close attention on follow-up is suggested. 8. Engorgement of RIGHT iliac, internal iliac and deep pelvic veins, this may represent the source of the patient's embolus. 9. Aortic atherosclerosis. Critical Value/emergent results were called by telephone at the time of interpretation on 03/25/2020 at 08:30 a.m. to provider [facility] , who verbally acknowledged these results. Other nonemergent findings were communicated via the PRA as outlined in the Z vision dash board. Aortic Atherosclerosis (ICD10-I70.0). Electronically Signed   By: Zetta Bills M.D.   On: 03/25/2020 08:58   CT Abdomen Pelvis W Contrast  Result Date: 03/25/2020 CLINICAL DATA:  Non-small cell lung cancer, metastatic assess treatment response. EXAM: CT CHEST, ABDOMEN, AND PELVIS WITH CONTRAST TECHNIQUE: Multidetector CT imaging of the chest, abdomen and pelvis was performed following the standard protocol during bolus administration of intravenous contrast. CONTRAST:  157m OMNIPAQUE IOHEXOL 300 MG/ML  SOLN COMPARISON:  Comparison study of 01/10/2020 FINDINGS: CT CHEST  FINDINGS Cardiovascular: Heart size moderately enlarged. RV to LV ratio of 0.86. Thoracic aorta is normal caliber with smooth contour. Pulmonary embolus in the RIGHT main pulmonary artery extending into upper lobe lobar level branches and lower lobe lobar level branches. Mediastinum/Nodes: Thoracic inlet structures are normal.  The No axillary lymphadenopathy. No mediastinal lymphadenopathy. No hilar lymphadenopathy. Lungs/Pleura: Marked interval decrease in the number of well-defined nodules in the chest since January 10, 2020. Diminished size of LEFT lower lobe pulmonary mass since the prior study. (Image 94, series 4) 2.7 x 2.6 cm LEFT lower lobe pulmonary  nodule previously pulmonary mass measuring approximately 4.7 x 5.1 cm. Signs of septal thickening have developed since the previous study along with ground-glass in the lingula and LEFT lower lobe. (Image 91, series 4) 3.1 x 1.8 cm area of peripheral consolidative change with central lucency in the RIGHT lower lobe along the fissure abutting the pleura. Similar peripheral area of density in the RIGHT upper lobe 1.9 x 1.6 cm (image 36, series 4) the well-defined nodules that were centrally innumerable on the prior study have diminished in number and there are numerous areas of ground-glass nodularity throughout the chest in place of these nodules with more confluent ground-glass as discussed above. There is no pleural effusion. Airways are patent. Musculoskeletal: See below for full musculoskeletal details. 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 New small area of low density in the LEFT hepatic lobe (image 45, series 2) 13 mm. In hindsight there is very subtle heterogeneity in this area on the prior study, perhaps this represents treated metastatic lesion. There is also a subtle area of low attenuation in the RIGHT hepatic lobe not apparent on the prior study (image 60, series 2) 9 mm. Gallbladder without signs of pericholecystic stranding. No biliary duct dilation. Pancreas: Pancreatic atrophy without ductal dilation, sign of inflammation or focal lesion. Spleen: Spleen normal in size and contour without focal lesion. Adrenals/Urinary Tract: Adrenal glands are normal. Symmetric renal enhancement. No hydronephrosis. Urinary bladder under distended. This limits assessment. Cysts in the RIGHT kidney similar to prior imaging. Stomach/Bowel: No acute gastrointestinal process.  Normal appendix. Vascular/Lymphatic: Patent abdominal vasculature. Scattered atheromatous plaque in the abdominal aorta. No aneurysmal dilation. There is no gastrohepatic or hepatoduodenal ligament  lymphadenopathy. No retroperitoneal or mesenteric lymphadenopathy. No pelvic sidewall lymphadenopathy. Engorgement of RIGHT internal iliac vein and deep pelvic veins along the RIGHT pelvic sidewall. Variable attenuation with some areas suggesting central low attenuation, for instance on image 106 of series 2. Reproductive: Post hysterectomy.  No adnexal mass. Other: No hernia.  No ascites. Musculoskeletal: Metastatic lesions in ribs, RIGHT 6 rib with interval sclerosis and healing, further healing of a pathologic fracture on image 93 of series 4 New subacute fracture with similar appearance and healing at the site of lucency in the posterior ninth rib. Adjacent sclerotic focus similar to the prior study. No acute displaced rib fractures. Subtle sternal sclerotic foci in the sternal manubrium. New sclerotic foci LEFT humeral head just less than a cm size (image 11, series 4) Is lucent area in the RIGHT iliac on the prior study is now partially sclerotic (image 100, series 2) measuring approximately 2.8 cm. Irregularity of the RIGHT pubic root with sclerosis is similar to the previous exam. (Image 110, series 2) this extends into the acetabulum at this level Numerous new sclerotic foci throughout the pelvis and spine Numerous new sclerotic foci scattered throughout visualized skeleton, for instance on image 117 of series 2 in the  proximal LEFT femur. A subtle lucent area seen just posterior to this and inferior to this on image 119 of series 2 is an example of a sclerotic focus that has developed at the site of previous lucency. Further sclerosis at the margin of a sacral metastasis on image 94 of series 2 adjacent to the central canal along the sacrum. Numerous foci of sclerosis have developed in the spine. Is for instance, in hindsight an area of very subtle lucency in the spine at the T7 level shows dense sclerosis (image 101, series 6), nearly all vertebral levels are involved. IMPRESSION: 1. Pulmonary embolus in  the RIGHT main pulmonary artery extending into upper lobe lobar level branches and RIGHT lower lobe lobar level branches. Borderline RV to LV ratio nearly 0.9, RIGHT heart with some enlargement on the previous study. This is of uncertain clinical significance but could be seen in the early setting of RIGHT heart strain. Consider correlation with symptoms and echocardiography as warranted given that there is considerable clot in the RIGHT main pulmonary artery and its branches. 2. New areas of nodularity/consolidation in the RIGHT chest more likely related to pulmonary infarcts. Attention on follow-up 3. Marked interval response to therapy in the chest as described. 4. Nearly confluent ground-glass in the LEFT chest may represent post treatment changes given that nodularity elsewhere has transitioned from solid to ground-glass appearance. Disease was more extensive in this area which could explain these findings. Close attention on follow-up is suggested to exclude the possibility of superimposed pneumonitis. 5. Subtle lesions in the liver not apparent on previous imaging may represent treated, previously radiographically occult disease given other findings on the current study. Attention on follow-up is suggested. 6. Irregularity of the RIGHT pubic root could be at risk for pathologic fracture but is similar to the prior imaging study. 7. Signs of pathologic fracture in ribs with interval healing as described. Numerous new foci of sclerosis involving nearly all visualized bones could represent interval response to therapy of pre-existing occult metastatic foci. Close attention on follow-up is suggested. 8. Engorgement of RIGHT iliac, internal iliac and deep pelvic veins, this may represent the source of the patient's embolus. 9. Aortic atherosclerosis. Critical Value/emergent results were called by telephone at the time of interpretation on 03/25/2020 at 08:30 a.m. to provider [facility] , who verbally  acknowledged these results. Other nonemergent findings were communicated via the PRA as outlined in the Z vision dash board. Aortic Atherosclerosis (ICD10-I70.0). Electronically Signed   By: Zetta Bills M.D.   On: 03/25/2020 08:58   ECHOCARDIOGRAM COMPLETE  Result Date: 03/25/2020    ECHOCARDIOGRAM REPORT   Patient Name:   Stefanie Camacho Date of Exam: 03/25/2020 Medical Rec #:  [id number]     Height:       66.0 in Accession #:    [id number]    Weight:       179.4 lb Date of Birth:  [birth year]     BSA:          1.910 m Patient Age:    56 years      BP:           135/57 mmHg Patient Gender: F             HR:           54 bpm. Exam Location:  Inpatient Procedure: 2D Echo STAT ECHO Indications:    Pulmonary Embolus I26.99  History:        Patient has prior history  of Echocardiogram examinations, most                 recent 01/09/2020. Non-ischemic Cardiomyopathy; Risk                 Factors:Hypertension and Dyslipidemia.  Sonographer:    Mikki Santee RDCS (AE) Referring Phys: [id number] [place]  1. Left ventricular ejection fraction, by estimation, is 55 to 60%. The left ventricle has normal function. The left ventricle has no regional wall motion abnormalities. Left ventricular diastolic parameters are consistent with Grade I diastolic dysfunction (impaired relaxation).  2. Right ventricular systolic function is normal. The right ventricular size is normal. There is normal pulmonary artery systolic pressure. The estimated right ventricular systolic pressure is 91.6 mmHg.  3. The mitral valve is grossly normal. Mild mitral valve regurgitation. No evidence of mitral stenosis.  4. The aortic valve is tricuspid. Aortic valve regurgitation is not visualized. No aortic stenosis is present.  5. The inferior vena cava is normal in size with greater than 50% respiratory variability, suggesting right atrial pressure of 3 mmHg. Comparison(s): No significant change from prior study. FINDINGS  Left  Ventricle: Left ventricular ejection fraction, by estimation, is 55 to 60%. The left ventricle has normal function. The left ventricle has no regional wall motion abnormalities. The left ventricular internal cavity size was normal in size. There is  no left ventricular hypertrophy. Left ventricular diastolic parameters are consistent with Grade I diastolic dysfunction (impaired relaxation). Normal left ventricular filling pressure. Right Ventricle: The right ventricular size is normal. No increase in right ventricular wall thickness. Right ventricular systolic function is normal. There is normal pulmonary artery systolic pressure. The tricuspid regurgitant velocity is 1.99 m/s, and  with an assumed right atrial pressure of 3 mmHg, the estimated right ventricular systolic pressure is 60.6 mmHg. Left Atrium: Left atrial size was normal in size. Right Atrium: Right atrial size was normal in size. Pericardium: Trivial pericardial effusion is present. Presence of pericardial fat pad. Mitral Valve: The mitral valve is grossly normal. Mild mitral valve regurgitation. No evidence of mitral valve stenosis. Tricuspid Valve: The tricuspid valve is grossly normal. Tricuspid valve regurgitation is trivial. No evidence of tricuspid stenosis. Aortic Valve: The aortic valve is tricuspid. Aortic valve regurgitation is not visualized. No aortic stenosis is present. Pulmonic Valve: The pulmonic valve was grossly normal. Pulmonic valve regurgitation is trivial. No evidence of pulmonic stenosis. Aorta: The aortic root and ascending aorta are structurally normal, with no evidence of dilitation. Venous: The right upper pulmonary vein is normal. The inferior vena cava is normal in size with greater than 50% respiratory variability, suggesting right atrial pressure of 3 mmHg. IAS/Shunts: The atrial septum is grossly normal.  LEFT VENTRICLE PLAX 2D LVIDd:         4.80 cm  Diastology LVIDs:         3.70 cm  LV e' medial:    4.98 cm/s LV PW:          0.90 cm  LV E/e' medial:  14.4 LV IVS:        0.90 cm  LV e' lateral:   6.48 cm/s LVOT diam:     2.20 cm  LV E/e' lateral: 11.1 LV SV:         60 LV SV Index:   31 LVOT Area:     3.80 cm  RIGHT VENTRICLE RV S prime:     10.00 cm/s TAPSE (M-mode): 1.9 cm LEFT ATRIUM  Index       RIGHT ATRIUM           Index LA diam:        3.80 cm 1.99 cm/m  RA Area:     11.00 cm LA Vol (A2C):   74.4 ml 38.96 ml/m RA Volume:   20.00 ml  10.47 ml/m LA Vol (A4C):   57.8 ml 30.27 ml/m LA Biplane Vol: 67.6 ml 35.40 ml/m  AORTIC VALVE LVOT Vmax:   73.10 cm/s LVOT Vmean:  42.900 cm/s LVOT VTI:    0.158 m  AORTA Ao Root diam: 2.70 cm MITRAL VALVE               TRICUSPID VALVE MV Area (PHT): 2.48 cm    TR Peak grad:   15.8 mmHg MV Decel Time: 306 msec    TR Vmax:        199.00 cm/s MR Peak grad: 117.9 mmHg MR Mean grad: 74.0 mmHg    SHUNTS MR Vmax:      543.00 cm/s  Systemic VTI:  0.16 m MR Vmean:     410.0 cm/s   Systemic Diam: 2.20 cm MV E velocity: 71.80 cm/s MV A velocity: 97.50 cm/s MV E/A ratio:  0.74 Eleonore Chiquito MD Electronically signed by Eleonore Chiquito MD Signature Date/Time: 03/25/2020/11:46:59 AM    Final    VAS [place] LOWER EXTREMITY VENOUS (DVT)  Result Date: 03/25/2020  Lower Venous DVT Study Indications: Swelling, and Pain.  Comparison Study: 01/09/20 previous negative Performing Technologist: Abram Sander RVS  Examination Guidelines: A complete evaluation includes B-mode imaging, spectral Doppler, color Doppler, and power Doppler as needed of all accessible portions of each vessel. Bilateral testing is considered an integral part of a complete examination. Limited examinations for reoccurring indications may be performed as noted. The reflux portion of the exam is performed with the patient in reverse Trendelenburg.  +---------+---------------+---------+-----------+----------+--------------+ RIGHT    CompressibilityPhasicitySpontaneityPropertiesThrombus Aging  +---------+---------------+---------+-----------+----------+--------------+ CFV      Full           Yes      Yes                                 +---------+---------------+---------+-----------+----------+--------------+ SFJ      Full                                                        +---------+---------------+---------+-----------+----------+--------------+ FV Prox  Full                                                        +---------+---------------+---------+-----------+----------+--------------+ FV Mid   Full                                                        +---------+---------------+---------+-----------+----------+--------------+ FV DistalFull                                                        +---------+---------------+---------+-----------+----------+--------------+  PFV      Full                                                        +---------+---------------+---------+-----------+----------+--------------+ POP      Full           Yes      Yes                                 +---------+---------------+---------+-----------+----------+--------------+ PTV      Full                                                        +---------+---------------+---------+-----------+----------+--------------+ PERO     Full                                                        +---------+---------------+---------+-----------+----------+--------------+   +---------+---------------+---------+-----------+----------+-------------------+ LEFT     CompressibilityPhasicitySpontaneityPropertiesThrombus Aging      +---------+---------------+---------+-----------+----------+-------------------+ CFV      None           No       No                   Age Indeterminate   +---------+---------------+---------+-----------+----------+-------------------+ SFJ      Full                                                              +---------+---------------+---------+-----------+----------+-------------------+ FV Prox  None                                         Age Indeterminate   +---------+---------------+---------+-----------+----------+-------------------+ FV Mid   None                                         Age Indeterminate   +---------+---------------+---------+-----------+----------+-------------------+ FV DistalNone                                         Age Indeterminate   +---------+---------------+---------+-----------+----------+-------------------+ PFV      None                                         Age Indeterminate   +---------+---------------+---------+-----------+----------+-------------------+ POP      None           Yes      Yes  Age Indeterminate   +---------+---------------+---------+-----------+----------+-------------------+ PTV      Full                                                             +---------+---------------+---------+-----------+----------+-------------------+ PERO                                                  Not well visualized +---------+---------------+---------+-----------+----------+-------------------+ IIV                     Yes      Yes                                      +---------+---------------+---------+-----------+----------+-------------------+    Summary: RIGHT: - There is no evidence of deep vein thrombosis in the lower extremity.  - No cystic structure found in the popliteal fossa.  LEFT: - Findings consistent with age indeterminate deep vein thrombosis involving the left common femoral vein, left proximal profunda vein, left popliteal vein, and left femoral vein.  *See table(s) above for measurements and observations. Electronically signed by Deitra Mayo MD on 03/25/2020 at 3:51:32 PM.    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 She underwent whole brain irradiation and currently undergoing treatment with targeted therapy with Tagrisso 160 mg p.o. daily because of the leptomeningeal disease status post 2.5 months.  The patient has been tolerating this treatment well except for occasional episodes of diarrhea and dry heaves.  She is currently on Imodium as needed for the diarrhea and Compazine for the nausea.  I advised her to take her Compazine on as-needed basis rather than scheduled. I recommended for her to continue her current treatment with Tagrisso with the same dose. She is supposed to have repeat MRI of the brain next week for evaluation of the brain metastasis and leptomeningeal disease. For the pulmonary embolism, she will continue her treatment with Eliquis 5 mg p.o. twice daily. For the lack of appetite and dehydration, she received IV hydration on as-needed basis and she was seen by the dietitian at the cancer center for advice regarding her nutritional status. For the depression, she is currently on Prozac 10 mg p.o. daily and she is seeing her primary care physician soon for adjustment of her dose. The patient will come back for follow-up visit in 2 weeks for evaluation and repeat blood work. She was advised to call if she has any other concerning symptoms in the interval.  The patient voices understanding of current disease status and treatment options and is in agreement with the current care plan.  All questions were answered.  The patient knows to call the clinic with any problems, questions or  concerns. We can certainly see the patient much sooner if necessary.  Disclaimer: This note was dictated with voice recognition software. Similar sounding words can inadvertently be transcribed and may not be corrected upon review.

## 2020-04-15 ENCOUNTER — Telehealth: Payer: Self-pay | Admitting: Cardiology

## 2020-04-15 MED ORDER — ATORVASTATIN CALCIUM 10 MG PO TABS: 10.0000 mg | ORAL_TABLET | Freq: Every day | ORAL | 3 refills | Status: DC

## 2020-04-15 NOTE — Telephone Encounter (Signed)
Refill sent in per request.  

## 2020-04-15 NOTE — Telephone Encounter (Signed)
   *  STAT* If patient is at the pharmacy, call can be transferred to refill team.   1. Which medications need to be refilled? (please list name of each medication and dose if known)   atorvastatin (LIPITOR) 10 MG tablet    2. Which pharmacy/location (including street and city if local pharmacy) is medication to be sent to? [facility], [place] [employer]  3. Do they need a 30 day or 90 day supply? 90 days

## 2020-04-17 DIAGNOSIS — I10 Essential (primary) hypertension: Secondary | ICD-10-CM | POA: Diagnosis not present

## 2020-04-17 DIAGNOSIS — C771 Secondary and unspecified malignant neoplasm of intrathoracic lymph nodes: Secondary | ICD-10-CM | POA: Diagnosis not present

## 2020-04-17 DIAGNOSIS — I428 Other cardiomyopathies: Secondary | ICD-10-CM | POA: Diagnosis not present

## 2020-04-17 DIAGNOSIS — C7932 Secondary malignant neoplasm of cerebral meninges: Secondary | ICD-10-CM | POA: Diagnosis not present

## 2020-04-17 DIAGNOSIS — G4700 Insomnia, unspecified: Secondary | ICD-10-CM | POA: Diagnosis not present

## 2020-04-17 DIAGNOSIS — E039 Hypothyroidism, unspecified: Secondary | ICD-10-CM | POA: Diagnosis not present

## 2020-04-17 DIAGNOSIS — C7951 Secondary malignant neoplasm of bone: Secondary | ICD-10-CM | POA: Diagnosis not present

## 2020-04-17 DIAGNOSIS — N1831 Chronic kidney disease, stage 3a: Secondary | ICD-10-CM | POA: Diagnosis not present

## 2020-04-17 DIAGNOSIS — C3432 Malignant neoplasm of lower lobe, left bronchus or lung: Secondary | ICD-10-CM | POA: Diagnosis not present

## 2020-04-17 DIAGNOSIS — I129 Hypertensive chronic kidney disease with stage 1 through stage 4 chronic kidney disease, or unspecified chronic kidney disease: Secondary | ICD-10-CM | POA: Diagnosis not present

## 2020-04-17 DIAGNOSIS — I2699 Other pulmonary embolism without acute cor pulmonale: Secondary | ICD-10-CM | POA: Diagnosis not present

## 2020-04-17 NOTE — Progress Notes (Signed)
  Radiation Oncology         [phone] ________________________________  Name: Stefanie Camacho MRN: [id number]  Date: 02/02/2020  DOB: 1946/02/28  End of Treatment Note  Diagnosis:   bone metastasis     Indication for treatment::  palliative       Radiation treatment dates:   01/18/20 - 02/02/20  Site/dose:   1. The whole brain was treated to a dose of 30 Gy in 10 fractions using an isodose plan. This consisted of 4 fields. 2. The left lung was treated to a dose of 30 Gy in 10 fractions using an 3D conformal plan. This consisted of 3 fields. 3. The left pelvis was treated to a dose of 30 Gy in 10 fractions using an isodose plan. This consisted of 2 fields. 4. The right pelvis was treated to a dose of 30 Gy in 10 fractions using an isodose plan. This consisted of 2 fields.    Narrative: The patient tolerated radiation treatment relatively well.    Plan: The patient has completed radiation treatment. The patient will return to radiation oncology clinic for routine followup in one month. I advised the patient to call or return sooner if they have any questions or concerns related to their recovery or treatment. ________________________________  Jodelle Gross, M.D., Ph.D.

## 2020-04-19 NOTE — Progress Notes (Signed)
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  Molecular Biomarkers:Positive for EGFR mutation with deletion in exon 19   PDL1 expression: 2%.  PRIOR THERAPY: Whole brain radiation and radiotherapy to the brain metastases under the care of Dr. Lisbeth Renshaw. Last treatment on 02/02/20  CURRENT THERAPY:  1) Tagrisso160 mg p.o. daily. First dose on9/27/21. Status post 9 weeks of treatment. 2) Eliquis 5 mg p.o. twice daily  INTERVAL HISTORY: Stefanie Camacho 74 y.o. female returns to the clinic today for a follow-up visit accompanied by aa family member. The patient's main concern today is related to a fall which occurred on 04/23/2020.  The patient states that she was in the bathroom and felt dizzy and fell. She has been having vertigo for about a year.  She is endorsing some right hip pain.  The patient states that she is able to move her right hip and is able to bear weight.  Of note, the patient has multiple osseous metastases, particularly in the pelvis and follows with orthopedic physician Dr. Lorin Mercy for this and her risk for fracture.  She also notes that she fell approximately 3 weeks ago and was having midline lumbar pain.  The patient undergoes physical therapy 2 times a week. She also has a bedside commode if needed.   The patient had a follow-up brain MRI recently for her history of brain metastases/leptomeningeal metastases.  She follows up with neuro oncology and has an appointment with them today to review the results which shows improvement in her disease to the brain.   Otherwise she denies  any fever or night sweats.  She reports that she is always cold.  Her weight is stable but continues to have a poor appetite.  She has been followed by member of the nutritionist team in the past.  She reports her baseline dyspnea on exertion.  She is on supplemental oxygen.  She is also taking Eliquis for recent pulmonary embolism which she is tolerating without any abnormal bleeding or bruising.  She notes that her cough is essentially gone at this time.  She denies any chest pain.  She states that she has "practically none" of nausea or vomiting.  She has approximately 1 loose stool daily for which she takes Imodium.   The patient continues to endorse dry heaves for which she takes Compazine prophylactically every 6 hours.  She also receives IV hydration on an as-needed basis.  She was previously seen by member the dietitian team for her decreased appetite and weight loss. The patient is here today for evaluation and repeat blood work.    MEDICAL HISTORY: Past Medical History:  Diagnosis Date  . Anxiety   . Dyslipidemia   . Essential hypertension   . GERD (gastroesophageal reflux disease)   . Hypothyroidism   . Left bundle branch block 06/04/2018  . NICM (nonischemic cardiomyopathy) ([place])   . Nonischemic cardiomyopathy ([place]) 06/04/2018   Ejection fraction 45% in summer 2019  . Sleep apnea     ALLERGIES:  has No Known Allergies.  MEDICATIONS:  Current Outpatient Medications  Medication Sig Dispense Refill  . acetaminophen (TYLENOL) 500 MG tablet Take  1,000 mg by mouth every 6 (six) hours as needed for mild pain.    Marland Kitchen acetaminophen-codeine (TYLENOL #3) 300-30 MG tablet 1 tablet as needed    . apixaban (ELIQUIS) 5 MG TABS tablet Take 1 tablet (5 mg total) by mouth 2 (two) times daily. 60 tablet 1  . atorvastatin (LIPITOR) 10 MG tablet Take 1 tablet (10 mg total) by mouth daily at 6 PM. 90 tablet 3  . FLUoxetine (PROZAC) 20 MG capsule Take 20 mg by mouth daily.    Marland Kitchen levETIRAcetam (KEPPRA)  500 MG tablet Take 1 tablet (500 mg total) by mouth 2 (two) times daily. 60 tablet 1  . levothyroxine (SYNTHROID) 75 MCG tablet Take 1 tablet (75 mcg total) by mouth daily. 30 tablet 1  . loperamide (IMODIUM) 2 MG capsule Take 2 mg by mouth as needed for diarrhea or loose stools.    Marland Kitchen losartan (COZAAR) 50 MG tablet Take 1 tablet (50 mg total) by mouth daily. 90 tablet 2  . metoprolol succinate (TOPROL-XL) 50 MG 24 hr tablet Take 1 tablet (50 mg total) by mouth daily. Take with or immediately following a meal. 90 tablet 2  . osimertinib mesylate (TAGRISSO) 80 MG tablet Take 2 tablets (160 mg total) by mouth daily. Days 1-28. 60 tablet 3  . pantoprazole (PROTONIX) 40 MG tablet Take 1 tablet (40 mg total) by mouth daily. 30 tablet 1  . prochlorperazine (COMPAZINE) 10 MG tablet Take 1 tablet (10 mg total) by mouth every 6 (six) hours as needed for nausea or vomiting. 45 tablet 5  . spironolactone (ALDACTONE) 25 MG tablet Take 0.5 tablets (12.5 mg total) by mouth daily. 45 tablet 3  . aspirin 81 MG EC tablet 1 tablet (Patient not taking: Reported on 04/26/2020)     No current facility-administered medications for this visit.    SURGICAL HISTORY:  Past Surgical History:  Procedure Laterality Date  . ABDOMINAL HYSTERECTOMY    . BLADDER NECK RECONSTRUCTION    . BRONCHIAL BIOPSY  01/12/2020   Procedure: BRONCHIAL BIOPSIES;  Surgeon: Garner Nash, DO;  Location: [facility];  Service: Pulmonary;;  . BRONCHIAL BRUSHINGS  01/12/2020   Procedure: BRONCHIAL BRUSHINGS;  Surgeon: Garner Nash, DO;  Location: [facility];  Service: Pulmonary;;  . BRONCHIAL NEEDLE ASPIRATION BIOPSY  01/12/2020   Procedure: BRONCHIAL NEEDLE ASPIRATION BIOPSIES;  Surgeon: Garner Nash, DO;  Location: [facility];  Service: Pulmonary;;  . BRONCHIAL WASHINGS  01/12/2020   Procedure: BRONCHIAL WASHINGS;  Surgeon: Garner Nash, DO;  Location: [facility];  Service: Pulmonary;;  . SHOULDER SURGERY    . TONSILLECTOMY     . VIDEO BRONCHOSCOPY WITH ENDOBRONCHIAL ULTRASOUND  01/12/2020   Procedure: VIDEO BRONCHOSCOPY WITH ENDOBRONCHIAL ULTRASOUND;  Surgeon: Garner Nash, DO;  Location: [facility];  Service: Pulmonary;;    REVIEW OF SYSTEMS:   Review of Systems  Constitutional: Positive for fatigue, generalized weakness, and decreased appetite.  Negative for chills and fever. Negative for weight loss.  HENT: Negative for mouth sores, nosebleeds, sore throat and trouble swallowing.   Eyes: Negative for eye problems and icterus.  Respiratory: Positive for shortness of breath. Negative for hemoptysis, cough, and wheezing.   Cardiovascular:  Negative for chest pain or leg swelling.  Gastrointestinal: Positive for diarrhea. Negative for nausea, abdominal pain, constipation, and vomiting.  Genitourinary: Negative for bladder incontinence, difficulty urinating, dysuria, frequency and hematuria.   Musculoskeletal: Positive for right hip pain and midline lumbar pain.  Negative for gait problem,  neck pain and neck stiffness.  Skin:  Negative for itching or rash. Neurological: Positive for dizziness. negative for extremity weakness, gait problem, headaches, light-headedness and seizures.  Hematological: Negative for adenopathy. Does not bruise/bleed easily.  Psychiatric/Behavioral: Negative for confusion, depression and sleep disturbance. The patient is not nervous/anxious.     PHYSICAL EXAMINATION:  Blood pressure 114/63, pulse 71, temperature 98.8 F (37.1 C), temperature source Tympanic, resp. rate 15, height _0  (1.676 m), weight 168 lb 4.8 oz (76.3 kg), SpO2 97 %.  ECOG PERFORMANCE STATUS: 2-3  Physical Exam  Constitutional: Oriented to person, place, and time and chornically ill appearing female and in no distress.  HENT:  Head: Normocephalic and atraumatic.  Mouth/Throat: Oropharynx is clear and moist. No oropharyngeal exudate.  Eyes: Conjunctivae are normal. Right eye exhibits no discharge. Left eye  exhibits no discharge. No scleral icterus.  Neck: Normal range of motion. Neck supple.  Cardiovascular: Normal rate, regular rhythm, normal heart sounds and intact distal pulses.  Pulmonary/Chest: Effort normal and breath sounds normal. No respiratory distress. No wheezes. No rales.  Abdominal: Soft. Bowel sounds are normal. Exhibits no distension and no mass.Some tenderness in umbilical area.  Musculoskeletal: Examined in the wheelchair.  Tenderness to palpation over the lateral right hip.  No bruising noted.  Lymphadenopathy:  No cervical adenopathy.  Neurological: Alert and oriented to person, place, and time. Exhibits muscle wasting.  The patient was examined in the wheelchair.   Skin: Skin is warm and dry.  Psychiatric: Mood, memory and judgment normal.  Vitals reviewed.  LABORATORY DATA: Lab Results  Component Value Date   WBC 5.9 04/26/2020   HGB 11.7 (L) 04/26/2020   HCT 36.7 04/26/2020   MCV 91.1 04/26/2020   PLT 144 (L) 04/26/2020      Chemistry      Component Value Date/Time   NA 139 04/26/2020 1002   K 3.7 04/26/2020 1002   CL 104 04/26/2020 1002   CO2 27 04/26/2020 1002   BUN 14 04/26/2020 1002   CREATININE 0.84 04/26/2020 1002      Component Value Date/Time   CALCIUM 9.5 04/26/2020 1002   ALKPHOS 145 (H) 04/26/2020 1002   AST 13 (L) 04/26/2020 1002   ALT 9 04/26/2020 1002   BILITOT 0.5 04/26/2020 1002       RADIOGRAPHIC STUDIES:  MR BRAIN W WO CONTRAST  Result Date: 04/22/2020 CLINICAL DATA:  Brain/CNS neoplasm, assess treatment response EXAM: MRI HEAD WITHOUT AND WITH CONTRAST TECHNIQUE: Multiplanar, multiecho pulse sequences of the brain and surrounding structures were obtained without and with intravenous contrast. CONTRAST:  63m MULTIHANCE GADOBENATE DIMEGLUMINE 529 MG/ML IV SOLN COMPARISON:  01/10/2020 MRI head and prior. FINDINGS: Brain: Redemonstration of bilateral cerebral enhancing lesions, demonstrating decreased size and perilesional edema  since prior exam. Reference left temporal lesion measures 0.7 cm (19:48, previously 1.4 cm). Prior enhancing superior left temporal lesion is not demonstrated on this exam. Additional sub 5 mm enhancing lesions involving the bilateral vertex are not well demonstrated. A few of the previously visualized sub 5 mm right parietooccipital lesions are not visualized on this exam. Multifocal leptomeningeal metastases have also decreased in size. Reference left occipital leptomeningeal enhancement previously measuring 2.8 cm in maximal transaxial dimension now measures 2.2 cm with thin peripheral enhancement. Associated left occipital SWI signal dropout is unchanged. Decreased conspicuity left temporo-occipital junction lesion (19:77). No new enhancing lesions. No midline shift, ventriculomegaly or extra-axial fluid collection. No acute infarct. No new intracranial hemorrhage. Vascular: Normal flow voids.  Skull and upper cervical spine: Right parietal calvarial lesion measuring 1.3 cm demonstrates decreased size and enhancement (19:123). No new enhancing osseous lesion. Sinuses/Orbits: Sequela of bilateral lens placement. Pneumatized paranasal sinuses. Trace bilateral mastoid free fluid. Other: None. IMPRESSION: Decreased size and number of multifocal parenchymal and leptomeningeal metastases. No new metastatic lesions. Decreased size and enhancement of right parietal calvarial metastasis. Electronically Signed   By: Primitivo Gauze M.D.   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  The patientcompletedwhole brain radiation as well a palliative radiotherapy to the osseous metastasesand primary lung lesionunder the care of Dr. Lisbeth Renshaw. Her last treatmentwason 02/02/2020.  The patient is currently undergoingoral chemotherapy with Tagrisso 160 mg p.o. daily. Her first dosewas on 02/01/20.  The patient was seen with Dr. Julien Nordmann today. Labs were reviewed. Recommend that she proceed with the current treatment at the same dose.   She will also continue on her treatment with Eliquis for her pulmonary embolism.  We will arrange for the patient to have a hip and lumbar spine x-ray performed today to rule out fracture.   We will see the patient back for follow-up visit in 4 weeks for evaluation and repeat lab work.  She will follow up with neuro-oncology today as scheduled for her recent brain MRI.  The patient was advised to call immediately if she has any concerning symptoms in the interval. The patient voices understanding of current disease status and treatment options and is in agreement with the current care plan. All questions were answered. The patient knows to call the clinic with any problems, questions or concerns. We can certainly see the patient much sooner if necessary    Orders Placed This Encounter  Procedures  . DG Hip Unilat W or W/O Pelvis 1 View Right    Standing Status:   Future    Standing Expiration Date:   04/26/2021    Order Specific Question:   Reason for Exam (SYMPTOM  OR DIAGNOSIS REQUIRED)    Answer:   Metastatic lung cancer with multiple pelvic mets. She fell a few days ago and has right hip pain    Order Specific Question:   Preferred imaging location?    Answer:   [facility]  . DG Lumbar Spine 2-3 Views    Standing Status:   Future    Standing Expiration Date:   04/26/2021    Order Specific Question:   Reason for Exam (SYMPTOM  OR DIAGNOSIS REQUIRED)    Answer:   Metastatic Lung cancer with pelvic mets. [place]  recently with low back and right hip pain    Order Specific Question:   Preferred imaging location?    Answer:   [facility] ([facility] only)    Standing Status:   Future    Standing Expiration Date:   04/26/2021  . CBC with Differential (Cancer Center Only)    Standing Status:   Future    Standing Expiration Date:   04/26/2021     Tobe Sos Joeann Steppe, PA-C 04/26/20  ADDENDUM: Hematology/Oncology Attending: I had a face-to-face encounter with  the patient today.  I recommended her care plan.  This is a very pleasant 74 years old white female diagnosed with stage IV non-small cell lung cancer, adenocarcinoma with positive EGFR mutation, exon 19 deletion.  She presented with left lower lobe lung mass in addition to extensive bilateral pulmonary metastasis and anterior mediastinal lymphadenopathy as well as a skeletal metastasis and metastatic disease to the brain with leptomeningeal disease.  The patient is currently undergoing treatment with Tagrisso 160 mg p.o. daily and has been tolerating this treatment well except for fatigue.  She has recent falls and hurt her right hip. She is currently undergoing physical therapy and also has been complaining of intermittent vertigo secondary to inner ear problems that has been going on for several years. I recommended for the patient to continue her current treatment with Tagrisso with the same dose. She had MRI of the brain that showed improvement of her disease. We will order x-ray of the left hip to rule out any fracture. The patient will come back for follow-up visit in 1 months for evaluation and repeat blood work. She was advised to call immediately if she has any other concerning symptoms in the interval.  Disclaimer: This note was dictated with voice recognition software. Similar sounding words can inadvertently be transcribed and may be missed upon review. Eilleen Kempf, MD 04/26/20

## 2020-04-20 DIAGNOSIS — C7951 Secondary malignant neoplasm of bone: Secondary | ICD-10-CM | POA: Diagnosis not present

## 2020-04-20 DIAGNOSIS — C7932 Secondary malignant neoplasm of cerebral meninges: Secondary | ICD-10-CM | POA: Diagnosis not present

## 2020-04-20 DIAGNOSIS — C3432 Malignant neoplasm of lower lobe, left bronchus or lung: Secondary | ICD-10-CM | POA: Diagnosis not present

## 2020-04-20 DIAGNOSIS — N1831 Chronic kidney disease, stage 3a: Secondary | ICD-10-CM | POA: Diagnosis not present

## 2020-04-20 DIAGNOSIS — I428 Other cardiomyopathies: Secondary | ICD-10-CM | POA: Diagnosis not present

## 2020-04-20 DIAGNOSIS — G4700 Insomnia, unspecified: Secondary | ICD-10-CM | POA: Diagnosis not present

## 2020-04-20 DIAGNOSIS — E039 Hypothyroidism, unspecified: Secondary | ICD-10-CM | POA: Diagnosis not present

## 2020-04-20 DIAGNOSIS — I129 Hypertensive chronic kidney disease with stage 1 through stage 4 chronic kidney disease, or unspecified chronic kidney disease: Secondary | ICD-10-CM | POA: Diagnosis not present

## 2020-04-20 DIAGNOSIS — C771 Secondary and unspecified malignant neoplasm of intrathoracic lymph nodes: Secondary | ICD-10-CM | POA: Diagnosis not present

## 2020-04-20 DIAGNOSIS — I2699 Other pulmonary embolism without acute cor pulmonale: Secondary | ICD-10-CM | POA: Diagnosis not present

## 2020-04-22 ENCOUNTER — Ambulatory Visit
Admission: RE | Admit: 2020-04-22 | Discharge: 2020-04-22 | Disposition: A | Discharge status: Home / Self Care | Payer: Medicare HMO | Source: Ambulatory Visit | Attending: Internal Medicine | Admitting: Internal Medicine

## 2020-04-22 DIAGNOSIS — C3492 Malignant neoplasm of unspecified part of left bronchus or lung: Secondary | ICD-10-CM | POA: Diagnosis not present

## 2020-04-22 DIAGNOSIS — C7949 Secondary malignant neoplasm of other parts of nervous system: Secondary | ICD-10-CM

## 2020-04-22 DIAGNOSIS — C7931 Secondary malignant neoplasm of brain: Secondary | ICD-10-CM

## 2020-04-22 DIAGNOSIS — C801 Malignant (primary) neoplasm, unspecified: Secondary | ICD-10-CM | POA: Diagnosis not present

## 2020-04-22 MED ORDER — GADOBENATE DIMEGLUMINE 529 MG/ML IV SOLN
15.0000 mL | Freq: Once | INTRAVENOUS | Status: AC | PRN
  Administered 2020-04-22: 15 mL via INTRAVENOUS

## 2020-04-25 DIAGNOSIS — I2699 Other pulmonary embolism without acute cor pulmonale: Secondary | ICD-10-CM | POA: Diagnosis not present

## 2020-04-25 DIAGNOSIS — G4700 Insomnia, unspecified: Secondary | ICD-10-CM | POA: Diagnosis not present

## 2020-04-25 DIAGNOSIS — C7951 Secondary malignant neoplasm of bone: Secondary | ICD-10-CM | POA: Diagnosis not present

## 2020-04-25 DIAGNOSIS — I428 Other cardiomyopathies: Secondary | ICD-10-CM | POA: Diagnosis not present

## 2020-04-25 DIAGNOSIS — C771 Secondary and unspecified malignant neoplasm of intrathoracic lymph nodes: Secondary | ICD-10-CM | POA: Diagnosis not present

## 2020-04-25 DIAGNOSIS — I129 Hypertensive chronic kidney disease with stage 1 through stage 4 chronic kidney disease, or unspecified chronic kidney disease: Secondary | ICD-10-CM | POA: Diagnosis not present

## 2020-04-25 DIAGNOSIS — C7932 Secondary malignant neoplasm of cerebral meninges: Secondary | ICD-10-CM | POA: Diagnosis not present

## 2020-04-25 DIAGNOSIS — C3432 Malignant neoplasm of lower lobe, left bronchus or lung: Secondary | ICD-10-CM | POA: Diagnosis not present

## 2020-04-25 DIAGNOSIS — N1831 Chronic kidney disease, stage 3a: Secondary | ICD-10-CM | POA: Diagnosis not present

## 2020-04-25 DIAGNOSIS — E039 Hypothyroidism, unspecified: Secondary | ICD-10-CM | POA: Diagnosis not present

## 2020-04-26 ENCOUNTER — Encounter: Payer: Self-pay | Admitting: Physician Assistant

## 2020-04-26 ENCOUNTER — Ambulatory Visit (HOSPITAL_COMMUNITY)
Admission: RE | Admit: 2020-04-26 | Discharge: 2020-04-26 | Disposition: A | Discharge status: Home / Self Care | Payer: Medicare HMO | Source: Ambulatory Visit | Attending: Physician Assistant | Admitting: Physician Assistant

## 2020-04-26 ENCOUNTER — Other Ambulatory Visit: Payer: Self-pay

## 2020-04-26 ENCOUNTER — Inpatient Hospital Stay: Payer: Medicare HMO

## 2020-04-26 ENCOUNTER — Inpatient Hospital Stay: Payer: Medicare HMO | Admitting: Internal Medicine

## 2020-04-26 ENCOUNTER — Inpatient Hospital Stay: Payer: Medicare HMO | Admitting: Physician Assistant

## 2020-04-26 VITALS — BP 114/63 | HR 71 | Temp 98.8°F | Resp 15 | Ht 66.0 in | Wt 168.4 lb

## 2020-04-26 DIAGNOSIS — C3492 Malignant neoplasm of unspecified part of left bronchus or lung: Secondary | ICD-10-CM

## 2020-04-26 DIAGNOSIS — M25551 Pain in right hip: Secondary | ICD-10-CM

## 2020-04-26 DIAGNOSIS — C3432 Malignant neoplasm of lower lobe, left bronchus or lung: Secondary | ICD-10-CM | POA: Diagnosis not present

## 2020-04-26 DIAGNOSIS — C7931 Secondary malignant neoplasm of brain: Secondary | ICD-10-CM

## 2020-04-26 DIAGNOSIS — M545 Low back pain, unspecified: Secondary | ICD-10-CM | POA: Diagnosis not present

## 2020-04-26 DIAGNOSIS — Z85118 Personal history of other malignant neoplasm of bronchus and lung: Secondary | ICD-10-CM | POA: Diagnosis not present

## 2020-04-26 DIAGNOSIS — C7949 Secondary malignant neoplasm of other parts of nervous system: Secondary | ICD-10-CM

## 2020-04-26 DIAGNOSIS — C7951 Secondary malignant neoplasm of bone: Secondary | ICD-10-CM | POA: Diagnosis not present

## 2020-04-26 LAB — CBC WITH DIFFERENTIAL (CANCER CENTER ONLY)
Abs Immature Granulocytes: 0.01 10*3/uL (ref 0.00–0.07)
Basophils Absolute: 0 10*3/uL (ref 0.0–0.1)
Basophils Relative: 0 %
Eosinophils Absolute: 0 10*3/uL (ref 0.0–0.5)
Eosinophils Relative: 0 %
HCT: 36.7 % (ref 36.0–46.0)
Hemoglobin: 11.7 g/dL — ABNORMAL LOW (ref 12.0–15.0)
Immature Granulocytes: 0 %
Lymphocytes Relative: 17 %
Lymphs Abs: 1 10*3/uL (ref 0.7–4.0)
MCH: 29 pg (ref 26.0–34.0)
MCHC: 31.9 g/dL (ref 30.0–36.0)
MCV: 91.1 fL (ref 80.0–100.0)
Monocytes Absolute: 0.6 10*3/uL (ref 0.1–1.0)
Monocytes Relative: 9 %
Neutro Abs: 4.3 10*3/uL (ref 1.7–7.7)
Neutrophils Relative %: 74 %
Platelet Count: 144 10*3/uL — ABNORMAL LOW (ref 150–400)
RBC: 4.03 MIL/uL (ref 3.87–5.11)
RDW: 16.6 % — ABNORMAL HIGH (ref 11.5–15.5)
WBC Count: 5.9 10*3/uL (ref 4.0–10.5)
nRBC: 0 % (ref 0.0–0.2)

## 2020-04-26 LAB — CMP (CANCER CENTER ONLY)
ALT: 9 U/L (ref 0–44)
AST: 13 U/L — ABNORMAL LOW (ref 15–41)
Albumin: 2.8 g/dL — ABNORMAL LOW (ref 3.5–5.0)
Alkaline Phosphatase: 145 U/L — ABNORMAL HIGH (ref 38–126)
Anion gap: 8 (ref 5–15)
BUN: 14 mg/dL (ref 8–23)
CO2: 27 mmol/L (ref 22–32)
Calcium: 9.5 mg/dL (ref 8.9–10.3)
Chloride: 104 mmol/L (ref 98–111)
Creatinine: 0.84 mg/dL (ref 0.44–1.00)
GFR, Estimated: >60 mL/min (ref >60)
Glucose, Bld: 98 mg/dL (ref 70–99)
Potassium: 3.7 mmol/L (ref 3.5–5.1)
Sodium: 139 mmol/L (ref 135–145)
Total Bilirubin: 0.5 mg/dL (ref 0.3–1.2)
Total Protein: 6.6 g/dL (ref 6.5–8.1)

## 2020-04-26 NOTE — Progress Notes (Signed)
[facility] [place], [place] [address] [phone]   Interval Evaluation  Date of Service: 04/26/20 Patient Name: Stefanie Camacho Patient MRN: [id number] Patient DOB: 1946-04-09 Provider: Ventura Sellers, MD  Identifying Statement:  Stefanie Camacho is a 74 y.o. 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     CNS Oncologic History 02/02/20: Completes WBRT with Dr. Lisbeth Renshaw  Interval History:  Stefanie Camacho presents today for follow up after recent MRI brain.  She does not describe recurrence of episodes of slurred speech.  She continues to experience fatigue and generalized weakness as prior but no weakness affecting individual limbs.  Does complain of occassional bouts of vertigo which are positional in nature; these go back years prior to cancer diagnosis.  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 Still on TOliverwith Dr. MJulien Nordmann    H+P (01/29/20) Patient presented to medical attention on 01/09/20 with sudden onset "marching" left sided facial numbness.  The episode drew suspicion for stroke, and during workup patient was found to have innumerable brain metastases with additional foci of leptomeningeal enhancement secondary to widely metastatic lung cancer.  She returned to baseline and was [place] with [place] disease has since been treated with whole brain radiotherapy with Dr. MLisbeth Renshaw she is scheduled to complete treatment next week.  She has difficulty with ambulation because of a hip metastasis, but since being treated with RT that pain has improved as has her walking.  Plans to meet with Dr. MJulien Nordmannnext  week to discuss plans for therapeutic targeting of EGFR mutation.    Medications: Current Outpatient Medications on File Prior to Visit  Medication Sig Dispense Refill  . acetaminophen (TYLENOL) 500 MG tablet Take 1,000 mg by mouth every 6 (six) hours as needed for mild pain.    .Marland Kitchenapixaban (ELIQUIS) 5 MG TABS tablet Take 1 tablet (5 mg total) by mouth 2 (two) times daily. 60 tablet 1  . atorvastatin (LIPITOR) 10 MG tablet Take 1 tablet (10 mg total) by mouth daily at 6 PM. 90 tablet 3  . FLUoxetine (PROZAC) 20 MG capsule Take 20 mg by mouth daily.    .Marland KitchenlevETIRAcetam (KEPPRA) 500 MG tablet Take 1 tablet (500 mg total) by mouth 2 (two) times daily. 60 tablet 1  . levothyroxine (SYNTHROID) 75 MCG tablet Take 1 tablet (75 mcg total) by mouth daily. 30 tablet 1  . loperamide (IMODIUM) 2 MG capsule Take 2 mg by mouth as needed for diarrhea or loose stools.    .Marland Kitchenlosartan (COZAAR) 50 MG tablet Take 1 tablet (50 mg total) by mouth daily. 90 tablet 2  . metoprolol succinate (TOPROL-XL) 50 MG 24 hr tablet Take 1 tablet (50 mg total) by mouth daily. Take with or immediately following a meal. 90 tablet 2  . osimertinib mesylate (TAGRISSO) 80 MG tablet Take 2 tablets (160 mg total) by mouth daily. Days 1-28. 60 tablet 3  . pantoprazole (PROTONIX) 40 MG tablet Take 1 tablet (40 mg total) by mouth daily. 30 tablet 1  . prochlorperazine (COMPAZINE) 10 MG tablet Take 1 tablet (10 mg total) by mouth every 6 (six) hours as needed  for nausea or vomiting. 45 tablet 5  . spironolactone (ALDACTONE) 25 MG tablet Take 0.5 tablets (12.5 mg total) by mouth daily. 45 tablet 3   No current facility-administered medications on file prior to visit.    Allergies: No Known Allergies Past Medical History:  Past Medical History:  Diagnosis Date  . Anxiety   . Dyslipidemia   . Essential hypertension   . GERD (gastroesophageal reflux disease)   . Hypothyroidism   . Left bundle branch block 06/04/2018  . NICM (nonischemic  cardiomyopathy) ([place])   . Nonischemic cardiomyopathy ([place]) 06/04/2018   Ejection fraction 45% in summer 2019  . Sleep apnea    Past Surgical History:  Past Surgical History:  Procedure Laterality Date  . ABDOMINAL HYSTERECTOMY    . BLADDER NECK RECONSTRUCTION    . BRONCHIAL BIOPSY  01/12/2020   Procedure: BRONCHIAL BIOPSIES;  Surgeon: Garner Nash, DO;  Location: [facility];  Service: Pulmonary;;  . BRONCHIAL BRUSHINGS  01/12/2020   Procedure: BRONCHIAL BRUSHINGS;  Surgeon: Garner Nash, DO;  Location: [facility];  Service: Pulmonary;;  . BRONCHIAL NEEDLE ASPIRATION BIOPSY  01/12/2020   Procedure: BRONCHIAL NEEDLE ASPIRATION BIOPSIES;  Surgeon: Garner Nash, DO;  Location: [facility];  Service: Pulmonary;;  . BRONCHIAL WASHINGS  01/12/2020   Procedure: BRONCHIAL WASHINGS;  Surgeon: Garner Nash, DO;  Location: [facility];  Service: Pulmonary;;  . SHOULDER SURGERY    . TONSILLECTOMY    . VIDEO BRONCHOSCOPY WITH ENDOBRONCHIAL ULTRASOUND  01/12/2020   Procedure: VIDEO BRONCHOSCOPY WITH ENDOBRONCHIAL ULTRASOUND;  Surgeon: Garner Nash, DO;  Location: [facility];  Service: Pulmonary;;   Social History:  Social History   Socioeconomic History  . Marital status: Divorced    Spouse name: Not on file  . Number of children: Not on file  . Years of education: Not on file  . Highest education level: Not on file  Occupational History  . Not on file  Tobacco Use  . Smoking status: Never Smoker  . Smokeless tobacco: Never Used  Vaping Use  . Vaping Use: Never used  Substance and Sexual Activity  . Alcohol use: Not Currently  . Drug use: Never  . Sexual activity: Not on file  Other Topics Concern  . Not on file  Social History Narrative  . Not on file   Social Determinants of Health   Financial Resource Strain: Not on file  Food Insecurity: Not on file  Transportation Needs: Not on file  Physical Activity: Not on file  Stress: Not on file  Social Connections:  Not on file  Intimate Partner Violence: Not on file   Family History:  Family History  Problem Relation Age of Onset  . Heart disease Mother   . Stroke Mother   . Heart disease Father   . Stroke Father   . Heart disease Maternal Grandfather   . Heart disease Paternal Grandfather     Review of Systems: Constitutional: Doesn't report fevers, chills or abnormal weight loss Eyes: Doesn't report blurriness of vision Ears, nose, mouth, throat, and face: Doesn't report sore throat Respiratory: Doesn't report cough, dyspnea or wheezes Cardiovascular: Doesn't report palpitation, chest discomfort  Gastrointestinal:  Doesn't report nausea, constipation, diarrhea GU: Doesn't report incontinence Skin: Doesn't report skin rashes Neurological: Per HPI Musculoskeletal: Doesn't report joint pain Behavioral/Psych: Doesn't report anxiety  Physical Exam: Vitals with BMI 04/26/2020 04/26/2020 04/26/2020  Height 5' 6" 5' 6" 5' 6"  Weight 168 lbs 6 oz 168 lbs 6 oz  168 lbs 5 oz  BMI 27.19 27.19 27.18  Systolic 114 114 114  Diastolic 63 63 63  Pulse 71 71 71   KPS: 80. General: Alert, cooperative, pleasant, in no acute distress Head: Normal EENT: No conjunctival injection or scleral icterus.  Lungs: Resp effort normal Cardiac: Regular rate Abdomen: Non-distended abdomen Skin: No rashes cyanosis or petechiae. Extremities: No clubbing or edema  Neurologic Exam: Mental Status: Awake, alert, attentive to examiner. Oriented to self and environment. Language is fluent with intact comprehension.  Cranial Nerves: Visual acuity is grossly normal. Visual fields are full. Extra-ocular movements intact. No ptosis. Face is symmetric Motor: Tone and bulk are normal. Power is full in both arms and legs. Reflexes are symmetric, no pathologic reflexes present.  Sensory: Intact to light touch Gait: Orthopedic limitation  Labs: I have reviewed the data as listed    Component Value Date/Time   NA 139  04/26/2020 1002   K 3.7 04/26/2020 1002   CL 104 04/26/2020 1002   CO2 27 04/26/2020 1002   GLUCOSE 98 04/26/2020 1002   BUN 14 04/26/2020 1002   CREATININE 0.84 04/26/2020 1002   CALCIUM 9.5 04/26/2020 1002   PROT 6.6 04/26/2020 1002   PROT 6.9 07/17/2018 0934   ALBUMIN 2.8 (L) 04/26/2020 1002   ALBUMIN 4.1 07/17/2018 0934   AST 13 (L) 04/26/2020 1002   ALT 9 04/26/2020 1002   ALKPHOS 145 (H) 04/26/2020 1002   BILITOT 0.5 04/26/2020 1002   GFRNONAA >60 04/26/2020 1002   GFRAA >60 02/01/2020 1419   Lab Results  Component Value Date   WBC 5.9 04/26/2020   NEUTROABS 4.3 04/26/2020   HGB 11.7 (L) 04/26/2020   HCT 36.7 04/26/2020   MCV 91.1 04/26/2020   PLT 144 (L) 04/26/2020   Imaging:  [facility] Clinician Interpretation: I have personally reviewed the CNS images as listed.  My interpretation, in the context of the patient's clinical presentation, is stable disease  DG Lumbar Spine 2-3 Views  Result Date: 04/26/2020 CLINICAL DATA:  Metastatic lung cancer with pelvic metastasis. Fall with low back and right hip pain. EXAM: LUMBAR SPINE - 2-3 VIEW COMPARISON:  12/07/2019 MRI. Outside plain film 11/16/2019, without report. FINDINGS: Five lumbar type vertebral bodies. Sacroiliac joints are symmetric. Mild superior endplate compression deformity at L1, new since 12/07/2019. No gross ventral canal encroachment. Lumbosacral spondylosis, with degenerate disc disease at L3-4 through L5-S1. Facet arthropathy at L4-5 and L5-S1. IMPRESSION: Mild L1 superior endplate compression deformity, new since 12/07/2019. Electronically Signed   By: Kyle  Talbot M.D.   On: 04/26/2020 11:59   MR BRAIN W WO CONTRAST  Result Date: 04/22/2020 CLINICAL DATA:  Brain/CNS neoplasm, assess treatment response EXAM: MRI HEAD WITHOUT AND WITH CONTRAST TECHNIQUE: Multiplanar, multiecho pulse sequences of the brain and surrounding structures were obtained without and with intravenous contrast. CONTRAST:  15mL MULTIHANCE  GADOBENATE DIMEGLUMINE 529 MG/ML IV SOLN COMPARISON:  01/10/2020 MRI head and prior. FINDINGS: Brain: Redemonstration of bilateral cerebral enhancing lesions, demonstrating decreased size and perilesional edema since prior exam. Reference left temporal lesion measures 0.7 cm (19:48, previously 1.4 cm). Prior enhancing superior left temporal lesion is not demonstrated on this exam. Additional sub 5 mm enhancing lesions involving the bilateral vertex are not well demonstrated. A few of the previously visualized sub 5 mm right parietooccipital lesions are not visualized on this exam. Multifocal leptomeningeal metastases have also decreased in size. Reference left occipital leptomeningeal enhancement previously measuring 2.8 cm in maximal transaxial dimension now measures   2.2 cm with thin peripheral enhancement. Associated left occipital SWI signal dropout is unchanged. Decreased conspicuity left temporo-occipital junction lesion (19:77). No new enhancing lesions. No midline shift, ventriculomegaly or extra-axial fluid collection. No acute infarct. No new intracranial hemorrhage. Vascular: Normal flow voids. Skull and upper cervical spine: Right parietal calvarial lesion measuring 1.3 cm demonstrates decreased size and enhancement (19:123). No new enhancing osseous lesion. Sinuses/Orbits: Sequela of bilateral lens placement. Pneumatized paranasal sinuses. Trace bilateral mastoid free fluid. Other: None. IMPRESSION: Decreased size and number of multifocal parenchymal and leptomeningeal metastases. No new metastatic lesions. Decreased size and enhancement of right parietal calvarial metastasis. Electronically Signed   By: Chikanele  Emekauwa M.D.   On: 04/22/2020 12:56   DG Hip Unilat W or W/O Pelvis 1 View Right  Result Date: 04/26/2020 CLINICAL DATA:  Right hip pain in a patient with a history of metastatic lung cancer. No known injury. EXAM: DG HIP (WITH OR WITHOUT PELVIS) 1V RIGHT COMPARISON:  Plain films right  hip 11/13/2019. CT chest, abdomen and pelvis 03/25/2020. FINDINGS: No fracture or dislocation. Scattered, subtle sclerotic lesions are seen in both hips and in the pelvis. Bones have a somewhat mottled appearance. IMPRESSION: No acute abnormality. No change in the appearance of metastatic disease. Electronically Signed   By: Thomas  Dalessio M.D.   On: 04/26/2020 11:59    Assessment/Plan Brain Metastases Leptomeningeal Metastases  Kolbi Batterman is clinically stable today.  MRI demonstrates good control of parenchymal and leptomeningeal disease burden.  Vertigo symptoms are unlikely driven by CNS process or LMD.  Will con't Keppra 250mg BID.  We appreciate the opportunity to participate in the care of Stefanie Camacho.    We ask that Stephanie Willets return to clinic in 3 months following next brain MRI, or sooner as needed.  All questions were answered. The patient knows to call the clinic with any problems, questions or concerns. No barriers to learning were detected.  The total time spent in the encounter was 30 minutes and more than 50% was on counseling and review of test results   Zachary K Vaslow, MD Medical Director of Neuro-Oncology [facility] 04/26/20 12:47 PM 

## 2020-04-27 ENCOUNTER — Telehealth: Payer: Self-pay | Admitting: Internal Medicine

## 2020-04-27 NOTE — Telephone Encounter (Signed)
Scheduled follow-up appointment. Patient is aware.

## 2020-04-28 DIAGNOSIS — N1831 Chronic kidney disease, stage 3a: Secondary | ICD-10-CM | POA: Diagnosis not present

## 2020-04-28 DIAGNOSIS — C771 Secondary and unspecified malignant neoplasm of intrathoracic lymph nodes: Secondary | ICD-10-CM | POA: Diagnosis not present

## 2020-04-28 DIAGNOSIS — C7932 Secondary malignant neoplasm of cerebral meninges: Secondary | ICD-10-CM | POA: Diagnosis not present

## 2020-04-28 DIAGNOSIS — E039 Hypothyroidism, unspecified: Secondary | ICD-10-CM | POA: Diagnosis not present

## 2020-04-28 DIAGNOSIS — I129 Hypertensive chronic kidney disease with stage 1 through stage 4 chronic kidney disease, or unspecified chronic kidney disease: Secondary | ICD-10-CM | POA: Diagnosis not present

## 2020-04-28 DIAGNOSIS — G4700 Insomnia, unspecified: Secondary | ICD-10-CM | POA: Diagnosis not present

## 2020-04-28 DIAGNOSIS — I428 Other cardiomyopathies: Secondary | ICD-10-CM | POA: Diagnosis not present

## 2020-04-28 DIAGNOSIS — C3432 Malignant neoplasm of lower lobe, left bronchus or lung: Secondary | ICD-10-CM | POA: Diagnosis not present

## 2020-04-28 DIAGNOSIS — C7951 Secondary malignant neoplasm of bone: Secondary | ICD-10-CM | POA: Diagnosis not present

## 2020-04-28 DIAGNOSIS — I2699 Other pulmonary embolism without acute cor pulmonale: Secondary | ICD-10-CM | POA: Diagnosis not present

## 2020-05-02 DIAGNOSIS — I2699 Other pulmonary embolism without acute cor pulmonale: Secondary | ICD-10-CM | POA: Diagnosis not present

## 2020-05-02 DIAGNOSIS — C7951 Secondary malignant neoplasm of bone: Secondary | ICD-10-CM | POA: Diagnosis not present

## 2020-05-02 DIAGNOSIS — C3432 Malignant neoplasm of lower lobe, left bronchus or lung: Secondary | ICD-10-CM | POA: Diagnosis not present

## 2020-05-02 DIAGNOSIS — I129 Hypertensive chronic kidney disease with stage 1 through stage 4 chronic kidney disease, or unspecified chronic kidney disease: Secondary | ICD-10-CM | POA: Diagnosis not present

## 2020-05-02 DIAGNOSIS — G4700 Insomnia, unspecified: Secondary | ICD-10-CM | POA: Diagnosis not present

## 2020-05-02 DIAGNOSIS — E039 Hypothyroidism, unspecified: Secondary | ICD-10-CM | POA: Diagnosis not present

## 2020-05-02 DIAGNOSIS — N1831 Chronic kidney disease, stage 3a: Secondary | ICD-10-CM | POA: Diagnosis not present

## 2020-05-02 DIAGNOSIS — I428 Other cardiomyopathies: Secondary | ICD-10-CM | POA: Diagnosis not present

## 2020-05-02 DIAGNOSIS — C771 Secondary and unspecified malignant neoplasm of intrathoracic lymph nodes: Secondary | ICD-10-CM | POA: Diagnosis not present

## 2020-05-02 DIAGNOSIS — C7932 Secondary malignant neoplasm of cerebral meninges: Secondary | ICD-10-CM | POA: Diagnosis not present

## 2020-05-05 DIAGNOSIS — C7932 Secondary malignant neoplasm of cerebral meninges: Secondary | ICD-10-CM | POA: Diagnosis not present

## 2020-05-05 DIAGNOSIS — I2699 Other pulmonary embolism without acute cor pulmonale: Secondary | ICD-10-CM | POA: Diagnosis not present

## 2020-05-05 DIAGNOSIS — I129 Hypertensive chronic kidney disease with stage 1 through stage 4 chronic kidney disease, or unspecified chronic kidney disease: Secondary | ICD-10-CM | POA: Diagnosis not present

## 2020-05-05 DIAGNOSIS — C3432 Malignant neoplasm of lower lobe, left bronchus or lung: Secondary | ICD-10-CM | POA: Diagnosis not present

## 2020-05-05 DIAGNOSIS — E039 Hypothyroidism, unspecified: Secondary | ICD-10-CM | POA: Diagnosis not present

## 2020-05-05 DIAGNOSIS — C7951 Secondary malignant neoplasm of bone: Secondary | ICD-10-CM | POA: Diagnosis not present

## 2020-05-05 DIAGNOSIS — I428 Other cardiomyopathies: Secondary | ICD-10-CM | POA: Diagnosis not present

## 2020-05-05 DIAGNOSIS — G4700 Insomnia, unspecified: Secondary | ICD-10-CM | POA: Diagnosis not present

## 2020-05-05 DIAGNOSIS — C771 Secondary and unspecified malignant neoplasm of intrathoracic lymph nodes: Secondary | ICD-10-CM | POA: Diagnosis not present

## 2020-05-05 DIAGNOSIS — N1831 Chronic kidney disease, stage 3a: Secondary | ICD-10-CM | POA: Diagnosis not present

## 2020-05-08 ENCOUNTER — Other Ambulatory Visit: Payer: Self-pay | Admitting: Physician Assistant

## 2020-05-08 DIAGNOSIS — K219 Gastro-esophageal reflux disease without esophagitis: Secondary | ICD-10-CM

## 2020-05-10 ENCOUNTER — Other Ambulatory Visit: Payer: Self-pay | Admitting: Physician Assistant

## 2020-05-10 DIAGNOSIS — K219 Gastro-esophageal reflux disease without esophagitis: Secondary | ICD-10-CM

## 2020-05-15 DIAGNOSIS — N390 Urinary tract infection, site not specified: Secondary | ICD-10-CM | POA: Diagnosis not present

## 2020-05-16 ENCOUNTER — Other Ambulatory Visit: Payer: Self-pay

## 2020-05-16 DIAGNOSIS — N1831 Chronic kidney disease, stage 3a: Secondary | ICD-10-CM

## 2020-05-16 DIAGNOSIS — F325 Major depressive disorder, single episode, in full remission: Secondary | ICD-10-CM

## 2020-05-16 DIAGNOSIS — L719 Rosacea, unspecified: Secondary | ICD-10-CM

## 2020-05-16 DIAGNOSIS — I129 Hypertensive chronic kidney disease with stage 1 through stage 4 chronic kidney disease, or unspecified chronic kidney disease: Secondary | ICD-10-CM

## 2020-05-16 DIAGNOSIS — N393 Stress incontinence (female) (male): Secondary | ICD-10-CM

## 2020-05-16 DIAGNOSIS — F329 Major depressive disorder, single episode, unspecified: Secondary | ICD-10-CM

## 2020-05-16 DIAGNOSIS — C771 Secondary and unspecified malignant neoplasm of intrathoracic lymph nodes: Secondary | ICD-10-CM

## 2020-05-16 DIAGNOSIS — R400 Somnolence: Secondary | ICD-10-CM | POA: Insufficient documentation

## 2020-05-16 DIAGNOSIS — J9610 Chronic respiratory failure, unspecified whether with hypoxia or hypercapnia: Secondary | ICD-10-CM

## 2020-05-16 DIAGNOSIS — Z7901 Long term (current) use of anticoagulants: Secondary | ICD-10-CM

## 2020-05-16 DIAGNOSIS — R918 Other nonspecific abnormal finding of lung field: Secondary | ICD-10-CM | POA: Insufficient documentation

## 2020-05-16 DIAGNOSIS — E785 Hyperlipidemia, unspecified: Secondary | ICD-10-CM | POA: Insufficient documentation

## 2020-05-16 DIAGNOSIS — G4700 Insomnia, unspecified: Secondary | ICD-10-CM

## 2020-05-16 HISTORY — DX: Other nonspecific abnormal finding of lung field: R91.8

## 2020-05-16 HISTORY — DX: Chronic kidney disease, stage 3a: N18.31

## 2020-05-16 HISTORY — DX: Stress incontinence (female) (male): N39.3

## 2020-05-16 HISTORY — DX: Chronic respiratory failure, unspecified whether with hypoxia or hypercapnia: J96.10

## 2020-05-16 HISTORY — DX: Hypertensive chronic kidney disease with stage 1 through stage 4 chronic kidney disease, or unspecified chronic kidney disease: I12.9

## 2020-05-16 HISTORY — DX: Rosacea, unspecified: L71.9

## 2020-05-16 HISTORY — DX: Long term (current) use of anticoagulants: Z79.01

## 2020-05-16 HISTORY — DX: Major depressive disorder, single episode, in full remission: F32.5

## 2020-05-16 HISTORY — DX: Somnolence: R40.0

## 2020-05-16 HISTORY — DX: Major depressive disorder, single episode, unspecified: F32.9

## 2020-05-16 HISTORY — DX: Insomnia, unspecified: G47.00

## 2020-05-16 HISTORY — DX: Hyperlipidemia, unspecified: E78.5

## 2020-05-16 HISTORY — DX: Secondary and unspecified malignant neoplasm of intrathoracic lymph nodes: C77.1

## 2020-05-17 DIAGNOSIS — G4700 Insomnia, unspecified: Secondary | ICD-10-CM | POA: Diagnosis not present

## 2020-05-17 DIAGNOSIS — C7951 Secondary malignant neoplasm of bone: Secondary | ICD-10-CM | POA: Diagnosis not present

## 2020-05-17 DIAGNOSIS — N1831 Chronic kidney disease, stage 3a: Secondary | ICD-10-CM | POA: Diagnosis not present

## 2020-05-17 DIAGNOSIS — I129 Hypertensive chronic kidney disease with stage 1 through stage 4 chronic kidney disease, or unspecified chronic kidney disease: Secondary | ICD-10-CM | POA: Diagnosis not present

## 2020-05-17 DIAGNOSIS — C771 Secondary and unspecified malignant neoplasm of intrathoracic lymph nodes: Secondary | ICD-10-CM | POA: Diagnosis not present

## 2020-05-17 DIAGNOSIS — C7932 Secondary malignant neoplasm of cerebral meninges: Secondary | ICD-10-CM | POA: Diagnosis not present

## 2020-05-17 DIAGNOSIS — I428 Other cardiomyopathies: Secondary | ICD-10-CM | POA: Diagnosis not present

## 2020-05-17 DIAGNOSIS — E039 Hypothyroidism, unspecified: Secondary | ICD-10-CM | POA: Diagnosis not present

## 2020-05-17 DIAGNOSIS — I2699 Other pulmonary embolism without acute cor pulmonale: Secondary | ICD-10-CM | POA: Diagnosis not present

## 2020-05-17 DIAGNOSIS — C3432 Malignant neoplasm of lower lobe, left bronchus or lung: Secondary | ICD-10-CM | POA: Diagnosis not present

## 2020-05-18 ENCOUNTER — Other Ambulatory Visit: Payer: Self-pay

## 2020-05-18 ENCOUNTER — Ambulatory Visit: Payer: Medicare HMO | Admitting: Cardiology

## 2020-05-18 ENCOUNTER — Encounter: Payer: Self-pay | Admitting: Cardiology

## 2020-05-18 VITALS — BP 100/60 | HR 82 | Ht 66.0 in | Wt 163.0 lb

## 2020-05-18 DIAGNOSIS — I428 Other cardiomyopathies: Secondary | ICD-10-CM | POA: Diagnosis not present

## 2020-05-18 DIAGNOSIS — I447 Left bundle-branch block, unspecified: Secondary | ICD-10-CM | POA: Diagnosis not present

## 2020-05-18 DIAGNOSIS — I10 Essential (primary) hypertension: Secondary | ICD-10-CM

## 2020-05-18 DIAGNOSIS — G4700 Insomnia, unspecified: Secondary | ICD-10-CM | POA: Diagnosis not present

## 2020-05-18 DIAGNOSIS — C3492 Malignant neoplasm of unspecified part of left bronchus or lung: Secondary | ICD-10-CM

## 2020-05-18 DIAGNOSIS — C771 Secondary and unspecified malignant neoplasm of intrathoracic lymph nodes: Secondary | ICD-10-CM | POA: Diagnosis not present

## 2020-05-18 DIAGNOSIS — I129 Hypertensive chronic kidney disease with stage 1 through stage 4 chronic kidney disease, or unspecified chronic kidney disease: Secondary | ICD-10-CM | POA: Diagnosis not present

## 2020-05-18 DIAGNOSIS — E039 Hypothyroidism, unspecified: Secondary | ICD-10-CM | POA: Diagnosis not present

## 2020-05-18 DIAGNOSIS — C7932 Secondary malignant neoplasm of cerebral meninges: Secondary | ICD-10-CM | POA: Diagnosis not present

## 2020-05-18 DIAGNOSIS — C3432 Malignant neoplasm of lower lobe, left bronchus or lung: Secondary | ICD-10-CM | POA: Diagnosis not present

## 2020-05-18 DIAGNOSIS — N1831 Chronic kidney disease, stage 3a: Secondary | ICD-10-CM | POA: Diagnosis not present

## 2020-05-18 DIAGNOSIS — C7951 Secondary malignant neoplasm of bone: Secondary | ICD-10-CM | POA: Diagnosis not present

## 2020-05-18 DIAGNOSIS — I2699 Other pulmonary embolism without acute cor pulmonale: Secondary | ICD-10-CM | POA: Diagnosis not present

## 2020-05-18 MED ORDER — LOSARTAN POTASSIUM 25 MG PO TABS: 25.0000 mg | ORAL_TABLET | Freq: Every day | ORAL | 3 refills | Status: DC

## 2020-05-18 NOTE — Progress Notes (Signed)
Cardiology Office Note:    Date:  05/18/2020   ID:  Stefanie Camacho, DOB 10/23/1945, MRN [id number]  PCP:  [name], MD  Cardiologist:  Jenne Campus, MD    Referring MD: [name], MD   Chief Complaint  Patient presents with  . Dizziness  . Weight Loss  . blood clots    History of Present Illness:    Stefanie Camacho is a 75 y.o. female with past medical history significant for nonischemic cardiomyopathy, left bundle branch block, previous ejection fraction 45%, but latest echocardiogram showed improvement to normalization.  She was a chronic smoker however she was found to have lung cancer with metastasis to the brain.  She is being aggressively and successfully treated by oncology team.  Also in November she was found to have significant PE that being treated with anticoagulation which I will continue.  She comes today to my office overall she is doing fine considering all the problems she has she said she is mostly in the wheelchair she had difficulty moving and walking around family helps a lot denies have any chest pain tightness squeezing pressure burning chest no shortness of breath no swelling of lower extremities.  Surprisingly when she came with a PE she have not much symptomatology.  Past Medical History:  Diagnosis Date  . Acute CVA (cerebrovascular accident) ([place]) 01/09/2020  . Adenocarcinoma of left lung, stage 4 ([place]) 01/18/2020  . Anxiety   . Blood clots in brain   . 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 Chronic kidney disease, stage 3a ([place]) 05/16/2020  . Chronic respiratory failure ([place]) 05/16/2020  . Cough 03/18/2020  . Daytime somnolence 05/16/2020  . Dyslipidemia   . Encounter for antineoplastic chemotherapy 01/18/2020  . Essential hypertension   . Gastro-esophageal reflux disease without esophagitis   . Generalized weakness 03/18/2020  . GERD (gastroesophageal reflux disease)   . Goals of care, counseling/discussion 01/18/2020  .  Hyperlipidemia 05/16/2020  . Hypothyroidism   . Hypothyroidism, unspecified   . Insomnia 05/16/2020  . Intracranial bleeding ([place]) 01/09/2020  . Left bundle branch block 06/04/2018  . 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 Long term (current) use of anticoagulants 05/16/2020  . Lumbar foraminal stenosis 12/30/2019  . Lung mass 05/16/2020  . 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[place]) 05/16/2020  . Major depression, single episode 05/16/2020  . Malignant neoplasm of lower lobe, left bronchus or lung ([place]) 01/14/2020  . NICM (nonischemic cardiomyopathy) ([place])   . Nonischemic cardiomyopathy ([place]) 06/04/2018   Ejection fraction 45% in summer 2019  . Nonobstructive cardiomyopathy ([place])   . Obstructive sleep apnea syndrome   . Rosacea 05/16/2020  . 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 Single subsegmental pulmonary embolism without acute cor pulmonale ([place]) 03/25/2020  . Sleep apnea   . Stress incontinence (female) (female) 05/16/2020  . TIA (transient ischemic attack) 01/08/2020  . Vertigo 09/25/2019    Past Surgical History:  Procedure Laterality Date  . ABDOMINAL HYSTERECTOMY    . BLADDER NECK RECONSTRUCTION    . BRONCHIAL BIOPSY  01/12/2020   Procedure: BRONCHIAL BIOPSIES;  Surgeon: Garner Nash, DO;  Location: [facility];  Service: Pulmonary;;  . BRONCHIAL BRUSHINGS  01/12/2020   Procedure: BRONCHIAL BRUSHINGS;  Surgeon: Garner Nash, DO;  Location: [facility];  Service: Pulmonary;;  . BRONCHIAL NEEDLE ASPIRATION BIOPSY  01/12/2020  Procedure: BRONCHIAL NEEDLE ASPIRATION BIOPSIES;  Surgeon: Garner Nash, DO;  Location: [facility];  Service: Pulmonary;;  . BRONCHIAL WASHINGS  01/12/2020   Procedure: BRONCHIAL WASHINGS;  Surgeon: Garner Nash, DO;  Location: [facility];  Service: Pulmonary;;  . SHOULDER SURGERY     . TONSILLECTOMY    . VIDEO BRONCHOSCOPY WITH ENDOBRONCHIAL ULTRASOUND  01/12/2020   Procedure: VIDEO BRONCHOSCOPY WITH ENDOBRONCHIAL ULTRASOUND;  Surgeon: Garner Nash, DO;  Location: [facility];  Service: Pulmonary;;    Current Medications: Current Meds  Medication Sig  . acetaminophen (TYLENOL) 500 MG tablet Take 1,000 mg by mouth every 6 (six) hours as needed for mild pain.  Marland Kitchen apixaban (ELIQUIS) 5 MG TABS tablet Take 1 tablet (5 mg total) by mouth 2 (two) times daily.  Marland Kitchen atorvastatin (LIPITOR) 10 MG tablet Take 1 tablet (10 mg total) by mouth daily at 6 PM.  . cephALEXin (KEFLEX) 500 MG capsule Take 500 mg by mouth in the morning and at bedtime.  Marland Kitchen FLUoxetine (PROZAC) 20 MG capsule Take 20 mg by mouth daily.  Marland Kitchen levETIRAcetam (KEPPRA) 500 MG tablet Take 1 tablet (500 mg total) by mouth 2 (two) times daily.  Marland Kitchen levothyroxine (SYNTHROID) 75 MCG tablet Take 1 tablet (75 mcg total) by mouth daily.  Marland Kitchen loperamide (IMODIUM) 2 MG capsule Take 2 mg by mouth as needed for diarrhea or loose stools.  Marland Kitchen losartan (COZAAR) 50 MG tablet Take 1 tablet (50 mg total) by mouth daily.  . metoprolol succinate (TOPROL-XL) 50 MG 24 hr tablet Take 1 tablet (50 mg total) by mouth daily. Take with or immediately following a meal.  . osimertinib mesylate (TAGRISSO) 80 MG tablet Take 2 tablets (160 mg total) by mouth daily. Days 1-28.  . pantoprazole (PROTONIX) 40 MG tablet Take 1 tablet by mouth once daily  . prochlorperazine (COMPAZINE) 10 MG tablet Take 1 tablet (10 mg total) by mouth every 6 (six) hours as needed for nausea or vomiting.  Marland Kitchen spironolactone (ALDACTONE) 25 MG tablet Take 0.5 tablets (12.5 mg total) by mouth daily.     Allergies:   Sertraline hcl   Social History   Socioeconomic History  . Marital status: Divorced    Spouse name: Not on file  . Number of children: Not on file  . Years of education: Not on file  . Highest education level: Not on file  Occupational History  . Not on file   Tobacco Use  . Smoking status: Never Smoker  . Smokeless tobacco: Never Used  Vaping Use  . Vaping Use: Never used  Substance and Sexual Activity  . Alcohol use: Not Currently  . Drug use: Never  . Sexual activity: Not on file  Other Topics Concern  . Not on file  Social History Narrative  . Not on file   Social Determinants of Health   Financial Resource Strain: Not on file  Food Insecurity: Not on file  Transportation Needs: Not on file  Physical Activity: Not on file  Stress: Not on file  Social Connections: Not on file     Family History: The patient's family history includes Heart disease in her father, maternal grandfather, mother, and paternal grandfather; Stroke in her father and mother. ROS:   Please see the history of present illness.    All 14 point review of systems negative except as described per history of present illness  EKGs/Labs/Other Studies Reviewed:      Recent Labs: 04/26/2020: ALT 9; BUN 14; Creatinine 0.84;  Hemoglobin 11.7; Platelet Count 144; Potassium 3.7; Sodium 139  Recent Lipid Panel    Component Value Date/Time   CHOL 127 01/09/2020 0416   CHOL 133 07/17/2018 0933   TRIG 112 01/09/2020 0416   HDL 38 (L) 01/09/2020 0416   HDL 40 07/17/2018 0933   CHOLHDL 3.3 01/09/2020 0416   VLDL 22 01/09/2020 0416   LDLCALC 67 01/09/2020 0416   LDLCALC 67 07/17/2018 0933    Physical Exam:    VS:  BP 100/60 (BP Location: Left Arm, Patient Position: Sitting)   Pulse 82   Ht 5\' 6"  (1.676 m)   Wt 163 lb (73.9 kg)   SpO2 97%   BMI 26.31 kg/m     Wt Readings from Last 3 Encounters:  05/18/20 163 lb (73.9 kg)  04/26/20 168 lb 6.4 oz (76.4 kg)  04/26/20 168 lb 6.4 oz (76.4 kg)     GEN:  Well nourished, well developed in no acute distress HEENT: Normal NECK: No JVD; No carotid bruits LYMPHATICS: No lymphadenopathy CARDIAC: RRR, no murmurs, no rubs, no gallops RESPIRATORY:  Clear to auscultation without rales, wheezing or rhonchi   ABDOMEN: Soft, non-tender, non-distended MUSCULOSKELETAL:  No edema; No deformity  SKIN: Warm and dry LOWER EXTREMITIES: no swelling NEUROLOGIC:  Alert and oriented x 3 PSYCHIATRIC:  Normal affect   ASSESSMENT:    1. Left bundle branch block   2. Nonischemic cardiomyopathy ([place])   3. Essential hypertension   4. Malignant neoplasm of lower lobe, left bronchus or lung ([place])   5. Adenocarcinoma of left lung, stage 4 ([facility])    PLAN:    In order of problems listed above:  1. Left bundle branch block.  Chronic issue.  Echocardiogram will be repeated to recheck left ventricle ejection fraction. 2. Nonischemic cardiomyopathy I will continue present medications however I will cut down her losartan to only 25 mg daily.  We will continue with beta-blockade for now we will get echocardiogram based on that we decide about therapy.  Concern is low blood pressure in the future we may be forced to discontinue or cut down more her medication for cardiomyopathy. 3. Essential hypertension: Opposite problem blood pressure being low. 4. Malignant neoplasm of lung that has been follow-up by oncology team.   Medication Adjustments/Labs and Tests Ordered: Current medicines are reviewed at length with the patient today.  Concerns regarding medicines are outlined above.  No orders of the defined types were placed in this encounter.  Medication changes: No orders of the defined types were placed in this encounter.   Signed, Park Liter, MD, [facility] 05/18/2020 9:32 AM    [place]

## 2020-05-18 NOTE — Patient Instructions (Signed)
Medication Instructions:  Your physician has recommended you make the following change in your medication:   1) Decrease Losartan to 25 mg, 1 tablet by mouth once a day 2) Take Eliquis 5 mg, 1 tablet by mouth twice a day  *If you need a refill on your cardiac medications before your next appointment, please call your pharmacy*  Lab Work: None ordered today  Testing/Procedures: Your physician has requested that you have an echocardiogram. Echocardiography is a painless test that uses sound waves to create images of your heart. It provides your doctor with information about the size and shape of your heart and how well your heart's chambers and valves are working. This procedure takes approximately one hour. There are no restrictions for this procedure.  Follow-Up: At [facility], you and your health needs are our priority.  As part of our continuing mission to provide you with exceptional heart care, we have created designated Provider Care Teams.  These Care Teams include your primary Cardiologist (physician) and Advanced Practice Providers (APPs -  Physician Assistants and Nurse Practitioners) who all work together to provide you with the care you need, when you need it.  Your next appointment:   5 month(s)  The format for your next appointment:   In Person  Provider:   You may see Jenne Campus, MD

## 2020-05-20 DIAGNOSIS — C3432 Malignant neoplasm of lower lobe, left bronchus or lung: Secondary | ICD-10-CM | POA: Diagnosis not present

## 2020-05-20 DIAGNOSIS — H0100A Unspecified blepharitis right eye, upper and lower eyelids: Secondary | ICD-10-CM | POA: Diagnosis not present

## 2020-05-20 DIAGNOSIS — N1831 Chronic kidney disease, stage 3a: Secondary | ICD-10-CM | POA: Diagnosis not present

## 2020-05-20 DIAGNOSIS — I2699 Other pulmonary embolism without acute cor pulmonale: Secondary | ICD-10-CM | POA: Diagnosis not present

## 2020-05-20 DIAGNOSIS — C771 Secondary and unspecified malignant neoplasm of intrathoracic lymph nodes: Secondary | ICD-10-CM | POA: Diagnosis not present

## 2020-05-20 DIAGNOSIS — E039 Hypothyroidism, unspecified: Secondary | ICD-10-CM | POA: Diagnosis not present

## 2020-05-20 DIAGNOSIS — I129 Hypertensive chronic kidney disease with stage 1 through stage 4 chronic kidney disease, or unspecified chronic kidney disease: Secondary | ICD-10-CM | POA: Diagnosis not present

## 2020-05-20 DIAGNOSIS — H0100B Unspecified blepharitis left eye, upper and lower eyelids: Secondary | ICD-10-CM | POA: Diagnosis not present

## 2020-05-20 DIAGNOSIS — G4700 Insomnia, unspecified: Secondary | ICD-10-CM | POA: Diagnosis not present

## 2020-05-20 DIAGNOSIS — C7932 Secondary malignant neoplasm of cerebral meninges: Secondary | ICD-10-CM | POA: Diagnosis not present

## 2020-05-20 DIAGNOSIS — H04123 Dry eye syndrome of bilateral lacrimal glands: Secondary | ICD-10-CM | POA: Diagnosis not present

## 2020-05-20 DIAGNOSIS — C7951 Secondary malignant neoplasm of bone: Secondary | ICD-10-CM | POA: Diagnosis not present

## 2020-05-20 DIAGNOSIS — I428 Other cardiomyopathies: Secondary | ICD-10-CM | POA: Diagnosis not present

## 2020-05-20 DIAGNOSIS — H524 Presbyopia: Secondary | ICD-10-CM | POA: Diagnosis not present

## 2020-05-23 ENCOUNTER — Telehealth: Payer: Self-pay | Admitting: Internal Medicine

## 2020-05-23 DIAGNOSIS — C3492 Malignant neoplasm of unspecified part of left bronchus or lung: Secondary | ICD-10-CM | POA: Diagnosis not present

## 2020-05-23 NOTE — Telephone Encounter (Signed)
Spoke with patient; appointments will be rescheduled for later in the week as patient said Diane had communicated with her. She will be expecting a call tomorrow

## 2020-05-24 ENCOUNTER — Inpatient Hospital Stay: Payer: Medicare HMO | Admitting: Internal Medicine

## 2020-05-24 ENCOUNTER — Inpatient Hospital Stay: Payer: Medicare HMO

## 2020-05-25 ENCOUNTER — Other Ambulatory Visit: Payer: Self-pay

## 2020-05-25 ENCOUNTER — Inpatient Hospital Stay: Payer: Medicare HMO | Attending: Internal Medicine

## 2020-05-25 ENCOUNTER — Inpatient Hospital Stay: Payer: Medicare HMO | Admitting: Internal Medicine

## 2020-05-25 ENCOUNTER — Telehealth: Payer: Self-pay | Admitting: Medical Oncology

## 2020-05-25 VITALS — BP 112/64 | HR 74 | Temp 96.6°F | Resp 14 | Ht 66.0 in | Wt 166.5 lb

## 2020-05-25 DIAGNOSIS — G4733 Obstructive sleep apnea (adult) (pediatric): Secondary | ICD-10-CM | POA: Insufficient documentation

## 2020-05-25 DIAGNOSIS — C7949 Secondary malignant neoplasm of other parts of nervous system: Secondary | ICD-10-CM | POA: Diagnosis not present

## 2020-05-25 DIAGNOSIS — E785 Hyperlipidemia, unspecified: Secondary | ICD-10-CM | POA: Insufficient documentation

## 2020-05-25 DIAGNOSIS — C3432 Malignant neoplasm of lower lobe, left bronchus or lung: Secondary | ICD-10-CM | POA: Insufficient documentation

## 2020-05-25 DIAGNOSIS — C7951 Secondary malignant neoplasm of bone: Secondary | ICD-10-CM | POA: Diagnosis not present

## 2020-05-25 DIAGNOSIS — N1831 Chronic kidney disease, stage 3a: Secondary | ICD-10-CM | POA: Insufficient documentation

## 2020-05-25 DIAGNOSIS — Z8673 Personal history of transient ischemic attack (TIA), and cerebral infarction without residual deficits: Secondary | ICD-10-CM | POA: Insufficient documentation

## 2020-05-25 DIAGNOSIS — Z5111 Encounter for antineoplastic chemotherapy: Secondary | ICD-10-CM | POA: Diagnosis not present

## 2020-05-25 DIAGNOSIS — Z7901 Long term (current) use of anticoagulants: Secondary | ICD-10-CM | POA: Diagnosis not present

## 2020-05-25 DIAGNOSIS — D649 Anemia, unspecified: Secondary | ICD-10-CM | POA: Insufficient documentation

## 2020-05-25 DIAGNOSIS — C3490 Malignant neoplasm of unspecified part of unspecified bronchus or lung: Secondary | ICD-10-CM

## 2020-05-25 DIAGNOSIS — K219 Gastro-esophageal reflux disease without esophagitis: Secondary | ICD-10-CM | POA: Insufficient documentation

## 2020-05-25 DIAGNOSIS — C7931 Secondary malignant neoplasm of brain: Secondary | ICD-10-CM | POA: Insufficient documentation

## 2020-05-25 DIAGNOSIS — F32A Depression, unspecified: Secondary | ICD-10-CM | POA: Insufficient documentation

## 2020-05-25 DIAGNOSIS — F419 Anxiety disorder, unspecified: Secondary | ICD-10-CM | POA: Diagnosis not present

## 2020-05-25 DIAGNOSIS — Z86711 Personal history of pulmonary embolism: Secondary | ICD-10-CM | POA: Diagnosis not present

## 2020-05-25 DIAGNOSIS — C7802 Secondary malignant neoplasm of left lung: Secondary | ICD-10-CM | POA: Insufficient documentation

## 2020-05-25 DIAGNOSIS — C3492 Malignant neoplasm of unspecified part of left bronchus or lung: Secondary | ICD-10-CM

## 2020-05-25 DIAGNOSIS — Z90710 Acquired absence of both cervix and uterus: Secondary | ICD-10-CM | POA: Insufficient documentation

## 2020-05-25 DIAGNOSIS — E039 Hypothyroidism, unspecified: Secondary | ICD-10-CM | POA: Insufficient documentation

## 2020-05-25 DIAGNOSIS — Z79899 Other long term (current) drug therapy: Secondary | ICD-10-CM | POA: Diagnosis not present

## 2020-05-25 DIAGNOSIS — R69 Illness, unspecified: Secondary | ICD-10-CM | POA: Diagnosis not present

## 2020-05-25 DIAGNOSIS — I10 Essential (primary) hypertension: Secondary | ICD-10-CM | POA: Insufficient documentation

## 2020-05-25 LAB — CBC WITH DIFFERENTIAL (CANCER CENTER ONLY)
Abs Immature Granulocytes: 0.01 10*3/uL (ref 0.00–0.07)
Basophils Absolute: 0 10*3/uL (ref 0.0–0.1)
Basophils Relative: 0 %
Eosinophils Absolute: 0.1 10*3/uL (ref 0.0–0.5)
Eosinophils Relative: 1 %
HCT: 35.1 % — ABNORMAL LOW (ref 36.0–46.0)
Hemoglobin: 11.1 g/dL — ABNORMAL LOW (ref 12.0–15.0)
Immature Granulocytes: 0 %
Lymphocytes Relative: 14 %
Lymphs Abs: 0.8 10*3/uL (ref 0.7–4.0)
MCH: 29.7 pg (ref 26.0–34.0)
MCHC: 31.6 g/dL (ref 30.0–36.0)
MCV: 93.9 fL (ref 80.0–100.0)
Monocytes Absolute: 0.4 10*3/uL (ref 0.1–1.0)
Monocytes Relative: 7 %
Neutro Abs: 4.7 10*3/uL (ref 1.7–7.7)
Neutrophils Relative %: 78 %
Platelet Count: 145 10*3/uL — ABNORMAL LOW (ref 150–400)
RBC: 3.74 MIL/uL — ABNORMAL LOW (ref 3.87–5.11)
RDW: 15.8 % — ABNORMAL HIGH (ref 11.5–15.5)
WBC Count: 5.9 10*3/uL (ref 4.0–10.5)
nRBC: 0 % (ref 0.0–0.2)

## 2020-05-25 LAB — CMP (CANCER CENTER ONLY)
ALT: 8 U/L (ref 0–44)
AST: 14 U/L — ABNORMAL LOW (ref 15–41)
Albumin: 3.4 g/dL — ABNORMAL LOW (ref 3.5–5.0)
Alkaline Phosphatase: 85 U/L (ref 38–126)
Anion gap: 8 (ref 5–15)
BUN: 16 mg/dL (ref 8–23)
CO2: 27 mmol/L (ref 22–32)
Calcium: 9.5 mg/dL (ref 8.9–10.3)
Chloride: 106 mmol/L (ref 98–111)
Creatinine: 0.91 mg/dL (ref 0.44–1.00)
GFR, Estimated: >60 mL/min (ref >60)
Glucose, Bld: 97 mg/dL (ref 70–99)
Potassium: 3.6 mmol/L (ref 3.5–5.1)
Sodium: 141 mmol/L (ref 135–145)
Total Bilirubin: 0.6 mg/dL (ref 0.3–1.2)
Total Protein: 6.6 g/dL (ref 6.5–8.1)

## 2020-05-25 NOTE — Telephone Encounter (Signed)
Stefanie Camacho's appts today have been moved up to 1000.She confirmed.

## 2020-05-25 NOTE — Progress Notes (Signed)
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 2) pulmonary embolus in the right main pulmonary artery diagnosed in November 2021.  Biomarker Findings Microsatellite status - MS-Stable Tumor Mutational Burden - 1 Muts/Mb Genomic Findings For a complete list of the genes assayed, please refer to the Appendix. EGFR exon 19 deletion ([id number]) 7 Disease relevant genes with no reportable alterations: ALK, BRAF, ERBB2, KRAS, MET, RET, ROS1  PDL1 expression: 2%.  PRIOR THERAPY: Whole brain radiation and radiotherapy to the osseous metastases under the care of Dr. Lisbeth Camacho. Last treatment expected on 02/02/20  CURRENT THERAPY:  1) Tagrisso160 mg p.o. daily. First dose on9/27/21. Status post 4 months of treatment. 2) Eliquis 5 mg p.o. twice daily  INTERVAL HISTORY: Stefanie Camacho 75 y.o. female returns to the clinic today for follow-up visit accompanied by her sister Stefanie Camacho.  The patient is feeling fine today with no concerning complaints except for the generalized fatigue as well as lack of appetite.  She did not have any significant weight loss since her last visit.  She denied having any current chest pain but has shortness of breath with exertion and she is currently on home oxygen with oxygen saturation of 100% on 2 L.  She denied having any current nausea, vomiting, but has 1 or 2 episodes of diarrhea and no constipation.  She she has no current headache or visual changes.  She has occasional dizzy spells with movement.  Her  cardiologist has reduced her blood pressure medication because of the dizzy spells.  She is here today for evaluation and repeat blood work.  MEDICAL HISTORY: Past Medical History:  Diagnosis Date  . Acute CVA (cerebrovascular accident) ([facility]) 01/09/2020  . Adenocarcinoma of left lung, stage 4 ([facility]) 01/18/2020  . Anxiety   . Blood clots in brain   . 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 Chronic kidney disease, stage 3a ([facility]) 05/16/2020  . Chronic respiratory failure ([facility]) 05/16/2020  . Cough 03/18/2020  . Daytime somnolence 05/16/2020  . Dyslipidemia   . Encounter for antineoplastic chemotherapy 01/18/2020  . Essential hypertension   . Gastro-esophageal reflux disease without esophagitis   . Generalized weakness 03/18/2020  . GERD (gastroesophageal reflux disease)   . Goals of care, counseling/discussion 01/18/2020  . Hyperlipidemia 05/16/2020  . Hypothyroidism   . Hypothyroidism, unspecified   . Insomnia 05/16/2020  . Intracranial bleeding ([facility]) 01/09/2020  . Left bundle branch block 06/04/2018  . 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 Long term (current) use of anticoagulants 05/16/2020  . Lumbar foraminal stenosis 12/30/2019  . Lung mass 05/16/2020  . 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[facility]) 05/16/2020  . Major depression, single episode 05/16/2020  . Malignant neoplasm of lower lobe, left bronchus or lung ([facility]) 01/14/2020  . NICM (nonischemic cardiomyopathy) ([facility])   . Nonischemic cardiomyopathy ([facility] [place] [facility]) 06/04/2018   Ejection fraction 45% in summer 2019  . Nonobstructive cardiomyopathy ([facility])   . Obstructive sleep apnea syndrome   . Rosacea 05/16/2020  . 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[facility]) 01/28/2020  . 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  [place]) 01/29/2020  . 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 Single subsegmental pulmonary embolism without acute cor pulmonale ([facility]) 03/25/2020  . Sleep apnea   . Stress  incontinence (female) (female) 05/16/2020  . TIA (transient ischemic attack) 01/08/2020  . Vertigo 09/25/2019    ALLERGIES:  is allergic to sertraline hcl.  MEDICATIONS:  Current Outpatient Medications  Medication Sig Dispense Refill  . acetaminophen (TYLENOL) 500 MG tablet Take 1,000 mg by mouth every 6 (six) hours as needed for mild pain.    Marland Kitchen apixaban (ELIQUIS) 5 MG TABS tablet Take 1 tablet (5 mg total) by mouth 2 (two) times daily. 60 tablet 1  . atorvastatin (LIPITOR) 10 MG tablet Take 1 tablet (10 mg total) by mouth daily at 6 PM. 90 tablet 3  . cephALEXin (KEFLEX) 500 MG capsule Take 500 mg by mouth in the morning and at bedtime.    Marland Kitchen FLUoxetine (PROZAC) 20 MG capsule Take 20 mg by mouth daily.    Marland Kitchen levETIRAcetam (KEPPRA) 500 MG tablet Take 1 tablet (500 mg total) by mouth 2 (two) times daily. 60 tablet 1  . levothyroxine (SYNTHROID) 75 MCG tablet Take 1 tablet (75 mcg total) by mouth daily. 30 tablet 1  . loperamide (IMODIUM) 2 MG capsule Take 2 mg by mouth as needed for diarrhea or loose stools.    Marland Kitchen losartan (COZAAR) 25 MG tablet Take 1 tablet (25 mg total) by mouth daily. 90 tablet 3  . metoprolol succinate (TOPROL-XL) 50 MG 24 hr tablet Take 1 tablet (50 mg total) by mouth daily. Take with or immediately following a meal. 90 tablet 2  . osimertinib mesylate (TAGRISSO) 80 MG tablet Take 2 tablets (160 mg total) by mouth daily. Days 1-28. 60 tablet 3  . pantoprazole (PROTONIX) 40 MG tablet Take 1 tablet by mouth once daily 30 tablet 0  . prochlorperazine (COMPAZINE) 10 MG tablet Take 1 tablet (10 mg total) by mouth every 6 (six) hours as needed for nausea or vomiting. 45 tablet 5  . spironolactone (ALDACTONE) 25 MG tablet Take 0.5 tablets (12.5 mg total) by mouth daily. 45 tablet 3   No current facility-administered medications for this visit.    SURGICAL HISTORY:  Past Surgical History:  Procedure Laterality Date  . ABDOMINAL HYSTERECTOMY    . BLADDER NECK RECONSTRUCTION    .  BRONCHIAL BIOPSY  01/12/2020   Procedure: BRONCHIAL BIOPSIES;  Surgeon: Garner Nash, DO;  Location: [facility];  Service: Pulmonary;;  . BRONCHIAL BRUSHINGS  01/12/2020   Procedure: BRONCHIAL BRUSHINGS;  Surgeon: Garner Nash, DO;  Location: [facility];  Service: Pulmonary;;  . BRONCHIAL NEEDLE ASPIRATION BIOPSY  01/12/2020   Procedure: BRONCHIAL NEEDLE ASPIRATION BIOPSIES;  Surgeon: Garner Nash, DO;  Location: [facility];  Service: Pulmonary;;  . BRONCHIAL WASHINGS  01/12/2020   Procedure: BRONCHIAL WASHINGS;  Surgeon: Garner Nash, DO;  Location: [facility];  Service: Pulmonary;;  . SHOULDER SURGERY    . TONSILLECTOMY    . VIDEO BRONCHOSCOPY WITH ENDOBRONCHIAL ULTRASOUND  01/12/2020   Procedure: VIDEO BRONCHOSCOPY WITH ENDOBRONCHIAL ULTRASOUND;  Surgeon: Garner Nash, DO;  Location: [facility];  Service: Pulmonary;;    REVIEW OF SYSTEMS:  Constitutional: positive for anorexia and fatigue Eyes: negative Ears, nose, mouth, throat, and face: negative Respiratory: positive for dyspnea on exertion Cardiovascular: negative Gastrointestinal: positive for diarrhea and nausea Genitourinary:negative Integument/breast: negative Hematologic/lymphatic: negative Musculoskeletal:positive for muscle weakness Neurological: negative Behavioral/Psych: negative Endocrine: negative Allergic/Immunologic: negative  PHYSICAL EXAMINATION: General appearance: alert, cooperative, fatigued and no distress Head: Normocephalic, without obvious abnormality, atraumatic Neck: no adenopathy, no JVD, supple, symmetrical, trachea midline and thyroid not enlarged, symmetric, no tenderness/mass/nodules Lymph nodes: Cervical, supraclavicular, and axillary nodes normal. Resp: clear to auscultation bilaterally Back: symmetric, no curvature. ROM normal. No CVA tenderness. Cardio: regular rate and rhythm, S1, S2 normal, no murmur, click, rub or gallop GI: soft, non-tender; bowel sounds normal; no  masses,  no organomegaly Extremities: extremities normal, atraumatic, no cyanosis or edema Neurologic: Alert and oriented X 3, normal strength and tone. Normal symmetric reflexes. Normal coordination and gait  ECOG PERFORMANCE STATUS: 1 - Symptomatic but completely ambulatory  Blood pressure 112/64, pulse 74, temperature (!) 96.6 F (35.9 C), temperature source Tympanic, resp. rate 14, height _0  (1.676 m), weight 166 lb 8 oz (75.5 kg), SpO2 100 %.  LABORATORY DATA: Lab Results  Component Value Date   WBC 5.9 05/25/2020   HGB 11.1 (L) 05/25/2020   HCT 35.1 (L) 05/25/2020   MCV 93.9 05/25/2020   PLT 145 (L) 05/25/2020      Chemistry      Component Value Date/Time   NA 139 04/26/2020 1002   K 3.7 04/26/2020 1002   CL 104 04/26/2020 1002   CO2 27 04/26/2020 1002   BUN 14 04/26/2020 1002   CREATININE 0.84 04/26/2020 1002      Component Value Date/Time   CALCIUM 9.5 04/26/2020 1002   ALKPHOS 145 (H) 04/26/2020 1002   AST 13 (L) 04/26/2020 1002   ALT 9 04/26/2020 1002   BILITOT 0.5 04/26/2020 1002       RADIOGRAPHIC STUDIES: DG Lumbar Spine 2-3 Views  Result Date: 04/26/2020 CLINICAL DATA:  Metastatic lung cancer with pelvic metastasis. Fall with low back and right hip pain. EXAM: LUMBAR SPINE - 2-3 VIEW COMPARISON:  12/07/2019 MRI. Outside plain film 11/16/2019, without report. FINDINGS: Five lumbar type vertebral bodies. Sacroiliac joints are symmetric. Mild superior endplate compression deformity at L1, new since 12/07/2019. No gross ventral canal encroachment. Lumbosacral spondylosis, with degenerate disc disease at L3-4 through L5-S1. Facet arthropathy at L4-5 and L5-S1. IMPRESSION: Mild L1 superior endplate compression deformity, new since 12/07/2019. Electronically Signed   By: Abigail Miyamoto M.D.   On: 04/26/2020 11:59   DG Hip Unilat W or W/O Pelvis 1 View Right  Result Date: 04/26/2020 CLINICAL DATA:  Right hip pain in a patient with a history of metastatic lung  cancer. No known injury. EXAM: DG HIP (WITH OR WITHOUT PELVIS) 1V RIGHT COMPARISON:  Plain films right hip 11/13/2019. CT chest, abdomen and pelvis 03/25/2020. FINDINGS: No fracture or dislocation. Scattered, subtle sclerotic lesions are seen in both hips and in the pelvis. Bones have a somewhat mottled appearance. IMPRESSION: No acute abnormality. No change in the appearance of metastatic disease. Electronically Signed   By: Inge Rise M.D.   On: 04/26/2020 11:59    ASSESSMENT AND PLAN: This is a very pleasant 75 years old white female recently diagnosed with a stage IV non-small cell lung cancer, adenocarcinoma in September 2021 and presented with left lower lobe lung mass in addition to extensive bilateral pulmonary metastasis and anterior mediastinal lymphadenopathy as well as a skeletal metastasis and metastatic disease to the brain with leptomeningeal disease.  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 She underwent whole brain irradiation and currently undergoing treatment with targeted therapy with Tagrisso 160 mg p.o. daily  because of the leptomeningeal disease status post 4 months of treatment.  The patient continues to tolerate her treatment with Tagrisso fairly well with no concerning adverse effects except for 1 or 2 episodes of diarrhea intermittently.  She also has occasional nausea. Her lab work today showed mild anemia with hemoglobin of 11.1.  Comprehensive metabolic panel is still pending. I recommended for the patient to continue her current treatment with Tagrisso with the same dose. I will see her back for follow-up visit in 1 months for evaluation with repeat blood work as well as CT scan of the chest, abdomen pelvis for restaging of her disease. For the leptomeningeal disease, her last MRI of the brain showed improvement of her disease. For the pulmonary embolism, she will continue her current treatment with  Eliquis. For the history of depression, the patient will continue on Prozac 10 mg p.o. daily. She was advised to call immediately if she has any other concerning symptoms in the interval. The patient voices understanding of current disease status and treatment options and is in agreement with the current care plan.  All questions were answered. The patient knows to call the clinic with any problems, questions or concerns. We can certainly see the patient much sooner if necessary.  Disclaimer: This note was dictated with voice recognition software. Similar sounding words can inadvertently be transcribed and may not be corrected upon review.

## 2020-05-27 DIAGNOSIS — C771 Secondary and unspecified malignant neoplasm of intrathoracic lymph nodes: Secondary | ICD-10-CM | POA: Diagnosis not present

## 2020-05-27 DIAGNOSIS — I129 Hypertensive chronic kidney disease with stage 1 through stage 4 chronic kidney disease, or unspecified chronic kidney disease: Secondary | ICD-10-CM | POA: Diagnosis not present

## 2020-05-27 DIAGNOSIS — C3432 Malignant neoplasm of lower lobe, left bronchus or lung: Secondary | ICD-10-CM | POA: Diagnosis not present

## 2020-05-27 DIAGNOSIS — C7932 Secondary malignant neoplasm of cerebral meninges: Secondary | ICD-10-CM | POA: Diagnosis not present

## 2020-05-27 DIAGNOSIS — N1831 Chronic kidney disease, stage 3a: Secondary | ICD-10-CM | POA: Diagnosis not present

## 2020-05-27 DIAGNOSIS — I2699 Other pulmonary embolism without acute cor pulmonale: Secondary | ICD-10-CM | POA: Diagnosis not present

## 2020-05-27 DIAGNOSIS — I428 Other cardiomyopathies: Secondary | ICD-10-CM | POA: Diagnosis not present

## 2020-05-27 DIAGNOSIS — C7951 Secondary malignant neoplasm of bone: Secondary | ICD-10-CM | POA: Diagnosis not present

## 2020-05-27 DIAGNOSIS — E039 Hypothyroidism, unspecified: Secondary | ICD-10-CM | POA: Diagnosis not present

## 2020-05-27 DIAGNOSIS — G4700 Insomnia, unspecified: Secondary | ICD-10-CM | POA: Diagnosis not present

## 2020-05-30 ENCOUNTER — Telehealth: Payer: Self-pay | Admitting: Cardiology

## 2020-05-30 ENCOUNTER — Telehealth: Payer: Self-pay | Admitting: Internal Medicine

## 2020-05-30 ENCOUNTER — Telehealth: Payer: Self-pay | Admitting: Medical Oncology

## 2020-05-30 DIAGNOSIS — I2693 Single subsegmental pulmonary embolism without acute cor pulmonale: Secondary | ICD-10-CM

## 2020-05-30 MED ORDER — APIXABAN 5 MG PO TABS: 5.0000 mg | ORAL_TABLET | Freq: Two times a day (BID) | ORAL | 1 refills | Status: DC

## 2020-05-30 NOTE — Telephone Encounter (Signed)
   *  STAT* If patient is at the pharmacy, call can be transferred to refill team.   1. Which medications need to be refilled? (please list name of each medication and dose if known) apixaban (ELIQUIS) 5 MG TABS tablet  2. Which pharmacy/location (including street and city if local pharmacy) is medication to be sent to? [place], [place]  3. Do they need a 30 day or 90 day supply? 90 days

## 2020-05-30 NOTE — Telephone Encounter (Signed)
Called to inform patient of her upcoming appointments. Patient is aware.

## 2020-05-30 NOTE — Telephone Encounter (Signed)
Refill  She will be out of  tagrisso in 15 days 2/18- - scan and f/u Do you want me to send refill?   pt will  tell CS she wants to drink water based contrast.

## 2020-05-30 NOTE — Telephone Encounter (Signed)
Prescription refill request for Eliquis received. Indication: PE Last office visit: 05/18/20 Scr:0.91 Age: 75 Weight: 75kg

## 2020-05-30 NOTE — Telephone Encounter (Signed)
Yes Ok to refill.

## 2020-05-31 ENCOUNTER — Encounter: Payer: Self-pay | Admitting: Medical Oncology

## 2020-05-31 ENCOUNTER — Other Ambulatory Visit: Payer: Self-pay | Admitting: Pharmacist

## 2020-05-31 ENCOUNTER — Other Ambulatory Visit: Payer: Self-pay | Admitting: Medical Oncology

## 2020-05-31 DIAGNOSIS — I2699 Other pulmonary embolism without acute cor pulmonale: Secondary | ICD-10-CM | POA: Diagnosis not present

## 2020-05-31 DIAGNOSIS — C771 Secondary and unspecified malignant neoplasm of intrathoracic lymph nodes: Secondary | ICD-10-CM | POA: Diagnosis not present

## 2020-05-31 DIAGNOSIS — C3492 Malignant neoplasm of unspecified part of left bronchus or lung: Secondary | ICD-10-CM

## 2020-05-31 DIAGNOSIS — E039 Hypothyroidism, unspecified: Secondary | ICD-10-CM | POA: Diagnosis not present

## 2020-05-31 DIAGNOSIS — G4700 Insomnia, unspecified: Secondary | ICD-10-CM | POA: Diagnosis not present

## 2020-05-31 DIAGNOSIS — I129 Hypertensive chronic kidney disease with stage 1 through stage 4 chronic kidney disease, or unspecified chronic kidney disease: Secondary | ICD-10-CM | POA: Diagnosis not present

## 2020-05-31 DIAGNOSIS — I428 Other cardiomyopathies: Secondary | ICD-10-CM | POA: Diagnosis not present

## 2020-05-31 DIAGNOSIS — N1831 Chronic kidney disease, stage 3a: Secondary | ICD-10-CM | POA: Diagnosis not present

## 2020-05-31 DIAGNOSIS — C7951 Secondary malignant neoplasm of bone: Secondary | ICD-10-CM | POA: Diagnosis not present

## 2020-05-31 DIAGNOSIS — C3432 Malignant neoplasm of lower lobe, left bronchus or lung: Secondary | ICD-10-CM | POA: Diagnosis not present

## 2020-05-31 DIAGNOSIS — C7932 Secondary malignant neoplasm of cerebral meninges: Secondary | ICD-10-CM | POA: Diagnosis not present

## 2020-05-31 MED ORDER — OSIMERTINIB MESYLATE 80 MG PO TABS: 160.0000 mg | ORAL_TABLET | Freq: Every day | ORAL | 3 refills | Status: DC

## 2020-05-31 NOTE — Progress Notes (Signed)
Oral Oncology Pharmacist Encounter  Prescription refill for Tagrisso (osimertinib) sent to [facility] in error. Patient enrolled in manufacturer assistance and receives medication through [facility]. Prescription redirected to [facility] for dispensing.  Leron Croak, PharmD, BCPS Hematology/Oncology Clinical Pharmacist [facility] [phone] 05/31/2020 11:34 AM

## 2020-06-06 ENCOUNTER — Ambulatory Visit (HOSPITAL_BASED_OUTPATIENT_CLINIC_OR_DEPARTMENT_OTHER)
Admission: RE | Admit: 2020-06-06 | Discharge: 2020-06-06 | Disposition: A | Discharge status: Home / Self Care | Payer: Medicare HMO | Source: Ambulatory Visit | Attending: Cardiology | Admitting: Cardiology

## 2020-06-06 ENCOUNTER — Other Ambulatory Visit: Payer: Self-pay

## 2020-06-06 DIAGNOSIS — I428 Other cardiomyopathies: Secondary | ICD-10-CM

## 2020-06-08 LAB — ECHOCARDIOGRAM COMPLETE
Area-P 1/2: 2.39 cm2
MV M vel: 6.29 m/s
MV Peak grad: 158.3 mmHg
S' Lateral: 3.67 cm

## 2020-06-10 ENCOUNTER — Telehealth: Payer: Self-pay | Admitting: Internal Medicine

## 2020-06-10 ENCOUNTER — Other Ambulatory Visit: Payer: Self-pay | Admitting: Radiation Therapy

## 2020-06-10 DIAGNOSIS — H0100A Unspecified blepharitis right eye, upper and lower eyelids: Secondary | ICD-10-CM | POA: Diagnosis not present

## 2020-06-10 DIAGNOSIS — H0100B Unspecified blepharitis left eye, upper and lower eyelids: Secondary | ICD-10-CM | POA: Diagnosis not present

## 2020-06-10 DIAGNOSIS — H04123 Dry eye syndrome of bilateral lacrimal glands: Secondary | ICD-10-CM | POA: Diagnosis not present

## 2020-06-10 NOTE — Telephone Encounter (Signed)
Rescheduled upcoming lab appointment per 2/4 schedule message. Patient is aware of changes.

## 2020-06-13 ENCOUNTER — Other Ambulatory Visit: Payer: Self-pay | Admitting: Medical Oncology

## 2020-06-13 DIAGNOSIS — C3492 Malignant neoplasm of unspecified part of left bronchus or lung: Secondary | ICD-10-CM

## 2020-06-13 MED ORDER — OSIMERTINIB MESYLATE 80 MG PO TABS: 160.0000 mg | ORAL_TABLET | Freq: Every day | ORAL | 0 refills | Status: DC

## 2020-06-14 DIAGNOSIS — C7951 Secondary malignant neoplasm of bone: Secondary | ICD-10-CM | POA: Diagnosis not present

## 2020-06-14 DIAGNOSIS — C3432 Malignant neoplasm of lower lobe, left bronchus or lung: Secondary | ICD-10-CM | POA: Diagnosis not present

## 2020-06-14 DIAGNOSIS — C7932 Secondary malignant neoplasm of cerebral meninges: Secondary | ICD-10-CM | POA: Diagnosis not present

## 2020-06-14 DIAGNOSIS — I2699 Other pulmonary embolism without acute cor pulmonale: Secondary | ICD-10-CM | POA: Diagnosis not present

## 2020-06-14 DIAGNOSIS — I129 Hypertensive chronic kidney disease with stage 1 through stage 4 chronic kidney disease, or unspecified chronic kidney disease: Secondary | ICD-10-CM | POA: Diagnosis not present

## 2020-06-14 DIAGNOSIS — N1831 Chronic kidney disease, stage 3a: Secondary | ICD-10-CM | POA: Diagnosis not present

## 2020-06-14 DIAGNOSIS — E039 Hypothyroidism, unspecified: Secondary | ICD-10-CM | POA: Diagnosis not present

## 2020-06-14 DIAGNOSIS — I428 Other cardiomyopathies: Secondary | ICD-10-CM | POA: Diagnosis not present

## 2020-06-14 DIAGNOSIS — G4700 Insomnia, unspecified: Secondary | ICD-10-CM | POA: Diagnosis not present

## 2020-06-14 DIAGNOSIS — C771 Secondary and unspecified malignant neoplasm of intrathoracic lymph nodes: Secondary | ICD-10-CM | POA: Diagnosis not present

## 2020-06-16 ENCOUNTER — Other Ambulatory Visit: Payer: Self-pay

## 2020-06-16 ENCOUNTER — Ambulatory Visit: Payer: Medicare HMO | Attending: Family Medicine

## 2020-06-16 DIAGNOSIS — R296 Repeated falls: Secondary | ICD-10-CM | POA: Diagnosis not present

## 2020-06-16 DIAGNOSIS — H8112 Benign paroxysmal vertigo, left ear: Secondary | ICD-10-CM | POA: Insufficient documentation

## 2020-06-16 DIAGNOSIS — M6281 Muscle weakness (generalized): Secondary | ICD-10-CM | POA: Diagnosis not present

## 2020-06-16 DIAGNOSIS — R2689 Other abnormalities of gait and mobility: Secondary | ICD-10-CM | POA: Diagnosis not present

## 2020-06-16 DIAGNOSIS — R42 Dizziness and giddiness: Secondary | ICD-10-CM | POA: Insufficient documentation

## 2020-06-16 NOTE — Therapy (Signed)
[facility] [address] [place], [place], [address] Phone: [phone]   Fax:  [phone]  Physical Therapy Evaluation  Patient Details  Name: Stefanie Camacho MRN: [id number] Date of Birth: [birth year] Referring Provider (PT): Dr. [name]   Encounter Date: 06/16/2020   PT End of Session - 06/16/20 1206    Visit Number 1    Number of Visits 17    Date for PT Re-Evaluation 08/15/20    Authorization Type [employer]    Progress Note Due on Visit 10    PT Start Time 0931    PT Stop Time 1013    PT Time Calculation (min) 42 min    Equipment Utilized During Treatment Other (comment)   S for safety   Activity Tolerance Patient tolerated treatment well    Behavior During Therapy [facility] for tasks assessed/performed           Past Medical History:  Diagnosis Date  . Acute CVA (cerebrovascular accident) ([place]) 01/09/2020  . Adenocarcinoma of left lung, stage 4 ([place]) 01/18/2020  . Anxiety   . Blood clots in brain   . 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 Chronic kidney disease, stage 3a ([place]) 05/16/2020  . Chronic respiratory failure ([place]) 05/16/2020  . Cough 03/18/2020  . Daytime somnolence 05/16/2020  . Dyslipidemia   . Encounter for antineoplastic chemotherapy 01/18/2020  . Essential hypertension   . Gastro-esophageal reflux disease without esophagitis   . Generalized weakness 03/18/2020  . GERD (gastroesophageal reflux disease)   . Goals of care, counseling/discussion 01/18/2020  . Hyperlipidemia 05/16/2020  . Hypothyroidism   . Hypothyroidism, unspecified   . Insomnia 05/16/2020  . Intracranial bleeding ([place]) 01/09/2020  . Left bundle branch block 06/04/2018  . 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 Long term (current) use of anticoagulants 05/16/2020  . Lumbar foraminal stenosis 12/30/2019  . Lung mass 05/16/2020  . 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[place]) 05/16/2020  . Major depression,  single episode 05/16/2020  . Malignant neoplasm of lower lobe, left bronchus or lung ([place]) 01/14/2020  . NICM (nonischemic cardiomyopathy) ([place])   . Nonischemic cardiomyopathy ([place]) 06/04/2018   Ejection fraction 45% in summer 2019  . Nonobstructive cardiomyopathy ([place])   . Obstructive sleep apnea syndrome   . Rosacea 05/16/2020  . 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[place]) 01/28/2020  . 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[place]) 01/29/2020  . Secondary malignant neoplasm of intrathoracic lymph nodes ([place]) 05/16/2020  . Single subsegmental pulmonary embolism without acute cor pulmonale ([place]) 03/25/2020  . Sleep apnea   . Stress incontinence (female) (female) 05/16/2020  . TIA (transient ischemic attack) 01/08/2020  . Vertigo 09/25/2019    Past Surgical History:  Procedure Laterality Date  . ABDOMINAL HYSTERECTOMY    . BLADDER NECK RECONSTRUCTION    . BRONCHIAL BIOPSY  01/12/2020   Procedure: BRONCHIAL BIOPSIES;  Surgeon: Garner Nash, DO;  Location: [facility];  Service: Pulmonary;;  . BRONCHIAL BRUSHINGS  01/12/2020   Procedure: BRONCHIAL BRUSHINGS;  Surgeon: Garner Nash, DO;  Location: [facility];  Service: Pulmonary;;  . BRONCHIAL NEEDLE ASPIRATION BIOPSY  01/12/2020   Procedure: BRONCHIAL NEEDLE ASPIRATION BIOPSIES;  Surgeon: Garner Nash, DO;  Location: [facility];  Service: Pulmonary;;  . BRONCHIAL WASHINGS  01/12/2020   Procedure: BRONCHIAL WASHINGS;  Surgeon: Garner Nash, DO;  Location: [facility];  Service: Pulmonary;;  . SHOULDER SURGERY    . TONSILLECTOMY    . VIDEO BRONCHOSCOPY WITH  ENDOBRONCHIAL ULTRASOUND  01/12/2020   Procedure: VIDEO BRONCHOSCOPY WITH ENDOBRONCHIAL ULTRASOUND;  Surgeon: Garner Nash, DO;  Location: [facility];  Service: Pulmonary;;    There were no vitals filed for this visit.    Subjective Assessment - 06/16/20 0944    Subjective Pt stated dizziness began a few months ago and she has balance issues 2/2 dizziness and BLE  weakness. Pt diagnosed 12/2019 with L adenocarcinoma and brain mets and R hip bone mets. Pt amb. to exam room with RW, pt has hx of 3-4 falls in Dec. 2021. Pt had HHPT in 1/22 and was d/c.  Pt stated turning over in bed incr. dizziness (L used to make it worse but it has improved a bit). She has to stop, rest, close eyes for dizziness to decr. Pt describes dizziness as spinning a few months ago, but is now a wooziness. Dizziness began last summer, she can't recall if it was before or after chemo/radiation. Radiation has stopped and she's taking chemo pill. She has hx of dizziness and is familar to this clinic. Pt describes dizziness at worst as 2-3/10 and at best 0/10. Pt denied weakness, tinnitus, or HOH. Dizziness lasts about 10 seconds. HHPT performed assessments and treatment for vertigo, without much improvement.    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 No/denies   not at rest but states she'll probably feel pain in low back upon standing             [facility] PT Assessment - 06/16/20 0953      Assessment   Medical Diagnosis Vertigo    Referring Provider (PT) Dr. [name]    Onset Date/Surgical Date 12/15/19   sometime this summer per pt   Hand Dominance Right    Prior Therapy OPPT and HHPT      Precautions   Precautions Fall;Other (comment)   R PE (being monitored) and L lung CA with brain and R hip mets     Balance Screen   Has the patient fallen in the past 6 months Yes    How many times? 3-4    Has the patient had a decrease in activity level because of a fear of falling?  Yes    Is the patient reluctant to leave their home  because of a fear of falling?  No      Home Environment   Living Environment Private residence    Living Arrangements Alone   children and caregiver stay with pt   Available Help at Discharge Available 24 hours/day    Type of [place] to enter    Entrance Stairs-Number of Steps 2, four inch high steps    Entrance Stairs-Rails None    Home Layout One level    Home Equipment [place] - 2 wheels;Bedside commode;Transport chair      Prior Function   Level of Independence Independent    Vocation Retired    Leisure Per pt: She doesn't do anything now:[employer] She used to enjoy going to eat with friends, walk around neighborhood      Cognition  Overall Cognitive Status Within Functional Limits for tasks assessed      Sensation   Additional Comments No c/o N/T      Transfers   Transfers Stand to Sit;Sit to Stand    Sit to Stand 5: Supervision;With upper extremity assist;From chair/3-in-1    Stand to Sit 5: Supervision;With upper extremity assist;To chair/3-in-1      Ambulation/Gait   Ambulation/Gait Yes    Ambulation/Gait Assistance 5: Supervision    Ambulation/Gait Assistance Details S to ensure safety.  Pt reported slight SOB after amb.    Ambulation Distance (Feet) 100 Feet    Assistive device Rolling walker    Gait Pattern Step-through pattern;Decreased stride length;Decreased dorsiflexion - right;Decreased dorsiflexion - left   rounded shoulders   Ambulation Surface Level;Indoor    Gait velocity 1.76ft/sec with RW         MMT not performed but weakness suspected 2/2 gait deviations.          Vestibular Assessment - 06/16/20 1002      Vestibular Assessment   General Observation See assessment.      Symptom Behavior   Subjective history of current problem Pt with hx of dizziness with most recent bout starting summer of 2021.    Type of Dizziness  --   spinning with now more wooziness   Frequency of Dizziness Daily    Duration of Dizziness 10 sec.  and then wooziness    Symptom Nature Positional;Motion provoked    Aggravating Factors Rolling to left;Supine to sit;Forward bending    Relieving Factors Rest;Closing eyes;Head stationary    Progression of Symptoms Better      Oculomotor Exam   Oculomotor Alignment Normal    Smooth Pursuits Saccades    Saccades Overshoots    Comment Pt reported diplopia at approx. 6" from nose during convergence testing.      Oculomotor Exam-Fixation Suppressed    Left Head Impulse 1-2 corrective saccades with wooziness    Right Head Impulse 1 corrective saccade with wooziness.      Vestibulo-Ocular Reflex   VOR 1 Head Only (x 1 viewing) Pt reported wooziness during VOR testing and performed in guarded manner.              Objective measurements completed on examination: See above findings.               PT Education - 06/16/20 1206    Education Details PT educated pt on exam findings, PT POC, duration and frequency.    Person(s) Educated Patient    Methods Explanation    Comprehension Verbalized understanding            PT Short Term Goals - 06/16/20 1215      PT SHORT TERM GOAL #1   Title Patient will be IND with initial HEP to improve dizziness and balance. TARGET DATE FOR ALL STGS: 07/14/20    Time 4    Period Weeks    Status New      PT SHORT TERM GOAL #2   Title Perform BERG and write goals as indicated.    Time 4    Period Weeks      PT SHORT TERM GOAL #3   Title Pt will improve gait speed with RW to >/=2.48ft/sec. to decr. falls risk.    Baseline 1.14ft/sec with RW    Time 4    Period Weeks    Status New      PT SHORT TERM GOAL #4  Title Complete vestibular exam and write goals as indicated.    Time 4    Period Weeks    Status New      PT SHORT TERM GOAL #5   Title Pt will amb. 300' over even terrain with LRAD at MOD I level to improve funcitonal mobility.    Time 4    Period Weeks             PT Long Term Goals - 06/16/20 1218      PT LONG  TERM GOAL #1   Title Pt will report no falls in the last four weeks to improve safety. TARGET DATE FOR ALL LTGS: 08/11/20    Time 8    Period Weeks    Status New      PT LONG TERM GOAL #2   Title Pt will amb. 500' over even and paved surfaces at MOD I level with LRAD to improve functional mobility.    Time 8    Period Weeks    Status New      PT LONG TERM GOAL #3   Title Pt will report dizziness during all activities is </=2/10 to improve safety and QOL.    Time 8    Period Weeks    Status New                  Plan - 06/16/20 1208    Clinical Impression Statement Pt presenting to OPPT neuro for dizziness. 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 Pt's gait speed indicated pt is at risk for falls. Pt's oculomotor exam indicated vestibular hypofunction and PT will assess for positional vertigo next session as time was limited 2/2 time constraints. Pt's dizziness is likley multifactorial as exam was consistent with peripheral and central vertigo impairments. PT will also formally assess balance next session. Pt would benefit from skilled PT to improve safety and dizziness during functional mobility.    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, Cardiomyopathy, HTN, L BBB, R PE, TIA, hypothyroidism, Kidney disease, anxiety, HLD, stress incontinence, OSA, Lx foraminal stenosis, B cataract extraction in May or June 2021-she feels like she needs long distance glasses    Examination-Activity Limitations Bed  Mobility;Bend;Carry;Transfers;Locomotion Level;Squat;Lift;Toileting    Examination-Participation Restrictions Cleaning;Meal Prep;Interpersonal Relationship;Driving    Stability/Clinical Decision Making Unstable/Unpredictable    Clinical Decision Making High    Rehab Potential Fair   based on stage 4 lung CA with mets to brain and R hip   PT Frequency 2x / week    PT Duration 8 weeks    PT Treatment/Interventions ADLs/Self Care Home Management;Biofeedback;Canalith Repostioning;DME Instruction;Gait training;Stair training;Functional mobility training;Therapeutic activities;Therapeutic exercise;Balance training;Neuromuscular re-education;Orthotic Fit/Training;Patient/family education;Joint Manipulations    PT Next Visit Plan Assess for positional vertigo and treat as indicated. Perform BERG and write goals. Begin balance HEP.    Consulted and Agree with Plan of Care Patient           Patient will benefit from skilled therapeutic intervention in order to improve the following deficits and impairments:  Abnormal gait,Decreased endurance,Decreased knowledge of use of DME,Decreased balance,Decreased mobility,Decreased strength,Dizziness,Postural dysfunction  Visit  Diagnosis: Dizziness and giddiness - Plan: PT plan of care cert/re-cert  Other abnormalities of gait and mobility - Plan: PT plan of care cert/re-cert  Muscle weakness (generalized) - Plan: PT plan of care cert/re-cert  Repeated falls - Plan: PT plan of care cert/re-cert     Problem List Patient Active Problem List   Diagnosis Date Noted  . 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 Chronic kidney disease, stage 3a ([place]) 05/16/2020  . Chronic respiratory failure ([place]) 05/16/2020  . Daytime somnolence 05/16/2020  . Hyperlipidemia 05/16/2020  . Insomnia 05/16/2020  . Long term (current) use of anticoagulants 05/16/2020  . Lung mass 05/16/2020  . 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[place]) 05/16/2020  . Major  depression, single episode 05/16/2020  . Secondary malignant neoplasm of intrathoracic lymph nodes ([place]) 05/16/2020  . Rosacea 05/16/2020  . Stress incontinence (female) (female) 05/16/2020  . Single subsegmental pulmonary embolism without acute cor pulmonale ([place]) 03/25/2020  . Cough 03/18/2020  . Generalized weakness 03/18/2020  . 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[place]) 01/29/2020  . 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[place]) 01/28/2020  . Adenocarcinoma of left lung, stage 4 ([place]) 01/18/2020  . Encounter for antineoplastic chemotherapy 01/18/2020  . Goals of care, counseling/discussion 01/18/2020  . Malignant neoplasm of lower lobe, left bronchus or lung ([place]) 01/14/2020  . Acute CVA (cerebrovascular accident) ([place]) 01/09/2020  . Intracranial bleeding ([place]) 01/09/2020  . TIA (transient ischemic attack) 01/08/2020  . Lumbar foraminal stenosis 12/30/2019  . Vertigo 09/25/2019  . Obstructive sleep apnea syndrome   . Gastro-esophageal reflux disease without esophagitis   . Anxiety   . Nonobstructive cardiomyopathy ([place])   . Hypothyroidism, unspecified   . Nonischemic cardiomyopathy ([place]) 06/04/2018  . Essential hypertension 06/04/2018  . Left bundle branch block 06/04/2018  . Dyslipidemia 06/04/2018    Miller,Jennifer L 06/16/2020, 12:21 PM  [facility] [address] [place], [place], [address] Phone: [phone]   Fax:  [phone]  Name: Laylee Schooley MRN: [id number] Date of Birth: [birth year]  Geoffry Paradise, PT,DPT 06/16/20 12:21 PM Phone: [phone] Fax: [phone]

## 2020-06-20 ENCOUNTER — Telehealth: Payer: Self-pay | Admitting: Medical Oncology

## 2020-06-20 NOTE — Telephone Encounter (Signed)
Tagrisso - Pt is out of Tagrisso.  [facility]- I LVM to call me back re Tagrisso refill that was sent and receipt obtained yet pt has not received refill .  I  gave verbal instructions for refill per 2/7 rx. Pt notified.

## 2020-06-20 NOTE — Telephone Encounter (Signed)
Refill called in on VM .

## 2020-06-21 ENCOUNTER — Telehealth: Payer: Self-pay | Admitting: Medical Oncology

## 2020-06-21 ENCOUNTER — Telehealth: Payer: Self-pay

## 2020-06-21 NOTE — Telephone Encounter (Signed)
Pt has not received any Tagrisso. I instructed her to contact pharmacy services.

## 2020-06-21 NOTE — Telephone Encounter (Signed)
I followed up with [place] and [place] about Ms. Tuckers re-enrollment and refill of Tagrisso.  She is re-enrolled for free drug 06/20/20-05/06/21.  [facility] has mailed her shipment of Tagrisso and she should receive it within 3-5 business days.  [facility] Phone [phone] Fax [phone] 06/21/2020 2:05 PM

## 2020-06-21 NOTE — Telephone Encounter (Signed)
Patient is approved for Tagrisso at no charge from [facility] and [place] 06/20/20-05/06/21.  [place] and [place] uses [facility] [place] Phone [phone] Fax [phone] 06/21/2020 2:06 PM

## 2020-06-22 ENCOUNTER — Telehealth: Payer: Self-pay

## 2020-06-22 NOTE — Telephone Encounter (Signed)
Patient notified, she prefers to be schedule I HP. I arrange an appt on 03/09 at 8:40. She been notified of results.

## 2020-06-22 NOTE — Telephone Encounter (Signed)
-----   Message from Park Liter, MD sent at 06/09/2020 12:57 PM EST ----- Echocardiogram showed diminished ejection fraction 4045%.  That is worse than before.  She did have a follow-up to discuss this

## 2020-06-23 DIAGNOSIS — C3492 Malignant neoplasm of unspecified part of left bronchus or lung: Secondary | ICD-10-CM | POA: Diagnosis not present

## 2020-06-24 ENCOUNTER — Ambulatory Visit (HOSPITAL_COMMUNITY)
Admission: RE | Admit: 2020-06-24 | Discharge: 2020-06-24 | Disposition: A | Discharge status: Home / Self Care | Payer: Medicare HMO | Source: Ambulatory Visit | Attending: Internal Medicine | Admitting: Internal Medicine

## 2020-06-24 ENCOUNTER — Inpatient Hospital Stay: Payer: Medicare HMO | Attending: Internal Medicine

## 2020-06-24 ENCOUNTER — Other Ambulatory Visit: Payer: Self-pay

## 2020-06-24 DIAGNOSIS — C7931 Secondary malignant neoplasm of brain: Secondary | ICD-10-CM | POA: Diagnosis not present

## 2020-06-24 DIAGNOSIS — F32A Depression, unspecified: Secondary | ICD-10-CM | POA: Insufficient documentation

## 2020-06-24 DIAGNOSIS — F419 Anxiety disorder, unspecified: Secondary | ICD-10-CM | POA: Diagnosis not present

## 2020-06-24 DIAGNOSIS — G4733 Obstructive sleep apnea (adult) (pediatric): Secondary | ICD-10-CM | POA: Diagnosis not present

## 2020-06-24 DIAGNOSIS — E039 Hypothyroidism, unspecified: Secondary | ICD-10-CM | POA: Diagnosis not present

## 2020-06-24 DIAGNOSIS — Z79899 Other long term (current) drug therapy: Secondary | ICD-10-CM | POA: Insufficient documentation

## 2020-06-24 DIAGNOSIS — I10 Essential (primary) hypertension: Secondary | ICD-10-CM | POA: Diagnosis not present

## 2020-06-24 DIAGNOSIS — Z7901 Long term (current) use of anticoagulants: Secondary | ICD-10-CM | POA: Insufficient documentation

## 2020-06-24 DIAGNOSIS — K449 Diaphragmatic hernia without obstruction or gangrene: Secondary | ICD-10-CM | POA: Diagnosis not present

## 2020-06-24 DIAGNOSIS — C7951 Secondary malignant neoplasm of bone: Secondary | ICD-10-CM | POA: Diagnosis not present

## 2020-06-24 DIAGNOSIS — N1831 Chronic kidney disease, stage 3a: Secondary | ICD-10-CM | POA: Diagnosis not present

## 2020-06-24 DIAGNOSIS — J90 Pleural effusion, not elsewhere classified: Secondary | ICD-10-CM | POA: Diagnosis not present

## 2020-06-24 DIAGNOSIS — E785 Hyperlipidemia, unspecified: Secondary | ICD-10-CM | POA: Diagnosis not present

## 2020-06-24 DIAGNOSIS — C3432 Malignant neoplasm of lower lobe, left bronchus or lung: Secondary | ICD-10-CM | POA: Insufficient documentation

## 2020-06-24 DIAGNOSIS — C3490 Malignant neoplasm of unspecified part of unspecified bronchus or lung: Secondary | ICD-10-CM | POA: Insufficient documentation

## 2020-06-24 DIAGNOSIS — N281 Cyst of kidney, acquired: Secondary | ICD-10-CM | POA: Diagnosis not present

## 2020-06-24 DIAGNOSIS — R69 Illness, unspecified: Secondary | ICD-10-CM | POA: Diagnosis not present

## 2020-06-24 DIAGNOSIS — Z8673 Personal history of transient ischemic attack (TIA), and cerebral infarction without residual deficits: Secondary | ICD-10-CM | POA: Insufficient documentation

## 2020-06-24 DIAGNOSIS — K769 Liver disease, unspecified: Secondary | ICD-10-CM | POA: Diagnosis not present

## 2020-06-24 DIAGNOSIS — Z87891 Personal history of nicotine dependence: Secondary | ICD-10-CM | POA: Insufficient documentation

## 2020-06-24 DIAGNOSIS — Z86711 Personal history of pulmonary embolism: Secondary | ICD-10-CM | POA: Insufficient documentation

## 2020-06-24 LAB — CBC WITH DIFFERENTIAL (CANCER CENTER ONLY)
Abs Immature Granulocytes: 0.01 10*3/uL (ref 0.00–0.07)
Basophils Absolute: 0 10*3/uL (ref 0.0–0.1)
Basophils Relative: 0 %
Eosinophils Absolute: 0.1 10*3/uL (ref 0.0–0.5)
Eosinophils Relative: 1 %
HCT: 39.3 % (ref 36.0–46.0)
Hemoglobin: 12.6 g/dL (ref 12.0–15.0)
Immature Granulocytes: 0 %
Lymphocytes Relative: 15 %
Lymphs Abs: 1 10*3/uL (ref 0.7–4.0)
MCH: 30.1 pg (ref 26.0–34.0)
MCHC: 32.1 g/dL (ref 30.0–36.0)
MCV: 93.8 fL (ref 80.0–100.0)
Monocytes Absolute: 0.5 10*3/uL (ref 0.1–1.0)
Monocytes Relative: 7 %
Neutro Abs: 5.2 10*3/uL (ref 1.7–7.7)
Neutrophils Relative %: 77 %
Platelet Count: 163 10*3/uL (ref 150–400)
RBC: 4.19 MIL/uL (ref 3.87–5.11)
RDW: 15.2 % (ref 11.5–15.5)
WBC Count: 6.7 10*3/uL (ref 4.0–10.5)
nRBC: 0 % (ref 0.0–0.2)

## 2020-06-24 LAB — CMP (CANCER CENTER ONLY)
ALT: 6 U/L (ref 0–44)
AST: 11 U/L — ABNORMAL LOW (ref 15–41)
Albumin: 4 g/dL (ref 3.5–5.0)
Alkaline Phosphatase: 102 U/L (ref 38–126)
Anion gap: 13 (ref 5–15)
BUN: 23 mg/dL (ref 8–23)
CO2: 19 mmol/L — ABNORMAL LOW (ref 22–32)
Calcium: 10 mg/dL (ref 8.9–10.3)
Chloride: 107 mmol/L (ref 98–111)
Creatinine: 1.2 mg/dL — ABNORMAL HIGH (ref 0.44–1.00)
GFR, Estimated: 48 mL/min — ABNORMAL LOW (ref >60)
Glucose, Bld: 122 mg/dL — ABNORMAL HIGH (ref 70–99)
Potassium: 3.6 mmol/L (ref 3.5–5.1)
Sodium: 139 mmol/L (ref 135–145)
Total Bilirubin: 0.6 mg/dL (ref 0.3–1.2)
Total Protein: 7.3 g/dL (ref 6.5–8.1)

## 2020-06-24 MED ORDER — IOHEXOL 9 MG/ML PO SOLN
ORAL | Status: AC
  Filled 2020-06-24: qty 2000

## 2020-06-24 MED ORDER — IOHEXOL 300 MG/ML  SOLN
100.0000 mL | Freq: Once | INTRAMUSCULAR | Status: AC | PRN
  Administered 2020-06-24: 80 mL via INTRAVENOUS

## 2020-06-24 MED ORDER — IOHEXOL 300 MG/ML  SOLN: 80.0000 mL | Freq: Once | INTRAMUSCULAR | Status: DC | PRN

## 2020-06-24 MED ORDER — IOHEXOL 9 MG/ML PO SOLN
500.0000 mL | ORAL | Status: AC
  Administered 2020-06-24 (×2): 500 mL via ORAL

## 2020-06-27 ENCOUNTER — Other Ambulatory Visit: Payer: Self-pay

## 2020-06-27 ENCOUNTER — Ambulatory Visit: Payer: Medicare HMO | Admitting: Physical Therapy

## 2020-06-27 ENCOUNTER — Other Ambulatory Visit: Payer: Medicare HMO

## 2020-06-27 ENCOUNTER — Encounter: Payer: Self-pay | Admitting: Physical Therapy

## 2020-06-27 DIAGNOSIS — R2689 Other abnormalities of gait and mobility: Secondary | ICD-10-CM | POA: Diagnosis not present

## 2020-06-27 DIAGNOSIS — H8112 Benign paroxysmal vertigo, left ear: Secondary | ICD-10-CM | POA: Diagnosis not present

## 2020-06-27 DIAGNOSIS — R42 Dizziness and giddiness: Secondary | ICD-10-CM | POA: Diagnosis not present

## 2020-06-27 DIAGNOSIS — M6281 Muscle weakness (generalized): Secondary | ICD-10-CM | POA: Diagnosis not present

## 2020-06-27 DIAGNOSIS — R296 Repeated falls: Secondary | ICD-10-CM | POA: Diagnosis not present

## 2020-06-27 NOTE — Therapy (Signed)
[place] [address] [place], [place], [address] Phone: [phone]   Fax:  [phone]  Physical Therapy Treatment  Patient Details  Name: Stefanie Camacho MRN: [id number] Date of Birth: Jul 31, 1945 Referring Provider (PT): Dr. [name]   Encounter Date: 06/27/2020   PT End of Session - 06/27/20 2122    Visit Number 2    Number of Visits 17    Date for PT Re-Evaluation 08/15/20    Authorization Type [employer]    Progress Note Due on Visit 10    PT Start Time 1020    PT Stop Time 1103    PT Time Calculation (min) 43 min    Activity Tolerance Patient tolerated treatment well    Behavior During Therapy [facility] for tasks assessed/performed           Past Medical History:  Diagnosis Date  . Acute CVA (cerebrovascular accident) ([place]) 01/09/2020  . Adenocarcinoma of left lung, stage 4 ([place]) 01/18/2020  . Anxiety   . Blood clots in brain   . 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 Chronic kidney disease, stage 3a ([place]) 05/16/2020  . Chronic respiratory failure ([place]) 05/16/2020  . Cough 03/18/2020  . Daytime somnolence 05/16/2020  . Dyslipidemia   . Encounter for antineoplastic chemotherapy 01/18/2020  . Essential hypertension   . Gastro-esophageal reflux disease without esophagitis   . Generalized weakness 03/18/2020  . GERD (gastroesophageal reflux disease)   . Goals of care, counseling/discussion 01/18/2020  . Hyperlipidemia 05/16/2020  . Hypothyroidism   . Hypothyroidism, unspecified   . Insomnia 05/16/2020  . Intracranial bleeding ([place]) 01/09/2020  . Left bundle branch block 06/04/2018  . 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 Long term (current) use of anticoagulants 05/16/2020  . Lumbar foraminal stenosis 12/30/2019  . Lung mass 05/16/2020  . 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[place]) 05/16/2020  . Major depression, single episode 05/16/2020  . Malignant neoplasm of lower lobe, left  bronchus or lung ([place]) 01/14/2020  . NICM (nonischemic cardiomyopathy) ([place])   . Nonischemic cardiomyopathy ([place]) 06/04/2018   Ejection fraction 45% in summer 2019  . Nonobstructive cardiomyopathy ([place])   . Obstructive sleep apnea syndrome   . Rosacea 05/16/2020  . 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[place]) 01/28/2020  . 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[place]) 01/29/2020  . 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 Single subsegmental pulmonary embolism without acute cor pulmonale ([place]) 03/25/2020  . Sleep apnea   . Stress incontinence (female) (female) 05/16/2020  . TIA (transient ischemic attack) 01/08/2020  . Vertigo 09/25/2019    Past Surgical History:  Procedure Laterality Date  . ABDOMINAL HYSTERECTOMY    . BLADDER NECK RECONSTRUCTION    . BRONCHIAL BIOPSY  01/12/2020   Procedure: BRONCHIAL BIOPSIES;  Surgeon: Garner Nash, DO;  Location: [facility];  Service: Pulmonary;;  . BRONCHIAL BRUSHINGS  01/12/2020   Procedure: BRONCHIAL BRUSHINGS;  Surgeon: Garner Nash, DO;  Location: [facility];  Service: Pulmonary;;  . BRONCHIAL NEEDLE ASPIRATION BIOPSY  01/12/2020   Procedure: BRONCHIAL NEEDLE ASPIRATION BIOPSIES;  Surgeon: Garner Nash, DO;  Location: [facility];  Service: Pulmonary;;  . BRONCHIAL WASHINGS  01/12/2020   Procedure: BRONCHIAL WASHINGS;  Surgeon: Garner Nash, DO;  Location: [place] [facility];  Service: Pulmonary;;  . SHOULDER SURGERY    . TONSILLECTOMY    . VIDEO BRONCHOSCOPY WITH ENDOBRONCHIAL ULTRASOUND  01/12/2020   Procedure: VIDEO BRONCHOSCOPY WITH ENDOBRONCHIAL ULTRASOUND;  Surgeon: Garner Nash, DO;  Location: [facility];  Service: Pulmonary;;    There were no vitals filed for this visit.   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 No/denies                   Vestibular Assessment - 06/27/20 0001      Positional Testing   Dix-Hallpike Dix-Hallpike Left    Sidelying Test Sidelying Right;Sidelying Left      Sidelying Right   Sidelying Right Duration none    Sidelying Right Symptoms No nystagmus      Sidelying Left   Sidelying Left Duration approx 20 secs    Sidelying Left Symptoms Left nystagmus                     Vestibular Treatment/Exercise - 06/27/20 0001      Vestibular Treatment/Exercise   Vestibular Treatment Provided Canalith Repositioning    Canalith Repositioning Epley Manuever Left    Habituation Exercises Brandt Daroff       EPLEY MANUEVER LEFT   Number of Reps  4    Overall Response  Improved Symptoms     RESPONSE DETAILS LEFT persistent Lt rotary nystagmus in first position, but of shorter duration than 3rd rep (approx. 15 secs compared to 25 secs in previous rep)      Nestor Lewandowsky   Number of Reps  1    Symptom Description  mild c/o dizziness with Lt sidelying to sitting position                 PT Education - 06/27/20 2121    Education Details educated in Brandt-Daroff exercises for HEP; issued SLR and bridge exercise for Rt hip strengthening    Person(s) Educated Patient    Methods Explanation;Demonstration;Handout    Comprehension Verbalized understanding;Returned demonstration         Pt was also given info on etiology of BPPV.     PT Short Term Goals - 06/27/20 2123       PT SHORT TERM GOAL #1   Title Patient will be IND with initial HEP to improve dizziness and balance. TARGET DATE FOR ALL STGS: 07/14/20    Time 4    Period Weeks    Status New      PT SHORT TERM GOAL #2   Title Perform BERG and write goals as indicated.    Time 4    Period Weeks      PT SHORT TERM GOAL #3   Title Pt will improve gait speed with RW to >/=2.21ft/sec. to decr. falls risk.    Baseline 1.51ft/sec with RW    Time 4    Period Weeks    Status New      PT SHORT  TERM GOAL #4   Title Complete vestibular exam and write goals as indicated.    Time 4    Period Weeks    Status New      PT SHORT TERM GOAL #5   Title Pt will amb. 300' over even terrain with LRAD at MOD I level to improve funcitonal mobility.    Time 4    Period Weeks             PT Long Term Goals - 06/27/20 2123      PT LONG TERM GOAL #1   Title Pt will report no falls in the last four weeks to improve safety. TARGET DATE FOR ALL LTGS: 08/11/20    Time 8    Period Weeks    Status New      PT LONG TERM GOAL #2   Title Pt will amb. 500' over even and paved surfaces at MOD I level with LRAD to improve functional mobility.    Time 8    Period Weeks    Status New      PT LONG TERM GOAL #3   Title Pt will report dizziness during all activities is </=2/10 to improve safety and QOL.    Time 8    Period Weeks    Status New                 Plan - 06/27/20 2123    Clinical Impression Statement Pt had (+) Lt rotary upbeating nystagmus, indicative of Lt BPPV posterior canalithiasis.  4 reps of Epley were performed without complete resolution of nystagmus and dizziness - improvement noted on 4th rep as rotary nystagmus was of shorter duration, but still persistent.  Cont with treatment for Lt BPPV.    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,  Cardiomyopathy, HTN, L BBB, R PE, TIA, hypothyroidism, Kidney disease, anxiety, HLD, stress incontinence, OSA, Lx foraminal stenosis, B cataract extraction in May or June 2021-she feels like she needs long distance glasses    Examination-Activity Limitations Bed Mobility;Bend;Carry;Transfers;Locomotion Level;Squat;Lift;Toileting    Examination-Participation Restrictions Cleaning;Meal Prep;Interpersonal Relationship;Driving    Stability/Clinical Decision Making Unstable/Unpredictable    Rehab Potential Fair   based on stage 4 lung CA with mets to brain and R hip   PT Frequency 2x / week    PT Duration 8 weeks    PT Treatment/Interventions ADLs/Self Care Home Management;Biofeedback;Canalith Repostioning;DME Instruction;Gait training;Stair training;Functional mobility training;Therapeutic activities;Therapeutic exercise;Balance training;Neuromuscular re-education;Orthotic Fit/Training;Patient/family education;Joint Manipulations    PT Next Visit Plan Reassess Lt BPPV and treat prn:  Perform BERG and write goals. Begin balance HEP.    Consulted and Agree with Plan of Care Patient           Patient will benefit from skilled therapeutic intervention in order to improve the following deficits and impairments:  Abnormal gait,Decreased endurance,Decreased knowledge of use of DME,Decreased balance,Decreased mobility,Decreased strength,Dizziness,Postural dysfunction  Visit Diagnosis: BPPV (benign paroxysmal positional vertigo), left  Muscle weakness (generalized)     Problem List Patient Active Problem List   Diagnosis Date Noted  . 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 Chronic kidney disease, stage 3a ([place]) 05/16/2020  . Chronic respiratory failure ([place]) 05/16/2020  . Daytime somnolence 05/16/2020  . Hyperlipidemia 05/16/2020  . Insomnia 05/16/2020  . Long term (current) use of anticoagulants 05/16/2020  . Lung mass 05/16/2020  . Major depression in complete remission  (  [place]) 05/16/2020  . Major depression, single episode 05/16/2020  . Secondary malignant neoplasm of intrathoracic lymph nodes ([place]) 05/16/2020  . Rosacea 05/16/2020  . Stress incontinence (female) (female) 05/16/2020  . Single subsegmental pulmonary embolism without acute cor pulmonale ([place]) 03/25/2020  . Cough 03/18/2020  . Generalized weakness 03/18/2020  . 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[place]) 01/29/2020  . 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[place]) 01/28/2020  . Adenocarcinoma of left lung, stage 4 ([place]) 01/18/2020  . Encounter for antineoplastic chemotherapy 01/18/2020  . Goals of care, counseling/discussion 01/18/2020  . Malignant neoplasm of lower lobe, left bronchus or lung ([place]) 01/14/2020  . Acute CVA (cerebrovascular accident) ([place]) 01/09/2020  . Intracranial bleeding ([place]) 01/09/2020  . TIA (transient ischemic attack) 01/08/2020  . Lumbar foraminal stenosis 12/30/2019  . Vertigo 09/25/2019  . Obstructive sleep apnea syndrome   . Gastro-esophageal reflux disease without esophagitis   . Anxiety   . Nonobstructive cardiomyopathy ([place])   . Hypothyroidism, unspecified   . Nonischemic cardiomyopathy ([place]) 06/04/2018  . Essential hypertension 06/04/2018  . Left bundle branch block 06/04/2018  . Dyslipidemia 06/04/2018    Alda Lea, PT 06/27/2020, 9:32 PM  [place] [address] [place], [place], [address] Phone: [phone]   Fax:  [phone]  Name: Stefanie Camacho MRN: [id number] Date of Birth: [birth year]

## 2020-06-27 NOTE — Patient Instructions (Signed)
Bridging    Slowly raise buttocks from floor, keeping stomach tight. Repeat _10___ times per set. Do _2___ sets per session. Do _1-2___ sessions per day.  http://orth.exer.us/1097    HIP: Flexion / KNEE: Extension, Straight Leg Raise    Raise leg, keeping knee straight. Perform slowly. _10__ reps per set, _3__ sets per day, _5__ days per week      Benign Positional Vertigo Vertigo is the feeling that you or your surroundings are moving when they are not. Benign positional vertigo is the most common form of vertigo. This is usually a harmless condition (benign). This condition is positional. This means that symptoms are triggered by certain movements and positions. This condition can be dangerous if it occurs while you are doing something that could cause harm to you or others. This includes activities such as driving or operating machinery. What are the causes? The inner ear has fluid-filled canals that help your brain sense movement and balance. When the fluid moves, the brain receives messages about your body's position. With benign positional vertigo, crystals in the inner ear break free and disturb the inner ear area. This causes your brain to receive confusing messages about your body's position. What increases the risk? You are more likely to develop this condition if:  You are a woman.  You are 73 years of age or older.  You have recently had a head injury.  You have an inner ear disease. What are the signs or symptoms? Symptoms of this condition usually happen when you move your head or your eyes in different directions. Symptoms may start suddenly, and usually last for less than a minute. They include:  Loss of balance and falling.  Feeling like you are spinning or moving.  Feeling like your surroundings are spinning or moving.  Nausea and vomiting.  Blurred vision.  Dizziness.  Involuntary eye movement (nystagmus). Symptoms can be mild and cause  only minor problems, or they can be severe and interfere with daily life. Episodes of benign positional vertigo may return (recur) over time. Symptoms may improve over time. How is this diagnosed? This condition may be diagnosed based on:  Your medical history.  Physical exam of the head, neck, and ears.  Positional tests to check for or stimulate vertigo. You may be asked to turn your head and change positions, such as going from sitting to lying down. A health care provider will watch for symptoms of vertigo. You may be referred to a health care provider who specializes in ear, nose, and throat problems (ENT, or otolaryngologist) or a provider who specializes in disorders of the nervous system (neurologist). How is this treated? This condition may be treated in a session in which your health care provider moves your head in specific positions to help the displaced crystals in your inner ear move. Treatment for this condition may take several sessions. Surgery may be needed in severe cases, but this is rare. In some cases, benign positional vertigo may resolve on its own in 2-4 weeks.   Follow these instructions at home: Safety  Move slowly. Avoid sudden body or head movements or certain positions, as told by your health care provider.  Avoid driving until your health care provider says it is safe for you to do so.  Avoid operating heavy machinery until your health care provider says it is safe for you to do so.  Avoid doing any tasks that would be dangerous to you or others if vertigo occurs.  If you have trouble walking or keeping your balance, try using a cane for stability. If you feel dizzy or unstable, sit down right away.  Return to your normal activities as told by your health care provider. Ask your health care provider what activities are safe for you. General instructions  Take over-the-counter and prescription medicines only as told by your health care provider.  Drink  enough fluid to keep your urine pale yellow.  Keep all follow-up visits as told by your health care provider. This is important. Contact a health care provider if:  You have a fever.  Your condition gets worse or you develop new symptoms.  Your family or friends notice any behavioral changes.  You have nausea or vomiting that gets worse.  You have numbness or a prickling and tingling sensation. Get help right away if you:  Have difficulty speaking or moving.  Are always dizzy.  Faint.  Develop severe headaches.  Have weakness in your legs or arms.  Have changes in your hearing or vision.  Develop a stiff neck.  Develop sensitivity to light. Summary  Vertigo is the feeling that you or your surroundings are moving when they are not. Benign positional vertigo is the most common form of vertigo.  This condition is caused by crystals in the inner ear that become displaced. This causes a disturbance in an area of the inner ear that helps your brain sense movement and balance.  Symptoms include loss of balance and falling, feeling that you or your surroundings are moving, nausea and vomiting, and blurred vision.  This condition can be diagnosed based on symptoms, a physical exam, and positional tests.  Follow safety instructions as told by your health care provider. You will also be told when to contact your health care provider in case of problems. This information is not intended to replace advice given to you by your health care provider. Make sure you discuss any questions you have with your health care provider. Document Revised: 03/17/2019 Document Reviewed: 10/02/2017 Elsevier Patient Education  2021 [place] to Side-Lying    Sit on edge of bed. 1. Turn head 45 to right. 2. Maintain head position and lie down slowly on left side. Hold until symptoms subside. 3. Sit up slowly. Hold until symptoms subside. 4. Turn head 45 to left. 5. Maintain head  position and lie down slowly on right side. Hold until symptoms subside. 6. Sit up slowly. Repeat sequence _5___ times per session. Do __2-3__ sessions per day.

## 2020-06-28 ENCOUNTER — Other Ambulatory Visit: Payer: Medicare HMO

## 2020-06-28 ENCOUNTER — Other Ambulatory Visit: Payer: Self-pay

## 2020-06-28 ENCOUNTER — Inpatient Hospital Stay: Payer: Medicare HMO | Admitting: Internal Medicine

## 2020-06-28 VITALS — BP 111/65 | HR 82 | Temp 97.1°F | Resp 16 | Ht 66.0 in | Wt 156.4 lb

## 2020-06-28 DIAGNOSIS — C3432 Malignant neoplasm of lower lobe, left bronchus or lung: Secondary | ICD-10-CM | POA: Diagnosis not present

## 2020-06-28 DIAGNOSIS — E039 Hypothyroidism, unspecified: Secondary | ICD-10-CM | POA: Diagnosis not present

## 2020-06-28 DIAGNOSIS — C7932 Secondary malignant neoplasm of cerebral meninges: Secondary | ICD-10-CM | POA: Diagnosis not present

## 2020-06-28 DIAGNOSIS — C7951 Secondary malignant neoplasm of bone: Secondary | ICD-10-CM | POA: Diagnosis not present

## 2020-06-28 DIAGNOSIS — C7949 Secondary malignant neoplasm of other parts of nervous system: Secondary | ICD-10-CM

## 2020-06-28 DIAGNOSIS — Z5111 Encounter for antineoplastic chemotherapy: Secondary | ICD-10-CM | POA: Diagnosis not present

## 2020-06-28 DIAGNOSIS — C3492 Malignant neoplasm of unspecified part of left bronchus or lung: Secondary | ICD-10-CM | POA: Diagnosis not present

## 2020-06-28 DIAGNOSIS — E785 Hyperlipidemia, unspecified: Secondary | ICD-10-CM | POA: Diagnosis not present

## 2020-06-28 DIAGNOSIS — R69 Illness, unspecified: Secondary | ICD-10-CM | POA: Diagnosis not present

## 2020-06-28 DIAGNOSIS — Z87891 Personal history of nicotine dependence: Secondary | ICD-10-CM | POA: Diagnosis not present

## 2020-06-28 DIAGNOSIS — C7931 Secondary malignant neoplasm of brain: Secondary | ICD-10-CM | POA: Diagnosis not present

## 2020-06-28 DIAGNOSIS — G4733 Obstructive sleep apnea (adult) (pediatric): Secondary | ICD-10-CM | POA: Diagnosis not present

## 2020-06-28 DIAGNOSIS — N1831 Chronic kidney disease, stage 3a: Secondary | ICD-10-CM | POA: Diagnosis not present

## 2020-06-28 NOTE — Progress Notes (Signed)
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 2) pulmonary embolus in the right main pulmonary artery diagnosed in November 2021.  Biomarker Findings Microsatellite status - MS-Stable Tumor Mutational Burden - 1 Muts/Mb Genomic Findings For a complete list of the genes assayed, please refer to the Appendix. EGFR exon 19 deletion ([id number]) 7 Disease relevant genes with no reportable alterations: ALK, BRAF, ERBB2, KRAS, MET, RET, ROS1  PDL1 expression: 2%.  PRIOR THERAPY: Whole brain radiation and radiotherapy to the osseous metastases under the care of Dr. Lisbeth Renshaw. Last treatment expected on 02/02/20  CURRENT THERAPY:  1) Tagrisso160 mg p.o. daily. First dose on9/27/21. Status post 5 months of treatment. 2) Eliquis 5 mg p.o. twice daily  INTERVAL HISTORY: Stefanie Camacho 75 y.o. female returns to the clinic today for follow-up visit.  She was accompanied by her sister.  The patient is feeling fine today with no concerning complaints she has more energy and she walks around.  She denied having any significant chest pain, shortness of breath, cough or hemoptysis.  She denied having any fever or chills.  She has no nausea, vomiting, diarrhea or constipation.  She has no headache or visual changes.  She was seen by her cardiologist recently and she has 2D echo that showed mild decrease in the ejection fraction compared to the previous one.  The patient continues to tolerate her treatment with Tagrisso fairly well.  She is here today  for evaluation with repeat CT scan of the chest, abdomen pelvis for restaging of her disease.  MEDICAL HISTORY: Past Medical History:  Diagnosis Date  . Acute CVA (cerebrovascular accident) ([place]) 01/09/2020  . Adenocarcinoma of left lung, stage 4 ([place]) 01/18/2020  . Anxiety   . Blood clots in brain   . 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 Chronic kidney disease, stage 3a ([place]) 05/16/2020  . Chronic respiratory failure ([place]) 05/16/2020  . Cough 03/18/2020  . Daytime somnolence 05/16/2020  . Dyslipidemia   . Encounter for antineoplastic chemotherapy 01/18/2020  . Essential hypertension   . Gastro-esophageal reflux disease without esophagitis   . Generalized weakness 03/18/2020  . GERD (gastroesophageal reflux disease)   . Goals of care, counseling/discussion 01/18/2020  . Hyperlipidemia 05/16/2020  . Hypothyroidism   . Hypothyroidism, unspecified   . Insomnia 05/16/2020  . Intracranial bleeding ([place]) 01/09/2020  . Left bundle branch block 06/04/2018  . 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 Long term (current) use of anticoagulants 05/16/2020  . Lumbar foraminal stenosis 12/30/2019  . Lung mass 05/16/2020  . 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[place]) 05/16/2020  . Major depression, single episode 05/16/2020  . Malignant neoplasm of lower lobe, left bronchus or lung ([place]) 01/14/2020  . NICM (nonischemic cardiomyopathy) ([place])   . Nonischemic cardiomyopathy ([place]) 06/04/2018   Ejection fraction 45% in summer 2019  . Nonobstructive cardiomyopathy ([place])   . Obstructive sleep apnea syndrome   . Rosacea 05/16/2020  . 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[place]) 01/28/2020  . 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 Single subsegmental pulmonary embolism without acute cor pulmonale ([place]) 03/25/2020  . Sleep apnea   . Stress incontinence (female) (female) 05/16/2020  . TIA  (transient ischemic attack) 01/08/2020  . Vertigo 09/25/2019    ALLERGIES:  is allergic to sertraline hcl.  MEDICATIONS:  Current Outpatient Medications  Medication Sig Dispense Refill  . acetaminophen (TYLENOL) 500 MG tablet Take 1,000 mg by mouth every 6 (six) hours as needed for mild pain.    Marland Kitchen apixaban (ELIQUIS) 5 MG TABS tablet Take 1 tablet (5 mg total) by mouth 2 (two) times daily. 90 tablet 1  . atorvastatin (LIPITOR) 10 MG tablet Take 1 tablet (10 mg total) by mouth daily at 6 PM. 90 tablet 3  . cephALEXin (KEFLEX) 500 MG capsule Take 500 mg by mouth in the morning and at bedtime. (Patient not taking: Reported on 06/16/2020)    . FLUoxetine (PROZAC) 20 MG capsule Take 20 mg by mouth daily.    Marland Kitchen levETIRAcetam (KEPPRA) 500 MG tablet Take 1 tablet (500 mg total) by mouth 2 (two) times daily. 60 tablet 1  . levothyroxine (SYNTHROID) 75 MCG tablet Take 1 tablet (75 mcg total) by mouth daily. 30 tablet 1  . loperamide (IMODIUM) 2 MG capsule Take 2 mg by mouth as needed for diarrhea or loose stools.    Marland Kitchen losartan (COZAAR) 25 MG tablet Take 1 tablet (25 mg total) by mouth daily. 90 tablet 3  . metoprolol succinate (TOPROL-XL) 50 MG 24 hr tablet Take 1 tablet (50 mg total) by mouth daily. Take with or immediately following a meal. 90 tablet 2  . osimertinib mesylate (TAGRISSO) 80 MG tablet Take 2 tablets (160 mg total) by mouth daily. Days 1-28. 60 tablet 0  . pantoprazole (PROTONIX) 40 MG tablet Take 1 tablet by mouth once daily 30 tablet 0  . prochlorperazine (COMPAZINE) 10 MG tablet Take 1 tablet (10 mg total) by mouth every 6 (six) hours as needed for nausea or vomiting. 45 tablet 5  . spironolactone (ALDACTONE) 25 MG tablet Take 0.5 tablets (12.5 mg total) by mouth daily. 45 tablet 3   No current facility-administered medications for this visit.    SURGICAL HISTORY:  Past Surgical History:  Procedure Laterality Date  . ABDOMINAL HYSTERECTOMY    . BLADDER NECK RECONSTRUCTION    .  BRONCHIAL BIOPSY  01/12/2020   Procedure: BRONCHIAL BIOPSIES;  Surgeon: Garner Nash, DO;  Location: [facility];  Service: Pulmonary;;  . BRONCHIAL BRUSHINGS  01/12/2020   Procedure: BRONCHIAL BRUSHINGS;  Surgeon: Garner Nash, DO;  Location: [facility];  Service: Pulmonary;;  . BRONCHIAL NEEDLE ASPIRATION BIOPSY  01/12/2020   Procedure: BRONCHIAL NEEDLE ASPIRATION BIOPSIES;  Surgeon: Garner Nash, DO;  Location: [facility];  Service: Pulmonary;;  . BRONCHIAL WASHINGS  01/12/2020   Procedure: BRONCHIAL WASHINGS;  Surgeon: Garner Nash, DO;  Location: [facility];  Service: Pulmonary;;  . SHOULDER SURGERY    . TONSILLECTOMY    . VIDEO BRONCHOSCOPY WITH ENDOBRONCHIAL ULTRASOUND  01/12/2020   Procedure: VIDEO BRONCHOSCOPY WITH ENDOBRONCHIAL ULTRASOUND;  Surgeon: Garner Nash, DO;  Location: [facility];  Service: Pulmonary;;    REVIEW OF SYSTEMS:  Constitutional: positive for fatigue Eyes: negative Ears, nose, mouth, throat, and face: negative Respiratory: negative Cardiovascular: negative Gastrointestinal: negative Genitourinary:negative Integument/breast: negative Hematologic/lymphatic: negative Musculoskeletal:positive for muscle weakness Neurological: negative Behavioral/Psych: negative Endocrine: negative Allergic/Immunologic: negative   PHYSICAL EXAMINATION: General appearance: alert, cooperative, fatigued and no distress Head:  Normocephalic, without obvious abnormality, atraumatic Neck: no adenopathy, no JVD, supple, symmetrical, trachea midline and thyroid not enlarged, symmetric, no tenderness/mass/nodules Lymph nodes: Cervical, supraclavicular, and axillary nodes normal. Resp: clear to auscultation bilaterally Back: symmetric, no curvature. ROM normal. No CVA tenderness. Cardio: regular rate and rhythm, S1, S2 normal, no murmur, click, rub or gallop GI: soft, non-tender; bowel sounds normal; no masses,  no organomegaly Extremities: extremities normal,  atraumatic, no cyanosis or edema Neurologic: Alert and oriented X 3, normal strength and tone. Normal symmetric reflexes. Normal coordination and gait  ECOG PERFORMANCE STATUS: 1 - Symptomatic but completely ambulatory  Blood pressure 111/65, pulse 82, temperature (!) 97.1 F (36.2 C), temperature source Tympanic, resp. rate 16, height '5\' 6"'  (1.676 m), weight 156 lb 6.4 oz (70.9 kg), SpO2 98 %.  LABORATORY DATA: Lab Results  Component Value Date   WBC 6.7 06/24/2020   HGB 12.6 06/24/2020   HCT 39.3 06/24/2020   MCV 93.8 06/24/2020   PLT 163 06/24/2020      Chemistry      Component Value Date/Time   NA 139 06/24/2020 1327   K 3.6 06/24/2020 1327   CL 107 06/24/2020 1327   CO2 19 (L) 06/24/2020 1327   BUN 23 06/24/2020 1327   CREATININE 1.20 (H) 06/24/2020 1327      Component Value Date/Time   CALCIUM 10.0 06/24/2020 1327   ALKPHOS 102 06/24/2020 1327   AST 11 (L) 06/24/2020 1327   ALT 6 06/24/2020 1327   BILITOT 0.6 06/24/2020 1327       RADIOGRAPHIC STUDIES: CT Chest W Contrast  Result Date: 06/25/2020 CLINICAL DATA:  Non-small-cell lung cancer.  Restaging. EXAM: CT CHEST, ABDOMEN, AND PELVIS WITH CONTRAST TECHNIQUE: Multidetector CT imaging of the chest, abdomen and pelvis was performed following the standard protocol during bolus administration of intravenous contrast. CONTRAST:  20m OMNIPAQUE IOHEXOL 300 MG/ML  SOLN COMPARISON:  03/25/2020 FINDINGS: CT CHEST FINDINGS Cardiovascular: The heart size is normal. No substantial pericardial effusion. Coronary artery calcification is evident. No thoracic aortic aneurysm. Mediastinum/Nodes: No mediastinal lymphadenopathy. Collapse/consolidative opacity in the left hilar region is presumably treatment related. No right hilar lymphadenopathy. The esophagus has normal imaging features. There is no axillary lymphadenopathy. Lungs/Pleura: The diffuse ground-glass nodularity seen in both lungs previously persists, but has decreased.  Index 6 mm ground-glass nodule left upper lobe on 44/4 was 10 mm previously (remeasured). 7 mm right upper lobe nodule on 28/4 was 2 cm previously (remeasured). Collapse/consolidative opacity in the parahilar left upper and lower lobes is more prominent. There is a small to moderate left pleural effusion today. Musculoskeletal: Multiple sclerotic bone metastases again noted, similar to prior. 7 mm lesion in the T12 vertebral body is stable (118/4). CT ABDOMEN PELVIS FINDINGS Hepatobiliary: Subcapsular cavernous hemangioma in the right liver is stable. 7 mm subcapsular hypoattenuating lesion in the right liver on 57/2 and 8 mm inferior right liver lesion on 58/2 are similar to prior. There is no evidence for gallstones, gallbladder wall thickening, or pericholecystic fluid. No intrahepatic or extrahepatic biliary dilation. Pancreas: No focal mass lesion. No dilatation of the main duct. No intraparenchymal cyst. No peripancreatic edema. Spleen: No splenomegaly. No focal mass lesion. Adrenals/Urinary Tract: No adrenal nodule or mass. Small cyst noted lower pole right kidney. Left kidney unremarkable. No evidence for hydroureter. The urinary bladder appears normal for the degree of distention. Stomach/Bowel: Tiny hiatal hernia. Stomach otherwise unremarkable. Duodenum is normally positioned as is the ligament of Treitz. No small bowel wall  thickening. No small bowel dilatation. The terminal ileum is normal. The appendix is normal. No gross colonic mass. No colonic wall thickening. Vascular/Lymphatic: There is abdominal aortic atherosclerosis without aneurysm. There is no gastrohepatic or hepatoduodenal ligament lymphadenopathy. No retroperitoneal or mesenteric lymphadenopathy. No pelvic sidewall lymphadenopathy. Reproductive: The uterus is surgically absent. There is no adnexal mass. Other: No intraperitoneal free fluid. Musculoskeletal: Sclerotic bone metastases again noted, similar to prior. Posterior left iliac  lesion measuring 11 mm on image 86/2 was 11 mm previously (remeasured). IMPRESSION: 1. The diffuse ground-glass nodularity seen in both lungs previously persists but has decreased in the interval. There is a small to moderate left pleural effusion today. 2. Collapse/consolidative opacity in the parahilar left upper and lower lobes is more prominent today. This may be treatment related, but close attention on follow-up recommended as metastatic disease not excluded. 3. Similar appearance of sclerotic bone metastases. 4. Stable appearance of small low-density liver lesions. Attention on follow-up recommended. 5. Tiny hiatal hernia. 6. Aortic Atherosclerosis (ICD10-I70.0). Electronically Signed   By: Misty Stanley M.D.   On: 06/25/2020 12:56   CT Abdomen Pelvis W Contrast  Result Date: 06/25/2020 CLINICAL DATA:  Non-small-cell lung cancer.  Restaging. EXAM: CT CHEST, ABDOMEN, AND PELVIS WITH CONTRAST TECHNIQUE: Multidetector CT imaging of the chest, abdomen and pelvis was performed following the standard protocol during bolus administration of intravenous contrast. CONTRAST:  76m OMNIPAQUE IOHEXOL 300 MG/ML  SOLN COMPARISON:  03/25/2020 FINDINGS: CT CHEST FINDINGS Cardiovascular: The heart size is normal. No substantial pericardial effusion. Coronary artery calcification is evident. No thoracic aortic aneurysm. Mediastinum/Nodes: No mediastinal lymphadenopathy. Collapse/consolidative opacity in the left hilar region is presumably treatment related. No right hilar lymphadenopathy. The esophagus has normal imaging features. There is no axillary lymphadenopathy. Lungs/Pleura: The diffuse ground-glass nodularity seen in both lungs previously persists, but has decreased. Index 6 mm ground-glass nodule left upper lobe on 44/4 was 10 mm previously (remeasured). 7 mm right upper lobe nodule on 28/4 was 2 cm previously (remeasured). Collapse/consolidative opacity in the parahilar left upper and lower lobes is more  prominent. There is a small to moderate left pleural effusion today. Musculoskeletal: Multiple sclerotic bone metastases again noted, similar to prior. 7 mm lesion in the T12 vertebral body is stable (118/4). CT ABDOMEN PELVIS FINDINGS Hepatobiliary: Subcapsular cavernous hemangioma in the right liver is stable. 7 mm subcapsular hypoattenuating lesion in the right liver on 57/2 and 8 mm inferior right liver lesion on 58/2 are similar to prior. There is no evidence for gallstones, gallbladder wall thickening, or pericholecystic fluid. No intrahepatic or extrahepatic biliary dilation. Pancreas: No focal mass lesion. No dilatation of the main duct. No intraparenchymal cyst. No peripancreatic edema. Spleen: No splenomegaly. No focal mass lesion. Adrenals/Urinary Tract: No adrenal nodule or mass. Small cyst noted lower pole right kidney. Left kidney unremarkable. No evidence for hydroureter. The urinary bladder appears normal for the degree of distention. Stomach/Bowel: Tiny hiatal hernia. Stomach otherwise unremarkable. Duodenum is normally positioned as is the ligament of Treitz. No small bowel wall thickening. No small bowel dilatation. The terminal ileum is normal. The appendix is normal. No gross colonic mass. No colonic wall thickening. Vascular/Lymphatic: There is abdominal aortic atherosclerosis without aneurysm. There is no gastrohepatic or hepatoduodenal ligament lymphadenopathy. No retroperitoneal or mesenteric lymphadenopathy. No pelvic sidewall lymphadenopathy. Reproductive: The uterus is surgically absent. There is no adnexal mass. Other: No intraperitoneal free fluid. Musculoskeletal: Sclerotic bone metastases again noted, similar to prior. Posterior left iliac lesion measuring 11 mm  on image 86/2 was 11 mm previously (remeasured). IMPRESSION: 1. The diffuse ground-glass nodularity seen in both lungs previously persists but has decreased in the interval. There is a small to moderate left pleural effusion  today. 2. Collapse/consolidative opacity in the parahilar left upper and lower lobes is more prominent today. This may be treatment related, but close attention on follow-up recommended as metastatic disease not excluded. 3. Similar appearance of sclerotic bone metastases. 4. Stable appearance of small low-density liver lesions. Attention on follow-up recommended. 5. Tiny hiatal hernia. 6. Aortic Atherosclerosis (ICD10-I70.0). Electronically Signed   By: Misty Stanley M.D.   On: 06/25/2020 12:56   ECHOCARDIOGRAM COMPLETE  Result Date: 06/08/2020    ECHOCARDIOGRAM REPORT   Patient Name:   Stefanie Camacho Date of Exam: 06/06/2020 Medical Rec #:  [id number]     Height:       66.0 in Accession #:    [id number]    Weight:       166.5 lb Date of Birth:  11-18-1945     BSA:          1.850 m Patient Age:    46 years      BP:           100/60 mmHg Patient Gender: F             HR:           80 bpm. Exam Location:  [facility] Procedure: 2D Echo, 3D Echo, Cardiac Doppler, Color Doppler and Strain Analysis Indications:    R06.9 DOE; R06.02 SOB  History:        Patient has prior history of Echocardiogram examinations, most                 recent 03/25/2020. Cardiomyopathy, Stroke and Brain/Lung/Bone                 CA, Arrythmias:LBBB, Signs/Symptoms:Shortness of Breath, Risk                 Factors:Hypertension, Dyslipidemia and Former Smoker.;                 Medications:Chemo.  Sonographer:    Geradine Girt Referring Phys: [phone] [place]  Sonographer Comments: Global longitudinal strain was attempted. IMPRESSIONS  1. Left ventricular ejection fraction, by estimation, is 40 to 45%. The left ventricle has mildly decreased function. The left ventricle demonstrates global hypokinesis. Left ventricular diastolic parameters are consistent with Grade I diastolic dysfunction (impaired relaxation).  2. Right ventricular systolic function is mildly reduced. The right ventricular size is normal. There is normal pulmonary artery  systolic pressure.  3. The mitral valve is normal in structure. Mild to moderate mitral valve regurgitation. No evidence of mitral stenosis.  4. The aortic valve is tricuspid. Aortic valve regurgitation is trivial. No aortic stenosis is present.  5. The inferior vena cava is normal in size with greater than 50% respiratory variability, suggesting right atrial pressure of 3 mmHg. FINDINGS  Left Ventricle: Left ventricular ejection fraction, by estimation, is 40 to 45%. The left ventricle has mildly decreased function. The left ventricle demonstrates global hypokinesis. The left ventricular internal cavity size was normal in size. There is  no left ventricular hypertrophy. Left ventricular diastolic parameters are consistent with Grade I diastolic dysfunction (impaired relaxation). Normal left ventricular filling pressure. Right Ventricle: The right ventricular size is normal. No increase in right ventricular wall thickness. Right ventricular systolic function is mildly reduced. There is normal pulmonary artery  systolic pressure. The tricuspid regurgitant velocity is 2.25 m/s, and with an assumed right atrial pressure of 3 mmHg, the estimated right ventricular systolic pressure is 69.4 mmHg. Left Atrium: Left atrial size was normal in size. Right Atrium: Right atrial size was normal in size. Pericardium: There is no evidence of pericardial effusion. Mitral Valve: The mitral valve is normal in structure. There is mild thickening of the mitral valve leaflet(s). Mild to moderate mitral valve regurgitation. No evidence of mitral valve stenosis. Tricuspid Valve: The tricuspid valve is normal in structure. Tricuspid valve regurgitation is mild . No evidence of tricuspid stenosis. Aortic Valve: The aortic valve is tricuspid. Aortic valve regurgitation is trivial. No aortic stenosis is present. Pulmonic Valve: The pulmonic valve was normal in structure. Pulmonic valve regurgitation is not visualized. No evidence of pulmonic  stenosis. Aorta: The aortic root and ascending aorta are structurally normal, with no evidence of dilitation and the ascending aorta was not well visualized. Venous: The pulmonary veins were not well visualized. The inferior vena cava is normal in size with greater than 50% respiratory variability, suggesting right atrial pressure of 3 mmHg. IAS/Shunts: No atrial level shunt detected by color flow Doppler. Additional Comments: There is pleural effusion in the left lateral region.  LEFT VENTRICLE PLAX 2D LVIDd:         4.92 cm  Diastology LVIDs:         3.67 cm  LV e' medial:    4.35 cm/s LV PW:         0.81 cm  LV E/e' medial:  8.3 LV IVS:        0.93 cm  LV e' lateral:   7.29 cm/s LVOT diam:     1.90 cm  LV E/e' lateral: 4.9 LV SV:         41 LV SV Index:   22 LVOT Area:     2.84 cm                          3D Volume EF:                         3D EF:        53 %                         LV EDV:       140 ml                         LV ESV:       65 ml                         LV SV:        74 ml RIGHT VENTRICLE RV S prime:     12.70 cm/s TAPSE (M-mode): 1.6 cm LEFT ATRIUM             Index       RIGHT ATRIUM          Index LA diam:        3.70 cm 2.00 cm/m  RA Area:     9.37 cm LA Vol (A2C):   35.4 ml 19.14 ml/m RA Volume:   16.50 ml 8.92 ml/m LA Vol (A4C):   19.6 ml 10.60 ml/m LA Biplane Vol: 27.8 ml 15.03 ml/m  AORTIC VALVE  LVOT Vmax:   80.90 cm/s LVOT Vmean:  58.000 cm/s LVOT VTI:    0.143 m  AORTA Ao Root diam: 2.90 cm Ao Asc diam:  3.30 cm MITRAL VALVE               TRICUSPID VALVE MV Area (PHT): 2.39 cm    TR Peak grad:   20.2 mmHg MV Decel Time: 317 msec    TR Vmax:        225.00 cm/s MR Peak grad: 158.3 mmHg MR Vmax:      629.00 cm/s  SHUNTS MV E velocity: 36.00 cm/s  Systemic VTI:  0.14 m MV A velocity: 72.80 cm/s  Systemic Diam: 1.90 cm MV E/A ratio:  0.49 Shirlee More MD Electronically signed by Shirlee More MD Signature Date/Time: 06/08/2020/5:23:23 PM    Final     ASSESSMENT AND PLAN: This is a  very pleasant 75 years old white female recently diagnosed with a stage IV non-small cell lung cancer, adenocarcinoma in September 2021 and presented with left lower lobe lung mass in addition to extensive bilateral pulmonary metastasis and anterior mediastinal lymphadenopathy as well as a skeletal metastasis and metastatic disease to the brain with leptomeningeal disease.  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 She underwent whole brain irradiation and currently undergoing treatment with targeted therapy with Tagrisso 160 mg p.o. daily because of the leptomeningeal disease status post 5 months of treatment.  She continues to tolerate her treatment with Tagrisso fairly well. She had repeat CT scan of the chest, abdomen pelvis performed recently.  I personally and independently reviewed the scan images and discussed the results with the patient and her sister. Her scan showed no concerning findings for disease progression. I recommended for her to continue her current treatment with Tagrisso with the same dose. Regarding the left pleural effusion, we will continue to monitor for now and consider the patient for ultrasound-guided thoracentesis if it increased in size. For the leptomeningeal disease, she will continue her monitoring with repeat MRI of the brain under the care of Dr. Mickeal Skinner. For the history of pulmonary embolism she is currently on Eliquis. For the history of depression she will continue her current treatment with Prozac. The patient will come back for follow-up visit in 1 months for evaluation and repeat blood work. She was advised to call immediately if she has any concerning symptoms in the interval. The patient voices understanding of current disease status and treatment options and is in agreement with the current care plan.  All questions were answered. The patient knows to call the clinic with any problems, questions  or concerns. We can certainly see the patient much sooner if necessary.  Disclaimer: This note was dictated with voice recognition software. Similar sounding words can inadvertently be transcribed and may not be corrected upon review.

## 2020-06-29 ENCOUNTER — Telehealth: Payer: Self-pay | Admitting: Internal Medicine

## 2020-06-29 NOTE — Telephone Encounter (Signed)
Scheduled appointments per 2/22 los. Spoke to patient who is aware of appointments date and times.

## 2020-06-30 ENCOUNTER — Ambulatory Visit: Payer: Medicare HMO

## 2020-07-02 ENCOUNTER — Other Ambulatory Visit: Payer: Self-pay | Admitting: Internal Medicine

## 2020-07-04 ENCOUNTER — Telehealth: Payer: Self-pay

## 2020-07-04 ENCOUNTER — Other Ambulatory Visit: Payer: Self-pay | Admitting: Internal Medicine

## 2020-07-04 ENCOUNTER — Ambulatory Visit: Payer: Medicare HMO | Admitting: Physical Therapy

## 2020-07-04 NOTE — Telephone Encounter (Signed)
Pt called wanting to cancel her O2. She states her saturation remains between 94-96% depending if she is walking or not.  I have faxed a signed d/c request to [facility] and confirmation was received.

## 2020-07-05 ENCOUNTER — Telehealth: Payer: Self-pay | Admitting: Internal Medicine

## 2020-07-05 NOTE — Telephone Encounter (Signed)
Contacted patient about changes to her appointment per provider schedule. Patient is aware.

## 2020-07-06 ENCOUNTER — Ambulatory Visit: Payer: Medicare HMO | Attending: Family Medicine | Admitting: Physical Therapy

## 2020-07-06 ENCOUNTER — Other Ambulatory Visit: Payer: Self-pay

## 2020-07-06 VITALS — BP 96/71

## 2020-07-06 DIAGNOSIS — R2689 Other abnormalities of gait and mobility: Secondary | ICD-10-CM | POA: Insufficient documentation

## 2020-07-06 DIAGNOSIS — M6281 Muscle weakness (generalized): Secondary | ICD-10-CM | POA: Diagnosis not present

## 2020-07-06 DIAGNOSIS — H8112 Benign paroxysmal vertigo, left ear: Secondary | ICD-10-CM | POA: Diagnosis not present

## 2020-07-06 DIAGNOSIS — R42 Dizziness and giddiness: Secondary | ICD-10-CM | POA: Diagnosis not present

## 2020-07-07 ENCOUNTER — Ambulatory Visit: Payer: Medicare HMO | Admitting: Physical Therapy

## 2020-07-07 VITALS — BP 99/63 | HR 86

## 2020-07-07 DIAGNOSIS — M6281 Muscle weakness (generalized): Secondary | ICD-10-CM | POA: Diagnosis not present

## 2020-07-07 DIAGNOSIS — R42 Dizziness and giddiness: Secondary | ICD-10-CM | POA: Diagnosis not present

## 2020-07-07 DIAGNOSIS — H8112 Benign paroxysmal vertigo, left ear: Secondary | ICD-10-CM

## 2020-07-07 DIAGNOSIS — R2689 Other abnormalities of gait and mobility: Secondary | ICD-10-CM | POA: Diagnosis not present

## 2020-07-07 NOTE — Therapy (Signed)
[facility] [address] [place], [place], [address] Phone: [phone]   Fax:  [phone]  Physical Therapy Treatment  Patient Details  Name: Stefanie Camacho MRN: [id number] Date of Birth: [birth year] Referring Provider (PT): Dr. [name]   Encounter Date: 07/06/2020   PT End of Session - 07/07/20 1923    Visit Number 3    Number of Visits 17    Date for PT Re-Evaluation 08/15/20    Authorization Type [employer] Medicare    Progress Note Due on Visit 10    PT Start Time 1023    PT Stop Time 1100    PT Time Calculation (min) 37 min    Activity Tolerance Patient tolerated treatment well    Behavior During Therapy [facility] for tasks assessed/performed           Past Medical History:  Diagnosis Date  . Acute CVA (cerebrovascular accident) ([place]) 01/09/2020  . Adenocarcinoma of left lung, stage 4 ([place]) 01/18/2020  . Anxiety   . Blood clots in brain   . 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 Chronic kidney disease, stage 3a ([place]) 05/16/2020  . Chronic respiratory failure ([place]) 05/16/2020  . Cough 03/18/2020  . Daytime somnolence 05/16/2020  . Dyslipidemia   . Encounter for antineoplastic chemotherapy 01/18/2020  . Essential hypertension   . Gastro-esophageal reflux disease without esophagitis   . Generalized weakness 03/18/2020  . GERD (gastroesophageal reflux disease)   . Goals of care, counseling/discussion 01/18/2020  . Hyperlipidemia 05/16/2020  . Hypothyroidism   . Hypothyroidism, unspecified   . Insomnia 05/16/2020  . Intracranial bleeding ([place]) 01/09/2020  . Left bundle branch block 06/04/2018  . 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 Long term (current) use of anticoagulants 05/16/2020  . Lumbar foraminal stenosis 12/30/2019  . Lung mass 05/16/2020  . 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[place]) 05/16/2020  . Major depression, single episode 05/16/2020  . Malignant neoplasm of lower lobe, left  bronchus or lung ([place]) 01/14/2020  . NICM (nonischemic cardiomyopathy) ([place])   . Nonischemic cardiomyopathy ([place]) 06/04/2018   Ejection fraction 45% in summer 2019  . Nonobstructive cardiomyopathy ([place])   . Obstructive sleep apnea syndrome   . Rosacea 05/16/2020  . 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[place]) 01/28/2020  . 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[place]) 01/29/2020  . 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 Single subsegmental pulmonary embolism without acute cor pulmonale ([place]) 03/25/2020  . Sleep apnea   . Stress incontinence (female) (female) 05/16/2020  . TIA (transient ischemic attack) 01/08/2020  . Vertigo 09/25/2019    Past Surgical History:  Procedure Laterality Date  . ABDOMINAL HYSTERECTOMY    . BLADDER NECK RECONSTRUCTION    . BRONCHIAL BIOPSY  01/12/2020   Procedure: BRONCHIAL BIOPSIES;  Surgeon: Garner Nash, DO;  Location: [facility];  Service: Pulmonary;;  . BRONCHIAL BRUSHINGS  01/12/2020   Procedure: BRONCHIAL BRUSHINGS;  Surgeon: Garner Nash, DO;  Location: [facility];  Service: Pulmonary;;  . BRONCHIAL NEEDLE ASPIRATION BIOPSY  01/12/2020   Procedure: BRONCHIAL NEEDLE ASPIRATION BIOPSIES;  Surgeon: Garner Nash, DO;  Location: [facility];  Service: Pulmonary;;  . BRONCHIAL WASHINGS  01/12/2020   Procedure: BRONCHIAL WASHINGS;  Surgeon: Garner Nash, DO;  Location: [facility];  Service: Pulmonary;;  . SHOULDER SURGERY    . TONSILLECTOMY    . VIDEO BRONCHOSCOPY WITH ENDOBRONCHIAL ULTRASOUND  01/12/2020   Procedure: VIDEO BRONCHOSCOPY WITH ENDOBRONCHIAL ULTRASOUND;  Surgeon: Garner Nash, DO;  Location: [facility];  Service: Pulmonary;;    Vitals:   07/06/20 1034 07/06/20 1037 07/06/20 1038 07/06/20 1057  BP: 92/65 (!) 93/59 (!) 75/49 96/71                         Vestibular Treatment/Exercise - 07/07/20 0001      Vestibular Treatment/Exercise   Vestibular Treatment  Provided Canalith Repositioning    Canalith Repositioning Epley Manuever Left       EPLEY MANUEVER LEFT   Number of Reps  3    Overall Response  Improved Symptoms     RESPONSE DETAILS LEFT decreased Lt rotary upbeating nystagmus in initial postion noted on 3rd rep                   PT Short Term Goals - 07/07/20 1931      PT SHORT TERM GOAL #1   Title Patient will be IND with initial HEP to improve dizziness and balance. TARGET DATE FOR ALL STGS: 07/14/20    Time 4    Period Weeks    Status New      PT SHORT TERM GOAL #2   Title Perform BERG and write goals as indicated.    Time 4    Period Weeks      PT SHORT TERM GOAL #3   Title Pt will improve gait speed with RW to >/=2.75ft/sec. to decr. falls risk.    Baseline 1.34ft/sec with RW    Time 4    Period Weeks    Status New      PT SHORT TERM GOAL #4   Title Complete vestibular exam and write goals as indicated.    Time 4    Period Weeks    Status New      PT SHORT TERM GOAL #5   Title Pt will amb. 300' over even terrain with LRAD at MOD I level to improve funcitonal mobility.    Time 4    Period Weeks             PT Long Term Goals - 07/07/20 1931      PT LONG TERM GOAL #1   Title Pt will report no falls in the last four weeks to improve safety. TARGET DATE FOR ALL LTGS: 08/11/20    Time 8    Period Weeks    Status New      PT LONG TERM GOAL #2   Title Pt will amb. 500' over even and paved surfaces at MOD I level with LRAD to improve functional mobility.    Time 8    Period Weeks    Status New      PT LONG TERM GOAL #3   Title Pt will report dizziness during all activities is </=2/10 to improve safety and QOL.    Time 8    Period Weeks    Status New                  Patient will benefit from skilled therapeutic intervention in order to improve the following deficits and impairments:  Abnormal gait,Decreased endurance,Decreased knowledge of use of DME,Decreased balance,Decreased  mobility,Decreased strength,Dizziness,Postural dysfunction  Visit Diagnosis: BPPV (benign paroxysmal positional vertigo), left     Problem List Patient Active Problem List   Diagnosis Date Noted  . 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 Chronic kidney disease, stage 3a ([place]) 05/16/2020  .  Chronic respiratory failure ([place]) 05/16/2020  . Daytime somnolence 05/16/2020  . Hyperlipidemia 05/16/2020  . Insomnia 05/16/2020  . Long term (current) use of anticoagulants 05/16/2020  . Lung mass 05/16/2020  . 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[place]) 05/16/2020  . Major depression, single episode 05/16/2020  . Secondary malignant neoplasm of intrathoracic lymph nodes ([place]) 05/16/2020  . Rosacea 05/16/2020  . Stress incontinence (female) (female) 05/16/2020  . Single subsegmental pulmonary embolism without acute cor pulmonale ([place]) 03/25/2020  . Cough 03/18/2020  . Generalized weakness 03/18/2020  . 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[place]) 01/29/2020  . 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[place]) 01/28/2020  . Adenocarcinoma of left lung, stage 4 ([place]) 01/18/2020  . Encounter for antineoplastic chemotherapy 01/18/2020  . Goals of care, counseling/discussion 01/18/2020  . Malignant neoplasm of lower lobe, left bronchus or lung ([place]) 01/14/2020  . Acute CVA (cerebrovascular accident) ([place]) 01/09/2020  . Intracranial bleeding ([place]) 01/09/2020  . TIA (transient ischemic attack) 01/08/2020  . Lumbar foraminal stenosis 12/30/2019  . Vertigo 09/25/2019  . Obstructive sleep apnea syndrome   . Gastro-esophageal reflux disease without esophagitis   . Anxiety   . Nonobstructive cardiomyopathy ([place])   . Hypothyroidism, unspecified   . Nonischemic cardiomyopathy ([place]) 06/04/2018  . Essential hypertension 06/04/2018  . Left bundle branch block 06/04/2018  . Dyslipidemia 06/04/2018    Alda Lea,  PT 07/07/2020, 7:36 PM  [facility] [address] [place], [place], [address] Phone: [phone]   Fax:  [phone]  Name: Duha Abair MRN: [id number] Date of Birth: 02-13-1946

## 2020-07-08 NOTE — Therapy (Signed)
[facility] [address] [place], [place], [address] Phone: [phone]   Fax:  [phone]  Physical Therapy Treatment  Patient Details  Name: Stefanie Camacho MRN: [id number] Date of Birth: February 03, 1946 Referring Provider (PT): Dr. [name]   Encounter Date: 07/07/2020   PT End of Session - 07/08/20 1418    Visit Number 4    Number of Visits 17    Date for PT Re-Evaluation 08/15/20    Authorization Type Aetna Medicare    Progress Note Due on Visit 10    PT Start Time 1105    PT Stop Time 1147    PT Time Calculation (min) 42 min    Activity Tolerance Patient tolerated treatment well    Behavior During Therapy [facility] for tasks assessed/performed           Past Medical History:  Diagnosis Date  . Acute CVA (cerebrovascular accident) ([place]) 01/09/2020  . Adenocarcinoma of left lung, stage 4 ([place]) 01/18/2020  . Anxiety   . Blood clots in brain   . 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 Chronic kidney disease, stage 3a ([place]) 05/16/2020  . Chronic respiratory failure ([place]) 05/16/2020  . Cough 03/18/2020  . Daytime somnolence 05/16/2020  . Dyslipidemia   . Encounter for antineoplastic chemotherapy 01/18/2020  . Essential hypertension   . Gastro-esophageal reflux disease without esophagitis   . Generalized weakness 03/18/2020  . GERD (gastroesophageal reflux disease)   . Goals of care, counseling/discussion 01/18/2020  . Hyperlipidemia 05/16/2020  . Hypothyroidism   . Hypothyroidism, unspecified   . Insomnia 05/16/2020  . Intracranial bleeding ([place]) 01/09/2020  . Left bundle branch block 06/04/2018  . 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 Long term (current) use of anticoagulants 05/16/2020  . Lumbar foraminal stenosis 12/30/2019  . Lung mass 05/16/2020  . 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[place]) 05/16/2020  . Major depression, single episode 05/16/2020  . Malignant neoplasm of lower lobe, left  bronchus or lung ([place]) 01/14/2020  . NICM (nonischemic cardiomyopathy) ([place])   . Nonischemic cardiomyopathy ([place]) 06/04/2018   Ejection fraction 45% in summer 2019  . Nonobstructive cardiomyopathy ([place])   . Obstructive sleep apnea syndrome   . Rosacea 05/16/2020  . 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[place]) 01/28/2020  . 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[place]) 01/29/2020  . Secondary malignant neoplasm of intrathoracic lymph nodes ([place]) 05/16/2020  . Single subsegmental pulmonary embolism without acute cor pulmonale ([place]) 03/25/2020  . Sleep apnea   . Stress incontinence (female) (female) 05/16/2020  . TIA (transient ischemic attack) 01/08/2020  . Vertigo 09/25/2019    Past Surgical History:  Procedure Laterality Date  . ABDOMINAL HYSTERECTOMY    . BLADDER NECK RECONSTRUCTION    . BRONCHIAL BIOPSY  01/12/2020   Procedure: BRONCHIAL BIOPSIES;  Surgeon: Garner Nash, DO;  Location: [facility];  Service: Pulmonary;;  . BRONCHIAL BRUSHINGS  01/12/2020   Procedure: BRONCHIAL BRUSHINGS;  Surgeon: Garner Nash, DO;  Location: [facility];  Service: Pulmonary;;  . BRONCHIAL NEEDLE ASPIRATION BIOPSY  01/12/2020   Procedure: BRONCHIAL NEEDLE ASPIRATION BIOPSIES;  Surgeon: Garner Nash, DO;  Location: [facility];  Service: Pulmonary;;  . BRONCHIAL WASHINGS  01/12/2020   Procedure: BRONCHIAL WASHINGS;  Surgeon: Garner Nash, DO;  Location: [facility];  Service: Pulmonary;;  . SHOULDER SURGERY    . TONSILLECTOMY    . 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 No/denies                              Vestibular Treatment/Exercise - 07/08/20 0001      Vestibular Treatment/Exercise   Vestibular Treatment Provided Canalith Repositioning    Canalith Repositioning Epley Manuever Left;Semont Procedure Left Posterior       EPLEY MANUEVER LEFT   Number of Reps  3    Overall Response  Improved Symptoms     RESPONSE DETAILS LEFT minimal nystagmus noted in initial position on 3rd rep of Epley      Semont Procedure Left Posterior   Number of Reps  2    Overall Response Improved Symptoms      Nestor Lewandowsky   Number of Reps  2    Symptom Description  mild c/o dizziness with Lt sidelying to sitting position; pt c/o unsteadiness - has slight rocking motion with sidelying to sitting position with c/o light-headedness                 PT Education - 07/08/20 1423    Education Details emphasized importance of doing Brandt-Daroff exercises for habituation of dizziness - reviewed these exercises with pt    Person(s) Educated Patient    Methods Explanation;Demonstration    Comprehension Verbalized understanding            PT Short Term Goals - 07/08/20 1422      PT SHORT TERM GOAL #1   Title Patient will be IND with initial HEP to improve dizziness and balance. TARGET DATE FOR ALL STGS: 07/14/20    Time 4    Period Weeks    Status New      PT SHORT TERM GOAL #2   Title Perform BERG and write goals as indicated.    Time 4    Period Weeks      PT SHORT TERM GOAL #3   Title Pt will improve gait speed with RW to >/=2.32ft/sec. to decr. falls risk.    Baseline 1.23ft/sec with RW    Time 4     Period Weeks    Status New      PT SHORT TERM GOAL #4   Title Complete vestibular exam and write goals as indicated.    Time 4    Period Weeks    Status New      PT SHORT TERM GOAL #5   Title Pt will amb. 300' over even terrain with LRAD at MOD I level to improve funcitonal mobility.    Time 4    Period Weeks             PT Long Term Goals - 07/08/20 1422  PT LONG TERM GOAL #1   Title Pt will report no falls in the last four weeks to improve safety. TARGET DATE FOR ALL LTGS: 08/11/20    Time 8    Period Weeks    Status New      PT LONG TERM GOAL #2   Title Pt will amb. 500' over even and paved surfaces at MOD I level with LRAD to improve functional mobility.    Time 8    Period Weeks    Status New      PT LONG TERM GOAL #3   Title Pt will report dizziness during all activities is </=2/10 to improve safety and QOL.    Time 8    Period Weeks    Status New                 Plan - 07/08/20 1419    Clinical Impression Statement Minimal carryover in reduction of dizziness noted with treatment of Epley maneuver; added Semont maneuver in today's session due to persistent Lt upbeating nystagmus and c/o dizziness.  Pt continues to c/o dizziness with sidelying to sitting which appears to be related to low BP.  Etiology of vertigo is not completely consistent with Lt BPPV at this time. Cont with POC.    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, Cardiomyopathy, HTN, L BBB, R PE, TIA, hypothyroidism, Kidney disease, anxiety, HLD, stress incontinence, OSA, Lx foraminal stenosis, B cataract extraction in May or June 2021-she feels like she needs long distance glasses    Examination-Activity Limitations Bed Mobility;Bend;Carry;Transfers;Locomotion Level;Squat;Lift;Toileting    Examination-Participation Restrictions  Cleaning;Meal Prep;Interpersonal Relationship;Driving    Stability/Clinical Decision Making Unstable/Unpredictable    Rehab Potential Fair   based on stage 4 lung CA with mets to brain and R hip   PT Frequency 2x / week    PT Duration 8 weeks    PT Treatment/Interventions ADLs/Self Care Home Management;Biofeedback;Canalith Repostioning;DME Instruction;Gait training;Stair training;Functional mobility training;Therapeutic activities;Therapeutic exercise;Balance training;Neuromuscular re-education;Orthotic Fit/Training;Patient/family education;Joint Manipulations    PT Next Visit Plan Reassess Lt BPPV and treat prn:  Perform BERG and write goals. Begin balance HEP.    Consulted and Agree with Plan of Care Patient           Patient will benefit from skilled therapeutic intervention in order to improve the following deficits and impairments:  Abnormal gait,Decreased endurance,Decreased knowledge of use of DME,Decreased balance,Decreased mobility,Decreased strength,Dizziness,Postural dysfunction  Visit Diagnosis: BPPV (benign paroxysmal positional vertigo), left     Problem List Patient Active Problem List   Diagnosis Date Noted  . 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 Chronic kidney disease, stage 3a ([place]) 05/16/2020  . Chronic respiratory failure ([place]) 05/16/2020  . Daytime somnolence 05/16/2020  . Hyperlipidemia 05/16/2020  . Insomnia 05/16/2020  . Long term (current) use of anticoagulants 05/16/2020  . Lung mass 05/16/2020  . 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[place]) 05/16/2020  . Major depression, single episode 05/16/2020  . 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 Rosacea 05/16/2020  . Stress incontinence (female) (female) 05/16/2020  . Single subsegmental pulmonary embolism without acute cor pulmonale ([place]) 03/25/2020  . Cough 03/18/2020  . Generalized weakness 03/18/2020  . Secondary malignant neoplasm of cerebral  meninges ([place]) 01/29/2020  . 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[place]) 01/28/2020  . Adenocarcinoma of left lung, stage 4 (  [place]) 01/18/2020  . Encounter for antineoplastic chemotherapy 01/18/2020  . Goals of care, counseling/discussion 01/18/2020  . Malignant neoplasm of lower lobe, left bronchus or lung ([place]) 01/14/2020  . Acute CVA (cerebrovascular accident) ([place]) 01/09/2020  . Intracranial bleeding ([place]) 01/09/2020  . TIA (transient ischemic attack) 01/08/2020  . Lumbar foraminal stenosis 12/30/2019  . Vertigo 09/25/2019  . Obstructive sleep apnea syndrome   . Gastro-esophageal reflux disease without esophagitis   . Anxiety   . Nonobstructive cardiomyopathy ([place])   . Hypothyroidism, unspecified   . Nonischemic cardiomyopathy ([place]) 06/04/2018  . Essential hypertension 06/04/2018  . Left bundle branch block 06/04/2018  . Dyslipidemia 06/04/2018    [place], PT 07/08/2020, 2:25 PM  [facility] [address] [place], [place], [address] Phone: [phone]   Fax:  [phone]  Name: Stefanie Camacho MRN: [id number] Date of Birth: December 28, 1945

## 2020-07-10 DIAGNOSIS — R3989 Other symptoms and signs involving the genitourinary system: Secondary | ICD-10-CM | POA: Diagnosis not present

## 2020-07-11 ENCOUNTER — Encounter: Payer: Self-pay | Admitting: Physical Therapy

## 2020-07-11 ENCOUNTER — Other Ambulatory Visit: Payer: Self-pay

## 2020-07-11 ENCOUNTER — Telehealth: Payer: Self-pay | Admitting: Cardiology

## 2020-07-11 ENCOUNTER — Ambulatory Visit: Payer: Medicare HMO | Admitting: Physical Therapy

## 2020-07-11 VITALS — BP 85/52 | HR 117

## 2020-07-11 DIAGNOSIS — R42 Dizziness and giddiness: Secondary | ICD-10-CM | POA: Diagnosis not present

## 2020-07-11 DIAGNOSIS — H8112 Benign paroxysmal vertigo, left ear: Secondary | ICD-10-CM | POA: Diagnosis not present

## 2020-07-11 DIAGNOSIS — R2689 Other abnormalities of gait and mobility: Secondary | ICD-10-CM | POA: Diagnosis not present

## 2020-07-11 DIAGNOSIS — M6281 Muscle weakness (generalized): Secondary | ICD-10-CM | POA: Diagnosis not present

## 2020-07-11 NOTE — Telephone Encounter (Signed)
Pt c/o BP issue: STAT if pt c/o blurred vision, one-sided weakness or slurred speech  1. What are your last 5 BP readings? 88/? - this was the only reading she could give me.   2. Are you having any other symptoms (ex. Dizziness, headache, blurred vision, passed out)? dizziness  3. What is your BP issue? Patient has low BP. She went to see [facility] [id number] today for her vertigo and was advised her BP was low and could be a cause of her vertigo. She states her records can be seen in her chart with her BP readings. Was told to f/u with her cardiologist. Scheduled patient for 03/09 with Dr. Agustin Cree.

## 2020-07-11 NOTE — Therapy (Signed)
[facility] [address] [place], [place], [address] Phone: [phone]   Fax:  [phone]  Physical Therapy Treatment  Patient Details  Name: Stefanie Camacho MRN: [id number] Date of Birth: 04/12/1946 Referring Provider (PT): Dr. [name]   Encounter Date: 07/11/2020   PT End of Session - 07/11/20 2035    Visit Number 5    Number of Visits 17    Date for PT Re-Evaluation 08/15/20    Authorization Type [employer]    Progress Note Due on Visit 10    PT Start Time 1020    PT Stop Time 1100    PT Time Calculation (min) 40 min    Activity Tolerance Patient tolerated treatment well    Behavior During Therapy [facility] for tasks assessed/performed           Past Medical History:  Diagnosis Date  . Acute CVA (cerebrovascular accident) ([place]) 01/09/2020  . Adenocarcinoma of left lung, stage 4 ([place]) 01/18/2020  . Anxiety   . Blood clots in brain   . 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 Chronic kidney disease, stage 3a ([place]) 05/16/2020  . Chronic respiratory failure ([place]) 05/16/2020  . Cough 03/18/2020  . Daytime somnolence 05/16/2020  . Dyslipidemia   . Encounter for antineoplastic chemotherapy 01/18/2020  . Essential hypertension   . Gastro-esophageal reflux disease without esophagitis   . Generalized weakness 03/18/2020  . GERD (gastroesophageal reflux disease)   . Goals of care, counseling/discussion 01/18/2020  . Hyperlipidemia 05/16/2020  . Hypothyroidism   . Hypothyroidism, unspecified   . Insomnia 05/16/2020  . Intracranial bleeding ([place]) 01/09/2020  . Left bundle branch block 06/04/2018  . 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 Long term (current) use of anticoagulants 05/16/2020  . Lumbar foraminal stenosis 12/30/2019  . Lung mass 05/16/2020  . 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[place]) 05/16/2020  . Major depression, single episode 05/16/2020  . Malignant neoplasm of lower lobe, left  bronchus or lung ([place]) 01/14/2020  . NICM (nonischemic cardiomyopathy) ([place])   . Nonischemic cardiomyopathy ([place]) 06/04/2018   Ejection fraction 45% in summer 2019  . Nonobstructive cardiomyopathy ([place])   . Obstructive sleep apnea syndrome   . Rosacea 05/16/2020  . 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[place]) 01/28/2020  . 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[place]) 01/29/2020  . Secondary malignant neoplasm of intrathoracic lymph nodes ([place]) 05/16/2020  . Single subsegmental pulmonary embolism without acute cor pulmonale ([place]) 03/25/2020  . Sleep apnea   . Stress incontinence (female) (female) 05/16/2020  . TIA (transient ischemic attack) 01/08/2020  . Vertigo 09/25/2019    Past Surgical History:  Procedure Laterality Date  . ABDOMINAL HYSTERECTOMY    . BLADDER NECK RECONSTRUCTION    . BRONCHIAL BIOPSY  01/12/2020   Procedure: BRONCHIAL BIOPSIES;  Surgeon: Garner Nash, DO;  Location: [facility];  Service: Pulmonary;;  . BRONCHIAL BRUSHINGS  01/12/2020   Procedure: BRONCHIAL BRUSHINGS;  Surgeon: Garner Nash, DO;  Location: [facility];  Service: Pulmonary;;  . BRONCHIAL NEEDLE ASPIRATION BIOPSY  01/12/2020   Procedure: BRONCHIAL NEEDLE ASPIRATION BIOPSIES;  Surgeon: Garner Nash, DO;  Location: [facility];  Service: Pulmonary;;  . BRONCHIAL WASHINGS  01/12/2020   Procedure: BRONCHIAL WASHINGS;  Surgeon: Garner Nash, DO;  Location: [facility];  Service: Pulmonary;;  . SHOULDER SURGERY    . TONSILLECTOMY    . VIDEO BRONCHOSCOPY WITH ENDOBRONCHIAL ULTRASOUND  01/12/2020   Procedure: VIDEO BRONCHOSCOPY WITH ENDOBRONCHIAL ULTRASOUND;  Surgeon: Garner Nash, DO;  Location: [facility];  Service: Pulmonary;;    Vitals:   07/11/20 1044  BP: (!) 85/52  Pulse: (!) 117     Subjective Assessment - 07/11/20 1024    Subjective Pt states she is weak today - developed UTI over the weekend ; had nausea and vomiting yesterday but doesn't think it was due to the dizziness;  got dizzy one day (maybe Saturday)  when she leaned over onto Rt side; states she is no longer having dizziness when she lies on her left side    Pertinent History 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 No/denies                   Vestibular Assessment - 07/11/20 0001      Positional Testing   Dix-Hallpike Dix-Hallpike Right;Dix-Hallpike Left    Sidelying Test Sidelying Right;Sidelying Left      Dix-Hallpike Right   Dix-Hallpike Right Duration none    Dix-Hallpike Right Symptoms No nystagmus      Dix-Hallpike Left   Dix-Hallpike Left Duration approx. 10 secs    Dix-Hallpike Left Symptoms Left nystagmus      Sidelying Right   Sidelying Right Duration none    Sidelying Right Symptoms No nystagmus      Sidelying Left   Sidelying Left Duration none    Sidelying Left Symptoms Left nystagmus      Positional Sensitivities   Supine to Left Side No dizziness    Supine to Right Side No dizziness    Supine to Sitting Lightheadedness    Right Hallpike No dizziness    Up from Right Hallpike Lightheadedness    Up from Left Hallpike Lightheadedness    Positional Sensitivities Comments pt has light-headedness with sidelying or supine to sitting position -- appears to be related to low BP                            PT Education - 07/11/20 2033    Education Details reviewed Brandt-Daroff exercises - pt reports she performed 1 rep only  - instructed to perform 3-5 reps but to discontinue exercise if light-headedness got worse with repeated sidelying to sitting position    Person(s) Educated Patient    Methods Explanation     Comprehension Verbalized understanding            PT Short Term Goals - 07/11/20 2041      PT SHORT TERM GOAL #1   Title Patient will be IND with initial HEP to improve dizziness and balance. TARGET DATE FOR ALL STGS: 07/14/20    Time 4    Period Weeks    Status New      PT SHORT TERM GOAL #2   Title Perform BERG and write goals as indicated.    Time 4    Period Weeks      PT SHORT TERM GOAL #3   Title Pt will improve gait speed with RW to >/=2.45ft/sec. to decr. falls risk.    Baseline 1.72ft/sec with RW    Time 4  Period Weeks    Status New      PT SHORT TERM GOAL #4   Title Complete vestibular exam and write goals as indicated.    Time 4    Period Weeks    Status New      PT SHORT TERM GOAL #5   Title Pt will amb. 300' over even terrain with LRAD at MOD I level to improve funcitonal mobility.    Time 4    Period Weeks             PT Long Term Goals - 07/11/20 2042      PT LONG TERM GOAL #1   Title Pt will report no falls in the last four weeks to improve safety. TARGET DATE FOR ALL LTGS: 08/11/20    Time 8    Period Weeks    Status New      PT LONG TERM GOAL #2   Title Pt will amb. 500' over even and paved surfaces at MOD I level with LRAD to improve functional mobility.    Time 8    Period Weeks    Status New      PT LONG TERM GOAL #3   Title Pt will report dizziness during all activities is </=2/10 to improve safety and QOL.    Time 8    Period Weeks    Status New                 Plan - 07/11/20 2036    Clinical Impression Statement Decreased Lt rotary upbeating nystagmus noted in Lt Dix-Hallpike test position, however, Lt nystagmus was noted with pt reporting only very mild dizziness.  Symptoms of dizziness are not consistent with Lt BPPV at this time - appears to be related to pt's low BP (pt has persistent c/o dizziness/light-headedness/unsteadiness with sidelying or supine to sitting positiion).  Symptoms may also be related to  dehydration as pt reports she currently has a UTI.  Will continue to monitor, however, pt was instructed to follow up with MD for further diagnostic testing of vertigo.    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, Cardiomyopathy, HTN, L BBB, R PE, TIA, hypothyroidism, Kidney disease, anxiety, HLD, stress incontinence, OSA, Lx foraminal stenosis, B cataract extraction in May or June 2021-she feels like she needs long distance glasses    Examination-Activity Limitations Bed Mobility;Bend;Carry;Transfers;Locomotion Level;Squat;Lift;Toileting    Examination-Participation Restrictions Cleaning;Meal Prep;Interpersonal Relationship;Driving    Stability/Clinical Decision Making Unstable/Unpredictable    Rehab Potential Fair   based on stage 4 lung CA with mets to brain and R hip   PT Frequency 2x / week    PT Duration 8 weeks    PT Treatment/Interventions ADLs/Self Care Home Management;Biofeedback;Canalith Repostioning;DME Instruction;Gait training;Stair training;Functional mobility training;Therapeutic activities;Therapeutic exercise;Balance training;Neuromuscular re-education;Orthotic Fit/Training;Patient/family education;Joint Manipulations    PT Next Visit Plan Reassess Lt BPPV and treat prn    PT Home Exercise Plan Brandt-Daroff exercises    Consulted and Agree with Plan of Care Patient           Patient will benefit from skilled therapeutic intervention in order to improve the following deficits and impairments:  Abnormal gait,Decreased endurance,Decreased knowledge of use of DME,Decreased balance,Decreased mobility,Decreased strength,Dizziness,Postural dysfunction  Visit Diagnosis: BPPV (benign paroxysmal positional vertigo), left  Dizziness and giddiness     Problem List Patient Active Problem List   Diagnosis Date Noted  . Chronic  kidney disease due to hypertension 05/16/2020  . Chronic kidney disease, stage 3a ([place]) 05/16/2020  . Chronic respiratory failure ([place]) 05/16/2020  . Daytime somnolence 05/16/2020  . Hyperlipidemia 05/16/2020  . Insomnia 05/16/2020  . Long term (current) use of anticoagulants 05/16/2020  . Lung mass 05/16/2020  . 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[place]) 05/16/2020  . Major depression, single episode 05/16/2020  . Secondary malignant neoplasm of intrathoracic lymph nodes ([place]) 05/16/2020  . Rosacea 05/16/2020  . Stress incontinence (female) (female) 05/16/2020  . Single subsegmental pulmonary embolism without acute cor pulmonale ([place]) 03/25/2020  . Cough 03/18/2020  . Generalized weakness 03/18/2020  . 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[place]) 01/29/2020  . 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[place]) 01/28/2020  . Adenocarcinoma of left lung, stage 4 ([place]) 01/18/2020  . Encounter for antineoplastic chemotherapy 01/18/2020  . Goals of care, counseling/discussion 01/18/2020  . Malignant neoplasm of lower lobe, left bronchus or lung ([place]) 01/14/2020  . Acute CVA (cerebrovascular accident) ([place]) 01/09/2020  . Intracranial bleeding ([place]) 01/09/2020  . TIA (transient ischemic attack) 01/08/2020  . Lumbar foraminal stenosis 12/30/2019  . Vertigo 09/25/2019  . Obstructive sleep apnea syndrome   . Gastro-esophageal reflux disease without esophagitis   . Anxiety   . Nonobstructive cardiomyopathy ([place])   . Hypothyroidism, unspecified   . Nonischemic cardiomyopathy ([place]) 06/04/2018  . Essential hypertension 06/04/2018  . Left bundle branch block 06/04/2018  . Dyslipidemia 06/04/2018    Alda Lea, PT 07/11/2020, 8:44 PM  [facility] [address] [place], [place], [address] Phone: [phone]   Fax:  [phone]  Name: Stefanie Camacho MRN: [id number] Date  of Birth: 09-06-1945

## 2020-07-12 DIAGNOSIS — I669 Occlusion and stenosis of unspecified cerebral artery: Secondary | ICD-10-CM | POA: Insufficient documentation

## 2020-07-12 DIAGNOSIS — R296 Repeated falls: Secondary | ICD-10-CM

## 2020-07-12 DIAGNOSIS — I428 Other cardiomyopathies: Secondary | ICD-10-CM | POA: Insufficient documentation

## 2020-07-12 DIAGNOSIS — E039 Hypothyroidism, unspecified: Secondary | ICD-10-CM | POA: Insufficient documentation

## 2020-07-12 DIAGNOSIS — G4730 Sleep apnea, unspecified: Secondary | ICD-10-CM | POA: Insufficient documentation

## 2020-07-12 DIAGNOSIS — K219 Gastro-esophageal reflux disease without esophagitis: Secondary | ICD-10-CM | POA: Insufficient documentation

## 2020-07-12 HISTORY — DX: Repeated falls: R29.6

## 2020-07-12 NOTE — Telephone Encounter (Signed)
Called patient. Informed her that Dr. Agustin Cree reviewed her call. Advised her to stay hydrated and getup slowly when changing positions. Advised her of appointment tomorrow. Advised her to call us back if something changes or gets worse in the mean time. She understood no further questions.

## 2020-07-13 ENCOUNTER — Ambulatory Visit (INDEPENDENT_AMBULATORY_CARE_PROVIDER_SITE_OTHER): Payer: Medicare HMO

## 2020-07-13 ENCOUNTER — Encounter: Payer: Self-pay | Admitting: Cardiology

## 2020-07-13 ENCOUNTER — Ambulatory Visit: Payer: Medicare HMO | Admitting: Cardiology

## 2020-07-13 ENCOUNTER — Other Ambulatory Visit: Payer: Self-pay

## 2020-07-13 VITALS — BP 94/62 | HR 101 | Ht 65.5 in | Wt 167.0 lb

## 2020-07-13 DIAGNOSIS — I10 Essential (primary) hypertension: Secondary | ICD-10-CM | POA: Diagnosis not present

## 2020-07-13 DIAGNOSIS — R002 Palpitations: Secondary | ICD-10-CM | POA: Diagnosis not present

## 2020-07-13 DIAGNOSIS — C3492 Malignant neoplasm of unspecified part of left bronchus or lung: Secondary | ICD-10-CM

## 2020-07-13 DIAGNOSIS — I447 Left bundle-branch block, unspecified: Secondary | ICD-10-CM

## 2020-07-13 DIAGNOSIS — C3432 Malignant neoplasm of lower lobe, left bronchus or lung: Secondary | ICD-10-CM

## 2020-07-13 DIAGNOSIS — I428 Other cardiomyopathies: Secondary | ICD-10-CM | POA: Diagnosis not present

## 2020-07-13 NOTE — Progress Notes (Signed)
Cardiology Office Note:    Date:  07/13/2020   ID:  [name], DOB April 25, 1946, MRN [id number]  PCP:  [name], MD  Cardiologist:  Jenne Campus, MD    Referring MD: [name], MD   Chief Complaint  Patient presents with  . Dizziness    History of Present Illness:    Stefanie Camacho is a 75 y.o. female with past medical history significant for left bundle branch block, nonischemic cardiomyopathy with ejection fraction 40 to 45%, essential hypertension, dyslipidemia, ventricular ectopy with nonsustained ventricular tachycardia.  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  That being aggressively treated with radiation as well as chemotherapy. She comes today to my office for follow-up overall cardiac wise doing fine but she described to have some episode of dizziness it looks like she does have a component of vertigo however there is also some component of orthostatic hypotension.  She does have diminished left ventricle ejection fraction as well as history of nonsustained ventricular tachycardia which may be concerned about possibility of arrhythmia responsible for her symptoms.  Therefore we will ask her to wear Zio patch for a week again.  Past Medical History:  Diagnosis Date  . Acute CVA (cerebrovascular accident) ([place]) 01/09/2020  . Adenocarcinoma of left lung, stage 4 ([place]) 01/18/2020  . Anxiety   . Blood clots in brain   . 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 Chronic kidney disease, stage 3a ([place]) 05/16/2020  . Chronic respiratory failure ([place]) 05/16/2020  . Cough 03/18/2020  . Daytime somnolence 05/16/2020  . Dyslipidemia   . Encounter for antineoplastic chemotherapy 01/18/2020  . Essential hypertension   . Gastro-esophageal reflux disease without esophagitis   . Generalized weakness 03/18/2020  . GERD (gastroesophageal reflux disease)   . Goals of care, counseling/discussion  01/18/2020  . Hyperlipidemia 05/16/2020  . Hypothyroidism   . Hypothyroidism, unspecified   . Insomnia 05/16/2020  . Intracranial bleeding ([place]) 01/09/2020  . Left bundle branch block 06/04/2018  . 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 Long term (current) use of anticoagulants 05/16/2020  . Lumbar foraminal stenosis 12/30/2019  . Lung mass 05/16/2020  . 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[place]) 05/16/2020  . Major depression, single episode 05/16/2020  . Malignant neoplasm of lower lobe, left bronchus or lung ([place]) 01/14/2020  . NICM (nonischemic cardiomyopathy) ([place])   . Nonischemic cardiomyopathy ([place]) 06/04/2018   Ejection fraction 45% in summer 2019  . Nonobstructive cardiomyopathy ([place])   . Obstructive sleep apnea syndrome   . Rosacea 05/16/2020  . 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[place]) 01/28/2020  . 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[place]) 01/29/2020  . Secondary malignant neoplasm of intrathoracic lymph nodes ([place]) 05/16/2020  . Single subsegmental pulmonary embolism without acute cor pulmonale ([place]) 03/25/2020  . Sleep apnea   . Stress incontinence (female) (female) 05/16/2020  . TIA (transient ischemic attack) 01/08/2020  . Vertigo 09/25/2019    Past Surgical History:  Procedure Laterality Date  . ABDOMINAL HYSTERECTOMY    . BLADDER NECK RECONSTRUCTION    . BRONCHIAL BIOPSY  01/12/2020   Procedure: BRONCHIAL BIOPSIES;  Surgeon: Garner Nash, DO;  Location: [facility];  Service: Pulmonary;;  . BRONCHIAL BRUSHINGS  01/12/2020   Procedure: BRONCHIAL BRUSHINGS;  Surgeon: Garner Nash, DO;  Location: [facility];  Service: Pulmonary;;  . BRONCHIAL NEEDLE ASPIRATION BIOPSY  01/12/2020   Procedure: BRONCHIAL NEEDLE ASPIRATION BIOPSIES;  Surgeon: Valeta Harms,  Octavio Graves, DO;  Location: [facility];  Service: Pulmonary;;  . BRONCHIAL WASHINGS  01/12/2020   Procedure: BRONCHIAL WASHINGS;  Surgeon: Garner Nash, DO;  Location: [facility];  Service: Pulmonary;;  .  SHOULDER SURGERY    . TONSILLECTOMY    . VIDEO BRONCHOSCOPY WITH ENDOBRONCHIAL ULTRASOUND  01/12/2020   Procedure: VIDEO BRONCHOSCOPY WITH ENDOBRONCHIAL ULTRASOUND;  Surgeon: Garner Nash, DO;  Location: [facility];  Service: Pulmonary;;    Current Medications: Current Meds  Medication Sig  . acetaminophen (TYLENOL) 500 MG tablet Take 1,000 mg by mouth every 6 (six) hours as needed for mild pain.  Marland Kitchen apixaban (ELIQUIS) 5 MG TABS tablet Take 1 tablet (5 mg total) by mouth 2 (two) times daily.  Marland Kitchen atorvastatin (LIPITOR) 10 MG tablet Take 1 tablet (10 mg total) by mouth daily at 6 PM.  . cephALEXin (KEFLEX) 500 MG capsule Take 500 mg by mouth in the morning and at bedtime.  Marland Kitchen FLUoxetine (PROZAC) 20 MG capsule Take 20 mg by mouth daily.  Marland Kitchen levETIRAcetam (KEPPRA) 500 MG tablet Take 1 tablet by mouth twice daily  . levothyroxine (SYNTHROID) 75 MCG tablet Take 1 tablet (75 mcg total) by mouth daily.  Marland Kitchen loperamide (IMODIUM) 2 MG capsule Take 2 mg by mouth as needed for diarrhea or loose stools.  Marland Kitchen losartan (COZAAR) 25 MG tablet Take 1 tablet (25 mg total) by mouth daily.  . metoprolol succinate (TOPROL-XL) 50 MG 24 hr tablet Take 1 tablet (50 mg total) by mouth daily. Take with or immediately following a meal.  . osimertinib mesylate (TAGRISSO) 80 MG tablet Take 2 tablets (160 mg total) by mouth daily. Days 1-28.  . pantoprazole (PROTONIX) 40 MG tablet Take 1 tablet by mouth once daily  . prochlorperazine (COMPAZINE) 10 MG tablet Take 1 tablet (10 mg total) by mouth every 6 (six) hours as needed for nausea or vomiting.  Marland Kitchen spironolactone (ALDACTONE) 25 MG tablet Take 0.5 tablets (12.5 mg total) by mouth daily.     Allergies:   Sertraline hcl   Social History   Socioeconomic History  . Marital status: Divorced    Spouse name: Not on file  . Number of children: Not on file  . Years of education: Not on file  . Highest education level: Not on file  Occupational History  . Not on file   Tobacco Use  . Smoking status: Never Smoker  . Smokeless tobacco: Never Used  Vaping Use  . Vaping Use: Never used  Substance and Sexual Activity  . Alcohol use: Not Currently  . Drug use: Never  . Sexual activity: Not on file  Other Topics Concern  . Not on file  Social History Narrative  . Not on file   Social Determinants of Health   Financial Resource Strain: Not on file  Food Insecurity: Not on file  Transportation Needs: Not on file  Physical Activity: Not on file  Stress: Not on file  Social Connections: Not on file     Family History: The patient's family history includes Heart disease in her father, maternal grandfather, mother, and paternal grandfather; Stroke in her father and mother. ROS:   Please see the history of present illness.    All 14 point review of systems negative except as described per history of present illness  EKGs/Labs/Other Studies Reviewed:      Recent Labs: 06/24/2020: ALT 6; BUN 23; Creatinine 1.20; Hemoglobin 12.6; Platelet Count 163; Potassium 3.6; Sodium 139  Recent Lipid Panel  Component Value Date/Time   CHOL 127 01/09/2020 0416   CHOL 133 07/17/2018 0933   TRIG 112 01/09/2020 0416   HDL 38 (L) 01/09/2020 0416   HDL 40 07/17/2018 0933   CHOLHDL 3.3 01/09/2020 0416   VLDL 22 01/09/2020 0416   LDLCALC 67 01/09/2020 0416   LDLCALC 67 07/17/2018 0933    Physical Exam:    VS:  BP 94/62 (BP Location: Left Arm, Patient Position: Sitting)   Pulse (!) 101   Ht 5' 5.5" (1.664 m)   Wt 167 lb (75.8 kg)   SpO2 95%   BMI 27.37 kg/m     Wt Readings from Last 3 Encounters:  07/13/20 167 lb (75.8 kg)  06/28/20 156 lb 6.4 oz (70.9 kg)  05/25/20 166 lb 8 oz (75.5 kg)     GEN:  Well nourished, well developed in no acute distress HEENT: Normal NECK: No JVD; No carotid bruits LYMPHATICS: No lymphadenopathy CARDIAC: RRR, no murmurs, no rubs, no gallops RESPIRATORY:  Clear to auscultation without rales, wheezing or rhonchi   ABDOMEN: Soft, non-tender, non-distended MUSCULOSKELETAL:  No edema; No deformity  SKIN: Warm and dry LOWER EXTREMITIES: no swelling NEUROLOGIC:  Alert and oriented x 3 PSYCHIATRIC:  Normal affect   ASSESSMENT:    1. Nonischemic cardiomyopathy ([place])   2. Left bundle branch block   3. Essential hypertension   4. Malignant neoplasm of lower lobe, left bronchus or lung ([place])   5. Adenocarcinoma of left lung, stage 4 ([facility])    PLAN:    In order of problems listed above:  1. Nonischemic cardiomyopathy ejection fraction 40 to 45% she is on beta-blocker as well as ARB which I will continue at present dosages.  Luckily on the physical examination she does not have signs and symptoms of clinical decompensation.  I will ask her to increase fluid intake and hopefully by doing this will prevent her from having hypotension and episodes of orthostatic hypotension.  We did also talk about alternative approaches to this problem which include elastic stockings.  If that will not be sufficient then we will may be forced to cut down some of her medication for cardiomyopathy. 2. Left bundle branch block which is chronic problem noted. 3. Essential hypertension, we have opposite problem when I would blood pressure being low. 4. Malignant lung cancer with metastasis.  That being aggressively and excellently follow-up by oncology team. 5. Vertigo.  Her description she gave me really look like vertigo she is getting some physical therapy with relief for that also does have some orthostatic hypotension as well as some worried she may have some rhythm related dizziness as well.  Therefore, she will wear Zio patch for a week to see if there are any significant arrhythmia.   Medication Adjustments/Labs and Tests Ordered: Current medicines are reviewed at length with the patient today.  Concerns regarding medicines are outlined above.  No orders of the defined types were placed in this encounter.  Medication  changes: No orders of the defined types were placed in this encounter.   Signed, Park Liter, MD, [facility] 07/13/2020 2:48 PM    [place]

## 2020-07-13 NOTE — Addendum Note (Signed)
Addended by: Senaida Ores on: 07/13/2020 03:01 PM   Modules accepted: Orders

## 2020-07-13 NOTE — Patient Instructions (Signed)
Medication Instructions:  Your physician recommends that you continue on your current medications as directed. Please refer to the Current Medication list given to you today.  *If you need a refill on your cardiac medications before your next appointment, please call your pharmacy*   Lab Work: None. If you have labs (blood work) drawn today and your tests are completely normal, you will receive your results only by: Marland Kitchen MyChart Message (if you have MyChart) OR . A paper copy in the mail If you have any lab test that is abnormal or we need to change your treatment, we will call you to review the results.   Testing/Procedures: A zio monitor was ordered today. It will remain on for 7 days. You will then return monitor and event diary in provided box. It takes 1-2 weeks for report to be downloaded and returned to [place]. We will call you with the results. If monitor falls off or has orange flashing light, please call Zio for further instructions.      Follow-Up: At [facility], you and your health needs are our priority.  As part of our continuing mission to provide you with exceptional heart care, we have created designated Provider Care Teams.  These Care Teams include your primary Cardiologist (physician) and Advanced Practice Providers (APPs -  Physician Assistants and Nurse Practitioners) who all work together to provide you with the care you need, when you need it.  We recommend signing up for the patient portal called "MyChart".  Sign up information is provided on this After Visit Summary.  MyChart is used to connect with patients for Virtual Visits (Telemedicine).  Patients are able to view lab/test results, encounter notes, upcoming appointments, etc.  Non-urgent messages can be sent to your provider as well.   To learn more about what you can do with MyChart, go to [email or web address].    Your next appointment:   2 month(s)  The format for your next appointment:   In  Person  Provider:   Jenne Campus, MD   Other Instructions

## 2020-07-14 ENCOUNTER — Ambulatory Visit: Payer: Medicare HMO | Admitting: Physical Therapy

## 2020-07-14 DIAGNOSIS — H8112 Benign paroxysmal vertigo, left ear: Secondary | ICD-10-CM | POA: Diagnosis not present

## 2020-07-14 DIAGNOSIS — R2689 Other abnormalities of gait and mobility: Secondary | ICD-10-CM | POA: Diagnosis not present

## 2020-07-14 DIAGNOSIS — R42 Dizziness and giddiness: Secondary | ICD-10-CM

## 2020-07-14 DIAGNOSIS — M6281 Muscle weakness (generalized): Secondary | ICD-10-CM

## 2020-07-14 NOTE — Patient Instructions (Signed)
Hip Extension (Standing)    Stand with support. Squeeze pelvic floor and hold. Move right leg backward with straight knee. Hold for _2__ seconds. Relax for 5___ seconds. Repeat __10_ times. Do _2__ times a day. Repeat with other leg.       Functional Quadriceps: Sit to Stand    Sit on edge of chair, feet flat on floor. Stand upright, extending knees fully. Repeat _10___ times per set. Do _1___ sets per session. Do __1-2__ sessions per day.  Use arms as needed for assist   http://orth.exer.us/735   Copyright  VHI. All rights reserved.

## 2020-07-15 NOTE — Therapy (Signed)
[facility] [address] [place], [place], [address] Phone: [phone]   Fax:  [phone]  Physical Therapy Treatment  Patient Details  Name: Stefanie Camacho MRN: [id number] Date of Birth: February 28, 1946 Referring Provider (PT): Dr. [name]   Encounter Date: 07/14/2020   PT End of Session - 07/15/20 2213    Visit Number 6    Number of Visits 17    Date for PT Re-Evaluation 08/15/20    Authorization Type [employer]    Progress Note Due on Visit 10    PT Start Time 1105    PT Stop Time 1150    PT Time Calculation (min) 45 min    Activity Tolerance Patient tolerated treatment well    Behavior During Therapy [facility] for tasks assessed/performed           Past Medical History:  Diagnosis Date  . Acute CVA (cerebrovascular accident) ([place]) 01/09/2020  . Adenocarcinoma of left lung, stage 4 ([place]) 01/18/2020  . Anxiety   . Blood clots in brain   . 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 Chronic kidney disease, stage 3a ([place]) 05/16/2020  . Chronic respiratory failure ([place]) 05/16/2020  . Cough 03/18/2020  . Daytime somnolence 05/16/2020  . Dyslipidemia   . Encounter for antineoplastic chemotherapy 01/18/2020  . Essential hypertension   . Gastro-esophageal reflux disease without esophagitis   . Generalized weakness 03/18/2020  . GERD (gastroesophageal reflux disease)   . Goals of care, counseling/discussion 01/18/2020  . Hyperlipidemia 05/16/2020  . Hypothyroidism   . Hypothyroidism, unspecified   . Insomnia 05/16/2020  . Intracranial bleeding ([place]) 01/09/2020  . Left bundle branch block 06/04/2018  . 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 Long term (current) use of anticoagulants 05/16/2020  . Lumbar foraminal stenosis 12/30/2019  . Lung mass 05/16/2020  . 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[place]) 05/16/2020  . Major depression, single episode 05/16/2020  . Malignant neoplasm of lower lobe, left  bronchus or lung ([place]) 01/14/2020  . NICM (nonischemic cardiomyopathy) ([place])   . Nonischemic cardiomyopathy ([place]) 06/04/2018   Ejection fraction 45% in summer 2019  . Nonobstructive cardiomyopathy ([place])   . Obstructive sleep apnea syndrome   . Rosacea 05/16/2020  . 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[place]) 01/28/2020  . 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[place]) 01/29/2020  . Secondary malignant neoplasm of intrathoracic lymph nodes ([place]) 05/16/2020  . Single subsegmental pulmonary embolism without acute cor pulmonale ([place]) 03/25/2020  . Sleep apnea   . Stress incontinence (female) (female) 05/16/2020  . TIA (transient ischemic attack) 01/08/2020  . Vertigo 09/25/2019    Past Surgical History:  Procedure Laterality Date  . ABDOMINAL HYSTERECTOMY    . BLADDER NECK RECONSTRUCTION    . BRONCHIAL BIOPSY  01/12/2020   Procedure: BRONCHIAL BIOPSIES;  Surgeon: Garner Nash, DO;  Location: [facility];  Service: Pulmonary;;  . BRONCHIAL BRUSHINGS  01/12/2020   Procedure: BRONCHIAL BRUSHINGS;  Surgeon: Garner Nash, DO;  Location: [facility];  Service: Pulmonary;;  . BRONCHIAL NEEDLE ASPIRATION BIOPSY  01/12/2020   Procedure: BRONCHIAL NEEDLE ASPIRATION BIOPSIES;  Surgeon: Garner Nash, DO;  Location: [facility];  Service: Pulmonary;;  . BRONCHIAL WASHINGS  01/12/2020   Procedure: BRONCHIAL WASHINGS;  Surgeon: Garner Nash, DO;  Location: [facility];  Service: Pulmonary;;  . SHOULDER SURGERY    . TONSILLECTOMY    . VIDEO BRONCHOSCOPY WITH ENDOBRONCHIAL ULTRASOUND  01/12/2020   Procedure: VIDEO BRONCHOSCOPY WITH ENDOBRONCHIAL ULTRASOUND;  Surgeon: Garner Nash, DO;  Location: [facility];  Service: Pulmonary;;    There were no vitals filed for this visit.   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 No/denies            Lt Dix-Hallpike test - moderate dizziness reported Rt Dix-Hallpike test - No dizziness reported   Sit to Lt sidelying - nystagmus noted but no dizziness reported - pt states she has "unsteadiness"  Rep 1 of sit to Lt sidelying - dizziness with return to sitting Rep 2 - same as above Rep 3 - Less nystagmus in Lt sidelying noted;  More light-headedness with sidelying to sitting transfer Rep 4 - minimal nystagmus noted; less light-headedness - "feel a little unsteady"/little dizziness  Rep 5 - minimal nystagmus - same as rep 4 - "feel a little unsteady"/little dizziness              [facility] Adult PT Treatment/Exercise - 07/15/20 0001      Transfers   Transfers Sit to Stand    Sit to Stand 4: Min guard    Number of Reps Other reps (comment)   5   Comments with UE support from mat table to RW      Exercises   Exercises Knee/Hip      Knee/Hip Exercises: Standing   Hip Extension AROM;Both;1 set;10 reps   with UE support on cabinet   Other Standing Knee Exercises Marching 10 reps each leg with UE support on cabinet                  PT Education - 07/15/20 2212    Education Details added LE strengthening to HEP    Person(s) Educated Patient    Methods Explanation;Demonstration;Handout    Comprehension Verbalized understanding;Returned demonstration            PT Short Term Goals - 07/15/20 2218      PT SHORT TERM GOAL #1   Title Patient will be IND with initial HEP to improve dizziness and balance. TARGET DATE FOR ALL STGS: 07/14/20    Time 4    Period Weeks     Status New      PT SHORT TERM GOAL #2   Title Perform BERG and write goals as indicated.    Time 4    Period Weeks      PT SHORT TERM GOAL #3   Title Pt will improve gait speed with RW to >/=2.21ft/sec. to decr. falls risk.    Baseline 1.73ft/sec with RW    Time 4    Period Weeks    Status New      PT SHORT TERM GOAL #4   Title Complete vestibular exam and write goals as indicated.    Time 4    Period Weeks    Status New      PT SHORT TERM GOAL #5   Title Pt  will amb. 300' over even terrain with LRAD at MOD I level to improve funcitonal mobility.    Time 4    Period Weeks             PT Long Term Goals - 07/15/20 2218      PT LONG TERM GOAL #1   Title Pt will report no falls in the last four weeks to improve safety. TARGET DATE FOR ALL LTGS: 08/11/20    Time 8    Period Weeks    Status New      PT LONG TERM GOAL #2   Title Pt will amb. 500' over even and paved surfaces at MOD I level with LRAD to improve functional mobility.    Time 8    Period Weeks    Status New      PT LONG TERM GOAL #3   Title Pt will report dizziness during all activities is </=2/10 to improve safety and QOL.    Time 8    Period Weeks    Status New                 Plan - 07/15/20 2214    Clinical Impression Statement Pt does not appear to have signs and symptoms completely consistent with Lt BPPV at this time; no rotary upbeating nystagmus noted and no c/o spinning vertigo reported in Dix-Hallpike test positions.  Pt does have nystagmus in Lt sidelying position but no specific direction determined and pt reports no vertigo while nystagmus is occurring. Reviewed previous strengthening exercises issued for LE strengthening and added sit to stand and hip extension.  Cont with POC.    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,  Cardiomyopathy, HTN, L BBB, R PE, TIA, hypothyroidism, Kidney disease, anxiety, HLD, stress incontinence, OSA, Lx foraminal stenosis, B cataract extraction in May or June 2021-she feels like she needs long distance glasses    Examination-Activity Limitations Bed Mobility;Bend;Carry;Transfers;Locomotion Level;Squat;Lift;Toileting    Examination-Participation Restrictions Cleaning;Meal Prep;Interpersonal Relationship;Driving    Stability/Clinical Decision Making Unstable/Unpredictable    Rehab Potential Fair   based on stage 4 lung CA with mets to brain and R hip   PT Frequency 2x / week    PT Duration 8 weeks    PT Treatment/Interventions ADLs/Self Care Home Management;Biofeedback;Canalith Repostioning;DME Instruction;Gait training;Stair training;Functional mobility training;Therapeutic activities;Therapeutic exercise;Balance training;Neuromuscular re-education;Orthotic Fit/Training;Patient/family education;Joint Manipulations    PT Next Visit Plan Reassess Lt BPPV and treat prn    PT Home Exercise Plan Brandt-Daroff exercises    Consulted and Agree with Plan of Care Patient           Patient will benefit from skilled therapeutic intervention in order to improve the following deficits and impairments:  Abnormal gait,Decreased endurance,Decreased knowledge of use of DME,Decreased balance,Decreased mobility,Decreased strength,Dizziness,Postural dysfunction  Visit Diagnosis: Dizziness and giddiness  Muscle weakness (generalized)     Problem List Patient Active Problem List   Diagnosis Date Noted  . Recurrent falls 07/12/2020  . Sleep apnea   . NICM (nonischemic cardiomyopathy) ([place])   . Hypothyroidism   . GERD (gastroesophageal reflux disease)   . Blood clots in brain   . 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 Chronic kidney disease, stage 3a ([place]) 05/16/2020  . Chronic respiratory failure ([place]) 05/16/2020  . Hyperlipidemia 05/16/2020  . Insomnia 05/16/2020  .  Long term (current) use of anticoagulants 05/16/2020  . Lung  mass 05/16/2020  . 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[place]) 05/16/2020  . Major depression, single episode 05/16/2020  . Secondary malignant neoplasm of intrathoracic lymph nodes ([place]) 05/16/2020  . Rosacea 05/16/2020  . Stress incontinence (female) (female) 05/16/2020  . Single subsegmental pulmonary embolism without acute cor pulmonale ([place]) 03/25/2020  . Cough 03/18/2020  . Generalized weakness 03/18/2020  . 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[place]) 01/29/2020  . 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[place]) 01/28/2020  . Adenocarcinoma of left lung, stage 4 ([place]) 01/18/2020  . Encounter for antineoplastic chemotherapy 01/18/2020  . Goals of care, counseling/discussion 01/18/2020  . Malignant neoplasm of lower lobe, left bronchus or lung ([place]) 01/14/2020  . Acute CVA (cerebrovascular accident) ([place]) 01/09/2020  . Intracranial bleeding ([place]) 01/09/2020  . TIA (transient ischemic attack) 01/08/2020  . Lumbar foraminal stenosis 12/30/2019  . Dizziness 09/25/2019  . Obstructive sleep apnea syndrome   . Gastro-esophageal reflux disease without esophagitis   . Anxiety   . Nonobstructive cardiomyopathy ([place])   . Hypothyroidism, unspecified   . Nonischemic cardiomyopathy ([place]) 06/04/2018  . Essential hypertension 06/04/2018  . Left bundle branch block 06/04/2018  . Dyslipidemia 06/04/2018    Alda Lea, PT 07/15/2020, 10:19 PM  [facility] [address] [place], [place], [address] Phone: [phone]   Fax:  [phone]  Name: Ashleyanne Hemmingway MRN: [id number] Date of Birth: 31-Aug-1945

## 2020-07-18 ENCOUNTER — Other Ambulatory Visit: Payer: Self-pay | Admitting: Medical Oncology

## 2020-07-18 ENCOUNTER — Telehealth: Payer: Self-pay | Admitting: Cardiology

## 2020-07-18 ENCOUNTER — Ambulatory Visit: Payer: Medicare HMO | Admitting: Physical Therapy

## 2020-07-18 ENCOUNTER — Other Ambulatory Visit: Payer: Self-pay

## 2020-07-18 DIAGNOSIS — H8112 Benign paroxysmal vertigo, left ear: Secondary | ICD-10-CM | POA: Diagnosis not present

## 2020-07-18 DIAGNOSIS — I447 Left bundle-branch block, unspecified: Secondary | ICD-10-CM | POA: Diagnosis not present

## 2020-07-18 DIAGNOSIS — M6281 Muscle weakness (generalized): Secondary | ICD-10-CM | POA: Diagnosis not present

## 2020-07-18 DIAGNOSIS — R42 Dizziness and giddiness: Secondary | ICD-10-CM | POA: Diagnosis not present

## 2020-07-18 DIAGNOSIS — R2689 Other abnormalities of gait and mobility: Secondary | ICD-10-CM

## 2020-07-18 DIAGNOSIS — R002 Palpitations: Secondary | ICD-10-CM

## 2020-07-18 DIAGNOSIS — C3492 Malignant neoplasm of unspecified part of left bronchus or lung: Secondary | ICD-10-CM

## 2020-07-18 MED ORDER — OSIMERTINIB MESYLATE 80 MG PO TABS: 160.0000 mg | ORAL_TABLET | Freq: Every day | ORAL | 0 refills | Status: DC

## 2020-07-18 NOTE — Addendum Note (Signed)
Addended by: Ardeen Garland on: 07/18/2020 10:25 AM   Modules accepted: Orders

## 2020-07-18 NOTE — Telephone Encounter (Signed)
Tried to call patient back. No answer and voicemail full. Will continue efforts.

## 2020-07-18 NOTE — Telephone Encounter (Signed)
New Message:    Pt said she was wearing a Monitor and one of the flaps came off and the warning light came on. She called the company and they told her to return the Monitor and get the results from the data already collected. She just wanted to make sure that this was alright with Dr Stefanie Camacho.

## 2020-07-18 NOTE — Telephone Encounter (Signed)
Requests Tarceva refill.

## 2020-07-19 ENCOUNTER — Encounter: Payer: Self-pay | Admitting: Physical Therapy

## 2020-07-19 NOTE — Telephone Encounter (Signed)
Attempted to call patient. No answer and voicemail full.

## 2020-07-19 NOTE — Telephone Encounter (Signed)
Spoke to patient she reports since Sunday the orange light on monitor has been flashing. I advised her that this means it is not recording. She will send back and we will see what the 4 days she wore captured. No further questions will inform Dr. Agustin Cree.

## 2020-07-19 NOTE — Telephone Encounter (Signed)
Patient following up from call to office yesterday

## 2020-07-19 NOTE — Therapy (Signed)
[facility] [address] [place], [place], [address] Phone: [phone]   Fax:  [phone]  Physical Therapy Treatment  Patient Details  Name: Stefanie Camacho MRN: [id number] Date of Birth: [birth year] Referring Provider (PT): Dr. [name]   Encounter Date: 07/18/2020   PT End of Session - 07/19/20 2246    Visit Number 7    Number of Visits 17    Date for PT Re-Evaluation 08/15/20    Authorization Type [employer]    Progress Note Due on Visit 10    PT Start Time 1020    PT Stop Time 1100    PT Time Calculation (min) 40 min    Activity Tolerance Patient tolerated treatment well    Behavior During Therapy [facility] for tasks assessed/performed           Past Medical History:  Diagnosis Date  . Acute CVA (cerebrovascular accident) ([place]) 01/09/2020  . Adenocarcinoma of left lung, stage 4 ([place]) 01/18/2020  . Anxiety   . Blood clots in brain   . 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 Chronic kidney disease, stage 3a ([place]) 05/16/2020  . Chronic respiratory failure ([place]) 05/16/2020  . Cough 03/18/2020  . Daytime somnolence 05/16/2020  . Dyslipidemia   . Encounter for antineoplastic chemotherapy 01/18/2020  . Essential hypertension   . Gastro-esophageal reflux disease without esophagitis   . Generalized weakness 03/18/2020  . GERD (gastroesophageal reflux disease)   . Goals of care, counseling/discussion 01/18/2020  . Hyperlipidemia 05/16/2020  . Hypothyroidism   . Hypothyroidism, unspecified   . Insomnia 05/16/2020  . Intracranial bleeding ([place]) 01/09/2020  . Left bundle branch block 06/04/2018  . 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 Long term (current) use of anticoagulants 05/16/2020  . Lumbar foraminal stenosis 12/30/2019  . Lung mass 05/16/2020  . 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[place]) 05/16/2020  . Major depression, single episode 05/16/2020  . Malignant neoplasm of lower lobe, left  bronchus or lung ([place]) 01/14/2020  . NICM (nonischemic cardiomyopathy) ([place])   . Nonischemic cardiomyopathy ([place]) 06/04/2018   Ejection fraction 45% in summer 2019  . Nonobstructive cardiomyopathy ([place])   . Obstructive sleep apnea syndrome   . Rosacea 05/16/2020  . 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[place]) 01/28/2020  . 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[place]) 01/29/2020  . Secondary malignant neoplasm of intrathoracic lymph nodes ([place]) 05/16/2020  . Single subsegmental pulmonary embolism without acute cor pulmonale ([place]) 03/25/2020  . Sleep apnea   . Stress incontinence (female) (female) 05/16/2020  . TIA (transient ischemic attack) 01/08/2020  . Vertigo 09/25/2019    Past Surgical History:  Procedure Laterality Date  . ABDOMINAL HYSTERECTOMY    . BLADDER NECK RECONSTRUCTION    . BRONCHIAL BIOPSY  01/12/2020   Procedure: BRONCHIAL BIOPSIES;  Surgeon: Garner Nash, DO;  Location: [facility];  Service: Pulmonary;;  . BRONCHIAL BRUSHINGS  01/12/2020   Procedure: BRONCHIAL BRUSHINGS;  Surgeon: Garner Nash, DO;  Location: [facility];  Service: Pulmonary;;  . BRONCHIAL NEEDLE ASPIRATION BIOPSY  01/12/2020   Procedure: BRONCHIAL NEEDLE ASPIRATION BIOPSIES;  Surgeon: Garner Nash, DO;  Location: [facility];  Service: Pulmonary;;  . BRONCHIAL WASHINGS  01/12/2020   Procedure: BRONCHIAL WASHINGS;  Surgeon: Garner Nash, DO;  Location: [facility];  Service: Pulmonary;;  . SHOULDER SURGERY    . TONSILLECTOMY    . VIDEO BRONCHOSCOPY WITH ENDOBRONCHIAL ULTRASOUND  01/12/2020   Procedure: VIDEO BRONCHOSCOPY WITH ENDOBRONCHIAL ULTRASOUND;  Surgeon: Garner Nash, DO;  Location: [facility];  Service: Pulmonary;;    There were no vitals filed for this visit.                      [facility] Adult PT Treatment/Exercise - 07/19/20 0001      Transfers   Transfers Sit to Stand    Sit to Stand 4: Min guard    Number of Reps Other reps (comment)   5 reps    Comments with UE support from mat table to RW           Vestibular Treatment/Exercise - 07/19/20 0001      Vestibular Treatment/Exercise   Vestibular Treatment Provided Habituation    Habituation Exercises Legrand Como Daroff   Number of Reps  3    Symptom Description  c/o unsteadiness with sidelying to sitting              Balance Exercises - 07/19/20 0001      Balance Exercises: Standing   Other Standing Exercises Pt performed standing hip extension, abduction, and flexion 5 reps each leg due to fatigue; pt required seated rest periods between exercises.               PT Short Term Goals - 07/18/20 1030      PT SHORT TERM GOAL #1   Title Patient will be IND with initial HEP to improve dizziness and balance. TARGET DATE FOR ALL STGS: 07/14/20    Time 4    Period Weeks    Status New      PT SHORT TERM GOAL #2   Title Perform BERG and write goals as indicated.    Time 4    Period Weeks      PT SHORT TERM GOAL #3   Title Pt will improve gait speed with RW to >/=2.2ft/sec. to decr. falls risk.    Baseline 1.51ft/sec with RW    Time 4    Period Weeks    Status New      PT SHORT TERM GOAL #4   Title Complete vestibular exam and write goals as indicated.    Time 4    Period Weeks    Status New      PT SHORT TERM GOAL #5   Title Pt will amb. 300' over even terrain with LRAD at MOD I level to improve funcitonal mobility.    Time 4    Period Weeks             PT Long Term Goals - 07/19/20 2252      PT LONG TERM GOAL #1   Title Pt will report no falls in the last four weeks to improve safety. TARGET DATE FOR ALL LTGS: 08/11/20    Time 8    Period Weeks    Status New      PT LONG TERM GOAL #2   Title Pt will amb. 500' over even and paved surfaces at MOD I level with LRAD to improve functional mobility.    Time 8    Period Weeks    Status New      PT LONG TERM GOAL #3   Title Pt will report dizziness during all activities is </=2/10 to  improve safety and QOL.    Time 8    Period Weeks    Status New  Plan - 07/19/20 2247    Clinical Impression Statement No nystagmus noted with sit to sidelying positions, but pt continues to c/o light-headedness and unsteadiness with sidelying to sitting position. Symptoms appear to be related to BP changes; pt is wearing heart monitor and wishes to hold PT until results of monitor have been received.  Pt also c/o weakness and requires frequent seated rest periods with standing activities.  Hold PT due to minimal progress achieved with dizziness with positional changes and also due to pt's request.    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, Cardiomyopathy, HTN, L BBB, R PE, TIA, hypothyroidism, Kidney disease, anxiety, HLD, stress incontinence, OSA, Lx foraminal stenosis, B cataract extraction in May or June 2021-she feels like she needs long distance glasses    Examination-Activity Limitations Bed Mobility;Bend;Carry;Transfers;Locomotion Level;Squat;Lift;Toileting    Examination-Participation Restrictions Cleaning;Meal Prep;Interpersonal Relationship;Driving    Stability/Clinical Decision Making Unstable/Unpredictable    Rehab Potential Fair   based on stage 4 lung CA with mets to brain and R hip   PT Frequency 2x / week    PT Duration 8 weeks    PT Treatment/Interventions ADLs/Self Care Home Management;Biofeedback;Canalith Repostioning;DME Instruction;Gait training;Stair training;Functional mobility training;Therapeutic activities;Therapeutic exercise;Balance training;Neuromuscular re-education;Orthotic Fit/Training;Patient/family education;Joint Manipulations    PT Next Visit Plan Hold until results of heart monitor received    PT Home Exercise Plan Brandt-Daroff exercises    Consulted and Agree with Plan of Care  Patient           Patient will benefit from skilled therapeutic intervention in order to improve the following deficits and impairments:  Abnormal gait,Decreased endurance,Decreased knowledge of use of DME,Decreased balance,Decreased mobility,Decreased strength,Dizziness,Postural dysfunction  Visit Diagnosis: Dizziness and giddiness  Muscle weakness (generalized)  Other abnormalities of gait and mobility     Problem List Patient Active Problem List   Diagnosis Date Noted  . Recurrent falls 07/12/2020  . Sleep apnea   . NICM (nonischemic cardiomyopathy) ([place])   . Hypothyroidism   . GERD (gastroesophageal reflux disease)   . Blood clots in brain   . 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 Chronic kidney disease, stage 3a ([place]) 05/16/2020  . Chronic respiratory failure ([place]) 05/16/2020  . Hyperlipidemia 05/16/2020  . Insomnia 05/16/2020  . Long term (current) use of anticoagulants 05/16/2020  . Lung mass 05/16/2020  . 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[place]) 05/16/2020  . Major depression, single episode 05/16/2020  . Secondary malignant neoplasm of intrathoracic lymph nodes ([place]) 05/16/2020  . Rosacea 05/16/2020  . Stress incontinence (female) (female) 05/16/2020  . Single subsegmental pulmonary embolism without acute cor pulmonale ([place]) 03/25/2020  . Cough 03/18/2020  . Generalized weakness 03/18/2020  . 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[place]) 01/29/2020  . 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[place]) 01/28/2020  . Adenocarcinoma of left lung, stage 4 ([place]) 01/18/2020  . Encounter for antineoplastic chemotherapy 01/18/2020  . Goals of care, counseling/discussion 01/18/2020  . Malignant neoplasm of lower lobe, left bronchus or lung ([place]) 01/14/2020  . Acute CVA (cerebrovascular accident) ([place]) 01/09/2020  . Intracranial bleeding ([place]) 01/09/2020  . TIA (transient ischemic attack) 01/08/2020   . Lumbar foraminal stenosis 12/30/2019  . Dizziness 09/25/2019  . Obstructive sleep apnea syndrome   . Gastro-esophageal reflux disease without esophagitis   . Anxiety   . Nonobstructive cardiomyopathy ([place])   . Hypothyroidism, unspecified   .  Nonischemic cardiomyopathy ([place]) 06/04/2018  . Essential hypertension 06/04/2018  . Left bundle branch block 06/04/2018  . Dyslipidemia 06/04/2018    Alda Lea, PT 07/19/2020, 10:53 PM  [facility] [address] [place], [place], [address] Phone: [phone]   Fax:  [phone]  Name: Stefanie Camacho MRN: [id number] Date of Birth: [birth year]

## 2020-07-20 MED ORDER — OSIMERTINIB MESYLATE 80 MG PO TABS: 160.0000 mg | ORAL_TABLET | Freq: Every day | ORAL | 0 refills | Status: DC

## 2020-07-20 NOTE — Addendum Note (Signed)
Addended by: Ardeen Garland on: 07/20/2020 04:38 PM   Modules accepted: Orders

## 2020-07-20 NOTE — Telephone Encounter (Addendum)
Faxed and ecribed tagrisso refill.

## 2020-07-21 ENCOUNTER — Ambulatory Visit
Admission: RE | Admit: 2020-07-21 | Discharge: 2020-07-21 | Disposition: A | Discharge status: Home / Self Care | Payer: Medicare HMO | Source: Ambulatory Visit | Attending: Internal Medicine | Admitting: Internal Medicine

## 2020-07-21 DIAGNOSIS — C7949 Secondary malignant neoplasm of other parts of nervous system: Secondary | ICD-10-CM

## 2020-07-21 DIAGNOSIS — Z85118 Personal history of other malignant neoplasm of bronchus and lung: Secondary | ICD-10-CM | POA: Diagnosis not present

## 2020-07-21 DIAGNOSIS — R42 Dizziness and giddiness: Secondary | ICD-10-CM | POA: Diagnosis not present

## 2020-07-21 DIAGNOSIS — Z85841 Personal history of malignant neoplasm of brain: Secondary | ICD-10-CM | POA: Diagnosis not present

## 2020-07-21 DIAGNOSIS — C7931 Secondary malignant neoplasm of brain: Secondary | ICD-10-CM

## 2020-07-21 DIAGNOSIS — R531 Weakness: Secondary | ICD-10-CM | POA: Diagnosis not present

## 2020-07-21 MED ORDER — GADOBENATE DIMEGLUMINE 529 MG/ML IV SOLN
15.0000 mL | Freq: Once | INTRAVENOUS | Status: AC | PRN
  Administered 2020-07-21: 15 mL via INTRAVENOUS

## 2020-07-22 ENCOUNTER — Ambulatory Visit: Payer: Medicare HMO | Admitting: Physical Therapy

## 2020-07-22 DIAGNOSIS — R002 Palpitations: Secondary | ICD-10-CM | POA: Diagnosis not present

## 2020-07-22 DIAGNOSIS — I447 Left bundle-branch block, unspecified: Secondary | ICD-10-CM | POA: Diagnosis not present

## 2020-07-25 ENCOUNTER — Inpatient Hospital Stay: Payer: Medicare HMO

## 2020-07-25 ENCOUNTER — Other Ambulatory Visit: Payer: Self-pay

## 2020-07-25 ENCOUNTER — Inpatient Hospital Stay: Payer: Medicare HMO | Attending: Internal Medicine | Admitting: Internal Medicine

## 2020-07-25 ENCOUNTER — Ambulatory Visit: Payer: Medicare HMO | Admitting: Physical Therapy

## 2020-07-25 ENCOUNTER — Telehealth: Payer: Self-pay | Admitting: *Deleted

## 2020-07-25 VITALS — BP 97/61 | HR 74 | Temp 97.2°F | Resp 15 | Ht 65.5 in | Wt 149.1 lb

## 2020-07-25 DIAGNOSIS — E860 Dehydration: Secondary | ICD-10-CM | POA: Insufficient documentation

## 2020-07-25 DIAGNOSIS — C3432 Malignant neoplasm of lower lobe, left bronchus or lung: Secondary | ICD-10-CM | POA: Diagnosis not present

## 2020-07-25 DIAGNOSIS — C7931 Secondary malignant neoplasm of brain: Secondary | ICD-10-CM | POA: Insufficient documentation

## 2020-07-25 DIAGNOSIS — Z923 Personal history of irradiation: Secondary | ICD-10-CM | POA: Diagnosis not present

## 2020-07-25 DIAGNOSIS — Z9221 Personal history of antineoplastic chemotherapy: Secondary | ICD-10-CM | POA: Insufficient documentation

## 2020-07-25 DIAGNOSIS — C7949 Secondary malignant neoplasm of other parts of nervous system: Secondary | ICD-10-CM | POA: Diagnosis not present

## 2020-07-25 DIAGNOSIS — C3492 Malignant neoplasm of unspecified part of left bronchus or lung: Secondary | ICD-10-CM

## 2020-07-25 DIAGNOSIS — Z86711 Personal history of pulmonary embolism: Secondary | ICD-10-CM | POA: Diagnosis not present

## 2020-07-25 DIAGNOSIS — C7951 Secondary malignant neoplasm of bone: Secondary | ICD-10-CM | POA: Diagnosis not present

## 2020-07-25 DIAGNOSIS — Z7901 Long term (current) use of anticoagulants: Secondary | ICD-10-CM | POA: Diagnosis not present

## 2020-07-25 DIAGNOSIS — Z79899 Other long term (current) drug therapy: Secondary | ICD-10-CM | POA: Insufficient documentation

## 2020-07-25 LAB — URINALYSIS, COMPLETE (UACMP) WITH MICROSCOPIC
Bilirubin Urine: NEGATIVE
Glucose, UA: NEGATIVE mg/dL
Ketones, ur: 5 mg/dL — AB
Nitrite: POSITIVE — AB
Protein, ur: 30 mg/dL — AB
Specific Gravity, Urine: 1.024 (ref 1.005–1.030)
WBC, UA: >50 WBC/hpf — ABNORMAL HIGH (ref 0–5)
pH: 5 (ref 5.0–8.0)

## 2020-07-25 MED ORDER — CIPROFLOXACIN HCL 500 MG PO TABS: 500.0000 mg | ORAL_TABLET | Freq: Two times a day (BID) | ORAL | 0 refills | Status: DC

## 2020-07-25 NOTE — Telephone Encounter (Signed)
Received call from patient's daughter, Santiago Glad. She states that she thinks her mother has a UTI and is dehydrated  Received call from pt's daughter today, Santiago Glad. Santiago Glad states she thiinks her mother might have a UTI and may be dehydrated. She sees Dr. Julien Nordmann tomorrow and is wondering if patient can get IV fluids tomorrow. She is also seeing Dr. Mickeal Skinner this morning.  Dr. Renda Rolls nurse nurse made aware of the above situation/concerns

## 2020-07-25 NOTE — Progress Notes (Signed)
[facility] [place], [place] [address] [phone]   Interval Evaluation  Date of Service: 07/25/20 Patient Name: Stefanie Camacho Patient MRN: [id number] Patient DOB: October 28, 1945 Provider: Ventura Sellers, MD  Identifying Statement:  Stefanie Camacho is a 75 y.o. 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    05/25/2020 Cancer Staging   Staging form: Lung, AJCC 8th Edition - Clinical: Stage IVB (cT3, cN2, cM1c) - Signed by Curt Bears, MD on 05/25/2020    CNS Oncologic History 02/02/20: Completes WBRT with Dr. Lisbeth Camacho  Interval History:  Stefanie Camacho presents today for follow up after recent MRI brain.  No recurrence slurred speech episodes, or marching numbness.  Still describes generalized fatigue symptoms.  Does complain of occassional bouts of vertigo which are positional in nature; these go back years prior to cancer diagnosis.  Does describe burning with urination and foul smell, recently completed antibiotic course but symptoms recurred.  Continues to take Keppra 251m BID, seizure free. Still [place]. [place]ien Nordmann    H+P (01/29/20) Patient presented to medical attention on 01/09/20 with sudden onset "marching" left sided facial numbness.  The episode drew suspicion for stroke, and during workup patient was found to have innumerable brain metastases with additional foci of leptomeningeal enhancement secondary to widely metastatic lung cancer.  She returned to baseline and was discharged with KKinmundy brain disease has since been treated with whole brain radiotherapy with Dr. MLisbeth Camacho she is scheduled to  complete treatment next week.  She has difficulty with ambulation because of a hip metastasis, but since being treated with RT that pain has improved as has her walking.  Plans to meet with Dr. MJulien Nordmannnext week to discuss plans for therapeutic targeting of EGFR mutation.    Medications: Current Outpatient Medications on File Prior to Visit  Medication Sig Dispense Refill  . acetaminophen (TYLENOL) 500 MG tablet Take 1,000 mg by mouth every 6 (six) hours as needed for mild pain.    .Marland Kitchenapixaban (ELIQUIS) 5 MG TABS tablet Take 1 tablet (5 mg total) by mouth 2 (two) times daily. 90 tablet 1  . atorvastatin (LIPITOR) 10 MG tablet Take 1 tablet (10 mg total) by mouth daily at 6 PM. 90 tablet 3  . cephALEXin (KEFLEX) 500 MG capsule Take 500 mg by mouth in the morning and at bedtime.    .Marland KitchenFLUoxetine (PROZAC) 20 MG capsule Take 20 mg by mouth daily.    .Marland KitchenlevETIRAcetam (KEPPRA) 500 MG tablet Take 1 tablet by mouth twice daily 60 tablet 0  . levothyroxine (SYNTHROID) 75 MCG tablet Take 1 tablet (75 mcg total) by mouth daily. 30 tablet 1  . loperamide (IMODIUM) 2 MG capsule Take 2 mg by mouth as needed for diarrhea or loose stools.    .Marland Kitchenlosartan (COZAAR) 25 MG tablet Take 1 tablet (25 mg total) by mouth daily. 90 tablet 3  . metoprolol succinate (TOPROL-XL) 50 MG 24 hr tablet Take 1 tablet (50 mg total) by mouth daily. Take with or immediately following a meal. 90 tablet 2  . osimertinib mesylate (TAGRISSO) 80 MG tablet Take 2 tablets (160 mg total) by mouth  daily. Days 1-28. 60 tablet 0  . pantoprazole (PROTONIX) 40 MG tablet Take 1 tablet by mouth once daily 30 tablet 0  . prochlorperazine (COMPAZINE) 10 MG tablet Take 1 tablet (10 mg total) by mouth every 6 (six) hours as needed for nausea or vomiting. 45 tablet 5  . spironolactone (ALDACTONE) 25 MG tablet Take 0.5 tablets (12.5 mg total) by mouth daily. 45 tablet 3   No current facility-administered medications on file prior to visit.    Allergies:   Allergies  Allergen Reactions  . Sertraline Hcl Other (See Comments)   Past Medical History:  Past Medical History:  Diagnosis Date  . Acute CVA (cerebrovascular accident) ([place]) 01/09/2020  . Adenocarcinoma of left lung, stage 4 ([place]) 01/18/2020  . Anxiety   . Blood clots in brain   . 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 Chronic kidney disease, stage 3a ([place]) 05/16/2020  . Chronic respiratory failure ([place]) 05/16/2020  . Cough 03/18/2020  . Daytime somnolence 05/16/2020  . Dyslipidemia   . Encounter for antineoplastic chemotherapy 01/18/2020  . Essential hypertension   . Gastro-esophageal reflux disease without esophagitis   . Generalized weakness 03/18/2020  . GERD (gastroesophageal reflux disease)   . Goals of care, counseling/discussion 01/18/2020  . Hyperlipidemia 05/16/2020  . Hypothyroidism   . Hypothyroidism, unspecified   . Insomnia 05/16/2020  . Intracranial bleeding ([place]) 01/09/2020  . Left bundle branch block 06/04/2018  . 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 Long term (current) use of anticoagulants 05/16/2020  . Lumbar foraminal stenosis 12/30/2019  . Lung mass 05/16/2020  . 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[place]) 05/16/2020  . Major depression, single episode 05/16/2020  . Malignant neoplasm of lower lobe, left bronchus or lung ([place]) 01/14/2020  . NICM (nonischemic cardiomyopathy) ([place])   . Nonischemic cardiomyopathy ([place]) 06/04/2018   Ejection fraction 45% in summer 2019  . Nonobstructive cardiomyopathy ([place])   . Obstructive sleep apnea syndrome   . Rosacea 05/16/2020  . 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 Single subsegmental pulmonary embolism without acute cor pulmonale ([place]) 03/25/2020  . Sleep apnea   . Stress incontinence (female) (female) 05/16/2020  . TIA (transient ischemic  attack) 01/08/2020  . Vertigo 09/25/2019   Past Surgical History:  Past Surgical History:  Procedure Laterality Date  . ABDOMINAL HYSTERECTOMY    . BLADDER NECK RECONSTRUCTION    . BRONCHIAL BIOPSY  01/12/2020   Procedure: BRONCHIAL BIOPSIES;  Surgeon: Garner Nash, DO;  Location: [facility];  Service: Pulmonary;;  . BRONCHIAL BRUSHINGS  01/12/2020   Procedure: BRONCHIAL BRUSHINGS;  Surgeon: Garner Nash, DO;  Location: [facility];  Service: Pulmonary;;  . BRONCHIAL NEEDLE ASPIRATION BIOPSY  01/12/2020   Procedure: BRONCHIAL NEEDLE ASPIRATION BIOPSIES;  Surgeon: Garner Nash, DO;  Location: [facility];  Service: Pulmonary;;  . BRONCHIAL WASHINGS  01/12/2020   Procedure: BRONCHIAL WASHINGS;  Surgeon: Garner Nash, DO;  Location: [facility];  Service: Pulmonary;;  . SHOULDER SURGERY    . TONSILLECTOMY    . VIDEO BRONCHOSCOPY WITH ENDOBRONCHIAL ULTRASOUND  01/12/2020   Procedure: VIDEO BRONCHOSCOPY WITH ENDOBRONCHIAL ULTRASOUND;  Surgeon: Garner Nash, DO;  Location: [facility];  Service: Pulmonary;;   Social History:  Social History   Socioeconomic History  . Marital status: Divorced    Spouse name: Not on file  . Number of  children: Not on file  . Years of education: Not on file  . Highest education level: Not on file  Occupational History  . Not on file  Tobacco Use  . Smoking status: Never Smoker  . Smokeless tobacco: Never Used  Vaping Use  . Vaping Use: Never used  Substance and Sexual Activity  . Alcohol use: Not Currently  . Drug use: Never  . Sexual activity: Not on file  Other Topics Concern  . Not on file  Social History Narrative  . Not on file   Social Determinants of Health   Financial Resource Strain: Not on file  Food Insecurity: Not on file  Transportation Needs: Not on file  Physical Activity: Not on file  Stress: Not on file  Social Connections: Not on file  Intimate Partner Violence: Not on file   Family History:  Family  History  Problem Relation Age of Onset  . Heart disease Mother   . Stroke Mother   . Heart disease Father   . Stroke Father   . Heart disease Maternal Grandfather   . Heart disease Paternal Grandfather     Review of Systems: Constitutional: Doesn't report fevers, chills or abnormal weight loss Eyes: Doesn't report blurriness of vision Ears, nose, mouth, throat, and face: Doesn't report sore throat Respiratory: Doesn't report cough, dyspnea or wheezes Cardiovascular: Doesn't report palpitation, chest discomfort  Gastrointestinal:  Doesn't report nausea, constipation, diarrhea GU: +dysuria Skin: Doesn't report skin rashes Neurological: Per HPI Musculoskeletal: Doesn't report joint pain Behavioral/Psych: Doesn't report anxiety  Physical Exam: Vitals:   07/25/20 1244  BP: 97/61  Pulse: 74  Resp: 15  Temp: (!) 97.2 F (36.2 C)  SpO2: 95%   KPS: 80. General: Alert, cooperative, pleasant, in no acute distress Head: Normal EENT: No conjunctival injection or scleral icterus.  Lungs: Resp effort normal Cardiac: Regular rate Abdomen: Non-distended abdomen Skin: No rashes cyanosis or petechiae. Extremities: No clubbing or edema  Neurologic Exam: Mental Status: Awake, alert, attentive to examiner. Oriented to self and environment. Language is fluent with intact comprehension.  Cranial Nerves: Visual acuity is grossly normal. Visual fields are full. Extra-ocular movements intact. No ptosis. Face is symmetric Motor: Tone and bulk are normal. Power is full in both arms and legs. Reflexes are symmetric, no pathologic reflexes present.  Sensory: Intact to light touch Gait: Orthopedic limitation  Labs: I have reviewed the data as listed    Component Value Date/Time   NA 139 06/24/2020 1327   K 3.6 06/24/2020 1327   CL 107 06/24/2020 1327   CO2 19 (L) 06/24/2020 1327   GLUCOSE 122 (H) 06/24/2020 1327   BUN 23 06/24/2020 1327   CREATININE 1.20 (H) 06/24/2020 1327   CALCIUM  10.0 06/24/2020 1327   PROT 7.3 06/24/2020 1327   PROT 6.9 07/17/2018 0934   ALBUMIN 4.0 06/24/2020 1327   ALBUMIN 4.1 07/17/2018 0934   AST 11 (L) 06/24/2020 1327   ALT 6 06/24/2020 1327   ALKPHOS 102 06/24/2020 1327   BILITOT 0.6 06/24/2020 1327   GFRNONAA 48 (L) 06/24/2020 1327   GFRAA >60 02/01/2020 1419   Lab Results  Component Value Date   WBC 6.7 06/24/2020   NEUTROABS 5.2 06/24/2020   HGB 12.6 06/24/2020   HCT 39.3 06/24/2020   MCV 93.8 06/24/2020   PLT 163 06/24/2020   Imaging:  [facility] Clinician Interpretation: I have personally reviewed the CNS images as listed.  My interpretation, in the context of the patient's clinical presentation, is  stable disease  MR BRAIN W WO CONTRAST  Result Date: 07/22/2020 CLINICAL DATA:  New onset dizziness for 3 months with right-sided weakness. History of lung cancer with brain and leptomeningeal metastases. Whole-brain radiotherapy in 2021 EXAM: MRI HEAD WITHOUT AND WITH CONTRAST TECHNIQUE: Multiplanar, multiecho pulse sequences of the brain and surrounding structures were obtained without and with intravenous contrast. CONTRAST:  49m MULTIHANCE GADOBENATE DIMEGLUMINE 529 MG/ML IV SOLN COMPARISON:  04/22/2020 FINDINGS: Brain: Enhancing intracranial metastatic disease that is primarily leptomeningeal and stable in extent/thickness. Rounded areas of more parenchymal appearing metastatic disease in the inferior left temporal lobe measuring 7 mm on 11:56 and in the left inferior occipito temporal convexity on 11:49 also measuring 7 mm. These nodular areas are stable to slightly decreased in size. No complicating infarct, acute hemorrhage, hydrocephalus, or shift. Stable mild gliosis in the white matter. Vascular: Normal flow voids and vascular enhancements. Skull and upper cervical spine: Stable 17 mm right parietal lesion. Hyperostosis interna. Sinuses/Orbits: Interval partial bilateral mastoid opacification, likely treatment related. IMPRESSION: 1.  Parenchymal and leptomeningeal metastatic disease without new or progressive disease since December 2021. 2. No progression of osseous metastatic disease. Electronically Signed   By: JMonte FantasiaM.D.   On: 07/22/2020 06:47    Assessment/Plan Brain Metastases Leptomeningeal Metastases  Gracianna TZollingeris clinically stable today.  MRI demonstrates good control of parenchymal and leptomeningeal disease burden.  We will run urinalysis and reflex culture today given urinary complaints; will plan to prescribed ciprofloxacin 5046mBID if c/w urethritis.    Vertigo symptoms are unlikely driven by CNS process or LMD, use Meclizine PRN as prior.  Will con't Keppra 25052mID.  We appreciate the opportunity to participate in the care of VicLuane Rochon  We ask that VicJocilynn Gradeturn to clinic in 3 months following next brain MRI, or sooner as needed.  All questions were answered. The patient knows to call the clinic with any problems, questions or concerns. No barriers to learning were detected.  The total time spent in the encounter was 30 minutes and more than 50% was on counseling and review of test results   ZacVentura SellersD Medical Director of Neuro-Oncology [facility] [place]/21/22 12:50 PM

## 2020-07-25 NOTE — Telephone Encounter (Signed)
Please reschedule her for UA and IV fluid if there is any room available tomorrow.

## 2020-07-26 ENCOUNTER — Other Ambulatory Visit: Payer: Self-pay | Admitting: Internal Medicine

## 2020-07-26 ENCOUNTER — Inpatient Hospital Stay: Payer: Medicare HMO

## 2020-07-26 ENCOUNTER — Other Ambulatory Visit: Payer: Self-pay | Admitting: Physician Assistant

## 2020-07-26 ENCOUNTER — Ambulatory Visit: Payer: Medicare HMO

## 2020-07-26 ENCOUNTER — Other Ambulatory Visit: Payer: Self-pay | Admitting: Cardiology

## 2020-07-26 ENCOUNTER — Ambulatory Visit: Payer: Medicare HMO | Admitting: Internal Medicine

## 2020-07-26 ENCOUNTER — Inpatient Hospital Stay: Payer: Medicare HMO | Admitting: Internal Medicine

## 2020-07-26 VITALS — BP 104/63 | HR 78 | Temp 98.2°F | Resp 15 | Ht 65.5 in | Wt 148.7 lb

## 2020-07-26 DIAGNOSIS — C3492 Malignant neoplasm of unspecified part of left bronchus or lung: Secondary | ICD-10-CM

## 2020-07-26 DIAGNOSIS — Z79899 Other long term (current) drug therapy: Secondary | ICD-10-CM | POA: Diagnosis not present

## 2020-07-26 DIAGNOSIS — C7949 Secondary malignant neoplasm of other parts of nervous system: Secondary | ICD-10-CM

## 2020-07-26 DIAGNOSIS — Z5111 Encounter for antineoplastic chemotherapy: Secondary | ICD-10-CM

## 2020-07-26 DIAGNOSIS — E860 Dehydration: Secondary | ICD-10-CM

## 2020-07-26 DIAGNOSIS — Z86711 Personal history of pulmonary embolism: Secondary | ICD-10-CM | POA: Diagnosis not present

## 2020-07-26 DIAGNOSIS — C3432 Malignant neoplasm of lower lobe, left bronchus or lung: Secondary | ICD-10-CM

## 2020-07-26 DIAGNOSIS — R002 Palpitations: Secondary | ICD-10-CM

## 2020-07-26 DIAGNOSIS — Z9221 Personal history of antineoplastic chemotherapy: Secondary | ICD-10-CM | POA: Diagnosis not present

## 2020-07-26 DIAGNOSIS — C7931 Secondary malignant neoplasm of brain: Secondary | ICD-10-CM | POA: Diagnosis not present

## 2020-07-26 DIAGNOSIS — C7932 Secondary malignant neoplasm of cerebral meninges: Secondary | ICD-10-CM

## 2020-07-26 DIAGNOSIS — I10 Essential (primary) hypertension: Secondary | ICD-10-CM | POA: Diagnosis not present

## 2020-07-26 DIAGNOSIS — Z7901 Long term (current) use of anticoagulants: Secondary | ICD-10-CM | POA: Diagnosis not present

## 2020-07-26 DIAGNOSIS — I447 Left bundle-branch block, unspecified: Secondary | ICD-10-CM

## 2020-07-26 DIAGNOSIS — C7951 Secondary malignant neoplasm of bone: Secondary | ICD-10-CM | POA: Diagnosis not present

## 2020-07-26 DIAGNOSIS — Z923 Personal history of irradiation: Secondary | ICD-10-CM | POA: Diagnosis not present

## 2020-07-26 LAB — CBC WITH DIFFERENTIAL (CANCER CENTER ONLY)
Abs Immature Granulocytes: 0.01 10*3/uL (ref 0.00–0.07)
Basophils Absolute: 0 10*3/uL (ref 0.0–0.1)
Basophils Relative: 0 %
Eosinophils Absolute: 0.1 10*3/uL (ref 0.0–0.5)
Eosinophils Relative: 1 %
HCT: 38.9 % (ref 36.0–46.0)
Hemoglobin: 12.8 g/dL (ref 12.0–15.0)
Immature Granulocytes: 0 %
Lymphocytes Relative: 20 %
Lymphs Abs: 1 10*3/uL (ref 0.7–4.0)
MCH: 30.5 pg (ref 26.0–34.0)
MCHC: 32.9 g/dL (ref 30.0–36.0)
MCV: 92.8 fL (ref 80.0–100.0)
Monocytes Absolute: 0.5 10*3/uL (ref 0.1–1.0)
Monocytes Relative: 9 %
Neutro Abs: 3.3 10*3/uL (ref 1.7–7.7)
Neutrophils Relative %: 70 %
Platelet Count: 140 10*3/uL — ABNORMAL LOW (ref 150–400)
RBC: 4.19 MIL/uL (ref 3.87–5.11)
RDW: 14.5 % (ref 11.5–15.5)
WBC Count: 4.8 10*3/uL (ref 4.0–10.5)
nRBC: 0 % (ref 0.0–0.2)

## 2020-07-26 LAB — CMP (CANCER CENTER ONLY)
ALT: 16 U/L (ref 0–44)
AST: 13 U/L — ABNORMAL LOW (ref 15–41)
Albumin: 3.7 g/dL (ref 3.5–5.0)
Alkaline Phosphatase: 113 U/L (ref 38–126)
Anion gap: 9 (ref 5–15)
BUN: 20 mg/dL (ref 8–23)
CO2: 23 mmol/L (ref 22–32)
Calcium: 10 mg/dL (ref 8.9–10.3)
Chloride: 107 mmol/L (ref 98–111)
Creatinine: 1.06 mg/dL — ABNORMAL HIGH (ref 0.44–1.00)
GFR, Estimated: 55 mL/min — ABNORMAL LOW (ref >60)
Glucose, Bld: 129 mg/dL — ABNORMAL HIGH (ref 70–99)
Potassium: 3.7 mmol/L (ref 3.5–5.1)
Sodium: 139 mmol/L (ref 135–145)
Total Bilirubin: 0.5 mg/dL (ref 0.3–1.2)
Total Protein: 6.8 g/dL (ref 6.5–8.1)

## 2020-07-26 MED ORDER — SODIUM CHLORIDE 0.9 % IV SOLN
Freq: Once | INTRAVENOUS | Status: AC
  Filled 2020-07-26: qty 250

## 2020-07-26 NOTE — Patient Instructions (Signed)
Rehydration, Adult Rehydration is the replacement of body fluids, salts, and minerals (electrolytes) that are lost during dehydration. Dehydration is when there is not enough water or other fluids in the body. This happens when you lose more fluids than you take in. Common causes of dehydration include:  Not drinking enough fluids. This can occur when you are ill or doing activities that require a lot of energy, especially in hot weather.  Conditions that cause loss of water or other fluids, such as diarrhea, vomiting, sweating, or urinating a lot.  Other illnesses, such as fever or infection.  Certain medicines, such as those that remove excess fluid from the body (diuretics). Symptoms of mild or moderate dehydration may include thirst, dry lips and mouth, and dizziness. Symptoms of severe dehydration may include increased heart rate, confusion, fainting, and not urinating. For severe dehydration, you may need to get fluids through an IV at the hospital. For mild or moderate dehydration, you can usually rehydrate at home by drinking certain fluids as told by your health care provider. What are the risks? Generally, rehydration is safe. However, taking in too much fluid (overhydration) can be a problem. This is rare. Overhydration can cause an electrolyte imbalance, kidney failure, or a decrease in salt (sodium) levels in the body. Supplies needed You will need an oral rehydration solution (ORS) if your health care provider tells you to use one. This is a drink to treat dehydration. It can be found in pharmacies and retail stores. How to rehydrate Fluids Follow instructions from your health care provider for rehydration. The kind of fluid and the amount you should drink depend on your condition. In general, you should choose drinks that you prefer.  If told by your health care provider, drink an ORS. ? Make an ORS by following instructions on the package. ? Start by drinking small amounts,  about  cup (120 mL) every 5-10 minutes. ? Slowly increase how much you drink until you have taken the amount recommended by your health care provider.  Drink enough clear fluids to keep your urine pale yellow. If you were told to drink an ORS, finish it first, then start slowly drinking other clear fluids. Drink fluids such as: ? Water. This includes sparkling water and flavored water. Drinking only water can lead to having too little sodium in your body (hyponatremia). Follow the advice of your health care provider. ? Water from ice chips you suck on. ? Fruit juice with water you add to it (diluted). ? Sports drinks. ? Hot or cold herbal teas. ? Broth-based soups. ? Milk or milk products. Food Follow instructions from your health care provider about what to eat while you rehydrate. Your health care provider may recommend that you slowly begin eating regular foods in small amounts.  Eat foods that contain a healthy balance of electrolytes, such as bananas, oranges, potatoes, tomatoes, and spinach.  Avoid foods that are greasy or contain a lot of sugar. In some cases, you may get nutrition through a feeding tube that is passed through your nose and into your stomach (nasogastric tube, or NG tube). This may be done if you have uncontrolled vomiting or diarrhea.   Beverages to avoid Certain beverages may make dehydration worse. While you rehydrate, avoid drinking alcohol.   How to tell if you are recovering from dehydration You may be recovering from dehydration if:  You are urinating more often than before you started rehydrating.  Your urine is pale yellow.  Your energy level   improves.  You vomit less frequently.  You have diarrhea less frequently.  Your appetite improves or returns to normal.  You feel less dizzy or less light-headed.  Your skin tone and color start to look more normal. Follow these instructions at home:  Take over-the-counter and prescription medicines only  as told by your health care provider.  Do not take sodium tablets. Doing this can lead to having too much sodium in your body (hypernatremia). Contact a health care provider if:  You continue to have symptoms of mild or moderate dehydration, such as: ? Thirst. ? Dry lips. ? Slightly dry mouth. ? Dizziness. ? Dark urine or less urine than normal. ? Muscle cramps.  You continue to vomit or have diarrhea. Get help right away if you:  Have symptoms of dehydration that get worse.  Have a fever.  Have a severe headache.  Have been vomiting and the following happens: ? Your vomiting gets worse or does not go away. ? Your vomit includes blood or green matter (bile). ? You cannot eat or drink without vomiting.  Have problems with urination or bowel movements, such as: ? Diarrhea that gets worse or does not go away. ? Blood in your stool (feces). This may cause stool to look black and tarry. ? Not urinating, or urinating only a small amount of very dark urine, within 6-8 hours.  Have trouble breathing.  Have symptoms that get worse with treatment. These symptoms may represent a serious problem that is an emergency. Do not wait to see if the symptoms will go away. Get medical help right away. Call your local emergency services (911 in the [place] Do not drive yourself to the hospital. Summary  Rehydration is the replacement of body fluids and minerals (electrolytes) that are lost during dehydration.  Follow instructions from your health care provider for rehydration. The kind of fluid and amount you should drink depend on your condition.  Slowly increase how much you drink until you have taken the amount recommended by your health care provider.  Contact your health care provider if you continue to show signs of mild or moderate dehydration. This information is not intended to replace advice given to you by your health care provider. Make sure you discuss any questions you have with  your health care provider. Document Revised: 06/24/2019 Document Reviewed: 05/04/2019 Elsevier Patient Education  2021 Elsevier Inc.  

## 2020-07-26 NOTE — Progress Notes (Signed)
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 2) pulmonary embolus in the right main pulmonary artery diagnosed in November 2021.  Biomarker Findings Microsatellite status - MS-Stable Tumor Mutational Burden - 1 Muts/Mb Genomic Findings For a complete list of the genes assayed, please refer to the Appendix. EGFR exon 19 deletion ([id number]) 7 Disease relevant genes with no reportable alterations: ALK, BRAF, ERBB2, KRAS, MET, RET, ROS1  PDL1 expression: 2%.  PRIOR THERAPY: Whole brain radiation and radiotherapy to the osseous metastases under the care of Dr. Lisbeth Renshaw. Last treatment expected on 02/02/20  CURRENT THERAPY:  1) Tagrisso160 mg p.o. daily. First dose on9/27/21. Status post 6 months of treatment. 2) Eliquis 5 mg p.o. twice daily  INTERVAL HISTORY: Stefanie Camacho 75 y.o. female returns to the clinic today for follow-up visit accompanied by her daughter Santiago Glad.  The patient is feeling fine today with no concerning complaints except for fatigue and lack of appetite.  She has been tolerating her treatment with Tagrisso fairly well.  She denied having any current chest pain, shortness of breath, cough or hemoptysis.  She denied having any fever or chills.  She has no nausea, vomiting, diarrhea or constipation.  She has no headache or visual changes.  The patient has no significant weight loss or night sweats.  She is here today for evaluation and repeat blood work.  MEDICAL HISTORY: Past Medical History:  Diagnosis Date  .  Acute CVA (cerebrovascular accident) ([place]) 01/09/2020  . Adenocarcinoma of left lung, stage 4 ([place]) 01/18/2020  . Anxiety   . Blood clots in brain   . 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 Chronic kidney disease, stage 3a ([place]) 05/16/2020  . Chronic respiratory failure ([place]) 05/16/2020  . Cough 03/18/2020  . Daytime somnolence 05/16/2020  . Dyslipidemia   . Encounter for antineoplastic chemotherapy 01/18/2020  . Essential hypertension   . Gastro-esophageal reflux disease without esophagitis   . Generalized weakness 03/18/2020  . GERD (gastroesophageal reflux disease)   . Goals of care, counseling/discussion 01/18/2020  . Hyperlipidemia 05/16/2020  . Hypothyroidism   . Hypothyroidism, unspecified   . Insomnia 05/16/2020  . Intracranial bleeding ([place]) 01/09/2020  . Left bundle branch block 06/04/2018  . 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 Long term (current) use of anticoagulants 05/16/2020  . Lumbar foraminal stenosis 12/30/2019  . Lung mass 05/16/2020  . 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[place]) 05/16/2020  . Major depression, single episode 05/16/2020  . Malignant neoplasm of lower lobe, left bronchus or lung ([place]) 01/14/2020  . NICM (nonischemic cardiomyopathy) ([place])   . Nonischemic cardiomyopathy ([place]) 06/04/2018   Ejection fraction 45% in summer 2019  . Nonobstructive cardiomyopathy ([place])   . Obstructive sleep apnea syndrome   . Rosacea 05/16/2020  . 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[place]) 01/28/2020  . 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 Single subsegmental pulmonary embolism without acute cor pulmonale ([place]) 03/25/2020  . Sleep apnea   . Stress incontinence (female) (female) 05/16/2020  .  TIA (transient ischemic attack) 01/08/2020  . Vertigo 09/25/2019    ALLERGIES:  is allergic to sertraline hcl.  MEDICATIONS:  Current Outpatient Medications   Medication Sig Dispense Refill  . acetaminophen (TYLENOL) 500 MG tablet Take 1,000 mg by mouth every 6 (six) hours as needed for mild pain.    Marland Kitchen apixaban (ELIQUIS) 5 MG TABS tablet Take 1 tablet (5 mg total) by mouth 2 (two) times daily. 90 tablet 1  . atorvastatin (LIPITOR) 10 MG tablet Take 1 tablet (10 mg total) by mouth daily at 6 PM. 90 tablet 3  . cephALEXin (KEFLEX) 500 MG capsule Take 500 mg by mouth in the morning and at bedtime. (Patient not taking: Reported on 07/25/2020)    . ciprofloxacin (CIPRO) 500 MG tablet Take 1 tablet (500 mg total) by mouth 2 (two) times daily. 14 tablet 0  . FLUoxetine (PROZAC) 20 MG capsule Take 20 mg by mouth daily.    Marland Kitchen levETIRAcetam (KEPPRA) 500 MG tablet Take 1 tablet by mouth twice daily 60 tablet 0  . levothyroxine (SYNTHROID) 75 MCG tablet Take 1 tablet (75 mcg total) by mouth daily. 30 tablet 1  . loperamide (IMODIUM) 2 MG capsule Take 2 mg by mouth as needed for diarrhea or loose stools.    Marland Kitchen losartan (COZAAR) 25 MG tablet Take 1 tablet (25 mg total) by mouth daily. 90 tablet 3  . metoprolol succinate (TOPROL-XL) 50 MG 24 hr tablet Take 1 tablet (50 mg total) by mouth daily. Take with or immediately following a meal. 90 tablet 2  . osimertinib mesylate (TAGRISSO) 80 MG tablet Take 2 tablets (160 mg total) by mouth daily. Days 1-28. (Patient not taking: Reported on 07/25/2020) 60 tablet 0  . pantoprazole (PROTONIX) 40 MG tablet Take 1 tablet by mouth once daily 30 tablet 0  . prochlorperazine (COMPAZINE) 10 MG tablet Take 1 tablet (10 mg total) by mouth every 6 (six) hours as needed for nausea or vomiting. 45 tablet 5  . spironolactone (ALDACTONE) 25 MG tablet Take 0.5 tablets (12.5 mg total) by mouth daily. 45 tablet 3   No current facility-administered medications for this visit.    SURGICAL HISTORY:  Past Surgical History:  Procedure Laterality Date  . ABDOMINAL HYSTERECTOMY    . BLADDER NECK RECONSTRUCTION    . BRONCHIAL BIOPSY  01/12/2020    Procedure: BRONCHIAL BIOPSIES;  Surgeon: Josephine Igo, DO;  Location: [facility];  Service: Pulmonary;;  . BRONCHIAL BRUSHINGS  01/12/2020   Procedure: BRONCHIAL BRUSHINGS;  Surgeon: Josephine Igo, DO;  Location: [facility];  Service: Pulmonary;;  . BRONCHIAL NEEDLE ASPIRATION BIOPSY  01/12/2020   Procedure: BRONCHIAL NEEDLE ASPIRATION BIOPSIES;  Surgeon: Josephine Igo, DO;  Location: [facility];  Service: Pulmonary;;  . BRONCHIAL WASHINGS  01/12/2020   Procedure: BRONCHIAL WASHINGS;  Surgeon: Josephine Igo, DO;  Location: [facility];  Service: Pulmonary;;  . SHOULDER SURGERY    . TONSILLECTOMY    . VIDEO BRONCHOSCOPY WITH ENDOBRONCHIAL ULTRASOUND  01/12/2020   Procedure: VIDEO BRONCHOSCOPY WITH ENDOBRONCHIAL ULTRASOUND;  Surgeon: Josephine Igo, DO;  Location: [facility];  Service: Pulmonary;;    REVIEW OF SYSTEMS:  A comprehensive review of systems was negative except for: Constitutional: positive for anorexia and fatigue   PHYSICAL EXAMINATION: General appearance: alert, cooperative, fatigued and no distress Head: Normocephalic, without obvious abnormality, atraumatic Neck: no adenopathy, no JVD, supple, symmetrical, trachea midline and thyroid not enlarged, symmetric, no tenderness/mass/nodules Lymph nodes: Cervical, supraclavicular, and axillary nodes normal.  Resp: clear to auscultation bilaterally Back: symmetric, no curvature. ROM normal. No CVA tenderness. Cardio: regular rate and rhythm, S1, S2 normal, no murmur, click, rub or gallop GI: soft, non-tender; bowel sounds normal; no masses,  no organomegaly Extremities: extremities normal, atraumatic, no cyanosis or edema  ECOG PERFORMANCE STATUS: 1 - Symptomatic but completely ambulatory  Blood pressure 104/63, pulse 78, temperature 98.2 F (36.8 C), temperature source Tympanic, resp. rate 15, height 5' 5.5" (1.664 m), weight 148 lb 11.2 oz (67.4 kg), SpO2 98 %.  LABORATORY DATA: Lab Results  Component Value  Date   WBC 4.8 07/26/2020   HGB 12.8 07/26/2020   HCT 38.9 07/26/2020   MCV 92.8 07/26/2020   PLT 140 (L) 07/26/2020      Chemistry      Component Value Date/Time   NA 139 06/24/2020 1327   K 3.6 06/24/2020 1327   CL 107 06/24/2020 1327   CO2 19 (L) 06/24/2020 1327   BUN 23 06/24/2020 1327   CREATININE 1.20 (H) 06/24/2020 1327      Component Value Date/Time   CALCIUM 10.0 06/24/2020 1327   ALKPHOS 102 06/24/2020 1327   AST 11 (L) 06/24/2020 1327   ALT 6 06/24/2020 1327   BILITOT 0.6 06/24/2020 1327       RADIOGRAPHIC STUDIES: MR BRAIN W WO CONTRAST  Result Date: 07/22/2020 CLINICAL DATA:  New onset dizziness for 3 months with right-sided weakness. History of lung cancer with brain and leptomeningeal metastases. Whole-brain radiotherapy in 2021 EXAM: MRI HEAD WITHOUT AND WITH CONTRAST TECHNIQUE: Multiplanar, multiecho pulse sequences of the brain and surrounding structures were obtained without and with intravenous contrast. CONTRAST:  60mL MULTIHANCE GADOBENATE DIMEGLUMINE 529 MG/ML IV SOLN COMPARISON:  04/22/2020 FINDINGS: Brain: Enhancing intracranial metastatic disease that is primarily leptomeningeal and stable in extent/thickness. Rounded areas of more parenchymal appearing metastatic disease in the inferior left temporal lobe measuring 7 mm on 11:56 and in the left inferior occipito temporal convexity on 11:49 also measuring 7 mm. These nodular areas are stable to slightly decreased in size. No complicating infarct, acute hemorrhage, hydrocephalus, or shift. Stable mild gliosis in the white matter. Vascular: Normal flow voids and vascular enhancements. Skull and upper cervical spine: Stable 17 mm right parietal lesion. Hyperostosis interna. Sinuses/Orbits: Interval partial bilateral mastoid opacification, likely treatment related. IMPRESSION: 1. Parenchymal and leptomeningeal metastatic disease without new or progressive disease since December 2021. 2. No progression of osseous  metastatic disease. Electronically Signed   By: Monte Fantasia M.D.   On: 07/22/2020 06:47    ASSESSMENT AND PLAN: This is a very pleasant 75 years old white female recently diagnosed with a stage IV non-small cell lung cancer, adenocarcinoma in September 2021 and presented with left lower lobe lung mass in addition to extensive bilateral pulmonary metastasis and anterior mediastinal lymphadenopathy as well as a skeletal metastasis and metastatic disease to the brain with leptomeningeal disease.  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 She underwent whole brain irradiation and currently undergoing treatment with targeted therapy with Tagrisso 160 mg p.o. daily because of the leptomeningeal disease status post 6 months of treatment.  The patient has been tolerating this treatment well with no concerning adverse effects. I recommended for the patient to continue her current treatment with Tagrisso with the same dose. For the leptomeningeal disease to have repeat MRI of the brain that showed no concerning findings for disease progression.  She is followed  by Dr. Mickeal Skinner. For the dehydration, I will arrange for the patient to receive 1 L of normal saline in the clinic today. For the history of pulmonary embolism she is currently on Eliquis. For the history of depression she will continue her current treatment with Prozac. The patient will come back for follow-up visit in 1 months for evaluation and repeat blood work. She was advised to call immediately if she has any concerning symptoms in the interval. The patient voices understanding of current disease status and treatment options and is in agreement with the current care plan.  All questions were answered. The patient knows to call the clinic with any problems, questions or concerns. We can certainly see the patient much sooner if necessary.  Disclaimer: This note was dictated with  voice recognition software. Similar sounding words can inadvertently be transcribed and may not be corrected upon review.

## 2020-07-27 ENCOUNTER — Ambulatory Visit: Payer: Medicare HMO | Admitting: Internal Medicine

## 2020-07-27 ENCOUNTER — Other Ambulatory Visit: Payer: Medicare HMO

## 2020-07-28 ENCOUNTER — Telehealth: Payer: Self-pay | Admitting: Emergency Medicine

## 2020-07-28 ENCOUNTER — Ambulatory Visit: Payer: Medicare HMO

## 2020-07-28 LAB — URINE CULTURE: Culture: >=100000 — AB

## 2020-07-28 MED ORDER — METOPROLOL SUCCINATE ER 50 MG PO TB24: 75.0000 mg | ORAL_TABLET | Freq: Every day | ORAL | 2 refills | Status: DC

## 2020-07-28 NOTE — Telephone Encounter (Signed)
Called patient. Informed her of results. She will increase metoprolol succinate to 75 mg daily.

## 2020-07-28 NOTE — Telephone Encounter (Signed)
-----   Message from Park Liter, MD sent at 07/28/2020 11:42 AM EDT ----- Monitor showed 2 episode of ventricular tachycardia short lasting.  We need to try to increase dose of metoprolol she takes 50 mg lets try to go to 75 mg daily

## 2020-08-01 ENCOUNTER — Ambulatory Visit: Payer: Medicare HMO | Admitting: Physical Therapy

## 2020-08-04 ENCOUNTER — Ambulatory Visit: Payer: Medicare HMO

## 2020-08-08 ENCOUNTER — Ambulatory Visit: Payer: Medicare HMO | Admitting: Physical Therapy

## 2020-08-08 ENCOUNTER — Other Ambulatory Visit: Payer: Self-pay | Admitting: Cardiology

## 2020-08-10 ENCOUNTER — Ambulatory Visit: Payer: Medicare HMO | Admitting: Cardiology

## 2020-08-11 ENCOUNTER — Encounter: Payer: Medicare HMO | Admitting: Physical Therapy

## 2020-08-15 ENCOUNTER — Ambulatory Visit: Payer: Medicare HMO | Admitting: Physical Therapy

## 2020-08-16 DIAGNOSIS — Z111 Encounter for screening for respiratory tuberculosis: Secondary | ICD-10-CM | POA: Diagnosis not present

## 2020-08-17 ENCOUNTER — Other Ambulatory Visit: Payer: Self-pay | Admitting: Medical Oncology

## 2020-08-17 DIAGNOSIS — C3492 Malignant neoplasm of unspecified part of left bronchus or lung: Secondary | ICD-10-CM

## 2020-08-17 MED ORDER — OSIMERTINIB MESYLATE 80 MG PO TABS: 160.0000 mg | ORAL_TABLET | Freq: Every day | ORAL | 0 refills | Status: DC

## 2020-08-17 NOTE — Telephone Encounter (Signed)
Tagrisso refill-LVM with Tagrisso refill for 80 mg tablets ( dose is 160 mg/day) and requested #60 tablets.  Request to call me back when they receive my VM.

## 2020-08-18 ENCOUNTER — Ambulatory Visit: Payer: Medicare HMO | Admitting: Physical Therapy

## 2020-08-18 DIAGNOSIS — Z111 Encounter for screening for respiratory tuberculosis: Secondary | ICD-10-CM | POA: Diagnosis not present

## 2020-08-19 ENCOUNTER — Telehealth: Payer: Self-pay | Admitting: Internal Medicine

## 2020-08-19 NOTE — Telephone Encounter (Signed)
R/s 4/21 appt due to provider schedule adjustments. Called and spoke with patient. Confirmed appt

## 2020-08-25 ENCOUNTER — Ambulatory Visit: Payer: Medicare HMO | Admitting: Internal Medicine

## 2020-08-25 ENCOUNTER — Other Ambulatory Visit: Payer: Medicare HMO

## 2020-08-26 ENCOUNTER — Inpatient Hospital Stay: Payer: Medicare HMO | Attending: Internal Medicine

## 2020-08-26 ENCOUNTER — Other Ambulatory Visit: Payer: Self-pay

## 2020-08-26 ENCOUNTER — Inpatient Hospital Stay: Payer: Medicare HMO | Admitting: Internal Medicine

## 2020-08-26 VITALS — BP 110/75 | HR 95 | Temp 97.9°F | Resp 18 | Ht 65.5 in | Wt 143.3 lb

## 2020-08-26 DIAGNOSIS — C3492 Malignant neoplasm of unspecified part of left bronchus or lung: Secondary | ICD-10-CM

## 2020-08-26 DIAGNOSIS — C7949 Secondary malignant neoplasm of other parts of nervous system: Secondary | ICD-10-CM | POA: Diagnosis not present

## 2020-08-26 DIAGNOSIS — C3432 Malignant neoplasm of lower lobe, left bronchus or lung: Secondary | ICD-10-CM

## 2020-08-26 DIAGNOSIS — Z7901 Long term (current) use of anticoagulants: Secondary | ICD-10-CM | POA: Diagnosis not present

## 2020-08-26 DIAGNOSIS — C3490 Malignant neoplasm of unspecified part of unspecified bronchus or lung: Secondary | ICD-10-CM | POA: Diagnosis not present

## 2020-08-26 DIAGNOSIS — Z79899 Other long term (current) drug therapy: Secondary | ICD-10-CM | POA: Insufficient documentation

## 2020-08-26 DIAGNOSIS — C7931 Secondary malignant neoplasm of brain: Secondary | ICD-10-CM | POA: Insufficient documentation

## 2020-08-26 DIAGNOSIS — Z5111 Encounter for antineoplastic chemotherapy: Secondary | ICD-10-CM | POA: Diagnosis not present

## 2020-08-26 DIAGNOSIS — Z86711 Personal history of pulmonary embolism: Secondary | ICD-10-CM | POA: Insufficient documentation

## 2020-08-26 DIAGNOSIS — C7951 Secondary malignant neoplasm of bone: Secondary | ICD-10-CM | POA: Insufficient documentation

## 2020-08-26 LAB — CBC WITH DIFFERENTIAL (CANCER CENTER ONLY)
Abs Immature Granulocytes: 0.01 10*3/uL (ref 0.00–0.07)
Basophils Absolute: 0 10*3/uL (ref 0.0–0.1)
Basophils Relative: 0 %
Eosinophils Absolute: 0.1 10*3/uL (ref 0.0–0.5)
Eosinophils Relative: 2 %
HCT: 40.9 % (ref 36.0–46.0)
Hemoglobin: 13.6 g/dL (ref 12.0–15.0)
Immature Granulocytes: 0 %
Lymphocytes Relative: 19 %
Lymphs Abs: 1 10*3/uL (ref 0.7–4.0)
MCH: 31.1 pg (ref 26.0–34.0)
MCHC: 33.3 g/dL (ref 30.0–36.0)
MCV: 93.4 fL (ref 80.0–100.0)
Monocytes Absolute: 0.2 10*3/uL (ref 0.1–1.0)
Monocytes Relative: 4 %
Neutro Abs: 3.9 10*3/uL (ref 1.7–7.7)
Neutrophils Relative %: 75 %
Platelet Count: 148 10*3/uL — ABNORMAL LOW (ref 150–400)
RBC: 4.38 MIL/uL (ref 3.87–5.11)
RDW: 13.7 % (ref 11.5–15.5)
WBC Count: 5.3 10*3/uL (ref 4.0–10.5)
nRBC: 0 % (ref 0.0–0.2)

## 2020-08-26 LAB — CMP (CANCER CENTER ONLY)
ALT: 17 U/L (ref 0–44)
AST: 17 U/L (ref 15–41)
Albumin: 4 g/dL (ref 3.5–5.0)
Alkaline Phosphatase: 124 U/L (ref 38–126)
Anion gap: 10 (ref 5–15)
BUN: 21 mg/dL (ref 8–23)
CO2: 21 mmol/L — ABNORMAL LOW (ref 22–32)
Calcium: 10.2 mg/dL (ref 8.9–10.3)
Chloride: 108 mmol/L (ref 98–111)
Creatinine: 1.34 mg/dL — ABNORMAL HIGH (ref 0.44–1.00)
GFR, Estimated: 42 mL/min — ABNORMAL LOW (ref >60)
Glucose, Bld: 128 mg/dL — ABNORMAL HIGH (ref 70–99)
Potassium: 3.7 mmol/L (ref 3.5–5.1)
Sodium: 139 mmol/L (ref 135–145)
Total Bilirubin: 0.8 mg/dL (ref 0.3–1.2)
Total Protein: 7.1 g/dL (ref 6.5–8.1)

## 2020-08-26 MED ORDER — OSIMERTINIB MESYLATE 80 MG PO TABS: 160.0000 mg | ORAL_TABLET | Freq: Every day | ORAL | 3 refills | Status: DC

## 2020-08-26 MED ORDER — METHYLPREDNISOLONE 4 MG PO TBPK: ORAL_TABLET | ORAL | 0 refills | Status: DC

## 2020-08-26 NOTE — Progress Notes (Signed)
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 2) pulmonary embolus in the right main pulmonary artery diagnosed in November 2021.  Biomarker Findings Microsatellite status - MS-Stable Tumor Mutational Burden - 1 Muts/Mb Genomic Findings For a complete list of the genes assayed, please refer to the Appendix. EGFR exon 19 deletion ([id number]) 7 Disease relevant genes with no reportable alterations: ALK, BRAF, ERBB2, KRAS, MET, RET, ROS1  PDL1 expression: 2%.  PRIOR THERAPY: Whole brain radiation and radiotherapy to the osseous metastases under the care of Dr. Lisbeth Renshaw. Last treatment expected on 02/02/20  CURRENT THERAPY:  1) Tagrisso160 mg p.o. daily. First dose on9/27/21. Status post 6 months of treatment. 2) Eliquis 5 mg p.o. twice daily  INTERVAL HISTORY: Stefanie Camacho 75 y.o. female returns to the clinic today for follow-up visit accompanied by her son.  The patient continues to complain of fatigue as well as lack of appetite and weight loss.  She denied having any current chest pain, shortness of breath, cough or hemoptysis.  She has intermittent nausea but no significant vomiting, diarrhea or constipation.  She has no headache or visual changes.  She is moving to a skilled nursing facility for rehabilitation.  She continues to tolerate her treatment with Tagrisso fairly well.  The patient is here today for evaluation and repeat blood work.  MEDICAL HISTORY: Past Medical History:  Diagnosis Date  . Acute CVA  (cerebrovascular accident) ([place]) 01/09/2020  . Adenocarcinoma of left lung, stage 4 ([place]) 01/18/2020  . Anxiety   . Blood clots in brain   . 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 Chronic kidney disease, stage 3a ([place]) 05/16/2020  . Chronic respiratory failure ([place]) 05/16/2020  . Cough 03/18/2020  . Daytime somnolence 05/16/2020  . Dyslipidemia   . Encounter for antineoplastic chemotherapy 01/18/2020  . Essential hypertension   . Gastro-esophageal reflux disease without esophagitis   . Generalized weakness 03/18/2020  . GERD (gastroesophageal reflux disease)   . Goals of care, counseling/discussion 01/18/2020  . Hyperlipidemia 05/16/2020  . Hypothyroidism   . Hypothyroidism, unspecified   . Insomnia 05/16/2020  . Intracranial bleeding ([place]) 01/09/2020  . Left bundle branch block 06/04/2018  . 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 Long term (current) use of anticoagulants 05/16/2020  . Lumbar foraminal stenosis 12/30/2019  . Lung mass 05/16/2020  . 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 Major depression, single episode 05/16/2020  . Malignant neoplasm of lower lobe, left bronchus or lung ([place]) 01/14/2020  . NICM (nonischemic cardiomyopathy) ([place])   . Nonischemic cardiomyopathy ([place]) 06/04/2018   Ejection fraction 45% in summer 2019  . Nonobstructive cardiomyopathy ([place])   . Obstructive sleep apnea syndrome   . Rosacea 05/16/2020  . 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[place]) 01/28/2020  . 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 Single subsegmental pulmonary embolism without acute cor pulmonale ([place]) 03/25/2020  . Sleep apnea   . Stress incontinence (female) (female) 05/16/2020  . TIA (transient ischemic attack)  01/08/2020  . Vertigo 09/25/2019    ALLERGIES:  is allergic to sertraline hcl.  MEDICATIONS:  Current Outpatient Medications  Medication Sig  Dispense Refill  . acetaminophen (TYLENOL) 500 MG tablet Take 1,000 mg by mouth every 6 (six) hours as needed for mild pain.    Marland Kitchen apixaban (ELIQUIS) 5 MG TABS tablet Take 1 tablet (5 mg total) by mouth 2 (two) times daily. 90 tablet 1  . atorvastatin (LIPITOR) 10 MG tablet Take 1 tablet (10 mg total) by mouth daily at 6 PM. 90 tablet 3  . cephALEXin (KEFLEX) 500 MG capsule Take 500 mg by mouth in the morning and at bedtime. (Patient not taking: Reported on 07/25/2020)    . ciprofloxacin (CIPRO) 500 MG tablet Take 1 tablet (500 mg total) by mouth 2 (two) times daily. 14 tablet 0  . FLUoxetine (PROZAC) 20 MG capsule Take 20 mg by mouth daily.    Marland Kitchen levETIRAcetam (KEPPRA) 500 MG tablet Take 1 tablet by mouth twice daily 60 tablet 0  . levothyroxine (SYNTHROID) 75 MCG tablet Take 1 tablet (75 mcg total) by mouth daily. 30 tablet 1  . loperamide (IMODIUM) 2 MG capsule Take 2 mg by mouth as needed for diarrhea or loose stools.    Marland Kitchen losartan (COZAAR) 25 MG tablet Take 1 tablet (25 mg total) by mouth daily. 90 tablet 3  . metoprolol succinate (TOPROL-XL) 50 MG 24 hr tablet Take 1.5 tablets (75 mg total) by mouth daily. Take with or immediately following a meal. 45 tablet 2  . osimertinib mesylate (TAGRISSO) 80 MG tablet Take 2 tablets (160 mg total) by mouth daily. Days 1-28. 60 tablet 0  . pantoprazole (PROTONIX) 40 MG tablet Take 1 tablet by mouth once daily 30 tablet 0  . prochlorperazine (COMPAZINE) 10 MG tablet Take 1 tablet (10 mg total) by mouth every 6 (six) hours as needed for nausea or vomiting. 45 tablet 5  . spironolactone (ALDACTONE) 25 MG tablet Take 0.5 tablets (12.5 mg total) by mouth daily. 45 tablet 3   No current facility-administered medications for this visit.    SURGICAL HISTORY:  Past Surgical History:  Procedure Laterality Date  . ABDOMINAL HYSTERECTOMY    . BLADDER NECK RECONSTRUCTION    . BRONCHIAL BIOPSY  01/12/2020   Procedure: BRONCHIAL BIOPSIES;  Surgeon: Garner Nash,  DO;  Location: [facility];  Service: Pulmonary;;  . BRONCHIAL BRUSHINGS  01/12/2020   Procedure: BRONCHIAL BRUSHINGS;  Surgeon: Garner Nash, DO;  Location: [facility];  Service: Pulmonary;;  . BRONCHIAL NEEDLE ASPIRATION BIOPSY  01/12/2020   Procedure: BRONCHIAL NEEDLE ASPIRATION BIOPSIES;  Surgeon: Garner Nash, DO;  Location: [facility];  Service: Pulmonary;;  . BRONCHIAL WASHINGS  01/12/2020   Procedure: BRONCHIAL WASHINGS;  Surgeon: Garner Nash, DO;  Location: [facility];  Service: Pulmonary;;  . SHOULDER SURGERY    . TONSILLECTOMY    . VIDEO BRONCHOSCOPY WITH ENDOBRONCHIAL ULTRASOUND  01/12/2020   Procedure: VIDEO BRONCHOSCOPY WITH ENDOBRONCHIAL ULTRASOUND;  Surgeon: Garner Nash, DO;  Location: [facility];  Service: Pulmonary;;    REVIEW OF SYSTEMS:  A comprehensive review of systems was negative except for: Constitutional: positive for anorexia, fatigue and weight loss Gastrointestinal: positive for nausea   PHYSICAL EXAMINATION: General appearance: alert, cooperative, fatigued and no distress Head: Normocephalic, without obvious abnormality, atraumatic Neck: no adenopathy, no JVD, supple, symmetrical, trachea midline and thyroid not enlarged, symmetric, no tenderness/mass/nodules Lymph nodes: Cervical, supraclavicular, and axillary nodes normal. Resp: clear to auscultation  bilaterally Back: symmetric, no curvature. ROM normal. No CVA tenderness. Cardio: regular rate and rhythm, S1, S2 normal, no murmur, click, rub or gallop GI: soft, non-tender; bowel sounds normal; no masses,  no organomegaly Extremities: extremities normal, atraumatic, no cyanosis or edema  ECOG PERFORMANCE STATUS: 1 - Symptomatic but completely ambulatory  Blood pressure 110/75, pulse 95, temperature 97.9 F (36.6 C), temperature source Tympanic, resp. rate 18, height 5' 5.5" (1.664 m), weight 143 lb 4.8 oz (65 kg), SpO2 98 %.  LABORATORY DATA: Lab Results  Component Value Date   WBC  5.3 08/26/2020   HGB 13.6 08/26/2020   HCT 40.9 08/26/2020   MCV 93.4 08/26/2020   PLT 148 (L) 08/26/2020      Chemistry      Component Value Date/Time   NA 139 08/26/2020 0913   K 3.7 08/26/2020 0913   CL 108 08/26/2020 0913   CO2 21 (L) 08/26/2020 0913   BUN 21 08/26/2020 0913   CREATININE 1.34 (H) 08/26/2020 0913      Component Value Date/Time   CALCIUM 10.2 08/26/2020 0913   ALKPHOS 124 08/26/2020 0913   AST 17 08/26/2020 0913   ALT 17 08/26/2020 0913   BILITOT 0.8 08/26/2020 0913       RADIOGRAPHIC STUDIES: No results found.  ASSESSMENT AND PLAN: This is a very pleasant 76 years old white female recently diagnosed with a stage IV non-small cell lung cancer, adenocarcinoma in September 2021 and presented with left lower lobe lung mass in addition to extensive bilateral pulmonary metastasis and anterior mediastinal lymphadenopathy as well as a skeletal metastasis and metastatic disease to the brain with leptomeningeal disease.  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 She underwent whole brain irradiation and currently undergoing treatment with targeted therapy with Tagrisso 160 mg p.o. daily because of the leptomeningeal disease status post 7 months of treatment.  The patient has been tolerating this treatment well with no concerning adverse effects except for the fatigue and weight loss. I recommended for her to continue her current treatment with Tagrisso with the same dose. I will see her back for follow-up visit in 1 months for evaluation with repeat CT scan of the chest, abdomen pelvis for restaging of her disease. For the lack of appetite, I will give her a Medrol Dosepak. For the leptomeningeal disease to have repeat MRI of the brain that showed no concerning findings for disease progression.  She is followed by Dr. Mickeal Skinner. For the history of pulmonary embolism she is currently on Eliquis. For the  history of depression she will continue her current treatment with Prozac. She was advised to call immediately if she has any other concerning symptoms in the interval. The patient voices understanding of current disease status and treatment options and is in agreement with the current care plan.  All questions were answered. The patient knows to call the clinic with any problems, questions or concerns. We can certainly see the patient much sooner if necessary.  Disclaimer: This note was dictated with voice recognition software. Similar sounding words can inadvertently be transcribed and may not be corrected upon review.

## 2020-08-30 DIAGNOSIS — C3490 Malignant neoplasm of unspecified part of unspecified bronchus or lung: Secondary | ICD-10-CM | POA: Diagnosis not present

## 2020-08-30 DIAGNOSIS — Z0289 Encounter for other administrative examinations: Secondary | ICD-10-CM | POA: Diagnosis not present

## 2020-09-02 DIAGNOSIS — Z20822 Contact with and (suspected) exposure to covid-19: Secondary | ICD-10-CM | POA: Diagnosis not present

## 2020-09-05 DIAGNOSIS — L218 Other seborrheic dermatitis: Secondary | ICD-10-CM | POA: Diagnosis not present

## 2020-09-05 DIAGNOSIS — L821 Other seborrheic keratosis: Secondary | ICD-10-CM | POA: Diagnosis not present

## 2020-09-05 DIAGNOSIS — L820 Inflamed seborrheic keratosis: Secondary | ICD-10-CM | POA: Diagnosis not present

## 2020-09-08 ENCOUNTER — Other Ambulatory Visit: Payer: Self-pay

## 2020-09-08 DIAGNOSIS — Z20822 Contact with and (suspected) exposure to covid-19: Secondary | ICD-10-CM | POA: Diagnosis not present

## 2020-09-08 DIAGNOSIS — I700 Atherosclerosis of aorta: Secondary | ICD-10-CM

## 2020-09-08 HISTORY — DX: Atherosclerosis of aorta: I70.0

## 2020-09-12 DIAGNOSIS — R5383 Other fatigue: Secondary | ICD-10-CM | POA: Diagnosis not present

## 2020-09-12 DIAGNOSIS — R279 Unspecified lack of coordination: Secondary | ICD-10-CM | POA: Diagnosis not present

## 2020-09-12 DIAGNOSIS — M6281 Muscle weakness (generalized): Secondary | ICD-10-CM | POA: Diagnosis not present

## 2020-09-12 DIAGNOSIS — R2681 Unsteadiness on feet: Secondary | ICD-10-CM | POA: Diagnosis not present

## 2020-09-13 ENCOUNTER — Ambulatory Visit: Payer: Medicare HMO | Admitting: Cardiology

## 2020-09-13 ENCOUNTER — Other Ambulatory Visit: Payer: Self-pay

## 2020-09-13 VITALS — BP 90/56 | HR 70 | Ht 65.0 in | Wt 149.0 lb

## 2020-09-13 DIAGNOSIS — I472 Ventricular tachycardia: Secondary | ICD-10-CM | POA: Diagnosis not present

## 2020-09-13 DIAGNOSIS — I4729 Other ventricular tachycardia: Secondary | ICD-10-CM

## 2020-09-13 DIAGNOSIS — I428 Other cardiomyopathies: Secondary | ICD-10-CM | POA: Diagnosis not present

## 2020-09-13 DIAGNOSIS — I447 Left bundle-branch block, unspecified: Secondary | ICD-10-CM

## 2020-09-13 DIAGNOSIS — C3432 Malignant neoplasm of lower lobe, left bronchus or lung: Secondary | ICD-10-CM

## 2020-09-13 MED ORDER — LOSARTAN POTASSIUM 50 MG PO TABS: 50.0000 mg | ORAL_TABLET | Freq: Every day | ORAL | 1 refills | Status: DC

## 2020-09-13 MED ORDER — METOPROLOL SUCCINATE ER 50 MG PO TB24: 75.0000 mg | ORAL_TABLET | Freq: Every day | ORAL | 1 refills | Status: DC

## 2020-09-13 NOTE — Patient Instructions (Signed)
Medication Instructions:  Your physician has recommended you make the following change in your medication:   DECREASE: Losartan to 50 mg daily   TAKE: Metoprolol succinate 75 mg daily  *If you need a refill on your cardiac medications before your next appointment, please call your pharmacy*   Lab Work: None If you have labs (blood work) drawn today and your tests are completely normal, you will receive your results only by: Marland Kitchen MyChart Message (if you have MyChart) OR . A paper copy in the mail If you have any lab test that is abnormal or we need to change your treatment, we will call you to review the results.   Testing/Procedures: None   Follow-Up: At [facility], you and your health needs are our priority.  As part of our continuing mission to provide you with exceptional heart care, we have created designated Provider Care Teams.  These Care Teams include your primary Cardiologist (physician) and Advanced Practice Providers (APPs -  Physician Assistants and Nurse Practitioners) who all work together to provide you with the care you need, when you need it.  We recommend signing up for the patient portal called "MyChart".  Sign up information is provided on this After Visit Summary.  MyChart is used to connect with patients for Virtual Visits (Telemedicine).  Patients are able to view lab/test results, encounter notes, upcoming appointments, etc.  Non-urgent messages can be sent to your provider as well.   To learn more about what you can do with MyChart, go to [email or web address].    Your next appointment:   4 month(s)  The format for your next appointment:   In Person  Provider:   Jenne Campus, MD   Other Instructions

## 2020-09-13 NOTE — Progress Notes (Signed)
Cardiology Office Note:    Date:  09/13/2020   ID:  Stefanie Camacho, DOB [birth year], MRN [id number]  PCP:  [name], MD  Cardiologist:  Jenne Campus, MD    Referring MD: [name], MD   Chief Complaint  Patient presents with  . Results  . Medication Management    History of Present Illness:    Stefanie Camacho is a 75 y.o. female with past medical history significant for left bundle branch block, nonischemic cardiomyopathy with ejection fraction 40 to 45%, essential hypertension, dyslipidemia, ventricular ectopy with nonsustained ventricular tachycardia.  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  That being aggressively treated with radiation as well as chemotherapy Comes today 2 months of follow-up.  She described to have some dizziness but the dizziness happen when she turns her head very quickly get up quickly or turns in the bed or closing her eyes.  No dizziness that would raise suspicion for arrhythmia related.  Denies have any chest pain tightness squeezing pressure burning chest, tiredness and fatigue is still there.  Past Medical History:  Diagnosis Date  . Acute CVA (cerebrovascular accident) ([place]) 01/09/2020  . Adenocarcinoma of left lung, stage 4 ([place]) 01/18/2020  . Anxiety   . Blood clots in brain   . 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 Chronic kidney disease, stage 3a ([place]) 05/16/2020  . Chronic respiratory failure ([place]) 05/16/2020  . Cough 03/18/2020  . Daytime somnolence 05/16/2020  . Dizziness 09/25/2019  . Dyslipidemia   . Encounter for antineoplastic chemotherapy 01/18/2020  . Essential hypertension   . Gastro-esophageal reflux disease without esophagitis   . Generalized weakness 03/18/2020  . GERD (gastroesophageal reflux disease)   . Goals of care, counseling/discussion 01/18/2020  . Hardening of the aorta (main artery of the heart) ([place]) 09/08/2020  . Hyperlipidemia  05/16/2020  . Hypothyroidism   . Hypothyroidism, unspecified   . Insomnia 05/16/2020  . Intracranial bleeding ([place]) 01/09/2020  . Left bundle branch block 06/04/2018  . 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 Long term (current) use of anticoagulants 05/16/2020  . Lumbar foraminal stenosis 12/30/2019  . Lung mass 05/16/2020  . 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 Major depression, single episode 05/16/2020  . Malignant neoplasm of lower lobe, left bronchus or lung ([place]) 01/14/2020  . NICM (nonischemic cardiomyopathy) ([place])   . Nonischemic cardiomyopathy ([place]) 06/04/2018   Ejection fraction 45% in summer 2019  . Nonobstructive cardiomyopathy ([place])   . Obstructive sleep apnea syndrome   . Recurrent falls 07/12/2020  . Rosacea 05/16/2020  . 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[place]) 01/28/2020  . 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 Single subsegmental pulmonary embolism without acute cor pulmonale ([place]) 03/25/2020  . Sleep apnea   . Stress incontinence (female) (female) 05/16/2020  . TIA (transient ischemic attack) 01/08/2020  . Vertigo 09/25/2019    Past Surgical History:  Procedure Laterality Date  . ABDOMINAL HYSTERECTOMY    . BLADDER NECK RECONSTRUCTION    . BRONCHIAL BIOPSY  01/12/2020   Procedure: BRONCHIAL BIOPSIES;  Surgeon: Garner Nash, DO;  Location: [facility];  Service: Pulmonary;;  . BRONCHIAL BRUSHINGS  01/12/2020   Procedure: BRONCHIAL BRUSHINGS;  Surgeon: Garner Nash, DO;  Location: [facility];  Service: Pulmonary;;  . BRONCHIAL NEEDLE ASPIRATION BIOPSY  01/12/2020   Procedure: BRONCHIAL NEEDLE  ASPIRATION BIOPSIES;  Surgeon: Garner Nash, DO;  Location: [facility];  Service: Pulmonary;;  . BRONCHIAL WASHINGS  01/12/2020   Procedure: BRONCHIAL WASHINGS;  Surgeon: Garner Nash, DO;  Location: [facility];  Service: Pulmonary;;  .  SHOULDER SURGERY    . TONSILLECTOMY    . VIDEO BRONCHOSCOPY WITH ENDOBRONCHIAL ULTRASOUND  01/12/2020   Procedure: VIDEO BRONCHOSCOPY WITH ENDOBRONCHIAL ULTRASOUND;  Surgeon: Garner Nash, DO;  Location: [facility];  Service: Pulmonary;;    Current Medications: Current Meds  Medication Sig  . apixaban (ELIQUIS) 5 MG TABS tablet Take 1 tablet (5 mg total) by mouth 2 (two) times daily.  Marland Kitchen atorvastatin (LIPITOR) 10 MG tablet Take 1 tablet (10 mg total) by mouth daily at 6 PM.  . Ensure Plus (ENSURE PLUS) LIQD Take 237 mLs by mouth as needed (For Nutritional Supplemntation. Unknow strength or amount).  Marland Kitchen FLUoxetine (PROZAC) 20 MG capsule Take 20 mg by mouth daily.  Marland Kitchen levETIRAcetam (KEPPRA) 250 MG tablet Take 250 mg by mouth 2 (two) times daily.  Marland Kitchen levothyroxine (SYNTHROID) 75 MCG tablet Take 1 tablet (75 mcg total) by mouth daily.  Marland Kitchen loperamide (IMODIUM) 2 MG capsule Take 2 mg by mouth as needed for diarrhea or loose stools.  Marland Kitchen losartan (COZAAR) 100 MG tablet Take 1 tablet by mouth daily. Skilled facility is administrating 10 mb  . metoprolol succinate (TOPROL-XL) 50 MG 24 hr tablet Take 1.5 tablets (75 mg total) by mouth daily. Take with or immediately following a meal.  . osimertinib mesylate (TAGRISSO) 80 MG tablet Take 2 tablets (160 mg total) by mouth daily. (Patient taking differently: Take 80 mg by mouth 2 (two) times daily.)  . pantoprazole (PROTONIX) 40 MG tablet Take 1 tablet by mouth once daily (Patient taking differently: Take 40 mg by mouth daily.)  . Polyethyl Glycol-Propyl Glycol (SYSTANE OP) Apply 1 drop to eye as needed (Eye dryness).  . prochlorperazine (COMPAZINE) 10 MG tablet Take 1 tablet (10 mg total) by mouth every 6 (six) hours as needed for nausea or vomiting.  Marland Kitchen spironolactone (ALDACTONE) 25 MG tablet Take 0.5 tablets (12.5 mg total) by mouth daily. (Patient taking differently: Take 25 mg by mouth daily. Skilled facility is administrating 25mg )     Allergies:    Sertraline hcl   Social History   Socioeconomic History  . Marital status: Divorced    Spouse name: Not on file  . Number of children: Not on file  . Years of education: Not on file  . Highest education level: Not on file  Occupational History  . Not on file  Tobacco Use  . Smoking status: Never Smoker  . Smokeless tobacco: Never Used  Vaping Use  . Vaping Use: Never used  Substance and Sexual Activity  . Alcohol use: Not Currently  . Drug use: Never  . Sexual activity: Not on file  Other Topics Concern  . Not on file  Social History Narrative  . Not on file   Social Determinants of Health   Financial Resource Strain: Not on file  Food Insecurity: Not on file  Transportation Needs: Not on file  Physical Activity: Not on file  Stress: Not on file  Social Connections: Not on file     Family History: The patient's family history includes Heart disease in her father, maternal grandfather, mother, and paternal grandfather; Stroke in her father and mother. ROS:   Please see the history of present illness.    All 14 point review of  systems negative except as described per history of present illness  EKGs/Labs/Other Studies Reviewed:      Recent Labs: 08/26/2020: ALT 17; BUN 21; Creatinine 1.34; Hemoglobin 13.6; Platelet Count 148; Potassium 3.7; Sodium 139  Recent Lipid Panel    Component Value Date/Time   CHOL 127 01/09/2020 0416   CHOL 133 07/17/2018 0933   TRIG 112 01/09/2020 0416   HDL 38 (L) 01/09/2020 0416   HDL 40 07/17/2018 0933   CHOLHDL 3.3 01/09/2020 0416   VLDL 22 01/09/2020 0416   LDLCALC 67 01/09/2020 0416   LDLCALC 67 07/17/2018 0933    Physical Exam:    VS:  BP (!) 90/56 (BP Location: Left Arm, Patient Position: Sitting)   Pulse 70   Ht 5\' 5"  (1.651 m)   Wt 169 lb (76.7 kg)   SpO2 93%   BMI 28.12 kg/m     Wt Readings from Last 3 Encounters:  09/13/20 169 lb (76.7 kg)  08/26/20 143 lb 4.8 oz (65 kg)  07/26/20 148 lb 11.2 oz (67.4 kg)      GEN:  Well nourished, well developed in no acute distress HEENT: Normal NECK: No JVD; No carotid bruits LYMPHATICS: No lymphadenopathy CARDIAC: RRR, no murmurs, no rubs, no gallops RESPIRATORY:  Clear to auscultation without rales, wheezing or rhonchi  ABDOMEN: Soft, non-tender, non-distended MUSCULOSKELETAL:  No edema; No deformity  SKIN: Warm and dry LOWER EXTREMITIES: no swelling NEUROLOGIC:  Alert and oriented x 3 PSYCHIATRIC:  Normal affect   ASSESSMENT:    1. Nonischemic cardiomyopathy ([place])   2. Nonsustained ventricular tachycardia ([place])   3. Left bundle branch block   4. Malignant neoplasm of lower lobe, left bronchus or lung ([facility])    PLAN:    In order of problems listed above:  1. Nonischemic cardiomyopathy on appropriate medication which she is able to tolerate the biggest limiting factor is her blood pressure being low today 90/56 and I will reduce her losartan to only 50 mg daily. 2. Nonsustained ventricular tachycardia beta-blocker has been increased from 50 mg to 75 mg.  No syncope no dizziness that would raise concern about arrhythmia related. 3. Left bundle branch block noted chronic. 4. Malignant lung cancer being treated aggressively by oncology team.   Medication Adjustments/Labs and Tests Ordered: Current medicines are reviewed at length with the patient today.  Concerns regarding medicines are outlined above.  No orders of the defined types were placed in this encounter.  Medication changes: No orders of the defined types were placed in this encounter.   Signed, Park Liter, MD, [facility] 09/13/2020 11:07 AM    [place]

## 2020-09-14 ENCOUNTER — Telehealth: Payer: Self-pay | Admitting: Medical Oncology

## 2020-09-14 DIAGNOSIS — Z20822 Contact with and (suspected) exposure to covid-19: Secondary | ICD-10-CM | POA: Diagnosis not present

## 2020-09-14 DIAGNOSIS — R2681 Unsteadiness on feet: Secondary | ICD-10-CM | POA: Diagnosis not present

## 2020-09-14 DIAGNOSIS — R5383 Other fatigue: Secondary | ICD-10-CM | POA: Diagnosis not present

## 2020-09-14 DIAGNOSIS — M6281 Muscle weakness (generalized): Secondary | ICD-10-CM | POA: Diagnosis not present

## 2020-09-14 DIAGNOSIS — R279 Unspecified lack of coordination: Secondary | ICD-10-CM | POA: Diagnosis not present

## 2020-09-14 DIAGNOSIS — C7A090 Malignant carcinoid tumor of the bronchus and lung: Secondary | ICD-10-CM | POA: Diagnosis not present

## 2020-09-14 DIAGNOSIS — R296 Repeated falls: Secondary | ICD-10-CM | POA: Diagnosis not present

## 2020-09-14 NOTE — Telephone Encounter (Signed)
Pt resides at [facility] ,  I told her it is okay to take 160 mg in am instead of 80 mg bid .

## 2020-09-15 ENCOUNTER — Ambulatory Visit: Payer: Medicare HMO | Admitting: Podiatry

## 2020-09-16 DIAGNOSIS — R279 Unspecified lack of coordination: Secondary | ICD-10-CM | POA: Diagnosis not present

## 2020-09-16 DIAGNOSIS — R5383 Other fatigue: Secondary | ICD-10-CM | POA: Diagnosis not present

## 2020-09-16 DIAGNOSIS — R2681 Unsteadiness on feet: Secondary | ICD-10-CM | POA: Diagnosis not present

## 2020-09-16 DIAGNOSIS — M6281 Muscle weakness (generalized): Secondary | ICD-10-CM | POA: Diagnosis not present

## 2020-09-19 DIAGNOSIS — C7A090 Malignant carcinoid tumor of the bronchus and lung: Secondary | ICD-10-CM | POA: Diagnosis not present

## 2020-09-19 DIAGNOSIS — R296 Repeated falls: Secondary | ICD-10-CM | POA: Diagnosis not present

## 2020-09-19 DIAGNOSIS — M6281 Muscle weakness (generalized): Secondary | ICD-10-CM | POA: Diagnosis not present

## 2020-09-20 DIAGNOSIS — M6281 Muscle weakness (generalized): Secondary | ICD-10-CM | POA: Diagnosis not present

## 2020-09-20 DIAGNOSIS — C7A090 Malignant carcinoid tumor of the bronchus and lung: Secondary | ICD-10-CM | POA: Diagnosis not present

## 2020-09-20 DIAGNOSIS — R296 Repeated falls: Secondary | ICD-10-CM | POA: Diagnosis not present

## 2020-09-22 DIAGNOSIS — R2681 Unsteadiness on feet: Secondary | ICD-10-CM | POA: Diagnosis not present

## 2020-09-22 DIAGNOSIS — M6281 Muscle weakness (generalized): Secondary | ICD-10-CM | POA: Diagnosis not present

## 2020-09-22 DIAGNOSIS — R5383 Other fatigue: Secondary | ICD-10-CM | POA: Diagnosis not present

## 2020-09-22 DIAGNOSIS — N390 Urinary tract infection, site not specified: Secondary | ICD-10-CM | POA: Diagnosis not present

## 2020-09-22 DIAGNOSIS — R279 Unspecified lack of coordination: Secondary | ICD-10-CM | POA: Diagnosis not present

## 2020-09-23 DIAGNOSIS — M6281 Muscle weakness (generalized): Secondary | ICD-10-CM | POA: Diagnosis not present

## 2020-09-23 DIAGNOSIS — R2681 Unsteadiness on feet: Secondary | ICD-10-CM | POA: Diagnosis not present

## 2020-09-23 DIAGNOSIS — R279 Unspecified lack of coordination: Secondary | ICD-10-CM | POA: Diagnosis not present

## 2020-09-23 DIAGNOSIS — R5383 Other fatigue: Secondary | ICD-10-CM | POA: Diagnosis not present

## 2020-09-26 ENCOUNTER — Inpatient Hospital Stay: Payer: Medicare Other | Attending: Internal Medicine

## 2020-09-26 ENCOUNTER — Ambulatory Visit (HOSPITAL_COMMUNITY)
Admission: RE | Admit: 2020-09-26 | Discharge: 2020-09-26 | Disposition: A | Discharge status: Home / Self Care | Payer: Medicare Other | Source: Ambulatory Visit | Attending: Internal Medicine | Admitting: Internal Medicine

## 2020-09-26 ENCOUNTER — Other Ambulatory Visit: Payer: Self-pay

## 2020-09-26 DIAGNOSIS — C7931 Secondary malignant neoplasm of brain: Secondary | ICD-10-CM | POA: Insufficient documentation

## 2020-09-26 DIAGNOSIS — Z79899 Other long term (current) drug therapy: Secondary | ICD-10-CM | POA: Insufficient documentation

## 2020-09-26 DIAGNOSIS — C3432 Malignant neoplasm of lower lobe, left bronchus or lung: Secondary | ICD-10-CM | POA: Diagnosis not present

## 2020-09-26 DIAGNOSIS — K7689 Other specified diseases of liver: Secondary | ICD-10-CM | POA: Diagnosis not present

## 2020-09-26 DIAGNOSIS — Z86711 Personal history of pulmonary embolism: Secondary | ICD-10-CM | POA: Insufficient documentation

## 2020-09-26 DIAGNOSIS — J90 Pleural effusion, not elsewhere classified: Secondary | ICD-10-CM | POA: Diagnosis not present

## 2020-09-26 DIAGNOSIS — C3490 Malignant neoplasm of unspecified part of unspecified bronchus or lung: Secondary | ICD-10-CM | POA: Diagnosis not present

## 2020-09-26 DIAGNOSIS — K6389 Other specified diseases of intestine: Secondary | ICD-10-CM | POA: Diagnosis not present

## 2020-09-26 DIAGNOSIS — C3492 Malignant neoplasm of unspecified part of left bronchus or lung: Secondary | ICD-10-CM

## 2020-09-26 DIAGNOSIS — Z7901 Long term (current) use of anticoagulants: Secondary | ICD-10-CM | POA: Insufficient documentation

## 2020-09-26 DIAGNOSIS — C7951 Secondary malignant neoplasm of bone: Secondary | ICD-10-CM | POA: Diagnosis not present

## 2020-09-26 DIAGNOSIS — J984 Other disorders of lung: Secondary | ICD-10-CM | POA: Diagnosis not present

## 2020-09-26 DIAGNOSIS — M6281 Muscle weakness (generalized): Secondary | ICD-10-CM | POA: Diagnosis not present

## 2020-09-26 DIAGNOSIS — C7A090 Malignant carcinoid tumor of the bronchus and lung: Secondary | ICD-10-CM | POA: Diagnosis not present

## 2020-09-26 DIAGNOSIS — N281 Cyst of kidney, acquired: Secondary | ICD-10-CM | POA: Diagnosis not present

## 2020-09-26 DIAGNOSIS — R296 Repeated falls: Secondary | ICD-10-CM | POA: Diagnosis not present

## 2020-09-26 LAB — CMP (CANCER CENTER ONLY)
ALT: 9 U/L (ref 0–44)
AST: 12 U/L — ABNORMAL LOW (ref 15–41)
Albumin: 3.7 g/dL (ref 3.5–5.0)
Alkaline Phosphatase: 130 U/L — ABNORMAL HIGH (ref 38–126)
Anion gap: 9 (ref 5–15)
BUN: 16 mg/dL (ref 8–23)
CO2: 27 mmol/L (ref 22–32)
Calcium: 10.1 mg/dL (ref 8.9–10.3)
Chloride: 104 mmol/L (ref 98–111)
Creatinine: 1.12 mg/dL — ABNORMAL HIGH (ref 0.44–1.00)
GFR, Estimated: 52 mL/min — ABNORMAL LOW (ref >60)
Glucose, Bld: 95 mg/dL (ref 70–99)
Potassium: 4.3 mmol/L (ref 3.5–5.1)
Sodium: 140 mmol/L (ref 135–145)
Total Bilirubin: 0.9 mg/dL (ref 0.3–1.2)
Total Protein: 6.7 g/dL (ref 6.5–8.1)

## 2020-09-26 LAB — CBC WITH DIFFERENTIAL (CANCER CENTER ONLY)
Abs Immature Granulocytes: 0.01 10*3/uL (ref 0.00–0.07)
Basophils Absolute: 0 10*3/uL (ref 0.0–0.1)
Basophils Relative: 0 %
Eosinophils Absolute: 0.1 10*3/uL (ref 0.0–0.5)
Eosinophils Relative: 1 %
HCT: 36.7 % (ref 36.0–46.0)
Hemoglobin: 12.3 g/dL (ref 12.0–15.0)
Immature Granulocytes: 0 %
Lymphocytes Relative: 18 %
Lymphs Abs: 0.8 10*3/uL (ref 0.7–4.0)
MCH: 31.4 pg (ref 26.0–34.0)
MCHC: 33.5 g/dL (ref 30.0–36.0)
MCV: 93.6 fL (ref 80.0–100.0)
Monocytes Absolute: 0.4 10*3/uL (ref 0.1–1.0)
Monocytes Relative: 8 %
Neutro Abs: 3.4 10*3/uL (ref 1.7–7.7)
Neutrophils Relative %: 73 %
Platelet Count: 149 10*3/uL — ABNORMAL LOW (ref 150–400)
RBC: 3.92 MIL/uL (ref 3.87–5.11)
RDW: 14.1 % (ref 11.5–15.5)
WBC Count: 4.7 10*3/uL (ref 4.0–10.5)
nRBC: 0 % (ref 0.0–0.2)

## 2020-09-26 MED ORDER — IOHEXOL 12 MG/ML PO SOLN: 500.0000 mL | ORAL | Status: AC

## 2020-09-26 MED ORDER — IOHEXOL 300 MG/ML  SOLN
75.0000 mL | Freq: Once | INTRAMUSCULAR | Status: AC | PRN
  Administered 2020-09-26: 75 mL via INTRAVENOUS

## 2020-09-26 MED ORDER — SODIUM CHLORIDE (PF) 0.9 % IJ SOLN
INTRAMUSCULAR | Status: AC
  Filled 2020-09-26: qty 50

## 2020-09-26 MED ORDER — IOHEXOL 9 MG/ML PO SOLN
ORAL | Status: AC
  Filled 2020-09-26: qty 1000

## 2020-09-27 ENCOUNTER — Ambulatory Visit: Payer: Medicare Other | Admitting: Podiatry

## 2020-09-27 DIAGNOSIS — B351 Tinea unguium: Secondary | ICD-10-CM

## 2020-09-27 DIAGNOSIS — M79675 Pain in left toe(s): Secondary | ICD-10-CM | POA: Diagnosis not present

## 2020-09-27 DIAGNOSIS — Z7901 Long term (current) use of anticoagulants: Secondary | ICD-10-CM

## 2020-09-27 DIAGNOSIS — C7A090 Malignant carcinoid tumor of the bronchus and lung: Secondary | ICD-10-CM | POA: Diagnosis not present

## 2020-09-27 DIAGNOSIS — M79674 Pain in right toe(s): Secondary | ICD-10-CM | POA: Diagnosis not present

## 2020-09-27 DIAGNOSIS — R296 Repeated falls: Secondary | ICD-10-CM | POA: Diagnosis not present

## 2020-09-27 DIAGNOSIS — M6281 Muscle weakness (generalized): Secondary | ICD-10-CM | POA: Diagnosis not present

## 2020-09-27 NOTE — Progress Notes (Signed)
Subjective:   Patient ID: Stefanie Camacho, female   DOB: 75 y.o.   MRN: [id number]   HPI 75 year old female presents the office today for concerns of toenail issues.  She states she had the nails removed previously and they grew back in thick and discolored causing them to turn and they rub on the adjacent toes.  Denies any redness or drainage or any swelling to the toenail sites.  She they are thick and she cannot trim them herself.    She is currently on Eliquis.   Review of Systems  All other systems reviewed and are negative.  Past Medical History:  Diagnosis Date  . Acute CVA (cerebrovascular accident) ([place]) 01/09/2020  . Adenocarcinoma of left lung, stage 4 ([place]) 01/18/2020  . Anxiety   . Blood clots in brain   . 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 Chronic kidney disease, stage 3a ([place]) 05/16/2020  . Chronic respiratory failure ([place]) 05/16/2020  . Cough 03/18/2020  . Daytime somnolence 05/16/2020  . Dizziness 09/25/2019  . Dyslipidemia   . Encounter for antineoplastic chemotherapy 01/18/2020  . Essential hypertension   . Gastro-esophageal reflux disease without esophagitis   . Generalized weakness 03/18/2020  . GERD (gastroesophageal reflux disease)   . Goals of care, counseling/discussion 01/18/2020  . Hardening of the aorta (main artery of the heart) ([place]) 09/08/2020  . Hyperlipidemia 05/16/2020  . Hypothyroidism   . Hypothyroidism, unspecified   . Insomnia 05/16/2020  . Intracranial bleeding ([place]) 01/09/2020  . Left bundle branch block 06/04/2018  . 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 Long term (current) use of anticoagulants 05/16/2020  . Lumbar foraminal stenosis 12/30/2019  . Lung mass 05/16/2020  . 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 Major depression, single episode 05/16/2020  . Malignant neoplasm of lower lobe, left bronchus or lung ([place]) 01/14/2020  . NICM (nonischemic cardiomyopathy) ([place])   . Nonischemic cardiomyopathy ([place])  06/04/2018   Ejection fraction 45% in summer 2019  . Nonobstructive cardiomyopathy ([place])   . Obstructive sleep apnea syndrome   . Recurrent falls 07/12/2020  . Rosacea 05/16/2020  . 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[place]) 01/28/2020  . 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 Single subsegmental pulmonary embolism without acute cor pulmonale ([place]) 03/25/2020  . Sleep apnea   . Stress incontinence (female) (female) 05/16/2020  . TIA (transient ischemic attack) 01/08/2020  . Vertigo 09/25/2019    Past Surgical History:  Procedure Laterality Date  . ABDOMINAL HYSTERECTOMY    . BLADDER NECK RECONSTRUCTION    . BRONCHIAL BIOPSY  01/12/2020   Procedure: BRONCHIAL BIOPSIES;  Surgeon: Garner Nash, DO;  Location: [facility];  Service: Pulmonary;;  . BRONCHIAL BRUSHINGS  01/12/2020   Procedure: BRONCHIAL BRUSHINGS;  Surgeon: Garner Nash, DO;  Location: [facility];  Service: Pulmonary;;  . BRONCHIAL NEEDLE ASPIRATION BIOPSY  01/12/2020   Procedure: BRONCHIAL NEEDLE ASPIRATION BIOPSIES;  Surgeon: Garner Nash, DO;  Location: [facility];  Service: Pulmonary;;  . BRONCHIAL WASHINGS  01/12/2020   Procedure: BRONCHIAL WASHINGS;  Surgeon: Garner Nash, DO;  Location: [facility];  Service: Pulmonary;;  . SHOULDER SURGERY    . TONSILLECTOMY    . VIDEO BRONCHOSCOPY WITH ENDOBRONCHIAL ULTRASOUND  01/12/2020   Procedure: VIDEO BRONCHOSCOPY WITH ENDOBRONCHIAL ULTRASOUND;  Surgeon: Garner Nash, DO;  Location: [facility];  Service: Pulmonary;;     Current Outpatient Medications:  .  apixaban (ELIQUIS) 5 MG TABS tablet, Take 1 tablet (5 mg total) by mouth 2 (two) times daily., Disp: 90 tablet, Rfl: 1 .  atorvastatin (LIPITOR) 10 MG tablet, Take 1 tablet (10 mg total) by mouth daily at 6 PM., Disp: 90 tablet, Rfl: 3 .  Ensure Plus (ENSURE PLUS) LIQD, Take 237 mLs by mouth as needed (For  Nutritional Supplemntation. Unknow strength or amount)., Disp: , Rfl:  .  FLUoxetine (PROZAC) 20 MG capsule, Take 20 mg by mouth daily., Disp: , Rfl:  .  levETIRAcetam (KEPPRA) 250 MG tablet, Take 250 mg by mouth 2 (two) times daily., Disp: , Rfl:  .  levothyroxine (SYNTHROID) 75 MCG tablet, Take 1 tablet (75 mcg total) by mouth daily., Disp: 30 tablet, Rfl: 1 .  loperamide (IMODIUM) 2 MG capsule, Take 2 mg by mouth as needed for diarrhea or loose stools., Disp: , Rfl:  .  losartan (COZAAR) 50 MG tablet, Take 1 tablet (50 mg total) by mouth daily., Disp: 90 tablet, Rfl: 1 .  metoprolol succinate (TOPROL-XL) 50 MG 24 hr tablet, Take 1.5 tablets (75 mg total) by mouth daily. Take with or immediately following a meal., Disp: 135 tablet, Rfl: 1 .  osimertinib mesylate (TAGRISSO) 80 MG tablet, Take 2 tablets (160 mg total) by mouth daily. (Patient taking differently: Take 80 mg by mouth 2 (two) times daily.), Disp: 60 tablet, Rfl: 3 .  pantoprazole (PROTONIX) 40 MG tablet, Take 1 tablet by mouth once daily (Patient taking differently: Take 40 mg by mouth daily.), Disp: 30 tablet, Rfl: 0 .  Polyethyl Glycol-Propyl Glycol (SYSTANE OP), Apply 1 drop to eye as needed (Eye dryness)., Disp: , Rfl:  .  prochlorperazine (COMPAZINE) 10 MG tablet, Take 1 tablet (10 mg total) by mouth every 6 (six) hours as needed for nausea or vomiting., Disp: 45 tablet, Rfl: 5 .  spironolactone (ALDACTONE) 25 MG tablet, Take 0.5 tablets (12.5 mg total) by mouth daily. (Patient taking differently: Take 25 mg by mouth daily. Skilled facility is administrating 25mg ), Disp: 45 tablet, Rfl: 3  Allergies  Allergen Reactions  . Sertraline Hcl Other (See Comments)         Objective:  Physical Exam  General: AAO x3, NAD  Dermatological: The nails are hypertrophic, dystrophic with yellow-brown discoloration most over the hallux nails.  The nails to become uncomfortable at times I think that the adjacent toes.  No edema, erythema or  signs of infection.  There is no open lesions.  Vascular: Dorsalis Pedis artery and Posterior Tibial artery pedal pulses are palpable bilateral with immedate capillary fill time. There is no pain with calf compression, swelling, warmth, erythema.   Neruologic: Grossly intact via light touch bilateral.   Musculoskeletal: No pain, crepitus, or limitation noted with foot and ankle range of motion bilateral. Muscular strength 5/5 in all groups tested bilateral.      Assessment:   Symptomatic onychomycosis, currently on Eliquis     Plan:  -Treatment options discussed including all alternatives, risks, and complications -Etiology of symptoms were discussed -Discussed nail removal but for now we will continue with conservative treatment in the routine debridement.  If she wishes we can always remove the nails but given the blood thinners other issues could delay healing -Nails debrided 10 without complications or bleeding. -Daily foot inspection -Follow-up in 3 months or sooner if any problems arise. In the meantime, encouraged to call the office with any questions, concerns, change in symptoms.   Celesta Gentile, DPM

## 2020-09-28 ENCOUNTER — Other Ambulatory Visit: Payer: Self-pay

## 2020-09-28 ENCOUNTER — Inpatient Hospital Stay: Payer: Medicare Other | Admitting: Internal Medicine

## 2020-09-28 VITALS — BP 119/61 | HR 66 | Temp 97.5°F | Resp 20 | Ht 65.0 in | Wt 148.7 lb

## 2020-09-28 DIAGNOSIS — C7951 Secondary malignant neoplasm of bone: Secondary | ICD-10-CM | POA: Diagnosis not present

## 2020-09-28 DIAGNOSIS — C7932 Secondary malignant neoplasm of cerebral meninges: Secondary | ICD-10-CM

## 2020-09-28 DIAGNOSIS — M6281 Muscle weakness (generalized): Secondary | ICD-10-CM | POA: Diagnosis not present

## 2020-09-28 DIAGNOSIS — C7931 Secondary malignant neoplasm of brain: Secondary | ICD-10-CM

## 2020-09-28 DIAGNOSIS — R2681 Unsteadiness on feet: Secondary | ICD-10-CM | POA: Diagnosis not present

## 2020-09-28 DIAGNOSIS — R5383 Other fatigue: Secondary | ICD-10-CM | POA: Diagnosis not present

## 2020-09-28 DIAGNOSIS — R279 Unspecified lack of coordination: Secondary | ICD-10-CM | POA: Diagnosis not present

## 2020-09-28 DIAGNOSIS — C3432 Malignant neoplasm of lower lobe, left bronchus or lung: Secondary | ICD-10-CM | POA: Diagnosis not present

## 2020-09-28 DIAGNOSIS — Z5111 Encounter for antineoplastic chemotherapy: Secondary | ICD-10-CM

## 2020-09-28 DIAGNOSIS — Z86711 Personal history of pulmonary embolism: Secondary | ICD-10-CM | POA: Diagnosis not present

## 2020-09-28 DIAGNOSIS — C7949 Secondary malignant neoplasm of other parts of nervous system: Secondary | ICD-10-CM

## 2020-09-28 DIAGNOSIS — C3492 Malignant neoplasm of unspecified part of left bronchus or lung: Secondary | ICD-10-CM

## 2020-09-28 DIAGNOSIS — Z7901 Long term (current) use of anticoagulants: Secondary | ICD-10-CM | POA: Diagnosis not present

## 2020-09-28 DIAGNOSIS — Z79899 Other long term (current) drug therapy: Secondary | ICD-10-CM | POA: Diagnosis not present

## 2020-09-28 NOTE — Progress Notes (Signed)
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 2) pulmonary embolus in the right main pulmonary artery diagnosed in November 2021.  Biomarker Findings Microsatellite status - MS-Stable Tumor Mutational Burden - 1 Muts/Mb Genomic Findings For a complete list of the genes assayed, please refer to the Appendix. EGFR exon 19 deletion ([id number]) 7 Disease relevant genes with no reportable alterations: ALK, BRAF, ERBB2, KRAS, MET, RET, ROS1  PDL1 expression: 2%.  PRIOR THERAPY: Whole brain radiation and radiotherapy to the osseous metastases under the care of Dr. Lisbeth Renshaw. Last treatment expected on 02/02/20  CURRENT THERAPY:  1) Tagrisso160 mg p.o. daily. First dose on9/27/21. Status post 8 months of treatment. 2) Eliquis 5 mg p.o. twice daily  INTERVAL HISTORY: Stefanie Camacho 75 y.o. female returns to the clinic today for follow-up visit accompanied by her son.  The patient is currently on a skilled nursing independent facility.  She denied having any current chest pain, shortness of breath, cough or hemoptysis.  She continues to have mild mid back pain.  She has no nausea, vomiting but has occasional diarrhea with no constipation.  She takes Imodium on a as needed basis.  She denied having any headache or visual changes.  She continues to tolerate her treatment with Tagrisso fairly well.  She had repeat CT scan of the chest, abdomen pelvis performed recently and she is here for evaluation and discussion of her risk her  results.  MEDICAL HISTORY: Past Medical History:  Diagnosis Date  . Acute CVA (cerebrovascular accident) ([place]) 01/09/2020  . Adenocarcinoma of left lung, stage 4 ([place]) 01/18/2020  . Anxiety   . Blood clots in brain   . 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 Chronic kidney disease, stage 3a ([place]) 05/16/2020  . Chronic respiratory failure ([place]) 05/16/2020  . Cough 03/18/2020  . Daytime somnolence 05/16/2020  . Dizziness 09/25/2019  . Dyslipidemia   . Encounter for antineoplastic chemotherapy 01/18/2020  . Essential hypertension   . Gastro-esophageal reflux disease without esophagitis   . Generalized weakness 03/18/2020  . GERD (gastroesophageal reflux disease)   . Goals of care, counseling/discussion 01/18/2020  . Hardening of the aorta (main artery of the heart) ([place]) 09/08/2020  . Hyperlipidemia 05/16/2020  . Hypothyroidism   . Hypothyroidism, unspecified   . Insomnia 05/16/2020  . Intracranial bleeding ([place]) 01/09/2020  . Left bundle branch block 06/04/2018  . 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 Long term (current) use of anticoagulants 05/16/2020  . Lumbar foraminal stenosis 12/30/2019  . Lung mass 05/16/2020  . 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[place]) 05/16/2020  . Major depression, single episode 05/16/2020  . Malignant neoplasm of lower lobe, left bronchus or lung ([place]) 01/14/2020  . NICM (nonischemic cardiomyopathy) ([place])   . Nonischemic cardiomyopathy ([place]) 06/04/2018   Ejection fraction 45% in summer 2019  . Nonobstructive cardiomyopathy ([place])   . Obstructive sleep apnea syndrome   . Recurrent falls 07/12/2020  . Rosacea 05/16/2020  . 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[place]) 01/28/2020  . 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 Single subsegmental pulmonary embolism without acute cor pulmonale ([place]) 03/25/2020  . Sleep apnea   . Stress incontinence  (female) (female) 05/16/2020  . TIA (transient ischemic attack) 01/08/2020  . Vertigo 09/25/2019    ALLERGIES:  is allergic to sertraline hcl.  MEDICATIONS:  Current Outpatient Medications  Medication Sig Dispense Refill  . apixaban (ELIQUIS) 5 MG TABS tablet Take 1 tablet (5 mg total) by mouth 2 (two) times daily. 90 tablet 1  . atorvastatin (LIPITOR) 10 MG tablet Take 1 tablet (10 mg total) by mouth daily at 6 PM. 90 tablet 3  . Ensure Plus (ENSURE PLUS) LIQD Take 237 mLs by mouth as needed (For Nutritional Supplemntation. Unknow strength or amount).    Marland Kitchen FLUoxetine (PROZAC) 20 MG capsule Take 20 mg by mouth daily.    Marland Kitchen levETIRAcetam (KEPPRA) 250 MG tablet Take 250 mg by mouth 2 (two) times daily.    Marland Kitchen levothyroxine (SYNTHROID) 75 MCG tablet Take 1 tablet (75 mcg total) by mouth daily. 30 tablet 1  . loperamide (IMODIUM) 2 MG capsule Take 2 mg by mouth as needed for diarrhea or loose stools.    Marland Kitchen losartan (COZAAR) 50 MG tablet Take 1 tablet (50 mg total) by mouth daily. 90 tablet 1  . metoprolol succinate (TOPROL-XL) 50 MG 24 hr tablet Take 1.5 tablets (75 mg total) by mouth daily. Take with or immediately following a meal. 135 tablet 1  . osimertinib mesylate (TAGRISSO) 80 MG tablet Take 2 tablets (160 mg total) by mouth daily. (Patient taking differently: Take 80 mg by mouth 2 (two) times daily.) 60 tablet 3  . pantoprazole (PROTONIX) 40 MG tablet Take 1 tablet by mouth once daily (Patient taking differently: Take 40 mg by mouth daily.) 30 tablet 0  . Polyethyl Glycol-Propyl Glycol (SYSTANE OP) Apply 1 drop to eye as needed (Eye dryness).    . prochlorperazine (COMPAZINE) 10 MG tablet Take 1 tablet (10 mg total) by mouth every 6 (six) hours as needed for nausea or vomiting. 45 tablet 5  . spironolactone (ALDACTONE) 25 MG tablet Take 0.5 tablets (12.5 mg total) by mouth daily. (Patient taking differently: Take 25 mg by mouth daily. Skilled facility is administrating 62m) 45 tablet 3   No  current facility-administered medications for this visit.    SURGICAL HISTORY:  Past Surgical History:  Procedure Laterality Date  . ABDOMINAL HYSTERECTOMY    . BLADDER NECK RECONSTRUCTION    . BRONCHIAL BIOPSY  01/12/2020   Procedure: BRONCHIAL BIOPSIES;  Surgeon: IGarner Nash DO;  [facility];  Service: Pulmonary;;  . BRONCHIAL BRUSHINGS  01/12/2020   Procedure: BRONCHIAL BRUSHINGS;  Surgeon: IGarner Nash DO;  [facility];  Service: Pulmonary;;  . BRONCHIAL NEEDLE ASPIRATION BIOPSY  01/12/2020   Procedure: BRONCHIAL NEEDLE ASPIRATION BIOPSIES;  Surgeon: IGarner Nash DO;  [facility];  Service: Pulmonary;;  . BRONCHIAL WASHINGS  01/12/2020   Procedure: BRONCHIAL WASHINGS;  Surgeon: IGarner Nash DO;  [facility];  Service: Pulmonary;;  . SHOULDER SURGERY    . TONSILLECTOMY    . VIDEO BRONCHOSCOPY WITH ENDOBRONCHIAL ULTRASOUND  01/12/2020   Procedure: VIDEO BRONCHOSCOPY WITH ENDOBRONCHIAL ULTRASOUND;  Surgeon: IGarner Nash DO;  [facility];  Service: Pulmonary;;    REVIEW OF SYSTEMS:  Constitutional: positive for fatigue Eyes: negative Ears, nose, mouth, throat, and face: negative Respiratory: negative Cardiovascular: negative Gastrointestinal: positive for diarrhea Genitourinary:negative Integument/breast: negative  Hematologic/lymphatic: negative Musculoskeletal:positive for back pain and muscle weakness Neurological: negative Behavioral/Psych: negative Endocrine: negative Allergic/Immunologic: negative   PHYSICAL EXAMINATION: General appearance: alert, cooperative, fatigued and no distress Head: Normocephalic, without obvious abnormality, atraumatic Neck: no adenopathy, no JVD, supple, symmetrical, trachea midline and thyroid not enlarged, symmetric, no tenderness/mass/nodules Lymph nodes: Cervical, supraclavicular, and axillary nodes normal. Resp: clear to auscultation bilaterally Back: symmetric, no  curvature. ROM normal. No CVA tenderness. Cardio: regular rate and rhythm, S1, S2 normal, no murmur, click, rub or gallop GI: soft, non-tender; bowel sounds normal; no masses,  no organomegaly Extremities: extremities normal, atraumatic, no cyanosis or edema Neurologic: Alert and oriented X 3, normal strength and tone. Normal symmetric reflexes. Normal coordination and gait  ECOG PERFORMANCE STATUS: 1 - Symptomatic but completely ambulatory  Blood pressure 119/61, pulse 66, temperature (!) 97.5 F (36.4 C), temperature source Tympanic, resp. rate 20, height '5\' 5"'  (1.651 m), weight 148 lb 11.2 oz (67.4 kg), SpO2 95 %.  LABORATORY DATA: Lab Results  Component Value Date   WBC 4.7 09/26/2020   HGB 12.3 09/26/2020   HCT 36.7 09/26/2020   MCV 93.6 09/26/2020   PLT 149 (L) 09/26/2020      Chemistry      Component Value Date/Time   NA 140 09/26/2020 0906   K 4.3 09/26/2020 0906   CL 104 09/26/2020 0906   CO2 27 09/26/2020 0906   BUN 16 09/26/2020 0906   CREATININE 1.12 (H) 09/26/2020 0906      Component Value Date/Time   CALCIUM 10.1 09/26/2020 0906   ALKPHOS 130 (H) 09/26/2020 0906   AST 12 (L) 09/26/2020 0906   ALT 9 09/26/2020 0906   BILITOT 0.9 09/26/2020 0906       RADIOGRAPHIC STUDIES: CT Chest W Contrast  Result Date: 09/27/2020 CLINICAL DATA:  Primary Cancer Type: Lung Imaging Indication: Assess response to therapy Interval therapy since last imaging? Yes Initial Cancer Diagnosis Date: 01/12/2020; Established by: Biopsy-proven Detailed Pathology: Stage IV non-small cell lung cancer, adenocarcinoma. Primary Tumor location: Left lower lobe. Pulmonary and osseous metastases. Metastatic disease to the brain and leptomeningeal disease. Surgeries: Hysterectomy. Chemotherapy: Yes; Ongoing?  Yes, Tagrisso daily Immunotherapy? No Radiation therapy? Yes; Date Range: 01/18/2020 - 02/02/2020; Target: Brain, left lung, left pelvis, right pelvis EXAM: CT CHEST, ABDOMEN, AND PELVIS WITH  CONTRAST TECHNIQUE: Multidetector CT imaging of the chest, abdomen and pelvis was performed following the standard protocol during bolus administration of intravenous contrast. CONTRAST:  33m OMNIPAQUE IOHEXOL 300 MG/ML  SOLN COMPARISON:  Most recent CT chest, abdomen and pelvis 06/24/2020. 01/25/2020 PET-CT. FINDINGS: CT CHEST FINDINGS Cardiovascular: The heart size is normal. No substantial pericardial effusion. No thoracic aortic aneurysm Mediastinum/Nodes: No mediastinal lymphadenopathy. No right hilar lymphadenopathy similar appearance collapse/consolidative opacity in the left hilum. The esophagus has normal imaging features. There is no axillary lymphadenopathy. Lungs/Pleura: Similar appearance of scattered small ground-glass opacities in both lungs. 6 mm left upper lower lobe index ground-glass nodule measured previously is stable at 6 mm today (58/4). Previously measured linear irregular nodule in the right apex is stable at 7 mm (41/4). Collapse/consolidative disease in the left parahilar lung with associated volume loss in the left hemithorax is similar to prior. Small to moderate left pleural effusion is stable. Musculoskeletal: Scattered sclerotic bone metastases again noted 7 mm index lesion in the T12 vertebral body is stable at 7 mm today (132/4). 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 Subtle 7 mm  hypervascular lesion in the subcapsular dome of liver (51/2) was not definitely visible on the prior study. 9 mm enhancing lesion in the inferior right liver along the gallbladder fossa was not hypervascular on the prior study. Similar appearance of a 6 mm hypoenhancing lesion in the inferior right liver on 65/2. There is no evidence for gallstones, gallbladder wall thickening, or pericholecystic fluid. No intrahepatic or extrahepatic biliary dilation. Pancreas: No focal mass lesion. No dilatation of the main duct. No  intraparenchymal cyst. No peripancreatic edema. Spleen: No splenomegaly. No focal mass lesion. Adrenals/Urinary Tract: No adrenal nodule or mass. Small cyst lower pole right kidney is similar to prior. Left kidney unremarkable No evidence for hydroureter. The urinary bladder appears normal for the degree of distention. Stomach/Bowel: Stomach is distended with contrast material and food. Duodenum is normally positioned as is the ligament of Treitz. No small bowel wall thickening. No small bowel dilatation. The terminal ileum is normal. The appendix is not well visualized, but there is no edema or inflammation in the region of the cecum. No gross colonic mass. No colonic wall thickening. Vascular/Lymphatic: There is abdominal aortic atherosclerosis without aneurysm. There is no gastrohepatic or hepatoduodenal ligament lymphadenopathy. No retroperitoneal or mesenteric lymphadenopathy. No pelvic sidewall lymphadenopathy. Reproductive: Uterus surgically absent.  There is no adnexal mass. Other: No intraperitoneal free fluid. Musculoskeletal: Degenerative changes are noted in both hips. Sclerotic bone metastases again noted index lesion measured previously posterior right iliac bone at 1.1 cm is stable at 1.1 cm today (91/2). IMPRESSION: 1. Generally stable exam. No findings to suggest definite new or progressive metastatic disease. 2. Scattered ground-glass nodules in both lungs are similar to prior. 3. The collapse/consolidative opacity in the parahilar left lung with associated volume loss is stable, presumably treatment related. 4. Scattered hepatic lesions. There is a new 7 mm focus of enhancement in the subcapsular dome of liver not definitely seen previously. A 9 mm hypoenhancing lesion on the prior study now shows flash filling and may be a hemangioma with differential appearance due to bolus timing. Attention in these regions on follow-up recommended. 5. Stable appearance of sclerotic bone metastases. 6.  Aortic  Atherosclerois (ICD10-170.0) Electronically Signed   By: Misty Stanley M.D.   On: 09/27/2020 10:42   CT Abdomen Pelvis W Contrast  Result Date: 09/27/2020 CLINICAL DATA:  Primary Cancer Type: Lung Imaging Indication: Assess response to therapy Interval therapy since last imaging? Yes Initial Cancer Diagnosis Date: 01/12/2020; Established by: Biopsy-proven Detailed Pathology: Stage IV non-small cell lung cancer, adenocarcinoma. Primary Tumor location: Left lower lobe. Pulmonary and osseous metastases. Metastatic disease to the brain and leptomeningeal disease. Surgeries: Hysterectomy. Chemotherapy: Yes; Ongoing?  Yes, Tagrisso daily Immunotherapy? No Radiation therapy? Yes; Date Range: 01/18/2020 - 02/02/2020; Target: Brain, left lung, left pelvis, right pelvis EXAM: CT CHEST, ABDOMEN, AND PELVIS WITH CONTRAST TECHNIQUE: Multidetector CT imaging of the chest, abdomen and pelvis was performed following the standard protocol during bolus administration of intravenous contrast. CONTRAST:  69m OMNIPAQUE IOHEXOL 300 MG/ML  SOLN COMPARISON:  Most recent CT chest, abdomen and pelvis 06/24/2020. 01/25/2020 PET-CT. FINDINGS: CT CHEST FINDINGS Cardiovascular: The heart size is normal. No substantial pericardial effusion. No thoracic aortic aneurysm Mediastinum/Nodes: No mediastinal lymphadenopathy. No right hilar lymphadenopathy similar appearance collapse/consolidative opacity in the left hilum. The esophagus has normal imaging features. There is no axillary lymphadenopathy. Lungs/Pleura: Similar appearance of scattered small ground-glass opacities in both lungs. 6 mm left upper lower lobe index ground-glass nodule measured previously is stable at 6  mm today (58/4). Previously measured linear irregular nodule in the right apex is stable at 7 mm (41/4). Collapse/consolidative disease in the left parahilar lung with associated volume loss in the left hemithorax is similar to prior. Small to moderate left pleural effusion  is stable. Musculoskeletal: Scattered sclerotic bone metastases again noted 7 mm index lesion in the T12 vertebral body is stable at 7 mm today (132/4). 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 Subtle 7 mm hypervascular lesion in the subcapsular dome of liver (51/2) was not definitely visible on the prior study. 9 mm enhancing lesion in the inferior right liver along the gallbladder fossa was not hypervascular on the prior study. Similar appearance of a 6 mm hypoenhancing lesion in the inferior right liver on 65/2. There is no evidence for gallstones, gallbladder wall thickening, or pericholecystic fluid. No intrahepatic or extrahepatic biliary dilation. Pancreas: No focal mass lesion. No dilatation of the main duct. No intraparenchymal cyst. No peripancreatic edema. Spleen: No splenomegaly. No focal mass lesion. Adrenals/Urinary Tract: No adrenal nodule or mass. Small cyst lower pole right kidney is similar to prior. Left kidney unremarkable No evidence for hydroureter. The urinary bladder appears normal for the degree of distention. Stomach/Bowel: Stomach is distended with contrast material and food. Duodenum is normally positioned as is the ligament of Treitz. No small bowel wall thickening. No small bowel dilatation. The terminal ileum is normal. The appendix is not well visualized, but there is no edema or inflammation in the region of the cecum. No gross colonic mass. No colonic wall thickening. Vascular/Lymphatic: There is abdominal aortic atherosclerosis without aneurysm. There is no gastrohepatic or hepatoduodenal ligament lymphadenopathy. No retroperitoneal or mesenteric lymphadenopathy. No pelvic sidewall lymphadenopathy. Reproductive: Uterus surgically absent.  There is no adnexal mass. Other: No intraperitoneal free fluid. Musculoskeletal: Degenerative changes are noted in both hips. Sclerotic bone metastases again noted index  lesion measured previously posterior right iliac bone at 1.1 cm is stable at 1.1 cm today (91/2). IMPRESSION: 1. Generally stable exam. No findings to suggest definite new or progressive metastatic disease. 2. Scattered ground-glass nodules in both lungs are similar to prior. 3. The collapse/consolidative opacity in the parahilar left lung with associated volume loss is stable, presumably treatment related. 4. Scattered hepatic lesions. There is a new 7 mm focus of enhancement in the subcapsular dome of liver not definitely seen previously. A 9 mm hypoenhancing lesion on the prior study now shows flash filling and may be a hemangioma with differential appearance due to bolus timing. Attention in these regions on follow-up recommended. 5. Stable appearance of sclerotic bone metastases. 6.  Aortic Atherosclerois (ICD10-170.0) Electronically Signed   By: Misty Stanley M.D.   On: 09/27/2020 10:42    ASSESSMENT AND PLAN: This is a very pleasant 75 years old white female recently diagnosed with a stage IV non-small cell lung cancer, adenocarcinoma in September 2021 and presented with left lower lobe lung mass in addition to extensive bilateral pulmonary metastasis and anterior mediastinal lymphadenopathy as well as a skeletal metastasis and metastatic disease to the brain with leptomeningeal disease.  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 She underwent whole brain irradiation and currently undergoing treatment with targeted therapy with Tagrisso 160 mg p.o. daily because of the leptomeningeal disease status post 8 months of treatment.  The patient continues to tolerate this treatment well with no concerning adverse effects.  She had repeat CT scan of the chest, abdomen pelvis performed recently.  I personally and independently reviewed the scans and discussed the results with the patient today. Her scan showed no concerning findings for disease  progression but there was a new 7 mm foci of enhancement in the subcapsular dome of the liver that was not seen previously in need close monitoring. I recommended for the patient to continue her current treatment with Tagrisso with the same dose. I will see her back for follow-up visit in 1 months for evaluation with repeat blood work. For the history of leptomeningeal disease and brain metastasis, she will continue her routine follow-up visit and evaluation by Dr. Mickeal Skinner. For the pulmonary embolism she is currently on Eliquis. For the history of depression the patient will continue her current treatment with Prozac. She was advised to call immediately if she has any concerning symptoms in the interval.  The patient voices understanding of current disease status and treatment options and is in agreement with the current care plan.  All questions were answered. The patient knows to call the clinic with any problems, questions or concerns. We can certainly see the patient much sooner if necessary.  Disclaimer: This note was dictated with voice recognition software. Similar sounding words can inadvertently be transcribed and may not be corrected upon review.

## 2020-09-29 DIAGNOSIS — M6281 Muscle weakness (generalized): Secondary | ICD-10-CM | POA: Diagnosis not present

## 2020-09-29 DIAGNOSIS — C7A090 Malignant carcinoid tumor of the bronchus and lung: Secondary | ICD-10-CM | POA: Diagnosis not present

## 2020-09-29 DIAGNOSIS — R296 Repeated falls: Secondary | ICD-10-CM | POA: Diagnosis not present

## 2020-09-30 ENCOUNTER — Telehealth: Payer: Self-pay | Admitting: Internal Medicine

## 2020-09-30 NOTE — Telephone Encounter (Signed)
Scheduled per los. Called and spoke with patient. Confirmed appt 

## 2020-10-01 DIAGNOSIS — R5383 Other fatigue: Secondary | ICD-10-CM | POA: Diagnosis not present

## 2020-10-01 DIAGNOSIS — R2681 Unsteadiness on feet: Secondary | ICD-10-CM | POA: Diagnosis not present

## 2020-10-01 DIAGNOSIS — M6281 Muscle weakness (generalized): Secondary | ICD-10-CM | POA: Diagnosis not present

## 2020-10-01 DIAGNOSIS — R279 Unspecified lack of coordination: Secondary | ICD-10-CM | POA: Diagnosis not present

## 2020-10-03 DIAGNOSIS — R296 Repeated falls: Secondary | ICD-10-CM | POA: Diagnosis not present

## 2020-10-03 DIAGNOSIS — M6281 Muscle weakness (generalized): Secondary | ICD-10-CM | POA: Diagnosis not present

## 2020-10-03 DIAGNOSIS — C7A090 Malignant carcinoid tumor of the bronchus and lung: Secondary | ICD-10-CM | POA: Diagnosis not present

## 2020-10-05 DIAGNOSIS — R2681 Unsteadiness on feet: Secondary | ICD-10-CM | POA: Diagnosis not present

## 2020-10-05 DIAGNOSIS — R5383 Other fatigue: Secondary | ICD-10-CM | POA: Diagnosis not present

## 2020-10-05 DIAGNOSIS — R279 Unspecified lack of coordination: Secondary | ICD-10-CM | POA: Diagnosis not present

## 2020-10-05 DIAGNOSIS — M6281 Muscle weakness (generalized): Secondary | ICD-10-CM | POA: Diagnosis not present

## 2020-10-06 ENCOUNTER — Telehealth: Payer: Self-pay | Admitting: Medical Oncology

## 2020-10-06 DIAGNOSIS — C3492 Malignant neoplasm of unspecified part of left bronchus or lung: Secondary | ICD-10-CM

## 2020-10-06 DIAGNOSIS — C7A090 Malignant carcinoid tumor of the bronchus and lung: Secondary | ICD-10-CM | POA: Diagnosis not present

## 2020-10-06 DIAGNOSIS — R296 Repeated falls: Secondary | ICD-10-CM | POA: Diagnosis not present

## 2020-10-06 DIAGNOSIS — M6281 Muscle weakness (generalized): Secondary | ICD-10-CM | POA: Diagnosis not present

## 2020-10-06 MED ORDER — OSIMERTINIB MESYLATE 80 MG PO TABS: 160.0000 mg | ORAL_TABLET | Freq: Every day | ORAL | 3 refills | Status: DC

## 2020-10-06 NOTE — Telephone Encounter (Signed)
Pt called back and LM stating she missed a call.  I have called the pt back and advised as indicated. She understands that she also need to call [facility] Customer Service to discuss delivery.

## 2020-10-06 NOTE — Telephone Encounter (Signed)
Refill and New address She needs refill for tagrisso and to contact pharmacy with her new address to  mail Medication. She resides at   [address] [place], [place] [address]   Left this message for [facility] with refill prescription information and new address. Marland Kitchen

## 2020-10-07 DIAGNOSIS — R279 Unspecified lack of coordination: Secondary | ICD-10-CM | POA: Diagnosis not present

## 2020-10-07 DIAGNOSIS — R5383 Other fatigue: Secondary | ICD-10-CM | POA: Diagnosis not present

## 2020-10-07 DIAGNOSIS — R2681 Unsteadiness on feet: Secondary | ICD-10-CM | POA: Diagnosis not present

## 2020-10-07 DIAGNOSIS — M6281 Muscle weakness (generalized): Secondary | ICD-10-CM | POA: Diagnosis not present

## 2020-10-11 DIAGNOSIS — R296 Repeated falls: Secondary | ICD-10-CM | POA: Diagnosis not present

## 2020-10-11 DIAGNOSIS — M6281 Muscle weakness (generalized): Secondary | ICD-10-CM | POA: Diagnosis not present

## 2020-10-11 DIAGNOSIS — N390 Urinary tract infection, site not specified: Secondary | ICD-10-CM | POA: Diagnosis not present

## 2020-10-11 DIAGNOSIS — C7A090 Malignant carcinoid tumor of the bronchus and lung: Secondary | ICD-10-CM | POA: Diagnosis not present

## 2020-10-12 ENCOUNTER — Ambulatory Visit: Payer: Medicare HMO | Admitting: Cardiology

## 2020-10-12 DIAGNOSIS — M6281 Muscle weakness (generalized): Secondary | ICD-10-CM | POA: Diagnosis not present

## 2020-10-12 DIAGNOSIS — R296 Repeated falls: Secondary | ICD-10-CM | POA: Diagnosis not present

## 2020-10-12 DIAGNOSIS — C7A090 Malignant carcinoid tumor of the bronchus and lung: Secondary | ICD-10-CM | POA: Diagnosis not present

## 2020-10-13 DIAGNOSIS — R2681 Unsteadiness on feet: Secondary | ICD-10-CM | POA: Diagnosis not present

## 2020-10-13 DIAGNOSIS — M6281 Muscle weakness (generalized): Secondary | ICD-10-CM | POA: Diagnosis not present

## 2020-10-13 DIAGNOSIS — R279 Unspecified lack of coordination: Secondary | ICD-10-CM | POA: Diagnosis not present

## 2020-10-13 DIAGNOSIS — R5383 Other fatigue: Secondary | ICD-10-CM | POA: Diagnosis not present

## 2020-10-14 DIAGNOSIS — C7A090 Malignant carcinoid tumor of the bronchus and lung: Secondary | ICD-10-CM | POA: Diagnosis not present

## 2020-10-14 DIAGNOSIS — R296 Repeated falls: Secondary | ICD-10-CM | POA: Diagnosis not present

## 2020-10-14 DIAGNOSIS — R2681 Unsteadiness on feet: Secondary | ICD-10-CM | POA: Diagnosis not present

## 2020-10-14 DIAGNOSIS — R5383 Other fatigue: Secondary | ICD-10-CM | POA: Diagnosis not present

## 2020-10-14 DIAGNOSIS — R279 Unspecified lack of coordination: Secondary | ICD-10-CM | POA: Diagnosis not present

## 2020-10-14 DIAGNOSIS — M6281 Muscle weakness (generalized): Secondary | ICD-10-CM | POA: Diagnosis not present

## 2020-10-19 DIAGNOSIS — R296 Repeated falls: Secondary | ICD-10-CM | POA: Diagnosis not present

## 2020-10-19 DIAGNOSIS — M6281 Muscle weakness (generalized): Secondary | ICD-10-CM | POA: Diagnosis not present

## 2020-10-19 DIAGNOSIS — C7A090 Malignant carcinoid tumor of the bronchus and lung: Secondary | ICD-10-CM | POA: Diagnosis not present

## 2020-10-20 ENCOUNTER — Telehealth: Payer: Self-pay

## 2020-10-20 DIAGNOSIS — R279 Unspecified lack of coordination: Secondary | ICD-10-CM | POA: Diagnosis not present

## 2020-10-20 DIAGNOSIS — R5383 Other fatigue: Secondary | ICD-10-CM | POA: Diagnosis not present

## 2020-10-20 DIAGNOSIS — R2681 Unsteadiness on feet: Secondary | ICD-10-CM | POA: Diagnosis not present

## 2020-10-20 DIAGNOSIS — M6281 Muscle weakness (generalized): Secondary | ICD-10-CM | POA: Diagnosis not present

## 2020-10-20 NOTE — Telephone Encounter (Signed)
Anderson Malta, RN with [facility], where pt resides right now, wanted to confirm pts Tagrisso dosage. I confirmed pt it to take two 80mg  tablets once daily for a total of 160mg  daily. Anderson Malta expressed understanding of this and thanked [place] for the confirmation.

## 2020-10-21 ENCOUNTER — Other Ambulatory Visit: Payer: Self-pay

## 2020-10-21 ENCOUNTER — Encounter: Payer: Self-pay | Admitting: Internal Medicine

## 2020-10-21 ENCOUNTER — Ambulatory Visit
Admission: RE | Admit: 2020-10-21 | Discharge: 2020-10-21 | Disposition: A | Discharge status: Home / Self Care | Payer: Medicare Other | Source: Ambulatory Visit | Attending: Internal Medicine | Admitting: Internal Medicine

## 2020-10-21 DIAGNOSIS — C7931 Secondary malignant neoplasm of brain: Secondary | ICD-10-CM | POA: Diagnosis not present

## 2020-10-21 DIAGNOSIS — C7949 Secondary malignant neoplasm of other parts of nervous system: Secondary | ICD-10-CM

## 2020-10-21 DIAGNOSIS — C3490 Malignant neoplasm of unspecified part of unspecified bronchus or lung: Secondary | ICD-10-CM | POA: Diagnosis not present

## 2020-10-21 DIAGNOSIS — G939 Disorder of brain, unspecified: Secondary | ICD-10-CM | POA: Diagnosis not present

## 2020-10-21 MED ORDER — GADOBENATE DIMEGLUMINE 529 MG/ML IV SOLN
15.0000 mL | Freq: Once | INTRAVENOUS | Status: AC | PRN
  Administered 2020-10-21: 15 mL via INTRAVENOUS

## 2020-10-24 DIAGNOSIS — R296 Repeated falls: Secondary | ICD-10-CM | POA: Diagnosis not present

## 2020-10-24 DIAGNOSIS — M6281 Muscle weakness (generalized): Secondary | ICD-10-CM | POA: Diagnosis not present

## 2020-10-24 DIAGNOSIS — C7A090 Malignant carcinoid tumor of the bronchus and lung: Secondary | ICD-10-CM | POA: Diagnosis not present

## 2020-10-25 DIAGNOSIS — R5383 Other fatigue: Secondary | ICD-10-CM | POA: Diagnosis not present

## 2020-10-25 DIAGNOSIS — R2681 Unsteadiness on feet: Secondary | ICD-10-CM | POA: Diagnosis not present

## 2020-10-25 DIAGNOSIS — M6281 Muscle weakness (generalized): Secondary | ICD-10-CM | POA: Diagnosis not present

## 2020-10-25 DIAGNOSIS — R279 Unspecified lack of coordination: Secondary | ICD-10-CM | POA: Diagnosis not present

## 2020-10-27 ENCOUNTER — Other Ambulatory Visit: Payer: Self-pay

## 2020-10-27 ENCOUNTER — Inpatient Hospital Stay: Payer: Medicare Other | Attending: Internal Medicine | Admitting: Internal Medicine

## 2020-10-27 ENCOUNTER — Encounter: Payer: Self-pay | Admitting: Internal Medicine

## 2020-10-27 ENCOUNTER — Ambulatory Visit: Payer: Medicare HMO | Admitting: Internal Medicine

## 2020-10-27 VITALS — BP 124/59 | HR 64 | Temp 96.9°F | Resp 18 | Ht 65.0 in | Wt 143.8 lb

## 2020-10-27 DIAGNOSIS — Z7901 Long term (current) use of anticoagulants: Secondary | ICD-10-CM | POA: Insufficient documentation

## 2020-10-27 DIAGNOSIS — Z86711 Personal history of pulmonary embolism: Secondary | ICD-10-CM | POA: Diagnosis not present

## 2020-10-27 DIAGNOSIS — R296 Repeated falls: Secondary | ICD-10-CM | POA: Diagnosis not present

## 2020-10-27 DIAGNOSIS — C7949 Secondary malignant neoplasm of other parts of nervous system: Secondary | ICD-10-CM

## 2020-10-27 DIAGNOSIS — I129 Hypertensive chronic kidney disease with stage 1 through stage 4 chronic kidney disease, or unspecified chronic kidney disease: Secondary | ICD-10-CM | POA: Insufficient documentation

## 2020-10-27 DIAGNOSIS — C7951 Secondary malignant neoplasm of bone: Secondary | ICD-10-CM | POA: Insufficient documentation

## 2020-10-27 DIAGNOSIS — C3432 Malignant neoplasm of lower lobe, left bronchus or lung: Secondary | ICD-10-CM | POA: Diagnosis not present

## 2020-10-27 DIAGNOSIS — C7931 Secondary malignant neoplasm of brain: Secondary | ICD-10-CM | POA: Insufficient documentation

## 2020-10-27 DIAGNOSIS — Z79899 Other long term (current) drug therapy: Secondary | ICD-10-CM | POA: Insufficient documentation

## 2020-10-27 DIAGNOSIS — M6281 Muscle weakness (generalized): Secondary | ICD-10-CM | POA: Diagnosis not present

## 2020-10-27 DIAGNOSIS — C7A090 Malignant carcinoid tumor of the bronchus and lung: Secondary | ICD-10-CM | POA: Diagnosis not present

## 2020-10-27 DIAGNOSIS — N1831 Chronic kidney disease, stage 3a: Secondary | ICD-10-CM | POA: Diagnosis not present

## 2020-10-27 DIAGNOSIS — Z8673 Personal history of transient ischemic attack (TIA), and cerebral infarction without residual deficits: Secondary | ICD-10-CM | POA: Diagnosis not present

## 2020-10-27 NOTE — Progress Notes (Signed)
[facility], [place] [address] [phone]   Interval Evaluation  Date of Service: 10/27/20 Patient Name: Stefanie Camacho Patient MRN: [id number] Patient DOB: 01/04/1946 Provider: Ventura Sellers, MD  Identifying Statement:  Stefanie Camacho is a 75 y.o. 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, leptomeningeal metastases   Primary Cancer:  Oncologic History: Oncology History  Malignant neoplasm of lower lobe, left bronchus or lung ([place])  01/14/2020 Initial Diagnosis   Malignant neoplasm of lower lobe, left bronchus or lung ([place])    07/26/2020 Cancer Staging   Staging form: Lung, AJCC 8th Edition - Clinical: Stage IVB (cT3, cN2, cM1c) - Signed by Curt Bears, MD on 07/26/2020    Adenocarcinoma of left lung, stage 4 ([place])  01/18/2020 Initial Diagnosis   Adenocarcinoma of left lung, stage 4 ([place])    01/28/2020 -  Chemotherapy   The patient had [No matching medication found in this treatment plan]   for chemotherapy treatment.     05/25/2020 Cancer Staging   Staging form: Lung, AJCC 8th Edition - Clinical: Stage IVB (cT3, cN2, cM1c) - Signed by Curt Bears, MD on 05/25/2020     CNS Oncologic History 02/02/20: Completes WBRT with Dr. Lisbeth Renshaw  Interval History:  Stefanie Camacho presents today for follow up after recent MRI brain.  No recurrence slurred speech episodes, or marching numbness.  Vertigo is still persistent as prior, doesn't need Antivert because episodes are generally quite brief.  No further UTI symptoms.  Continues to take Keppra 248m BID, seizure free. Still on [place]. [place]ien Nordmann    H+P (01/29/20) Patient presented to medical attention on 01/09/20 with sudden onset "marching" left sided facial numbness.  The episode drew suspicion for stroke, and during workup patient was found to have innumerable brain metastases with additional foci of leptomeningeal enhancement secondary to widely metastatic lung  cancer.  She returned to baseline and was [place] with [place] disease has since been treated with whole brain radiotherapy with Dr. MLisbeth Renshaw she is scheduled to complete treatment next week.  She has difficulty with ambulation because of a hip metastasis, but since being treated with RT that pain has improved as has her walking.  Plans to meet with Dr. MJulien Nordmannnext week to discuss plans for therapeutic targeting of EGFR mutation.    Medications: Current Outpatient Medications on File Prior to Visit  Medication Sig Dispense Refill   apixaban (ELIQUIS) 5 MG TABS tablet Take 1 tablet (5 mg total) by mouth 2 (two) times daily. 90 tablet 1   atorvastatin (LIPITOR) 10 MG tablet Take 1 tablet (10 mg total) by mouth daily at 6 PM. 90 tablet 3   Ensure Plus (ENSURE PLUS) LIQD Take 237 mLs by mouth as needed (For Nutritional Supplemntation. Unknow strength or amount).     FLUoxetine (PROZAC) 20 MG capsule Take 20 mg by mouth daily.     levETIRAcetam (KEPPRA) 250 MG tablet Take 250 mg by mouth 2 (two) times daily.     levothyroxine (SYNTHROID) 75 MCG tablet Take 1 tablet (75 mcg total) by mouth daily. 30 tablet 1   loperamide (IMODIUM) 2 MG capsule Take 2 mg by mouth as needed for diarrhea or loose stools.     losartan (COZAAR) 50 MG tablet Take 1 tablet (50 mg total) by mouth daily. 90 tablet 1   metoprolol succinate (TOPROL-XL) 50 MG 24 hr tablet Take 1.5 tablets (75 mg total) by mouth daily. Take with or immediately following  a meal. 135 tablet 1   osimertinib mesylate (TAGRISSO) 80 MG tablet Take 2 tablets (160 mg total) by mouth daily. 60 tablet 3   pantoprazole (PROTONIX) 40 MG tablet Take 1 tablet by mouth once daily (Patient taking differently: Take 40 mg by mouth daily.) 30 tablet 0   Polyethyl Glycol-Propyl Glycol (SYSTANE OP) Apply 1 drop to eye as needed (Eye dryness).     prochlorperazine (COMPAZINE) 10 MG tablet Take 1 tablet (10 mg total) by mouth every 6 (six) hours as needed for nausea  or vomiting. 45 tablet 5   spironolactone (ALDACTONE) 25 MG tablet Take 0.5 tablets (12.5 mg total) by mouth daily. (Patient taking differently: Take 25 mg by mouth daily. Skilled facility is administrating 19m) 45 tablet 3   No current facility-administered medications on file prior to visit.    Allergies:  Allergies  Allergen Reactions   Sertraline Hcl Other (See Comments)   Past Medical History:  Past Medical History:  Diagnosis Date   Acute CVA (cerebrovascular accident) ([facility] 01/09/2020   Adenocarcinoma of left lung, stage 4 ([facility] 01/18/2020   Anxiety    Blood clots in brain    Chronic kidney disease due to hypertension 05/16/2020   Chronic kidney disease, stage 3a ([facility] 05/16/2020   Chronic respiratory failure ([place] 05/16/2020   Cough 03/18/2020   Daytime somnolence 05/16/2020   Dizziness 09/25/2019   Dyslipidemia    Encounter for antineoplastic chemotherapy 01/18/2020   Essential hypertension    Gastro-esophageal reflux disease without esophagitis    Generalized weakness 03/18/2020   GERD (gastroesophageal reflux disease)    Goals of care, counseling/discussion 01/18/2020   Hardening of the aorta (main artery of the heart) ([facility] 09/08/2020   Hyperlipidemia 05/16/2020   Hypothyroidism    Hypothyroidism, unspecified    Insomnia 05/16/2020   Intracranial bleeding (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[place] 01/29/2020   Long term (current) use of anticoagulants 05/16/2020   Lumbar foraminal stenosis 12/30/2019   Lung mass 05/16/2020   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[facility] 05/16/2020   Major depression, single episode 05/16/2020   Malignant neoplasm of lower lobe, left bronchus or lung ([facility] 01/14/2020   NICM (nonischemic cardiomyopathy) ([facility]    Nonischemic cardiomyopathy ([facility] 06/04/2018   Ejection fraction 45% in summer 2019   Nonobstructive cardiomyopathy ([facility]    Obstructive sleep apnea syndrome    Recurrent falls 07/12/2020   Rosacea  05/16/2020   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[facility] 01/28/2020   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[facility] 01/29/2020   Secondary malignant neoplasm of intrathoracic lymph nodes ([facility] 05/16/2020   Single subsegmental pulmonary embolism without acute cor pulmonale ([facility] [place] 03/25/2020   Sleep apnea    Stress incontinence (female) (female) 05/16/2020   TIA (transient ischemic attack) 01/08/2020   Vertigo 09/25/2019   Past Surgical History:  Past Surgical History:  Procedure Laterality Date   ABDOMINAL HYSTERECTOMY     BLADDER NECK RECONSTRUCTION     BRONCHIAL BIOPSY  01/12/2020   Procedure: BRONCHIAL BIOPSIES;  Surgeon: IGarner Nash DO;  [facility];  Service: Pulmonary;;   BRONCHIAL BRUSHINGS  01/12/2020   Procedure: BRONCHIAL BRUSHINGS;  Surgeon: IGarner Nash DO;  [facility]  Service: Pulmonary;;   BRONCHIAL NEEDLE ASPIRATION BIOPSY  01/12/2020   Procedure: BRONCHIAL NEEDLE ASPIRATION BIOPSIES;  Surgeon: IGarner Nash DO;  [facility]  Service: Pulmonary;;   BRONCHIAL WASHINGS  01/12/2020   Procedure: BRONCHIAL WASHINGS;  Surgeon: Garner Nash, DO;  [facility];  Service: Pulmonary;;   SHOULDER SURGERY     TONSILLECTOMY     VIDEO BRONCHOSCOPY WITH ENDOBRONCHIAL ULTRASOUND  01/12/2020   Procedure: VIDEO BRONCHOSCOPY WITH ENDOBRONCHIAL ULTRASOUND;  Surgeon: Garner Nash, DO;  [facility];  Service: Pulmonary;;   Social History:  Social History   Socioeconomic History   Marital status: Divorced    Spouse name: Not on file   Number of children: Not on file   Years of education: Not on file   Highest education level: Not on file  Occupational History   Not on file  Tobacco Use   Smoking status: Never   Smokeless tobacco: Never  Vaping Use   Vaping Use: Never used  Substance and Sexual Activity   Alcohol use: Not Currently   Drug use: Never   Sexual activity: Not on file  Other Topics Concern   Not on  file  Social History Narrative   Not on file   Social Determinants of Health   Financial Resource Strain: Not on file  Food Insecurity: Not on file  Transportation Needs: Not on file  Physical Activity: Not on file  Stress: Not on file  Social Connections: Not on file  Intimate Partner Violence: Not on file   Family History:  Family History  Problem Relation Age of Onset   Heart disease Mother    Stroke Mother    Heart disease Father    Stroke Father    Heart disease Maternal Grandfather    Heart disease Paternal Grandfather     Review of Systems: Constitutional: Doesn't report fevers, chills or abnormal weight loss Eyes: Doesn't report blurriness of vision Ears, nose, mouth, throat, and face: Doesn't report sore throat Respiratory: Doesn't report cough, dyspnea or wheezes Cardiovascular: Doesn't report palpitation, chest discomfort  Gastrointestinal:  Doesn't report nausea, constipation, diarrhea GU: +dysuria Skin: Doesn't report skin rashes Neurological: Per HPI Musculoskeletal: Doesn't report joint pain Behavioral/Psych: Doesn't report anxiety  Physical Exam: Vitals:   10/27/20 1409  BP: (!) 124/59  Pulse: 64  Resp: 18  Temp: (!) 96.9 F (36.1 C)  SpO2: 97%   KPS: 80. General: Alert, cooperative, pleasant, in no acute distress Head: Normal EENT: No conjunctival injection or scleral icterus.  Lungs: Resp effort normal Cardiac: Regular rate Abdomen: Non-distended abdomen Skin: No rashes cyanosis or petechiae. Extremities: No clubbing or edema  Neurologic Exam: Mental Status: Awake, alert, attentive to examiner. Oriented to self and environment. Language is fluent with intact comprehension.  Cranial Nerves: Visual acuity is grossly normal. Visual fields are full. Extra-ocular movements intact. No ptosis. Face is symmetric Motor: Tone and bulk are normal. Power is full in both arms and legs. Reflexes are symmetric, no pathologic reflexes present.  Sensory:  Intact to light touch Gait: Orthopedic limitation  Labs: I have reviewed the data as listed    Component Value Date/Time   NA 140 09/26/2020 0906   K 4.3 09/26/2020 0906   CL 104 09/26/2020 0906   CO2 27 09/26/2020 0906   GLUCOSE 95 09/26/2020 0906   BUN 16 09/26/2020 0906   CREATININE 1.12 (H) 09/26/2020 0906   CALCIUM 10.1 09/26/2020 0906   PROT 6.7 09/26/2020 0906   PROT 6.9 07/17/2018 0934   ALBUMIN 3.7 09/26/2020 0906   ALBUMIN 4.1 07/17/2018 0934   AST 12 (L) 09/26/2020 0906   ALT 9 09/26/2020 0906   ALKPHOS 130 (H) 09/26/2020 0906   BILITOT 0.9 09/26/2020 3614  GFRNONAA 52 (L) 09/26/2020 0906   GFRAA >60 02/01/2020 1419   Lab Results  Component Value Date   WBC 4.7 09/26/2020   NEUTROABS 3.4 09/26/2020   HGB 12.3 09/26/2020   HCT 36.7 09/26/2020   MCV 93.6 09/26/2020   PLT 149 (L) 09/26/2020   Imaging:  [place] Clinician Interpretation: I have personally reviewed the CNS images as listed.  My interpretation, in the context of the patient's clinical presentation, is stable disease  MR BRAIN W WO CONTRAST  Result Date: 10/21/2020 CLINICAL DATA:  Brain CNS neoplasm. Assess treatment response. Lung cancer with brain and leptomeningeal metastases. Status post whole brain radiation. EXAM: MRI HEAD WITHOUT AND WITH CONTRAST TECHNIQUE: Multiplanar, multiecho pulse sequences of the brain and surrounding structures were obtained without and with intravenous contrast. CONTRAST:  81m MULTIHANCE GADOBENATE DIMEGLUMINE 529 MG/ML IV SOLN COMPARISON:  Head without and with contrast 07/21/2020 and 04/22/2020 FINDINGS: Brain: Areas of leptomeningeal enhancement are stable to slightly decreased relative to the prior exam. No new lesions are present. Focal area of enhancement in the inferior temporal lobe on image 56 of series 12 now measures 7 mm compared with 8 mm previously. Posterior temporal and occipital lesion measures 4.5 mm. With 7 mm on the prior exam. Punctate focus again noted  in the right cerebellum. Focal leptomeningeal enhancement in the central sulcus on the left on image 115 of series 12 is less conspicuous than on the prior exam. No new lesions are present. No acute infarct or hemorrhage is present. The ventricles are of normal size. No significant extraaxial fluid collection is present. The internal auditory canals are within normal limits. The brainstem and cerebellum are within normal limits. Vascular: Flow is present in the major intracranial arteries. Skull and upper cervical spine: The craniocervical junction is normal. Upper cervical spine is within normal limits. Marrow signal is unremarkable. Sinuses/Orbits: Bilateral mastoid effusions are present. No obstructing nasopharyngeal lesion is present. The paranasal sinuses and mastoid air cells are otherwise clear. Bilateral lens replacements are noted. Globes and orbits are otherwise unremarkable. IMPRESSION: 1. Stable to slight decrease in leptomeningeal enhancement relative to the prior exam. This is consistent with metastatic disease. 2. No new lesions are present. 3. Bilateral mastoid effusions. No obstructing nasopharyngeal lesion is present. Electronically Signed   By: CSan MorelleM.D.   On: 10/21/2020 19:56     Assessment/Plan Brain Metastases Leptomeningeal Metastases  Stefanie TAzizis clinically and radiographically stable today.  No new or progressive deficits.  Vertigo symptoms are unlikely driven by CNS process or LMD, use Meclizine PRN as prior.  Will con't Keppra 2544mBID.  We appreciate the opportunity to participate in the care of ViHollace Camacho   We ask that ViJemeka Waglereturn to clinic in 3 months following next brain MRI, or sooner as needed.  All questions were answered. The patient knows to call the clinic with any problems, questions or concerns. No barriers to learning were detected.  The total time spent in the encounter was 30 minutes and more than 50% was on counseling  and review of test results   ZaVentura SellersMD Medical Director of Neuro-Oncology [facility]6/23/22 2:16 PM

## 2020-10-28 DIAGNOSIS — R279 Unspecified lack of coordination: Secondary | ICD-10-CM | POA: Diagnosis not present

## 2020-10-28 DIAGNOSIS — R5383 Other fatigue: Secondary | ICD-10-CM | POA: Diagnosis not present

## 2020-10-28 DIAGNOSIS — R2681 Unsteadiness on feet: Secondary | ICD-10-CM | POA: Diagnosis not present

## 2020-10-28 DIAGNOSIS — M6281 Muscle weakness (generalized): Secondary | ICD-10-CM | POA: Diagnosis not present

## 2020-10-31 DIAGNOSIS — M6281 Muscle weakness (generalized): Secondary | ICD-10-CM | POA: Diagnosis not present

## 2020-10-31 DIAGNOSIS — R279 Unspecified lack of coordination: Secondary | ICD-10-CM | POA: Diagnosis not present

## 2020-10-31 DIAGNOSIS — R5383 Other fatigue: Secondary | ICD-10-CM | POA: Diagnosis not present

## 2020-10-31 DIAGNOSIS — R2681 Unsteadiness on feet: Secondary | ICD-10-CM | POA: Diagnosis not present

## 2020-11-01 ENCOUNTER — Inpatient Hospital Stay: Payer: Medicare Other | Admitting: Internal Medicine

## 2020-11-01 ENCOUNTER — Other Ambulatory Visit: Payer: Self-pay

## 2020-11-01 ENCOUNTER — Inpatient Hospital Stay: Payer: Medicare Other

## 2020-11-01 ENCOUNTER — Encounter: Payer: Self-pay | Admitting: Internal Medicine

## 2020-11-01 VITALS — BP 112/53 | HR 63 | Temp 97.2°F | Resp 18 | Ht 65.0 in | Wt 143.7 lb

## 2020-11-01 DIAGNOSIS — C3492 Malignant neoplasm of unspecified part of left bronchus or lung: Secondary | ICD-10-CM

## 2020-11-01 DIAGNOSIS — Z86711 Personal history of pulmonary embolism: Secondary | ICD-10-CM | POA: Diagnosis not present

## 2020-11-01 DIAGNOSIS — Z8673 Personal history of transient ischemic attack (TIA), and cerebral infarction without residual deficits: Secondary | ICD-10-CM | POA: Diagnosis not present

## 2020-11-01 DIAGNOSIS — N1831 Chronic kidney disease, stage 3a: Secondary | ICD-10-CM | POA: Diagnosis not present

## 2020-11-01 DIAGNOSIS — C7932 Secondary malignant neoplasm of cerebral meninges: Secondary | ICD-10-CM | POA: Diagnosis not present

## 2020-11-01 DIAGNOSIS — I129 Hypertensive chronic kidney disease with stage 1 through stage 4 chronic kidney disease, or unspecified chronic kidney disease: Secondary | ICD-10-CM | POA: Diagnosis not present

## 2020-11-01 DIAGNOSIS — C3432 Malignant neoplasm of lower lobe, left bronchus or lung: Secondary | ICD-10-CM | POA: Diagnosis not present

## 2020-11-01 DIAGNOSIS — M6281 Muscle weakness (generalized): Secondary | ICD-10-CM | POA: Diagnosis not present

## 2020-11-01 DIAGNOSIS — C7931 Secondary malignant neoplasm of brain: Secondary | ICD-10-CM | POA: Diagnosis not present

## 2020-11-01 DIAGNOSIS — C7A090 Malignant carcinoid tumor of the bronchus and lung: Secondary | ICD-10-CM | POA: Diagnosis not present

## 2020-11-01 DIAGNOSIS — C7951 Secondary malignant neoplasm of bone: Secondary | ICD-10-CM | POA: Diagnosis not present

## 2020-11-01 DIAGNOSIS — R296 Repeated falls: Secondary | ICD-10-CM | POA: Diagnosis not present

## 2020-11-01 DIAGNOSIS — Z5111 Encounter for antineoplastic chemotherapy: Secondary | ICD-10-CM

## 2020-11-01 DIAGNOSIS — Z7901 Long term (current) use of anticoagulants: Secondary | ICD-10-CM | POA: Diagnosis not present

## 2020-11-01 DIAGNOSIS — Z79899 Other long term (current) drug therapy: Secondary | ICD-10-CM | POA: Diagnosis not present

## 2020-11-01 LAB — CMP (CANCER CENTER ONLY)
ALT: 8 U/L (ref 0–44)
AST: 13 U/L — ABNORMAL LOW (ref 15–41)
Albumin: 3.6 g/dL (ref 3.5–5.0)
Alkaline Phosphatase: 120 U/L (ref 38–126)
Anion gap: 9 (ref 5–15)
BUN: 17 mg/dL (ref 8–23)
CO2: 25 mmol/L (ref 22–32)
Calcium: 9.8 mg/dL (ref 8.9–10.3)
Chloride: 105 mmol/L (ref 98–111)
Creatinine: 1.06 mg/dL — ABNORMAL HIGH (ref 0.44–1.00)
GFR, Estimated: 55 mL/min — ABNORMAL LOW (ref >60)
Glucose, Bld: 81 mg/dL (ref 70–99)
Potassium: 3.9 mmol/L (ref 3.5–5.1)
Sodium: 139 mmol/L (ref 135–145)
Total Bilirubin: 0.6 mg/dL (ref 0.3–1.2)
Total Protein: 6.6 g/dL (ref 6.5–8.1)

## 2020-11-01 LAB — CBC WITH DIFFERENTIAL (CANCER CENTER ONLY)
Abs Immature Granulocytes: 0 10*3/uL (ref 0.00–0.07)
Basophils Absolute: 0 10*3/uL (ref 0.0–0.1)
Basophils Relative: 0 %
Eosinophils Absolute: 0 10*3/uL (ref 0.0–0.5)
Eosinophils Relative: 1 %
HCT: 35.4 % — ABNORMAL LOW (ref 36.0–46.0)
Hemoglobin: 12 g/dL (ref 12.0–15.0)
Immature Granulocytes: 0 %
Lymphocytes Relative: 20 %
Lymphs Abs: 0.8 10*3/uL (ref 0.7–4.0)
MCH: 31.7 pg (ref 26.0–34.0)
MCHC: 33.9 g/dL (ref 30.0–36.0)
MCV: 93.7 fL (ref 80.0–100.0)
Monocytes Absolute: 0.4 10*3/uL (ref 0.1–1.0)
Monocytes Relative: 9 %
Neutro Abs: 2.9 10*3/uL (ref 1.7–7.7)
Neutrophils Relative %: 70 %
Platelet Count: 112 10*3/uL — ABNORMAL LOW (ref 150–400)
RBC: 3.78 MIL/uL — ABNORMAL LOW (ref 3.87–5.11)
RDW: 13.8 % (ref 11.5–15.5)
WBC Count: 4.1 10*3/uL (ref 4.0–10.5)
nRBC: 0 % (ref 0.0–0.2)

## 2020-11-01 NOTE — Progress Notes (Signed)
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 2) pulmonary embolus in the right main pulmonary artery diagnosed in November 2021.   Biomarker Findings Microsatellite status - MS-Stable Tumor Mutational Burden - 1 Muts/Mb Genomic Findings For a complete list of the genes assayed, please refer to the Appendix. EGFR exon 19 deletion ([id number]) 7 Disease relevant genes with no reportable alterations: ALK, BRAF, ERBB2, KRAS, MET, RET, ROS1  PDL1 expression: 2%.   PRIOR THERAPY: Whole brain radiation and radiotherapy to the osseous metastases under the care of Dr. Lisbeth Renshaw. Last treatment expected on 02/02/20   CURRENT THERAPY:  1) Tagrisso 160 mg p.o. daily. First dose on 02/01/20.  Status post 9 months of treatment. 2) Eliquis 5 mg p.o. twice daily  INTERVAL HISTORY: Stefanie Camacho 75 y.o. female returns to the clinic today for follow-up visit accompanied by her son.  The patient is feeling fine today with no concerning complaints except for increased burping and occasional diarrhea.  She is currently on Protonix for the GERD.  She does not use Imodium as prescribed.  Her diarrhea happened to 3 times but only once or twice a week.  She denied having any current chest pain, shortness of breath, cough or hemoptysis.  She denied having any fever or chills.  She has no nausea, vomiting, abdominal pain or constipation.  She continues to tolerate her treatment with Tagrisso fairly well.  She is here today for evaluation and repeat blood  work.  MEDICAL HISTORY: Past Medical History:  Diagnosis Date   Acute CVA (cerebrovascular accident) ([place]) 01/09/2020   Adenocarcinoma of left lung, stage 4 ([place]) 01/18/2020   Anxiety    Blood clots in brain    Chronic kidney disease due to hypertension 05/16/2020   Chronic kidney disease, stage 3a ([place]) 05/16/2020   Chronic respiratory failure ([place]) 05/16/2020   Cough 03/18/2020   Daytime somnolence 05/16/2020   Dizziness 09/25/2019   Dyslipidemia    Encounter for antineoplastic chemotherapy 01/18/2020   Essential hypertension    Gastro-esophageal reflux disease without esophagitis    Generalized weakness 03/18/2020   GERD (gastroesophageal reflux disease)    Goals of care, counseling/discussion 01/18/2020   Hardening of the aorta (main artery of the heart) ([place]) 09/08/2020   Hyperlipidemia 05/16/2020   Hypothyroidism    Hypothyroidism, unspecified    Insomnia 05/16/2020   Intracranial bleeding ([place]) 01/09/2020   Left bundle branch block 06/04/2018   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   Long term (current) use of anticoagulants 05/16/2020   Lumbar foraminal stenosis 12/30/2019   Lung mass 05/16/2020   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[place]) 05/16/2020   Major depression, single episode 05/16/2020   Malignant neoplasm of lower lobe, left bronchus or lung ([place]) 01/14/2020   NICM (nonischemic cardiomyopathy) ([place])    Nonischemic cardiomyopathy ([place]) 06/04/2018   Ejection fraction 45% in summer 2019   Nonobstructive cardiomyopathy ([place])    Obstructive sleep apnea syndrome    Recurrent falls 07/12/2020   Rosacea 05/16/2020   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  MEDICATIONS:  Current Outpatient Medications  Medication Sig Dispense Refill   apixaban (ELIQUIS) 5 MG TABS tablet Take 1 tablet (5 mg total) by mouth 2 (two) times daily. 90 tablet 1   atorvastatin (LIPITOR) 10 MG tablet Take 1 tablet (10 mg total) by mouth daily at 6 PM. 90 tablet 3   Ensure Plus (ENSURE PLUS) LIQD Take 237 mLs by mouth as needed (For Nutritional Supplemntation. Unknow strength or amount).     FLUoxetine (PROZAC) 20 MG capsule Take 20 mg by mouth daily.     levETIRAcetam (KEPPRA) 250 MG tablet Take 250 mg by mouth 2 (two) times daily.     levothyroxine (SYNTHROID) 75 MCG tablet Take 1 tablet (75 mcg total) by mouth daily. 30 tablet 1   loperamide (IMODIUM) 2 MG capsule Take 2 mg by mouth as needed for diarrhea or loose stools.     losartan (COZAAR) 50 MG tablet Take 1 tablet (50 mg total) by mouth daily. 90 tablet 1   metoprolol succinate (TOPROL-XL) 50 MG 24 hr tablet Take 1.5 tablets (75 mg total) by mouth daily. Take with or immediately following a meal. 135 tablet 1   osimertinib mesylate (TAGRISSO) 80 MG tablet Take 2 tablets (160 mg total) by mouth daily. 60 tablet 3   pantoprazole (PROTONIX) 40 MG tablet Take 1 tablet by mouth once daily (Patient taking differently: Take 40 mg by mouth daily.) 30 tablet 0   Polyethyl Glycol-Propyl Glycol (SYSTANE OP) Apply 1 drop to eye as needed (Eye dryness).     prochlorperazine (COMPAZINE) 10 MG tablet Take 1 tablet (10 mg total) by mouth every 6 (six) hours as needed for nausea or vomiting. 45 tablet 5   spironolactone (ALDACTONE) 25 MG tablet Take 0.5 tablets (12.5 mg total) by mouth daily. (Patient taking differently: Take 25 mg by mouth daily. Skilled facility is administrating 18m) 45 tablet 3   No current facility-administered medications for this visit.    SURGICAL HISTORY:  Past Surgical History:  Procedure Laterality Date    ABDOMINAL HYSTERECTOMY     BLADDER NECK RECONSTRUCTION     BRONCHIAL BIOPSY  01/12/2020   Procedure: BRONCHIAL BIOPSIES;  Surgeon: IGarner Nash DO;  [facility];  Service: Pulmonary;;   BRONCHIAL BRUSHINGS  01/12/2020   Procedure: BRONCHIAL BRUSHINGS;  Surgeon: IGarner Nash DO;  [facility];  Service: Pulmonary;;   BRONCHIAL NEEDLE ASPIRATION BIOPSY  01/12/2020   Procedure: BRONCHIAL NEEDLE ASPIRATION BIOPSIES;  Surgeon: IGarner Nash DO;  [facility]  Service: Pulmonary;;   BRONCHIAL WASHINGS  01/12/2020   Procedure: BRONCHIAL WASHINGS;  Surgeon: IGarner Nash DO;  [facility]  Service: Pulmonary;;   SHOULDER SURGERY     TONSILLECTOMY     VIDEO BRONCHOSCOPY WITH ENDOBRONCHIAL ULTRASOUND  01/12/2020   Procedure: VIDEO BRONCHOSCOPY WITH ENDOBRONCHIAL ULTRASOUND;  Surgeon: IGarner Nash DO;  [facility];  Service: Pulmonary;;    REVIEW OF SYSTEMS:  A comprehensive review of systems was negative except for: Constitutional: positive for fatigue Gastrointestinal: positive for diarrhea and dyspepsia   PHYSICAL EXAMINATION: General appearance: alert, cooperative, fatigued, and  no distress Head: Normocephalic, without obvious abnormality, atraumatic Neck: no adenopathy, no JVD, supple, symmetrical, trachea midline, and thyroid not enlarged, symmetric, no tenderness/mass/nodules Lymph nodes: Cervical, supraclavicular, and axillary nodes normal. Resp: clear to auscultation bilaterally Back: symmetric, no curvature. ROM normal. No CVA tenderness. Cardio: regular rate and rhythm, S1, S2 normal, no murmur, click, rub or gallop GI: soft, non-tender; bowel sounds normal; no masses,  no organomegaly Extremities: extremities normal, atraumatic, no cyanosis or edema  ECOG PERFORMANCE STATUS: 1 - Symptomatic but completely ambulatory  Blood pressure (!) 112/53, pulse 63, temperature (!) 97.2 F (36.2 C), temperature source Tympanic,  resp. rate 18, height '5\' 5"'  (1.651 m), weight 143 lb 11.2 oz (65.2 kg), SpO2 95 %.  LABORATORY DATA: Lab Results  Component Value Date   WBC 4.7 09/26/2020   HGB 12.3 09/26/2020   HCT 36.7 09/26/2020   MCV 93.6 09/26/2020   PLT 149 (L) 09/26/2020      Chemistry      Component Value Date/Time   NA 140 09/26/2020 0906   K 4.3 09/26/2020 0906   CL 104 09/26/2020 0906   CO2 27 09/26/2020 0906   BUN 16 09/26/2020 0906   CREATININE 1.12 (H) 09/26/2020 0906      Component Value Date/Time   CALCIUM 10.1 09/26/2020 0906   ALKPHOS 130 (H) 09/26/2020 0906   AST 12 (L) 09/26/2020 0906   ALT 9 09/26/2020 0906   BILITOT 0.9 09/26/2020 0906       RADIOGRAPHIC STUDIES: MR BRAIN W WO CONTRAST  Result Date: 10/21/2020 CLINICAL DATA:  Brain CNS neoplasm. Assess treatment response. Lung cancer with brain and leptomeningeal metastases. Status post whole brain radiation. EXAM: MRI HEAD WITHOUT AND WITH CONTRAST TECHNIQUE: Multiplanar, multiecho pulse sequences of the brain and surrounding structures were obtained without and with intravenous contrast. CONTRAST:  68m MULTIHANCE GADOBENATE DIMEGLUMINE 529 MG/ML IV SOLN COMPARISON:  Head without and with contrast 07/21/2020 and 04/22/2020 FINDINGS: Brain: Areas of leptomeningeal enhancement are stable to slightly decreased relative to the prior exam. No new lesions are present. Focal area of enhancement in the inferior temporal lobe on image 56 of series 12 now measures 7 mm compared with 8 mm previously. Posterior temporal and occipital lesion measures 4.5 mm. With 7 mm on the prior exam. Punctate focus again noted in the right cerebellum. Focal leptomeningeal enhancement in the central sulcus on the left on image 115 of series 12 is less conspicuous than on the prior exam. No new lesions are present. No acute infarct or hemorrhage is present. The ventricles are of normal size. No significant extraaxial fluid collection is present. The internal auditory  canals are within normal limits. The brainstem and cerebellum are within normal limits. Vascular: Flow is present in the major intracranial arteries. Skull and upper cervical spine: The craniocervical junction is normal. Upper cervical spine is within normal limits. Marrow signal is unremarkable. Sinuses/Orbits: Bilateral mastoid effusions are present. No obstructing nasopharyngeal lesion is present. The paranasal sinuses and mastoid air cells are otherwise clear. Bilateral lens replacements are noted. Globes and orbits are otherwise unremarkable. IMPRESSION: 1. Stable to slight decrease in leptomeningeal enhancement relative to the prior exam. This is consistent with metastatic disease. 2. No new lesions are present. 3. Bilateral mastoid effusions. No obstructing nasopharyngeal lesion is present. Electronically Signed   By: CSan MorelleM.D.   On: 10/21/2020 19:56     ASSESSMENT AND PLAN: This is a very pleasant 75years old white female recently diagnosed with  a stage IV non-small cell lung cancer, adenocarcinoma in September 2021 and presented with left lower lobe lung mass in addition to extensive bilateral pulmonary metastasis and anterior mediastinal lymphadenopathy as well as a skeletal metastasis and metastatic disease to the brain with leptomeningeal disease.  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 She underwent whole brain irradiation and currently undergoing treatment with targeted therapy with Tagrisso 160 mg p.o. daily because of the leptomeningeal disease status post 9 months of treatment.  The patient continues to tolerate her treatment fairly well with no concerning adverse effects except for occasional episodes of diarrhea. I recommended for her to continue her treatment with Tagrisso with the same dose for now. I will see her back for follow-up visit in 1 months for evaluation and repeat blood work. I advised her to  take her Protonix in the evening and continue with the Tagrisso in the morning. For the diarrhea, she was advised to take Imodium on as-needed basis. For the history of leptomeningeal disease and brain metastasis, she will continue her routine follow-up visit and evaluation by Dr. Mickeal Skinner. For the pulmonary embolism she is currently on Eliquis. For the history of depression the patient will continue her current treatment with Prozac. The patient was advised to call immediately if she has any concerning symptoms in the interval. The patient voices understanding of current disease status and treatment options and is in agreement with the current care plan.  All questions were answered. The patient knows to call the clinic with any problems, questions or concerns. We can certainly see the patient much sooner if necessary.  Disclaimer: This note was dictated with voice recognition software. Similar sounding words can inadvertently be transcribed and may not be corrected upon review.

## 2020-11-02 ENCOUNTER — Other Ambulatory Visit: Payer: Self-pay | Admitting: Radiation Therapy

## 2020-11-02 ENCOUNTER — Telehealth: Payer: Self-pay | Admitting: Medical Oncology

## 2020-11-02 DIAGNOSIS — M6281 Muscle weakness (generalized): Secondary | ICD-10-CM | POA: Diagnosis not present

## 2020-11-02 DIAGNOSIS — C7A090 Malignant carcinoid tumor of the bronchus and lung: Secondary | ICD-10-CM | POA: Diagnosis not present

## 2020-11-02 DIAGNOSIS — R296 Repeated falls: Secondary | ICD-10-CM | POA: Diagnosis not present

## 2020-11-02 NOTE — Telephone Encounter (Signed)
Staff nurse reported that pt takes aldactone 25 mg /day.

## 2020-11-02 NOTE — Addendum Note (Signed)
Addended by: Ardeen Garland on: 11/02/2020 09:37 AM   Modules accepted: Orders

## 2020-11-03 ENCOUNTER — Telehealth: Payer: Self-pay | Admitting: Internal Medicine

## 2020-11-03 DIAGNOSIS — C7A090 Malignant carcinoid tumor of the bronchus and lung: Secondary | ICD-10-CM | POA: Diagnosis not present

## 2020-11-03 DIAGNOSIS — M6281 Muscle weakness (generalized): Secondary | ICD-10-CM | POA: Diagnosis not present

## 2020-11-03 DIAGNOSIS — R296 Repeated falls: Secondary | ICD-10-CM | POA: Diagnosis not present

## 2020-11-03 NOTE — Telephone Encounter (Signed)
Scheduled per los. Called. Not able to leave msg. Mailed printout

## 2020-11-04 DIAGNOSIS — R2681 Unsteadiness on feet: Secondary | ICD-10-CM | POA: Diagnosis not present

## 2020-11-04 DIAGNOSIS — R279 Unspecified lack of coordination: Secondary | ICD-10-CM | POA: Diagnosis not present

## 2020-11-04 DIAGNOSIS — M6281 Muscle weakness (generalized): Secondary | ICD-10-CM | POA: Diagnosis not present

## 2020-11-04 DIAGNOSIS — R5383 Other fatigue: Secondary | ICD-10-CM | POA: Diagnosis not present

## 2020-11-08 DIAGNOSIS — R296 Repeated falls: Secondary | ICD-10-CM | POA: Diagnosis not present

## 2020-11-08 DIAGNOSIS — R5383 Other fatigue: Secondary | ICD-10-CM | POA: Diagnosis not present

## 2020-11-08 DIAGNOSIS — M6281 Muscle weakness (generalized): Secondary | ICD-10-CM | POA: Diagnosis not present

## 2020-11-08 DIAGNOSIS — C7A090 Malignant carcinoid tumor of the bronchus and lung: Secondary | ICD-10-CM | POA: Diagnosis not present

## 2020-11-08 DIAGNOSIS — R2681 Unsteadiness on feet: Secondary | ICD-10-CM | POA: Diagnosis not present

## 2020-11-08 DIAGNOSIS — R279 Unspecified lack of coordination: Secondary | ICD-10-CM | POA: Diagnosis not present

## 2020-11-09 DIAGNOSIS — R5383 Other fatigue: Secondary | ICD-10-CM | POA: Diagnosis not present

## 2020-11-09 DIAGNOSIS — M6281 Muscle weakness (generalized): Secondary | ICD-10-CM | POA: Diagnosis not present

## 2020-11-09 DIAGNOSIS — R279 Unspecified lack of coordination: Secondary | ICD-10-CM | POA: Diagnosis not present

## 2020-11-09 DIAGNOSIS — R2681 Unsteadiness on feet: Secondary | ICD-10-CM | POA: Diagnosis not present

## 2020-11-10 DIAGNOSIS — C7A090 Malignant carcinoid tumor of the bronchus and lung: Secondary | ICD-10-CM | POA: Diagnosis not present

## 2020-11-10 DIAGNOSIS — M6281 Muscle weakness (generalized): Secondary | ICD-10-CM | POA: Diagnosis not present

## 2020-11-10 DIAGNOSIS — R296 Repeated falls: Secondary | ICD-10-CM | POA: Diagnosis not present

## 2020-11-11 DIAGNOSIS — M6281 Muscle weakness (generalized): Secondary | ICD-10-CM | POA: Diagnosis not present

## 2020-11-11 DIAGNOSIS — R296 Repeated falls: Secondary | ICD-10-CM | POA: Diagnosis not present

## 2020-11-11 DIAGNOSIS — C7A090 Malignant carcinoid tumor of the bronchus and lung: Secondary | ICD-10-CM | POA: Diagnosis not present

## 2020-11-14 DIAGNOSIS — M6281 Muscle weakness (generalized): Secondary | ICD-10-CM | POA: Diagnosis not present

## 2020-11-14 DIAGNOSIS — R2681 Unsteadiness on feet: Secondary | ICD-10-CM | POA: Diagnosis not present

## 2020-11-14 DIAGNOSIS — R5383 Other fatigue: Secondary | ICD-10-CM | POA: Diagnosis not present

## 2020-11-14 DIAGNOSIS — R279 Unspecified lack of coordination: Secondary | ICD-10-CM | POA: Diagnosis not present

## 2020-11-15 ENCOUNTER — Telehealth: Payer: Self-pay | Admitting: Internal Medicine

## 2020-11-15 DIAGNOSIS — R2681 Unsteadiness on feet: Secondary | ICD-10-CM | POA: Diagnosis not present

## 2020-11-15 DIAGNOSIS — R5383 Other fatigue: Secondary | ICD-10-CM | POA: Diagnosis not present

## 2020-11-15 DIAGNOSIS — R279 Unspecified lack of coordination: Secondary | ICD-10-CM | POA: Diagnosis not present

## 2020-11-15 DIAGNOSIS — C7A090 Malignant carcinoid tumor of the bronchus and lung: Secondary | ICD-10-CM | POA: Diagnosis not present

## 2020-11-15 DIAGNOSIS — R296 Repeated falls: Secondary | ICD-10-CM | POA: Diagnosis not present

## 2020-11-15 DIAGNOSIS — M6281 Muscle weakness (generalized): Secondary | ICD-10-CM | POA: Diagnosis not present

## 2020-11-15 NOTE — Telephone Encounter (Signed)
R/s 7/27 appt per provider on call schedule. Called, not able to leave msg. Mailed printout

## 2020-11-18 DIAGNOSIS — R296 Repeated falls: Secondary | ICD-10-CM | POA: Diagnosis not present

## 2020-11-18 DIAGNOSIS — M6281 Muscle weakness (generalized): Secondary | ICD-10-CM | POA: Diagnosis not present

## 2020-11-18 DIAGNOSIS — C7A090 Malignant carcinoid tumor of the bronchus and lung: Secondary | ICD-10-CM | POA: Diagnosis not present

## 2020-11-21 DIAGNOSIS — C7A090 Malignant carcinoid tumor of the bronchus and lung: Secondary | ICD-10-CM | POA: Diagnosis not present

## 2020-11-21 DIAGNOSIS — M6281 Muscle weakness (generalized): Secondary | ICD-10-CM | POA: Diagnosis not present

## 2020-11-21 DIAGNOSIS — R296 Repeated falls: Secondary | ICD-10-CM | POA: Diagnosis not present

## 2020-11-24 DIAGNOSIS — M6281 Muscle weakness (generalized): Secondary | ICD-10-CM | POA: Diagnosis not present

## 2020-11-24 DIAGNOSIS — R296 Repeated falls: Secondary | ICD-10-CM | POA: Diagnosis not present

## 2020-11-24 DIAGNOSIS — C7A090 Malignant carcinoid tumor of the bronchus and lung: Secondary | ICD-10-CM | POA: Diagnosis not present

## 2020-11-28 DIAGNOSIS — R296 Repeated falls: Secondary | ICD-10-CM | POA: Diagnosis not present

## 2020-11-28 DIAGNOSIS — M6281 Muscle weakness (generalized): Secondary | ICD-10-CM | POA: Diagnosis not present

## 2020-11-28 DIAGNOSIS — C7A090 Malignant carcinoid tumor of the bronchus and lung: Secondary | ICD-10-CM | POA: Diagnosis not present

## 2020-11-30 ENCOUNTER — Ambulatory Visit: Payer: Medicare Other | Admitting: Internal Medicine

## 2020-11-30 ENCOUNTER — Other Ambulatory Visit: Payer: Medicare Other

## 2020-12-01 ENCOUNTER — Telehealth: Payer: Self-pay

## 2020-12-01 DIAGNOSIS — R296 Repeated falls: Secondary | ICD-10-CM | POA: Diagnosis not present

## 2020-12-01 DIAGNOSIS — C7A090 Malignant carcinoid tumor of the bronchus and lung: Secondary | ICD-10-CM | POA: Diagnosis not present

## 2020-12-01 DIAGNOSIS — M6281 Muscle weakness (generalized): Secondary | ICD-10-CM | POA: Diagnosis not present

## 2020-12-01 NOTE — Telephone Encounter (Signed)
Pt called stating she missed a call about an hour ago. I saw no notes but advised it was likely a reminder call about her lab and OV with Dr. Julien Nordmann on Monday. Pt was advised to arrive at 1p. Pt expressed understanding of this information.

## 2020-12-05 ENCOUNTER — Other Ambulatory Visit: Payer: Self-pay

## 2020-12-05 ENCOUNTER — Inpatient Hospital Stay: Payer: Medicare Other | Admitting: Internal Medicine

## 2020-12-05 ENCOUNTER — Inpatient Hospital Stay: Payer: Medicare Other | Attending: Internal Medicine

## 2020-12-05 VITALS — BP 124/74 | HR 68 | Temp 97.0°F | Resp 18 | Ht 65.0 in | Wt 142.2 lb

## 2020-12-05 DIAGNOSIS — E039 Hypothyroidism, unspecified: Secondary | ICD-10-CM | POA: Insufficient documentation

## 2020-12-05 DIAGNOSIS — Z86711 Personal history of pulmonary embolism: Secondary | ICD-10-CM | POA: Diagnosis not present

## 2020-12-05 DIAGNOSIS — G4733 Obstructive sleep apnea (adult) (pediatric): Secondary | ICD-10-CM | POA: Diagnosis not present

## 2020-12-05 DIAGNOSIS — C3490 Malignant neoplasm of unspecified part of unspecified bronchus or lung: Secondary | ICD-10-CM

## 2020-12-05 DIAGNOSIS — Z8673 Personal history of transient ischemic attack (TIA), and cerebral infarction without residual deficits: Secondary | ICD-10-CM | POA: Diagnosis not present

## 2020-12-05 DIAGNOSIS — Z7901 Long term (current) use of anticoagulants: Secondary | ICD-10-CM | POA: Insufficient documentation

## 2020-12-05 DIAGNOSIS — C7931 Secondary malignant neoplasm of brain: Secondary | ICD-10-CM | POA: Insufficient documentation

## 2020-12-05 DIAGNOSIS — N1831 Chronic kidney disease, stage 3a: Secondary | ICD-10-CM | POA: Diagnosis not present

## 2020-12-05 DIAGNOSIS — C7949 Secondary malignant neoplasm of other parts of nervous system: Secondary | ICD-10-CM

## 2020-12-05 DIAGNOSIS — Z79899 Other long term (current) drug therapy: Secondary | ICD-10-CM | POA: Insufficient documentation

## 2020-12-05 DIAGNOSIS — C7951 Secondary malignant neoplasm of bone: Secondary | ICD-10-CM | POA: Insufficient documentation

## 2020-12-05 DIAGNOSIS — C3432 Malignant neoplasm of lower lobe, left bronchus or lung: Secondary | ICD-10-CM | POA: Diagnosis not present

## 2020-12-05 DIAGNOSIS — I129 Hypertensive chronic kidney disease with stage 1 through stage 4 chronic kidney disease, or unspecified chronic kidney disease: Secondary | ICD-10-CM | POA: Diagnosis not present

## 2020-12-05 DIAGNOSIS — Z5111 Encounter for antineoplastic chemotherapy: Secondary | ICD-10-CM

## 2020-12-05 DIAGNOSIS — C3492 Malignant neoplasm of unspecified part of left bronchus or lung: Secondary | ICD-10-CM

## 2020-12-05 LAB — CMP (CANCER CENTER ONLY)
ALT: 9 U/L (ref 0–44)
AST: 13 U/L — ABNORMAL LOW (ref 15–41)
Albumin: 3.6 g/dL (ref 3.5–5.0)
Alkaline Phosphatase: 138 U/L — ABNORMAL HIGH (ref 38–126)
Anion gap: 7 (ref 5–15)
BUN: 18 mg/dL (ref 8–23)
CO2: 28 mmol/L (ref 22–32)
Calcium: 9.8 mg/dL (ref 8.9–10.3)
Chloride: 105 mmol/L (ref 98–111)
Creatinine: 1.15 mg/dL — ABNORMAL HIGH (ref 0.44–1.00)
GFR, Estimated: 50 mL/min — ABNORMAL LOW (ref >60)
Glucose, Bld: 103 mg/dL — ABNORMAL HIGH (ref 70–99)
Potassium: 3.7 mmol/L (ref 3.5–5.1)
Sodium: 140 mmol/L (ref 135–145)
Total Bilirubin: 0.6 mg/dL (ref 0.3–1.2)
Total Protein: 6.5 g/dL (ref 6.5–8.1)

## 2020-12-05 LAB — CBC WITH DIFFERENTIAL (CANCER CENTER ONLY)
Abs Immature Granulocytes: 0.01 10*3/uL (ref 0.00–0.07)
Basophils Absolute: 0 10*3/uL (ref 0.0–0.1)
Basophils Relative: 0 %
Eosinophils Absolute: 0 10*3/uL (ref 0.0–0.5)
Eosinophils Relative: 1 %
HCT: 35.8 % — ABNORMAL LOW (ref 36.0–46.0)
Hemoglobin: 11.9 g/dL — ABNORMAL LOW (ref 12.0–15.0)
Immature Granulocytes: 0 %
Lymphocytes Relative: 24 %
Lymphs Abs: 0.9 10*3/uL (ref 0.7–4.0)
MCH: 30.9 pg (ref 26.0–34.0)
MCHC: 33.2 g/dL (ref 30.0–36.0)
MCV: 93 fL (ref 80.0–100.0)
Monocytes Absolute: 0.3 10*3/uL (ref 0.1–1.0)
Monocytes Relative: 8 %
Neutro Abs: 2.5 10*3/uL (ref 1.7–7.7)
Neutrophils Relative %: 67 %
Platelet Count: 132 10*3/uL — ABNORMAL LOW (ref 150–400)
RBC: 3.85 MIL/uL — ABNORMAL LOW (ref 3.87–5.11)
RDW: 14.2 % (ref 11.5–15.5)
WBC Count: 3.7 10*3/uL — ABNORMAL LOW (ref 4.0–10.5)
nRBC: 0 % (ref 0.0–0.2)

## 2020-12-05 NOTE — Progress Notes (Signed)
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 2) pulmonary embolus in the right main pulmonary artery diagnosed in November 2021.   Biomarker Findings Microsatellite status - MS-Stable Tumor Mutational Burden - 1 Muts/Mb Genomic Findings For a complete list of the genes assayed, please refer to the Appendix. EGFR exon 19 deletion ([id number]) 7 Disease relevant genes with no reportable alterations: Camacho, Stefanie Camacho, Stefanie Camacho, Stefanie Camacho, Stefanie Camacho, Stefanie Camacho, Stefanie Camacho  PDL1 expression: 2%.   PRIOR THERAPY: Whole brain radiation Stefanie radiotherapy to the osseous metastases under the care of Dr. Lisbeth Renshaw. Last treatment expected on 02/02/20   CURRENT THERAPY:  1) Tagrisso 160 mg p.o. daily. First dose on 02/01/20.  Status post 10 months of treatment. 2) Eliquis 5 mg p.o. twice daily  INTERVAL HISTORY: Stefanie Camacho 75 y.o. female returns to the clinic today for follow-up visit.  The patient Stefanie feeling fine today with no concerning complaints except for few intermittent episodes of diarrhea.  She takes Imodium after she has the episodes with improvement.  She denied having any current nausea, vomiting, abdominal pain or constipation.  She has no chest pain, shortness of breath, cough or hemoptysis.  She has no fever or chills.  She has no headache or visual changes.  She Stefanie here today for evaluation Stefanie repeat blood work.  MEDICAL HISTORY: Past Medical History:  Diagnosis Date   Acute CVA (cerebrovascular accident) ([facility]) 01/09/2020   Adenocarcinoma of left lung, stage 4  ([place]) 01/18/2020   Anxiety    Blood clots in brain    Chronic kidney disease due to hypertension 05/16/2020   Chronic kidney disease, stage 3a ([place]) 05/16/2020   Chronic respiratory failure ([place]) 05/16/2020   Cough 03/18/2020   Daytime somnolence 05/16/2020   Dizziness 09/25/2019   Dyslipidemia    Encounter for antineoplastic chemotherapy 01/18/2020   Essential hypertension    Gastro-esophageal reflux disease without esophagitis    Generalized weakness 03/18/2020   GERD (gastroesophageal reflux disease)    Goals of care, counseling/discussion 01/18/2020   Hardening of the aorta (main artery of the heart) ([place]) 09/08/2020   Hyperlipidemia 05/16/2020   Hypothyroidism    Hypothyroidism, unspecified    Insomnia 05/16/2020   Intracranial bleeding ([place]) 01/09/2020   Left bundle branch block 06/04/2018   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   Long term (current) use of anticoagulants 05/16/2020   Lumbar foraminal stenosis 12/30/2019   Lung mass 05/16/2020   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[facility]) 05/16/2020   Major depression, single episode 05/16/2020   Malignant neoplasm of lower lobe, left bronchus or lung ([place]) 01/14/2020   NICM (nonischemic cardiomyopathy) ([place])    Nonischemic cardiomyopathy ([place]) 06/04/2018   Ejection fraction 45% in summer 2019   Nonobstructive cardiomyopathy ([place])    Obstructive sleep apnea syndrome    Recurrent falls 07/12/2020   Rosacea 05/16/2020   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[place]) 01/28/2020   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  MEDICATIONS:  Current Outpatient Medications  Medication Sig  Dispense Refill   apixaban (ELIQUIS) 5 MG TABS tablet Take 1 tablet (5 mg total) by mouth 2 (two) times daily. 90 tablet 1   atorvastatin (LIPITOR) 10 MG tablet Take 1 tablet (10 mg total) by mouth daily at 6 PM. 90 tablet 3   Ensure Plus (ENSURE PLUS) LIQD Take 237 mLs by mouth as needed (For Nutritional Supplemntation. Unknow strength or amount).     FLUoxetine (PROZAC) 20 MG capsule Take 20 mg by mouth daily.     levETIRAcetam (KEPPRA) 250 MG tablet Take 250 mg by mouth 2 (two) times daily.     levothyroxine (SYNTHROID) 75 MCG tablet Take 1 tablet (75 mcg total) by mouth daily. 30 tablet 1   loperamide (IMODIUM) 2 MG capsule Take 2 mg by mouth as needed for diarrhea or loose stools.     losartan (COZAAR) 50 MG tablet Take 1 tablet (50 mg total) by mouth daily. 90 tablet 1   metoprolol succinate (TOPROL-XL) 50 MG 24 hr tablet Take 1.5 tablets (75 mg total) by mouth daily. Take with or immediately following a meal. 135 tablet 1   osimertinib mesylate (TAGRISSO) 80 MG tablet Take 2 tablets (160 mg total) by mouth daily. 60 tablet 3   pantoprazole (PROTONIX) 40 MG tablet Take 1 tablet by mouth once daily (Patient taking differently: Take 40 mg by mouth every evening.) 30 tablet 0   Polyethyl Glycol-Propyl Glycol (SYSTANE OP) Apply 1 drop to eye as needed (Eye dryness).     prochlorperazine (COMPAZINE) 10 MG tablet Take 1 tablet (10 mg total) by mouth every 6 (six) hours as needed for nausea or vomiting. 45 tablet 5   spironolactone (ALDACTONE) 25 MG tablet Take 25 mg by mouth daily.     No current facility-administered medications for this visit.    SURGICAL HISTORY:  Past Surgical History:  Procedure Laterality Date   ABDOMINAL HYSTERECTOMY     BLADDER NECK RECONSTRUCTION     BRONCHIAL BIOPSY  01/12/2020   Procedure: BRONCHIAL BIOPSIES;  Surgeon: Garner Nash, DO;  Location: [facility];  Service: Pulmonary;;   BRONCHIAL BRUSHINGS  01/12/2020   Procedure: BRONCHIAL BRUSHINGS;  Surgeon:  Garner Nash, DO;  Location: [facility];  Service: Pulmonary;;   BRONCHIAL NEEDLE ASPIRATION BIOPSY  01/12/2020   Procedure: BRONCHIAL NEEDLE ASPIRATION BIOPSIES;  Surgeon: Garner Nash, DO;  Location: [facility];  Service: Pulmonary;;   BRONCHIAL WASHINGS  01/12/2020   Procedure: BRONCHIAL WASHINGS;  Surgeon: Garner Nash, DO;  Location: [facility];  Service: Pulmonary;;   SHOULDER SURGERY     TONSILLECTOMY     VIDEO BRONCHOSCOPY WITH ENDOBRONCHIAL ULTRASOUND  01/12/2020   Procedure: VIDEO BRONCHOSCOPY WITH ENDOBRONCHIAL ULTRASOUND;  Surgeon: Garner Nash, DO;  [facility];  Service: Pulmonary;;    REVIEW OF SYSTEMS:  A comprehensive review of systems was negative except for: Constitutional: positive for fatigue Gastrointestinal: positive for diarrhea   PHYSICAL EXAMINATION: General appearance: alert, cooperative, fatigued, Stefanie no distress Head: Normocephalic, without obvious abnormality, atraumatic Neck: no adenopathy, no JVD, supple, symmetrical, trachea midline, Stefanie thyroid not enlarged, symmetric, no tenderness/mass/nodules Lymph nodes: Cervical, supraclavicular, Stefanie axillary nodes normal. Resp: clear to auscultation bilaterally Back: symmetric, no curvature. ROM normal. No CVA tenderness. Cardio: regular rate  Stefanie rhythm, S1, S2 normal, no murmur, click, rub or gallop GI: soft, non-tender; bowel sounds normal; no masses,  no organomegaly Extremities: extremities normal, atraumatic, no cyanosis or edema  ECOG PERFORMANCE STATUS: 1 - Symptomatic but completely ambulatory  Blood pressure 124/74, pulse 68, temperature (!) 97 F (36.1 C), temperature source Tympanic, resp. rate 18, height '5\' 5"'  (1.651 m), weight 142 lb 3.2 oz (64.5 kg), SpO2 99 %.  LABORATORY DATA: Lab Results  Component Value Date   WBC 3.7 (L) 12/05/2020   HGB 11.9 (L) 12/05/2020   HCT 35.8 (L) 12/05/2020   MCV 93.0 12/05/2020   PLT 132 (L) 12/05/2020      Chemistry      Component  Value Date/Time   NA 140 12/05/2020 1330   K 3.7 12/05/2020 1330   CL 105 12/05/2020 1330   CO2 28 12/05/2020 1330   BUN 18 12/05/2020 1330   CREATININE 1.15 (H) 12/05/2020 1330      Component Value Date/Time   CALCIUM 9.8 12/05/2020 1330   ALKPHOS 138 (H) 12/05/2020 1330   AST 13 (L) 12/05/2020 1330   ALT 9 12/05/2020 1330   BILITOT 0.6 12/05/2020 1330       RADIOGRAPHIC STUDIES: No results found.   ASSESSMENT Stefanie PLAN: This Stefanie a very pleasant 75 years old white female recently diagnosed with a stage IV non-small cell lung cancer, adenocarcinoma in September 2021 Stefanie presented with left lower lobe lung mass in addition to extensive bilateral pulmonary metastasis Stefanie anterior mediastinal lymphadenopathy as well as a skeletal metastasis Stefanie metastatic disease to the brain with leptomeningeal disease.  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 She underwent whole brain irradiation Stefanie currently undergoing treatment with targeted therapy with Tagrisso 160 mg p.o. daily because of the leptomeningeal disease status post 10 months of treatment.  The patient continues to tolerate her treatment with Tagrisso fairly well with no concerning adverse effect except for the few episodes of diarrhea weekly. I recommended for her to continue on her current treatment with Tagrisso with the same dose. I will see her back for follow-up visit in 1 months for evaluation with repeat CT scan of the chest, abdomen pelvis for restaging of her disease. For the history of leptomeningeal disease Stefanie brain metastasis, she will continue her routine follow-up visit Stefanie evaluation by Dr. Mickeal Skinner. For the pulmonary embolism she Stefanie currently on Eliquis. For the history of depression the patient will continue her current treatment with Prozac. The patient was advised to call immediately if she has any other concerning symptoms in the interval. The patient  voices understanding of current disease status Stefanie treatment options Stefanie Stefanie in agreement with the current care plan.  All questions were answered. The patient knows to call the clinic with any problems, questions or concerns. We can certainly see the patient much sooner if necessary.  Disclaimer: This note was dictated with voice recognition software. Similar sounding words can inadvertently be transcribed Stefanie may not be corrected upon review.

## 2020-12-06 DIAGNOSIS — M6281 Muscle weakness (generalized): Secondary | ICD-10-CM | POA: Diagnosis not present

## 2020-12-06 DIAGNOSIS — C7A090 Malignant carcinoid tumor of the bronchus and lung: Secondary | ICD-10-CM | POA: Diagnosis not present

## 2020-12-06 DIAGNOSIS — R296 Repeated falls: Secondary | ICD-10-CM | POA: Diagnosis not present

## 2020-12-07 DIAGNOSIS — H02054 Trichiasis without entropian left upper eyelid: Secondary | ICD-10-CM | POA: Diagnosis not present

## 2020-12-07 DIAGNOSIS — H524 Presbyopia: Secondary | ICD-10-CM | POA: Diagnosis not present

## 2020-12-07 DIAGNOSIS — H02051 Trichiasis without entropian right upper eyelid: Secondary | ICD-10-CM | POA: Diagnosis not present

## 2020-12-07 DIAGNOSIS — H02052 Trichiasis without entropian right lower eyelid: Secondary | ICD-10-CM | POA: Diagnosis not present

## 2020-12-07 DIAGNOSIS — H04123 Dry eye syndrome of bilateral lacrimal glands: Secondary | ICD-10-CM | POA: Diagnosis not present

## 2020-12-08 ENCOUNTER — Telehealth: Payer: Self-pay | Admitting: Internal Medicine

## 2020-12-08 DIAGNOSIS — M6281 Muscle weakness (generalized): Secondary | ICD-10-CM | POA: Diagnosis not present

## 2020-12-08 DIAGNOSIS — R296 Repeated falls: Secondary | ICD-10-CM | POA: Diagnosis not present

## 2020-12-08 DIAGNOSIS — C7A090 Malignant carcinoid tumor of the bronchus and lung: Secondary | ICD-10-CM | POA: Diagnosis not present

## 2020-12-08 NOTE — Telephone Encounter (Signed)
Scheduled per los. Called and spoke with patient. Confirmed appt 

## 2020-12-09 DIAGNOSIS — M6281 Muscle weakness (generalized): Secondary | ICD-10-CM | POA: Diagnosis not present

## 2020-12-09 DIAGNOSIS — C7A090 Malignant carcinoid tumor of the bronchus and lung: Secondary | ICD-10-CM | POA: Diagnosis not present

## 2020-12-09 DIAGNOSIS — R296 Repeated falls: Secondary | ICD-10-CM | POA: Diagnosis not present

## 2020-12-12 DIAGNOSIS — C7A090 Malignant carcinoid tumor of the bronchus and lung: Secondary | ICD-10-CM | POA: Diagnosis not present

## 2020-12-12 DIAGNOSIS — M6281 Muscle weakness (generalized): Secondary | ICD-10-CM | POA: Diagnosis not present

## 2020-12-12 DIAGNOSIS — R296 Repeated falls: Secondary | ICD-10-CM | POA: Diagnosis not present

## 2020-12-29 ENCOUNTER — Ambulatory Visit: Payer: Medicare Other | Admitting: Podiatry

## 2020-12-29 ENCOUNTER — Other Ambulatory Visit: Payer: Self-pay

## 2020-12-29 DIAGNOSIS — Z7901 Long term (current) use of anticoagulants: Secondary | ICD-10-CM

## 2020-12-29 DIAGNOSIS — L600 Ingrowing nail: Secondary | ICD-10-CM

## 2020-12-29 MED ORDER — CEPHALEXIN 500 MG PO CAPS: 500.0000 mg | ORAL_CAPSULE | Freq: Two times a day (BID) | ORAL | 0 refills | Status: DC

## 2021-01-02 ENCOUNTER — Inpatient Hospital Stay: Payer: Medicare Other

## 2021-01-02 ENCOUNTER — Other Ambulatory Visit: Payer: Self-pay

## 2021-01-02 ENCOUNTER — Ambulatory Visit (HOSPITAL_COMMUNITY)
Admission: RE | Admit: 2021-01-02 | Discharge: 2021-01-02 | Disposition: A | Discharge status: Home / Self Care | Payer: Medicare Other | Source: Ambulatory Visit | Attending: Internal Medicine | Admitting: Internal Medicine

## 2021-01-02 DIAGNOSIS — N1831 Chronic kidney disease, stage 3a: Secondary | ICD-10-CM | POA: Diagnosis not present

## 2021-01-02 DIAGNOSIS — C3490 Malignant neoplasm of unspecified part of unspecified bronchus or lung: Secondary | ICD-10-CM | POA: Diagnosis not present

## 2021-01-02 DIAGNOSIS — R911 Solitary pulmonary nodule: Secondary | ICD-10-CM | POA: Diagnosis not present

## 2021-01-02 DIAGNOSIS — Z7901 Long term (current) use of anticoagulants: Secondary | ICD-10-CM | POA: Diagnosis not present

## 2021-01-02 DIAGNOSIS — Z79899 Other long term (current) drug therapy: Secondary | ICD-10-CM | POA: Diagnosis not present

## 2021-01-02 DIAGNOSIS — C7931 Secondary malignant neoplasm of brain: Secondary | ICD-10-CM | POA: Diagnosis not present

## 2021-01-02 DIAGNOSIS — J90 Pleural effusion, not elsewhere classified: Secondary | ICD-10-CM | POA: Diagnosis not present

## 2021-01-02 DIAGNOSIS — Z86711 Personal history of pulmonary embolism: Secondary | ICD-10-CM | POA: Diagnosis not present

## 2021-01-02 DIAGNOSIS — J939 Pneumothorax, unspecified: Secondary | ICD-10-CM | POA: Diagnosis not present

## 2021-01-02 DIAGNOSIS — I129 Hypertensive chronic kidney disease with stage 1 through stage 4 chronic kidney disease, or unspecified chronic kidney disease: Secondary | ICD-10-CM | POA: Diagnosis not present

## 2021-01-02 DIAGNOSIS — C3432 Malignant neoplasm of lower lobe, left bronchus or lung: Secondary | ICD-10-CM | POA: Diagnosis not present

## 2021-01-02 DIAGNOSIS — Z8673 Personal history of transient ischemic attack (TIA), and cerebral infarction without residual deficits: Secondary | ICD-10-CM | POA: Diagnosis not present

## 2021-01-02 DIAGNOSIS — K7689 Other specified diseases of liver: Secondary | ICD-10-CM | POA: Diagnosis not present

## 2021-01-02 DIAGNOSIS — E039 Hypothyroidism, unspecified: Secondary | ICD-10-CM | POA: Diagnosis not present

## 2021-01-02 DIAGNOSIS — N281 Cyst of kidney, acquired: Secondary | ICD-10-CM | POA: Diagnosis not present

## 2021-01-02 DIAGNOSIS — G4733 Obstructive sleep apnea (adult) (pediatric): Secondary | ICD-10-CM | POA: Diagnosis not present

## 2021-01-02 DIAGNOSIS — C7951 Secondary malignant neoplasm of bone: Secondary | ICD-10-CM | POA: Diagnosis not present

## 2021-01-02 LAB — CMP (CANCER CENTER ONLY)
ALT: 10 U/L (ref 0–44)
AST: 14 U/L — ABNORMAL LOW (ref 15–41)
Albumin: 3.7 g/dL (ref 3.5–5.0)
Alkaline Phosphatase: 149 U/L — ABNORMAL HIGH (ref 38–126)
Anion gap: 11 (ref 5–15)
BUN: 14 mg/dL (ref 8–23)
CO2: 27 mmol/L (ref 22–32)
Calcium: 9.6 mg/dL (ref 8.9–10.3)
Chloride: 103 mmol/L (ref 98–111)
Creatinine: 1.18 mg/dL — ABNORMAL HIGH (ref 0.44–1.00)
GFR, Estimated: 49 mL/min — ABNORMAL LOW (ref >60)
Glucose, Bld: 84 mg/dL (ref 70–99)
Potassium: 3.8 mmol/L (ref 3.5–5.1)
Sodium: 141 mmol/L (ref 135–145)
Total Bilirubin: 0.7 mg/dL (ref 0.3–1.2)
Total Protein: 6.8 g/dL (ref 6.5–8.1)

## 2021-01-02 LAB — CBC WITH DIFFERENTIAL (CANCER CENTER ONLY)
Abs Immature Granulocytes: 0.01 10*3/uL (ref 0.00–0.07)
Basophils Absolute: 0 10*3/uL (ref 0.0–0.1)
Basophils Relative: 1 %
Eosinophils Absolute: 0 10*3/uL (ref 0.0–0.5)
Eosinophils Relative: 1 %
HCT: 37.5 % (ref 36.0–46.0)
Hemoglobin: 12.6 g/dL (ref 12.0–15.0)
Immature Granulocytes: 0 %
Lymphocytes Relative: 18 %
Lymphs Abs: 0.8 10*3/uL (ref 0.7–4.0)
MCH: 31.2 pg (ref 26.0–34.0)
MCHC: 33.6 g/dL (ref 30.0–36.0)
MCV: 92.8 fL (ref 80.0–100.0)
Monocytes Absolute: 0.4 10*3/uL (ref 0.1–1.0)
Monocytes Relative: 9 %
Neutro Abs: 3.1 10*3/uL (ref 1.7–7.7)
Neutrophils Relative %: 71 %
Platelet Count: 123 10*3/uL — ABNORMAL LOW (ref 150–400)
RBC: 4.04 MIL/uL (ref 3.87–5.11)
RDW: 14 % (ref 11.5–15.5)
WBC Count: 4.3 10*3/uL (ref 4.0–10.5)
nRBC: 0 % (ref 0.0–0.2)

## 2021-01-02 MED ORDER — IOHEXOL 350 MG/ML SOLN
80.0000 mL | Freq: Once | INTRAVENOUS | Status: AC | PRN
  Administered 2021-01-02: 80 mL via INTRAVENOUS

## 2021-01-02 MED ORDER — IOHEXOL 9 MG/ML PO SOLN
ORAL | Status: AC
  Filled 2021-01-02: qty 1000

## 2021-01-02 MED ORDER — IOHEXOL 9 MG/ML PO SOLN
1000.0000 mL | ORAL | Status: AC
  Administered 2021-01-02: 1000 mL via ORAL

## 2021-01-04 ENCOUNTER — Other Ambulatory Visit: Payer: Self-pay

## 2021-01-04 ENCOUNTER — Inpatient Hospital Stay (HOSPITAL_BASED_OUTPATIENT_CLINIC_OR_DEPARTMENT_OTHER): Payer: Medicare Other | Admitting: Internal Medicine

## 2021-01-04 VITALS — BP 133/77 | HR 73 | Temp 97.7°F | Resp 18 | Ht 65.0 in | Wt 140.7 lb

## 2021-01-04 DIAGNOSIS — E039 Hypothyroidism, unspecified: Secondary | ICD-10-CM | POA: Diagnosis not present

## 2021-01-04 DIAGNOSIS — Z86711 Personal history of pulmonary embolism: Secondary | ICD-10-CM | POA: Diagnosis not present

## 2021-01-04 DIAGNOSIS — C3492 Malignant neoplasm of unspecified part of left bronchus or lung: Secondary | ICD-10-CM

## 2021-01-04 DIAGNOSIS — C7931 Secondary malignant neoplasm of brain: Secondary | ICD-10-CM | POA: Diagnosis not present

## 2021-01-04 DIAGNOSIS — C3432 Malignant neoplasm of lower lobe, left bronchus or lung: Secondary | ICD-10-CM | POA: Diagnosis not present

## 2021-01-04 DIAGNOSIS — Z8673 Personal history of transient ischemic attack (TIA), and cerebral infarction without residual deficits: Secondary | ICD-10-CM | POA: Diagnosis not present

## 2021-01-04 DIAGNOSIS — I129 Hypertensive chronic kidney disease with stage 1 through stage 4 chronic kidney disease, or unspecified chronic kidney disease: Secondary | ICD-10-CM | POA: Diagnosis not present

## 2021-01-04 DIAGNOSIS — N1831 Chronic kidney disease, stage 3a: Secondary | ICD-10-CM | POA: Diagnosis not present

## 2021-01-04 DIAGNOSIS — Z79899 Other long term (current) drug therapy: Secondary | ICD-10-CM | POA: Diagnosis not present

## 2021-01-04 DIAGNOSIS — C7951 Secondary malignant neoplasm of bone: Secondary | ICD-10-CM | POA: Diagnosis not present

## 2021-01-04 DIAGNOSIS — G4733 Obstructive sleep apnea (adult) (pediatric): Secondary | ICD-10-CM | POA: Diagnosis not present

## 2021-01-04 DIAGNOSIS — Z7901 Long term (current) use of anticoagulants: Secondary | ICD-10-CM | POA: Diagnosis not present

## 2021-01-04 NOTE — Progress Notes (Signed)
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 2) pulmonary embolus in the right main pulmonary artery diagnosed in November 2021.   Biomarker Findings Microsatellite status - MS-Stable Tumor Mutational Burden - 1 Muts/Mb Genomic Findings For a complete list of the genes assayed, please refer to the Appendix. EGFR exon 19 deletion ([id number]) 7 Disease relevant genes with no reportable alterations: ALK, BRAF, ERBB2, KRAS, MET, RET, ROS1  PDL1 expression: 2%.   PRIOR THERAPY: Whole brain radiation and radiotherapy to the osseous metastases under the care of Dr. Lisbeth Camacho. Last treatment expected on 02/02/20   CURRENT THERAPY:  1) Tagrisso 160 mg p.o. daily. First dose on 02/01/20.  Status post 11 months of treatment. 2) Eliquis 5 mg p.o. twice daily  INTERVAL HISTORY: Stefanie Camacho 75 y.o. female returns to the clinic today for follow-up visit accompanied by her sister.  The patient is feeling fine today with no concerning complaints except for the intermittent episodes of diarrhea that have been every few days.  She denied having any current skin rash.  She has no chest pain, shortness of breath, cough or hemoptysis.  She denied having any fever or chills.  She has no nausea, vomiting, abdominal pain or constipation.  She continues to tolerate her treatment with Tagrisso fairly well except for the diarrhea.  She had repeat CT scan of the chest, abdomen pelvis performed recently and she is here for evaluation and discussion of her risk her  results.  MEDICAL HISTORY: Past Medical History:  Diagnosis Date   Acute CVA (cerebrovascular accident) ([place]) 01/09/2020   Adenocarcinoma of left lung, stage 4 ([place]) 01/18/2020   Anxiety    Blood clots in brain    Chronic kidney disease due to hypertension 05/16/2020   Chronic kidney disease, stage 3a ([place]) 05/16/2020   Chronic respiratory failure ([place]) 05/16/2020   Cough 03/18/2020   Daytime somnolence 05/16/2020   Dizziness 09/25/2019   Dyslipidemia    Encounter for antineoplastic chemotherapy 01/18/2020   Essential hypertension    Gastro-esophageal reflux disease without esophagitis    Generalized weakness 03/18/2020   GERD (gastroesophageal reflux disease)    Goals of care, counseling/discussion 01/18/2020   Hardening of the aorta (main artery of the heart) ([place]) 09/08/2020   Hyperlipidemia 05/16/2020   Hypothyroidism    Hypothyroidism, unspecified    Insomnia 05/16/2020   Intracranial bleeding ([place]) 01/09/2020   Left bundle branch block 06/04/2018   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   Long term (current) use of anticoagulants 05/16/2020   Lumbar foraminal stenosis 12/30/2019   Lung mass 05/16/2020   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[place]) 05/16/2020   Major depression, single episode 05/16/2020   Malignant neoplasm of lower lobe, left bronchus or lung ([place]) 01/14/2020   NICM (nonischemic cardiomyopathy) ([place])    Nonischemic cardiomyopathy ([place]) 06/04/2018   Ejection fraction 45% in summer 2019   Nonobstructive cardiomyopathy ([place])    Obstructive sleep apnea syndrome    Recurrent falls 07/12/2020   Rosacea 05/16/2020   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  MEDICATIONS:  Current Outpatient Medications  Medication Sig Dispense Refill   apixaban (ELIQUIS) 5 MG TABS tablet Take 1 tablet (5 mg total) by mouth 2 (two) times daily. 90 tablet 1   atorvastatin (LIPITOR) 10 MG tablet Take 1 tablet (10 mg total) by mouth daily at 6 PM. 90 tablet 3   cephALEXin (KEFLEX) 500 MG capsule Take 1 capsule (500 mg total) by mouth 2 (two) times daily. 14 capsule 0   Ensure Plus (ENSURE PLUS) LIQD Take 237 mLs by mouth as needed (For Nutritional Supplemntation. Unknow strength or amount).     FLUoxetine (PROZAC) 20 MG capsule Take 20 mg by mouth daily.     levETIRAcetam (KEPPRA) 250 MG tablet Take 250 mg by mouth 2 (two) times daily.     levothyroxine (SYNTHROID) 75 MCG tablet Take 1 tablet (75 mcg total) by mouth daily. 30 tablet 1   loperamide (IMODIUM) 2 MG capsule Take 2 mg by mouth as needed for diarrhea or loose stools.     losartan (COZAAR) 50 MG tablet Take 1 tablet (50 mg total) by mouth daily. 90 tablet 1   metoprolol succinate (TOPROL-XL) 25 MG 24 hr tablet Take by mouth.     osimertinib mesylate (TAGRISSO) 80 MG tablet Take 2 tablets (160 mg total) by mouth daily. 60 tablet 3   pantoprazole (PROTONIX) 40 MG tablet Take 1 tablet by mouth once daily (Patient taking differently: Take 40 mg by mouth every evening.) 30 tablet 0   Polyethyl Glycol-Propyl Glycol (SYSTANE OP) Apply 1 drop to eye as needed (Eye dryness).     prochlorperazine (COMPAZINE) 10 MG tablet Take 1 tablet (10 mg total) by mouth every 6 (six) hours as needed for nausea or vomiting. 45 tablet 5   spironolactone (ALDACTONE) 25 MG tablet Take 25 mg by mouth daily.     triamcinolone cream (KENALOG) 0.1 % Apply topically daily.     No current facility-administered medications for this visit.    SURGICAL HISTORY:  Past Surgical History:  Procedure Laterality Date   ABDOMINAL HYSTERECTOMY      BLADDER NECK RECONSTRUCTION     BRONCHIAL BIOPSY  01/12/2020   Procedure: BRONCHIAL BIOPSIES;  Surgeon: Garner Nash, DO;  Location: [facility];  Service: Pulmonary;;   BRONCHIAL BRUSHINGS  01/12/2020   Procedure: BRONCHIAL BRUSHINGS;  Surgeon: Garner Nash, DO;  Location: [facility];  Service: Pulmonary;;   BRONCHIAL NEEDLE ASPIRATION BIOPSY  01/12/2020   Procedure: BRONCHIAL NEEDLE ASPIRATION BIOPSIES;  Surgeon: Garner Nash, DO;  Location: [facility];  Service: Pulmonary;;   BRONCHIAL WASHINGS  01/12/2020   Procedure: BRONCHIAL WASHINGS;  Surgeon: Garner Nash, DO;  Location: [facility];  Service: Pulmonary;;   SHOULDER SURGERY     TONSILLECTOMY     VIDEO BRONCHOSCOPY WITH ENDOBRONCHIAL ULTRASOUND  01/12/2020   Procedure: VIDEO BRONCHOSCOPY WITH ENDOBRONCHIAL ULTRASOUND;  Surgeon: Garner Nash, DO;  Location: [facility];  Service: Pulmonary;;    REVIEW OF SYSTEMS:  Constitutional: positive for fatigue Eyes: negative Ears, nose, mouth, throat, and face: negative Respiratory: negative Cardiovascular: negative Gastrointestinal: positive for diarrhea Genitourinary:negative Integument/breast: negative Hematologic/lymphatic: negative  Musculoskeletal:negative Neurological: positive for weakness Behavioral/Psych: negative Endocrine: negative Allergic/Immunologic: negative   PHYSICAL EXAMINATION: General appearance: alert, cooperative, fatigued, and no distress Head: Normocephalic, without obvious abnormality, atraumatic Neck: no adenopathy, no JVD, supple, symmetrical, trachea midline, and thyroid not enlarged, symmetric, no tenderness/mass/nodules Lymph nodes: Cervical, supraclavicular, and axillary nodes normal. Resp: clear to auscultation bilaterally Back: symmetric, no curvature. ROM normal. No CVA tenderness. Cardio: regular rate and rhythm, S1, S2 normal, no murmur, click, rub or gallop GI: soft, non-tender; bowel sounds normal; no masses,  no  organomegaly Extremities: extremities normal, atraumatic, no cyanosis or edema Neurologic: Alert and oriented X 3, normal strength and tone. Normal symmetric reflexes. Normal coordination and gait  ECOG PERFORMANCE STATUS: 1 - Symptomatic but completely ambulatory  Blood pressure 133/77, pulse 73, temperature 97.7 F (36.5 C), temperature source Tympanic, resp. rate 18, height 5' 5" (1.651 m), weight 140 lb 11.2 oz (63.8 kg), SpO2 97 %.  LABORATORY DATA: Lab Results  Component Value Date   WBC 4.3 01/02/2021   HGB 12.6 01/02/2021   HCT 37.5 01/02/2021   MCV 92.8 01/02/2021   PLT 123 (L) 01/02/2021      Chemistry      Component Value Date/Time   NA 141 01/02/2021 0955   K 3.8 01/02/2021 0955   CL 103 01/02/2021 0955   CO2 27 01/02/2021 0955   BUN 14 01/02/2021 0955   CREATININE 1.18 (H) 01/02/2021 0955      Component Value Date/Time   CALCIUM 9.6 01/02/2021 0955   ALKPHOS 149 (H) 01/02/2021 0955   AST 14 (L) 01/02/2021 0955   ALT 10 01/02/2021 0955   BILITOT 0.7 01/02/2021 0955       RADIOGRAPHIC STUDIES: CT Chest W Contrast  Result Date: 01/03/2021 CLINICAL DATA:  Non-small cell lung cancer, oral chemotherapy ongoing EXAM: CT CHEST, ABDOMEN, AND PELVIS WITH CONTRAST TECHNIQUE: Multidetector CT imaging of the chest, abdomen and pelvis was performed following the standard protocol during bolus administration of intravenous contrast. CONTRAST:  34m OMNIPAQUE IOHEXOL 350 MG/ML SOLN COMPARISON:  09/26/2020 FINDINGS: CT CHEST FINDINGS Cardiovascular: Heart is normal in size.  No pericardial effusion. No evidence of thoracic aortic aneurysm. Mediastinum/Nodes: No suspicious mediastinal lymphadenopathy. Visualized thyroid is unremarkable. Lungs/Pleura: Moderate left pleural effusion, unchanged. However, there has been interval development of a tiny left apical pneumothorax component (series 7/image 16), new from prior. Suspected radiation changes in the left upper and lower lobes  (series 7/image 75). Additional scattered subcentimeter predominantly ground-glass nodules in the lungs bilaterally, left upper lobe predominant, grossly unchanged and indeterminate. However, solid nodules are also present, favoring progressive metastatic disease. For example: --5 mm nodule in the anterior right lower lobe (series 7/image 86), previously 2-3 mm --7 mm solid nodule in the central right upper lobe (series 7/image 87) previously 4 mm --7 mm solid nodule inferiorly in the right middle lobe (series 7/image 108), unchanged --5 mm solid nodule in the medial right lower lobe (series 7/image 109), previously 2-3 mm Stable scarring/atelectasis in the lateral right lower lobe (series 7/image 96). Musculoskeletal: Multifocal sclerotic metastases in the visualized axial and appendicular skeleton, mildly progressive. For example, an 8 mm lesion in the medial right clavicle (series 3/image 11) previously measured 4 mm. A 10 mm lesion in the right T10 vertebral body (series 3/image 37) previously measured 7 mm. Mild degenerative changes of the thoracic spine. CT ABDOMEN PELVIS FINDINGS Hepatobiliary: 15 mm cyst in the central hepatic dome (series 3/image 45). 21 mm probable hemangioma in the  posterior right hepatic lobe (series 3/image 52). Additional hypervascular lesions in the right liver (series 3/images 46 and 59) are similar to the prior and may reflect flash filling hemangiomas. Gallbladder is unremarkable. No intrahepatic or extrahepatic ductal dilatation. Pancreas: Within normal limits. Spleen: Within normal limits. Adrenals/Urinary Tract: Adrenal glands are within normal limits. Right renal cysts, measuring up to 2.2 cm in the right lower pole. No hydronephrosis. Bladder is within normal limits. Stomach/Bowel: Stomach is within normal limits. No evidence of bowel obstruction. Cecum and ileocecal valve are located in the left mid abdomen. Normal appendix (series 3/image 67). No colonic wall thickening or  mass is evident on CT. Vascular/Lymphatic: No evidence of abdominal aortic aneurysm. No suspicious abdominopelvic lymphadenopathy. Reproductive: Status post hysterectomy. Bilateral ovaries are within normal limits. Other: No abdominopelvic ascites. Musculoskeletal: Multifocal sclerotic metastases in the visualized axial and appendicular skeleton, mildly progressive. For example, an 8 mm lesion in the left L3 vertebral body (series 3/image 66) previously measured 6 mm. A 13 mm lesion in the right iliac bone (series 3/image 81) previously measured 11 mm. Mild superior endplate compression fracture deformity at L1 (sagittal image 87), unchanged. No retropulsion. Mild degenerative changes of the lumbar spine. IMPRESSION: Moderate left pleural effusion, unchanged, with interval development of a tiny left apical pneumothorax component. Radiation changes in the left hemithorax. Progressive solid right lung nodules, suspicious for metastases, with index lesions as above. Additional scattered ground-glass nodules in the lungs bilaterally, left upper lobe predominant, grossly unchanged and indeterminate. Stable hepatic lesions, favoring benign hemangiomas and cysts, although continued attention on follow-up is suggested. Progressive osseous metastases in the visualized axial and appendicular skeleton, with index lesions as above. These results will be called to the ordering clinician or representative by the Radiologist Assistant, and communication documented in the PACS or [employer]. Electronically Signed   By: Julian Hy M.D.   On: 01/03/2021 01:36   CT Abdomen Pelvis W Contrast  Result Date: 01/03/2021 CLINICAL DATA:  Non-small cell lung cancer, oral chemotherapy ongoing EXAM: CT CHEST, ABDOMEN, AND PELVIS WITH CONTRAST TECHNIQUE: Multidetector CT imaging of the chest, abdomen and pelvis was performed following the standard protocol during bolus administration of intravenous contrast. CONTRAST:  58m  OMNIPAQUE IOHEXOL 350 MG/ML SOLN COMPARISON:  09/26/2020 FINDINGS: CT CHEST FINDINGS Cardiovascular: Heart is normal in size.  No pericardial effusion. No evidence of thoracic aortic aneurysm. Mediastinum/Nodes: No suspicious mediastinal lymphadenopathy. Visualized thyroid is unremarkable. Lungs/Pleura: Moderate left pleural effusion, unchanged. However, there has been interval development of a tiny left apical pneumothorax component (series 7/image 16), new from prior. Suspected radiation changes in the left upper and lower lobes (series 7/image 75). Additional scattered subcentimeter predominantly ground-glass nodules in the lungs bilaterally, left upper lobe predominant, grossly unchanged and indeterminate. However, solid nodules are also present, favoring progressive metastatic disease. For example: --5 mm nodule in the anterior right lower lobe (series 7/image 86), previously 2-3 mm --7 mm solid nodule in the central right upper lobe (series 7/image 87) previously 4 mm --7 mm solid nodule inferiorly in the right middle lobe (series 7/image 108), unchanged --5 mm solid nodule in the medial right lower lobe (series 7/image 109), previously 2-3 mm Stable scarring/atelectasis in the lateral right lower lobe (series 7/image 96). Musculoskeletal: Multifocal sclerotic metastases in the visualized axial and appendicular skeleton, mildly progressive. For example, an 8 mm lesion in the medial right clavicle (series 3/image 11) previously measured 4 mm. A 10 mm lesion in the right T10 vertebral body (series  3/image 37) previously measured 7 mm. Mild degenerative changes of the thoracic spine. CT ABDOMEN PELVIS FINDINGS Hepatobiliary: 15 mm cyst in the central hepatic dome (series 3/image 45). 21 mm probable hemangioma in the posterior right hepatic lobe (series 3/image 52). Additional hypervascular lesions in the right liver (series 3/images 46 and 59) are similar to the prior and may reflect flash filling hemangiomas.  Gallbladder is unremarkable. No intrahepatic or extrahepatic ductal dilatation. Pancreas: Within normal limits. Spleen: Within normal limits. Adrenals/Urinary Tract: Adrenal glands are within normal limits. Right renal cysts, measuring up to 2.2 cm in the right lower pole. No hydronephrosis. Bladder is within normal limits. Stomach/Bowel: Stomach is within normal limits. No evidence of bowel obstruction. Cecum and ileocecal valve are located in the left mid abdomen. Normal appendix (series 3/image 67). No colonic wall thickening or mass is evident on CT. Vascular/Lymphatic: No evidence of abdominal aortic aneurysm. No suspicious abdominopelvic lymphadenopathy. Reproductive: Status post hysterectomy. Bilateral ovaries are within normal limits. Other: No abdominopelvic ascites. Musculoskeletal: Multifocal sclerotic metastases in the visualized axial and appendicular skeleton, mildly progressive. For example, an 8 mm lesion in the left L3 vertebral body (series 3/image 66) previously measured 6 mm. A 13 mm lesion in the right iliac bone (series 3/image 81) previously measured 11 mm. Mild superior endplate compression fracture deformity at L1 (sagittal image 87), unchanged. No retropulsion. Mild degenerative changes of the lumbar spine. IMPRESSION: Moderate left pleural effusion, unchanged, with interval development of a tiny left apical pneumothorax component. Radiation changes in the left hemithorax. Progressive solid right lung nodules, suspicious for metastases, with index lesions as above. Additional scattered ground-glass nodules in the lungs bilaterally, left upper lobe predominant, grossly unchanged and indeterminate. Stable hepatic lesions, favoring benign hemangiomas and cysts, although continued attention on follow-up is suggested. Progressive osseous metastases in the visualized axial and appendicular skeleton, with index lesions as above. These results will be called to the ordering clinician or  representative by the Radiologist Assistant, and communication documented in the PACS or [employer]. Electronically Signed   By: Julian Hy M.D.   On: 01/03/2021 01:36     ASSESSMENT AND PLAN: This is a very pleasant 75 years old white female recently diagnosed with a stage IV non-small cell lung cancer, adenocarcinoma in September 2021 and presented with left lower lobe lung mass in addition to extensive bilateral pulmonary metastasis and anterior mediastinal lymphadenopathy as well as a skeletal metastasis and metastatic disease to the brain with leptomeningeal disease.  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 She underwent whole brain irradiation and currently undergoing treatment with targeted therapy with Tagrisso 160 mg p.o. daily because of the leptomeningeal disease status post 11 months of treatment.  The patient has been tolerating this treatment well except for few episodes of diarrhea that have been every few days.  She does not take Imodium at regular basis. I recommended for her to take 1 or 2 Imodium tablet every morning to help with the diarrhea. She had repeat CT scan of the chest, abdomen and pelvis performed recently.  I personally and independently reviewed the scan images and discussed the results with the patient and her sister. Her scan showed mild disease progression with increase in multiple pulmonary nodules as well as few mild progressive osseous metastasis. I had a lengthy discussion with the patient and her sister about her current condition and treatment options.  I gave the patient the  option of continuous treatment with the same regimen for 2 more months before reevaluation and repeat imaging studies versus consideration of systemic chemotherapy but she is not interested in this option at least at this point.  The third option was palliative care and hospice but again the patient is not interested  in the third option either.  I will see her back for follow-up visit in 1 months for evaluation and repeat blood work but I will consider repeating her imaging studies in around 2 months from now. For the history of pulmonary embolism she will continue her current treatment with Eliquis. For the depression she will continue her current treatment with Prozac. For the history of leptomeningeal disease and brain metastasis, she will continue her routine follow-up visit and evaluation by Dr. Mickeal Skinner. The patient was advised to call immediately if she has any concerning symptoms in the interval.  The patient voices understanding of current disease status and treatment options and is in agreement with the current care plan.  All questions were answered. The patient knows to call the clinic with any problems, questions or concerns. We can certainly see the patient much sooner if necessary.  Disclaimer: This note was dictated with voice recognition software. Similar sounding words can inadvertently be transcribed and may not be corrected upon review.

## 2021-01-04 NOTE — Progress Notes (Signed)
Subjective: 75 year old female presents the office today for concerns of possible ingrown toenail left big toe.  She states this been somewhat red and swollen.  No drainage. Denies any systemic complaints such as fevers, chills, nausea, vomiting. No acute changes since last appointment, and no other complaints at this time.   Objective: AAO x3, NAD DP/PT pulses palpable bilaterally, CRT less than 3 seconds Incurvation noted along the left hallux toenail there is localized edema and erythema on the nail.  There is no drainage or pus.  Also incurvation of the right second medial nail border.  No drainage or pus.  There is mild tenderness palpation to the toenail. no pain with calf compression, swelling, warmth, erythema  Assessment: Ingrown toenail  Plan: -All treatment options discussed with the patient including all alternatives, risks, complications.  -Debrided the course of any complications or bleeding.  Prescribed Keflex.  She can apply a small amount of antibiotic ointment as well to the nail borders.  I will see her back in 2 weeks.  Monitor closely any signs or symptoms of worsening infection or pain. -Patient encouraged to call the office with any questions, concerns, change in symptoms.   Trula Slade DPM

## 2021-01-06 ENCOUNTER — Telehealth: Payer: Self-pay | Admitting: Medical Oncology

## 2021-01-06 ENCOUNTER — Other Ambulatory Visit: Payer: Self-pay | Admitting: Medical Oncology

## 2021-01-06 DIAGNOSIS — C3492 Malignant neoplasm of unspecified part of left bronchus or lung: Secondary | ICD-10-CM

## 2021-01-06 MED ORDER — OSIMERTINIB MESYLATE 80 MG PO TABS: 160.0000 mg | ORAL_TABLET | Freq: Every day | ORAL | 3 refills | Status: DC

## 2021-01-06 NOTE — Telephone Encounter (Signed)
Called in refill for [place] . Mailing address confirmed for [facility].

## 2021-01-06 NOTE — Telephone Encounter (Signed)
Requests refill for tagrisso be sent to [facility]. Pt has 3 tablets left.  [facility] mails to pt who brings them to [facility] to [facility] for [facility]. Rx sent to [facility] for auth.

## 2021-01-12 ENCOUNTER — Other Ambulatory Visit: Payer: Self-pay

## 2021-01-12 ENCOUNTER — Ambulatory Visit: Payer: Medicare Other | Admitting: Podiatry

## 2021-01-12 DIAGNOSIS — L600 Ingrowing nail: Secondary | ICD-10-CM | POA: Diagnosis not present

## 2021-01-12 NOTE — Patient Instructions (Signed)

## 2021-01-13 ENCOUNTER — Emergency Department (HOSPITAL_COMMUNITY): Payer: Medicare Other

## 2021-01-13 ENCOUNTER — Encounter: Payer: Self-pay | Admitting: Internal Medicine

## 2021-01-13 ENCOUNTER — Other Ambulatory Visit: Payer: Self-pay

## 2021-01-13 ENCOUNTER — Emergency Department (HOSPITAL_COMMUNITY)
Admission: EM | Admit: 2021-01-13 | Discharge: 2021-01-13 | Disposition: A | Discharge status: Home / Self Care | Payer: Medicare Other | Attending: Emergency Medicine | Admitting: Emergency Medicine

## 2021-01-13 DIAGNOSIS — R42 Dizziness and giddiness: Secondary | ICD-10-CM | POA: Diagnosis not present

## 2021-01-13 DIAGNOSIS — M50323 Other cervical disc degeneration at C6-C7 level: Secondary | ICD-10-CM | POA: Diagnosis not present

## 2021-01-13 DIAGNOSIS — S0990XA Unspecified injury of head, initial encounter: Secondary | ICD-10-CM | POA: Insufficient documentation

## 2021-01-13 DIAGNOSIS — W19XXXA Unspecified fall, initial encounter: Secondary | ICD-10-CM | POA: Diagnosis not present

## 2021-01-13 DIAGNOSIS — Y92009 Unspecified place in unspecified non-institutional (private) residence as the place of occurrence of the external cause: Secondary | ICD-10-CM | POA: Diagnosis not present

## 2021-01-13 DIAGNOSIS — W1812XA Fall from or off toilet with subsequent striking against object, initial encounter: Secondary | ICD-10-CM | POA: Diagnosis not present

## 2021-01-13 DIAGNOSIS — M50322 Other cervical disc degeneration at C5-C6 level: Secondary | ICD-10-CM | POA: Diagnosis not present

## 2021-01-13 DIAGNOSIS — J90 Pleural effusion, not elsewhere classified: Secondary | ICD-10-CM | POA: Diagnosis not present

## 2021-01-13 DIAGNOSIS — Z7901 Long term (current) use of anticoagulants: Secondary | ICD-10-CM

## 2021-01-13 DIAGNOSIS — R0902 Hypoxemia: Secondary | ICD-10-CM | POA: Diagnosis not present

## 2021-01-13 DIAGNOSIS — S199XXA Unspecified injury of neck, initial encounter: Secondary | ICD-10-CM | POA: Diagnosis not present

## 2021-01-13 DIAGNOSIS — Z743 Need for continuous supervision: Secondary | ICD-10-CM | POA: Diagnosis not present

## 2021-01-13 DIAGNOSIS — M549 Dorsalgia, unspecified: Secondary | ICD-10-CM | POA: Diagnosis not present

## 2021-01-13 NOTE — ED Notes (Signed)
Pt ambulated to restroom with assistance of walker. Stated she feels fine, no dizziness, no feeling of faint

## 2021-01-13 NOTE — ED Provider Notes (Signed)
[facility] EMERGENCY DEPARTMENT Provider Note   CSN: [id number] Arrival date & time: 01/13/21  0113     History No chief complaint on file.   Stefanie Camacho is a 75 y.o. female.  The history is provided by the patient.  She has history of hypertension, hyperlipidemia, ischemic cardiomyopathy, lung cancer, pulmonary embolism anticoagulated on apixaban and comes in following a fall at home.  She fell as she was trying to get onto the commode, and did strike her head.  She denies loss of consciousness.  She denies any visual change, nausea, vomiting.  She was somewhat unsteady when she tried to stand up.  She denies other injury.   No past medical history on file.  There are no problems to display for this patient.   ** The histories are not reviewed yet. Please review them in the "History" navigator section and refresh this [place].   OB History   No obstetric history on file.     No family history on file.     Home Medications Prior to Admission medications   Not on File    Allergies    Patient has no allergy information on record.  Review of Systems   Review of Systems  All other systems reviewed and are negative.  Physical Exam Updated Vital Signs BP 139/67   Pulse (!) 58   Temp 98.3 F (36.8 C) (Oral)   Resp 20   Ht 5\' 5"  (1.651 m)   Wt 59 kg   SpO2 99%   BMI 21.63 kg/m   Physical Exam Vitals and nursing note reviewed.  75 year old female, resting comfortably and in no acute distress. Vital signs are normal. Oxygen saturation is 99%, which is normal. Head is normocephalic and atraumatic. PERRLA, EOMI. Oropharynx is clear. Neck is nontender without adenopathy or JVD. Back is nontender and there is no CVA tenderness. Lungs are clear without rales, wheezes, or rhonchi. Chest is nontender. Heart has regular rate and rhythm without murmur. Abdomen is soft, flat, nontender without masses or hepatosplenomegaly and peristalsis is  normoactive. Extremities have no cyanosis or edema, full range of motion is present. Skin is warm and dry without rash. Neurologic: Mental status is normal, cranial nerves are intact, there are no motor or sensory deficits.  ED Results / Procedures / Treatments    EKG EKG Interpretation  Date/Time:  Friday January 13 2021 01:24:49 EDT Ventricular Rate:  55 PR Interval:  162 QRS Duration: 107 QT Interval:  520 QTC Calculation: 498 R Axis:   11 Text Interpretation: Sinus rhythm Anterior infarct, age indeterminate No old tracing to compare Confirmed by Delora Fuel ([id number]) on 01/13/2021 1:53:25 AM  Radiology CT HEAD WO CONTRAST (5MM)  Result Date: 01/13/2021 CLINICAL DATA:  Head trauma, minor (Age >= 65y) EXAM: CT HEAD WITHOUT CONTRAST TECHNIQUE: Contiguous axial images were obtained from the base of the skull through the vertex without intravenous contrast. COMPARISON:  01/08/2020 FINDINGS: Brain: No acute intracranial abnormality. Specifically, no hemorrhage, hydrocephalus, mass lesion, acute infarction, or significant intracranial injury. Vascular: No hyperdense vessel or unexpected calcification. Skull: No acute calvarial abnormality. Sinuses/Orbits: No acute findings Other: None IMPRESSION: No acute intracranial abnormality. Electronically Signed   By: Rolm Baptise M.D.   On: 01/13/2021 02:07   CT Cervical Spine Wo Contrast  Result Date: 01/13/2021 CLINICAL DATA:  Neck trauma (Age >= 65y) EXAM: CT CERVICAL SPINE WITHOUT CONTRAST TECHNIQUE: Multidetector CT imaging of the cervical spine was performed without intravenous contrast.  Multiplanar CT image reconstructions were also generated. COMPARISON:  None. FINDINGS: Alignment: Normal Skull base and vertebrae: No acute fracture. No primary bone lesion or focal pathologic process. Soft tissues and spinal canal: No prevertebral fluid or swelling. No visible canal hematoma. Disc levels: Degenerative disc disease, most pronounced at C5-6 and C6-7.  Diffuse bilateral degenerative facet disease. Upper chest: Left pleural effusion partially imaged. Scarring in the apices. Other: None IMPRESSION: Degenerative disc and facet disease.  No acute bony abnormality. Left pleural effusion partially imaged in the left upper chest. Electronically Signed   By: Rolm Baptise M.D.   On: 01/13/2021 01:59    Procedures Procedures  CRITICAL CARE Performed by: Delora Fuel Total critical care time: 35 minutes Critical care time was exclusive of separately billable procedures and treating other patients. Critical care was necessary to treat or prevent imminent or life-threatening deterioration. Critical care was time spent personally by me on the following activities: development of treatment plan with patient and/or surrogate as well as nursing, discussions with consultants, evaluation of patient's response to treatment, examination of patient, obtaining history from patient or surrogate, ordering and performing treatments and interventions, ordering and review of laboratory studies, ordering and review of radiographic studies, pulse oximetry and re-evaluation of patient's condition.  Medications Ordered in ED Medications - No data to display  ED Course  I have reviewed the triage vital signs and the nursing notes.  Pertinent imaging results that were available during my care of the patient were reviewed by me and considered in my medical decision making (see chart for details).   MDM Rules/Calculators/A&P                         Fall with head injury and patient on anticoagulants.  She arrived as a level 2 trauma.  She will be sent for CT of head and cervical spine.  Old records are reviewed confirming history of pulmonary embolism with anticoagulation on apixaban.  ECG shows no acute changes.  CT of head and cervical spine also show no acute changes.  No evidence of intracranial injury, no evidence of C-spine fracture.  She is ambulated in the ED and noted  to be stable.  She does use a walker at home.  She is discharged with instructions to follow-up with PCP and with her oncologist.  Final Clinical Impression(s) / ED Diagnoses Final diagnoses:  Fall in home, initial encounter  Chronic anticoagulation    Rx / DC Orders ED Discharge Orders     None        Delora Fuel, MD 16/10/96 [phone]

## 2021-01-13 NOTE — ED Notes (Signed)
Provider at bedside

## 2021-01-13 NOTE — ED Notes (Signed)
At bedside with pt. Made aware that they are up for d/c and we needed to get in touch with someone to come pick her up. Per pt call son Lanissa Cashen [phone]. Attempted to reach son at this time. Spoke with son and was advised he would be here in about 30-19min

## 2021-01-13 NOTE — ED Triage Notes (Signed)
Pt presents via EMS.  States she got up to use the bathroom and was about to sit on the toilet and fell back into the shower hitting her head. States she has hx of vertigo but doesn't like taking the meclizine.  Pt does take Eliquis but doesn't recall why.

## 2021-01-13 NOTE — ED Notes (Signed)
Charge nurse made aware of waiting for son to pick up

## 2021-01-13 NOTE — ED Notes (Signed)
Pt returned to room via ambulation with walker and NT assistance at this time

## 2021-01-13 NOTE — Progress Notes (Signed)
Orthopedic Tech Progress Note Patient Details:  Stefanie Camacho Jun 29, 1945 [id number] Level 2 trauma Patient ID: Stefanie Camacho, female   DOB: [birth year], 75 y.o.   MRN: [id number]  Stefanie Camacho 01/13/2021, 1:29 AM

## 2021-01-13 NOTE — ED Notes (Signed)
Per pt she needs to use the restroom. Pt ambulated to restroom with a walker and this RN assistance at this time

## 2021-01-13 NOTE — ED Notes (Signed)
Attempted to call [facility] to provide report once this RN was informed that she was from a facility no answer

## 2021-01-18 ENCOUNTER — Ambulatory Visit (INDEPENDENT_AMBULATORY_CARE_PROVIDER_SITE_OTHER): Payer: Medicare Other

## 2021-01-18 ENCOUNTER — Encounter: Payer: Self-pay | Admitting: Cardiology

## 2021-01-18 ENCOUNTER — Ambulatory Visit: Payer: Medicare Other | Admitting: Cardiology

## 2021-01-18 ENCOUNTER — Other Ambulatory Visit: Payer: Self-pay

## 2021-01-18 VITALS — BP 118/66 | HR 61 | Ht 65.0 in | Wt 139.0 lb

## 2021-01-18 DIAGNOSIS — C3492 Malignant neoplasm of unspecified part of left bronchus or lung: Secondary | ICD-10-CM

## 2021-01-18 DIAGNOSIS — I447 Left bundle-branch block, unspecified: Secondary | ICD-10-CM

## 2021-01-18 DIAGNOSIS — I4729 Other ventricular tachycardia: Secondary | ICD-10-CM

## 2021-01-18 DIAGNOSIS — I472 Ventricular tachycardia: Secondary | ICD-10-CM

## 2021-01-18 DIAGNOSIS — I428 Other cardiomyopathies: Secondary | ICD-10-CM

## 2021-01-18 DIAGNOSIS — I10 Essential (primary) hypertension: Secondary | ICD-10-CM

## 2021-01-18 NOTE — Patient Instructions (Signed)
Medication Instructions:  Your physician recommends that you continue on your current medications as directed. Please refer to the Current Medication list given to you today.  *If you need a refill on your cardiac medications before your next appointment, please call your pharmacy*   Lab Work: None If you have labs (blood work) drawn today and your tests are completely normal, you will receive your results only by: [place] (if you have MyChart) OR A paper copy in the mail If you have any lab test that is abnormal or we need to change your treatment, we will call you to review the results.   Testing/Procedures: A zio monitor was ordered today. It will remain on for 7 days. You will then return monitor and event diary in provided box. It takes 1-2 weeks for report to be downloaded and returned to [place]. We will call you with the results. If monitor falls off or has orange flashing light, please call Zio for further instructions.   ZIO  WHY IS MY DOCTOR PRESCRIBING ZIO? The Zio system is proven and trusted by physicians to detect and diagnose irregular heart rhythms -- and has been prescribed to hundreds of thousands of patients.  The FDA has cleared the Zio system to monitor for many different kinds of irregular heart rhythms. In a study, physicians were able to reach a diagnosis 90% of the time with the Zio system1.  You can wear the Zio monitor -- a small, discreet, comfortable patch -- during your normal day-to-day activity, including while you sleep, shower, and exercise, while it records every single heartbeat for analysis.  1Barrett, P., et al. Comparison of 24 Hour Holter Monitoring Versus 14 Day Novel Adhesive Patch Electrocardiographic Monitoring. [place], 2014.  ZIO VS. HOLTER MONITORING The Zio monitor can be comfortably worn for up to 14 days. Holter monitors can be worn for 24 to 48 hours, limiting the time to record any irregular heart rhythms you  may have. Zio is able to capture data for the 51% of patients who have their first symptom-triggered arrhythmia after 48 hours.1  LIVE WITHOUT RESTRICTIONS The Zio ambulatory cardiac monitor is a small, unobtrusive, and water-resistant patch--you might even forget you're wearing it. The Zio monitor records and stores every beat of your heart, whether you're sleeping, working out, or showering. Remove on: Sept 21st 2022    Follow-Up: At [facility], you and your health needs are our priority.  As part of our continuing mission to provide you with exceptional heart care, we have created designated Provider Care Teams.  These Care Teams include your primary Cardiologist (physician) and Advanced Practice Providers (APPs -  Physician Assistants and Nurse Practitioners) who all work together to provide you with the care you need, when you need it.  We recommend signing up for the patient portal called "MyChart".  Sign up information is provided on this After Visit Summary.  MyChart is used to connect with patients for Virtual Visits (Telemedicine).  Patients are able to view lab/test results, encounter notes, upcoming appointments, etc.  Non-urgent messages can be sent to your provider as well.   To learn more about what you can do with MyChart, go to [email or web address].    Your next appointment:   5 month(s)  The format for your next appointment:   In Person  Provider:   Jenne Campus, MD   Other Instructions

## 2021-01-18 NOTE — Progress Notes (Signed)
Subjective: 75 year old female presents the office today for for follow-up evaluation of left hallux ingrown toenail.  She states that it remains sore and is "not healing".  She has noticed still some swelling redness to the nail border.  She took Keflex as directed.  No purulence.  She is no fevers or chills.  No other concerns.  Objective: AAO x3, NAD DP/PT pulses palpable bilaterally, CRT less than 3 seconds Incurvation noted medial aspect left hallux toenail with localized edema erythema still present as well as tenderness to palpation.  There is no drainage or pus.  No ascending cellulitis.  No open lesions or pre-ulcerative lesions.  No pain with calf compression, swelling, warmth, erythema  Assessment: Ingrown toenail left medial hallux  Plan: -All treatment options discussed with the patient including all alternatives, risks, complications.  -At this time, recommended partial nail removal without chemical matricectomy to the left medial due to infection. Risks and complications were discussed with the patient for which they understand and  verbally consent to the procedure. Under sterile conditions a total of 3 mL of a mixture of 2% lidocaine plain and 0.5% Marcaine plain was infiltrated in a hallux block fashion. Once anesthetized, the skin was prepped in sterile fashion. A tourniquet was then applied. Next the medial border of the hallux nail border was sharply excised making sure to remove the entire offending nail border. Once the nail was removed, the area was debrided and the underlying skin was intact.  No purulence noted.  The area was irrigated and hemostasis was obtained.  A dry sterile dressing was applied. After application of the dressing the tourniquet was removed and there is found to be an immediate capillary refill time to the digit. The patient tolerated the procedure well any complications. Post procedure instructions were discussed the patient for which he verbally  understood. Follow-up in one week for nail check or sooner if any problems are to arise. Discussed signs/symptoms of worsening infection and directed to call the office immediately should any occur or go directly to the emergency room. In the meantime, encouraged to call the office with any questions, concerns, changes symptoms. -Patient encouraged to call the office with any questions, concerns, change in symptoms.   Trula Slade DPM

## 2021-01-18 NOTE — Progress Notes (Signed)
Cardiology Office Note:    Date:  01/18/2021   ID:  Stefanie Camacho, DOB 06-27-1945, MRN [id number]  PCP:  [name], MD  Cardiologist:  Jenne Campus, MD    Referring MD: [name], MD   Chief Complaint  Patient presents with   Follow-up  Not have dizziness  History of Present Illness:    Stefanie Camacho is a 75 y.o. female with past medical history significant for left bundle branch block, nonischemic cardiomyopathy with ejection fraction 40 to 45%, essential hypertension, dyslipidemia, ventricular ectopy with nonsustained ventricular tachycardia.  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  That being aggressively treated with radiation as well as chemotherapy Comes today to [place] for follow-up overall cardiac wise seems to be doing well but the problem is dizziness she said she got vertigo anytime she turns her head very quickly it triggered her vertigo on top of that look like he does have orthostatic hypotension when she gets up very quickly she would get dizzy also, complain of having some dizziness even when she is sitting down.  In March she wear Zio patch but only for 2 days and she did not trigger monitor event once she did have some episode of nonsustained ventricular tachycardia but significant of this finding is unclear at this moment.  I offered her another Zio patch for 1 week to see if there is any correlation between her symptoms of dizziness and arrhythmia.  Past Medical History:  Diagnosis Date   Acute CVA (cerebrovascular accident) ([place]) 01/09/2020   Adenocarcinoma of left lung, stage 4 ([place]) 01/18/2020   Anxiety    Blood clots in brain    Chronic kidney disease due to hypertension 05/16/2020   Chronic kidney disease, stage 3a ([place]) 05/16/2020   Chronic respiratory failure ([place]) 05/16/2020   Cough 03/18/2020   Daytime somnolence 05/16/2020   Dizziness 09/25/2019   Dyslipidemia    Encounter for  antineoplastic chemotherapy 01/18/2020   Essential hypertension    Gastro-esophageal reflux disease without esophagitis    Generalized weakness 03/18/2020   GERD (gastroesophageal reflux disease)    Goals of care, counseling/discussion 01/18/2020   Hardening of the aorta (main artery of the heart) ([place]) 09/08/2020   Hyperlipidemia 05/16/2020   Hypothyroidism    Hypothyroidism, unspecified    Insomnia 05/16/2020   Intracranial bleeding ([place]) 01/09/2020   Left bundle branch block 06/04/2018   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   Long term (current) use of anticoagulants 05/16/2020   Lumbar foraminal stenosis 12/30/2019   Lung mass 05/16/2020   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[place]) 05/16/2020   Major depression, single episode 05/16/2020   Malignant neoplasm of lower lobe, left bronchus or lung ([place]) 01/14/2020   NICM (nonischemic cardiomyopathy) ([place])    Nonischemic cardiomyopathy ([place]) 06/04/2018   Ejection fraction 45% in summer 2019   Nonobstructive cardiomyopathy ([place])    Obstructive sleep apnea syndrome    Recurrent falls 07/12/2020   Rosacea 05/16/2020   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[place]) 01/28/2020   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   Single subsegmental pulmonary embolism without acute cor pulmonale ([place]) 03/25/2020   Sleep apnea    Stress incontinence (female) (female) 05/16/2020   TIA (transient ischemic attack) 01/08/2020   Vertigo 09/25/2019    Past Surgical History:  Procedure Laterality Date   ABDOMINAL HYSTERECTOMY  BLADDER NECK RECONSTRUCTION     BRONCHIAL BIOPSY  01/12/2020   Procedure: BRONCHIAL BIOPSIES;  Surgeon: Garner Nash, DO;  Location: [facility];  Service: Pulmonary;;   BRONCHIAL BRUSHINGS  01/12/2020   Procedure: BRONCHIAL BRUSHINGS;  Surgeon: Garner Nash, DO;  Location: [facility];  Service: Pulmonary;;   BRONCHIAL NEEDLE  ASPIRATION BIOPSY  01/12/2020   Procedure: BRONCHIAL NEEDLE ASPIRATION BIOPSIES;  Surgeon: Garner Nash, DO;  Location: [facility];  Service: Pulmonary;;   BRONCHIAL WASHINGS  01/12/2020   Procedure: BRONCHIAL WASHINGS;  Surgeon: Garner Nash, DO;  Location: [facility];  Service: Pulmonary;;   SHOULDER SURGERY     TONSILLECTOMY     VIDEO BRONCHOSCOPY WITH ENDOBRONCHIAL ULTRASOUND  01/12/2020   Procedure: VIDEO BRONCHOSCOPY WITH ENDOBRONCHIAL ULTRASOUND;  Surgeon: Garner Nash, DO;  Location: [facility];  Service: Pulmonary;;    Current Medications: Current Meds  Medication Sig   apixaban (ELIQUIS) 5 MG TABS tablet Take 1 tablet (5 mg total) by mouth 2 (two) times daily.   atorvastatin (LIPITOR) 10 MG tablet Take 1 tablet (10 mg total) by mouth daily at 6 PM.   Ensure Plus (ENSURE PLUS) LIQD Take 237 mLs by mouth as needed (For Nutritional Supplemntation. Unknow strength or amount).   FLUoxetine (PROZAC) 20 MG capsule Take 20 mg by mouth daily.   levETIRAcetam (KEPPRA) 250 MG tablet Take 250 mg by mouth 2 (two) times daily.   levothyroxine (SYNTHROID) 75 MCG tablet Take 1 tablet (75 mcg total) by mouth daily.   loperamide (IMODIUM) 2 MG capsule Take 2 mg by mouth as needed for diarrhea or loose stools.   losartan (COZAAR) 50 MG tablet Take 1 tablet (50 mg total) by mouth daily.   metoprolol succinate (TOPROL-XL) 25 MG 24 hr tablet Take 25 mg by mouth daily.   osimertinib mesylate (TAGRISSO) 80 MG tablet Take 2 tablets (160 mg total) by mouth daily.   pantoprazole (PROTONIX) 40 MG tablet Take 1 tablet by mouth once daily (Patient taking differently: Take 40 mg by mouth every evening.)   Polyethyl Glycol-Propyl Glycol (SYSTANE OP) Apply 1 drop to eye as needed (Eye dryness/ Unknown strength).   prochlorperazine (COMPAZINE) 10 MG tablet Take 1 tablet (10 mg total) by mouth every 6 (six) hours as needed for nausea or vomiting.   spironolactone (ALDACTONE) 25 MG tablet Take 25 mg by  mouth daily.   triamcinolone cream (KENALOG) 0.1 % Apply 1 application topically daily.     Allergies:   Sertraline hcl   Social History   Socioeconomic History   Marital status: Divorced    Spouse name: Not on file   Number of children: Not on file   Years of education: Not on file   Highest education level: Not on file  Occupational History   Not on file  Tobacco Use   Smoking status: Never   Smokeless tobacco: Never  Vaping Use   Vaping Use: Never used  Substance and Sexual Activity   Alcohol use: Not Currently   Drug use: Never   Sexual activity: Not on file  Other Topics Concern   Not on file  Social History Narrative   Not on file   Social Determinants of Health   Financial Resource Strain: Not on file  Food Insecurity: Not on file  Transportation Needs: Not on file  Physical Activity: Not on file  Stress: Not on file  Social Connections: Not on file     Family History: The patient's family  history includes Heart disease in her father, maternal grandfather, mother, and paternal grandfather; Stroke in her father and mother. ROS:   Please see the history of present illness.    All 14 point review of systems negative except as described per history of present illness  EKGs/Labs/Other Studies Reviewed:      Recent Labs: 01/02/2021: ALT 10; BUN 14; Creatinine 1.18; Hemoglobin 12.6; Platelet Count 123; Potassium 3.8; Sodium 141  Recent Lipid Panel    Component Value Date/Time   CHOL 127 01/09/2020 0416   CHOL 133 07/17/2018 0933   TRIG 112 01/09/2020 0416   HDL 38 (L) 01/09/2020 0416   HDL 40 07/17/2018 0933   CHOLHDL 3.3 01/09/2020 0416   VLDL 22 01/09/2020 0416   LDLCALC 67 01/09/2020 0416   LDLCALC 67 07/17/2018 0933    Physical Exam:    VS:  BP 118/66 (BP Location: Right Arm, Patient Position: Sitting)   Pulse 61   Ht 5\' 5"  (1.651 m)   Wt 139 lb (63 kg)   SpO2 97%   BMI 23.13 kg/m     Wt Readings from Last 3 Encounters:  01/18/21 139 lb  (63 kg)  01/13/21 130 lb (59 kg)  01/04/21 140 lb 11.2 oz (63.8 kg)     GEN:  Well nourished, well developed in no acute distress HEENT: Normal NECK: No JVD; No carotid bruits LYMPHATICS: No lymphadenopathy CARDIAC: RRR, no murmurs, no rubs, no gallops RESPIRATORY:  Clear to auscultation without rales, wheezing or rhonchi  ABDOMEN: Soft, non-tender, non-distended MUSCULOSKELETAL:  No edema; No deformity  SKIN: Warm and dry LOWER EXTREMITIES: no swelling NEUROLOGIC:  Alert and oriented x 3 PSYCHIATRIC:  Normal affect   ASSESSMENT:    1. Essential hypertension   2. Nonsustained ventricular tachycardia ([place])   3. NICM (nonischemic cardiomyopathy) ([place])   4. Left bundle branch block   5. Adenocarcinoma of left lung, stage 4 ([facility])    PLAN:    In order of problems listed above:  Essential hypertension blood pressure well controlled continue present management. Nonsustained ventricular tachycardia.  Will do another monitor to see if there is any correlation between symptoms and her tachycardia.  She is already on beta-blocker. Nonischemic cardiomyopathy.  Stable. Adenocarcinoma of the lung.  Aggressively treated metastasis to the brain as well as to the bone.   Medication Adjustments/Labs and Tests Ordered: Current medicines are reviewed at length with the patient today.  Concerns regarding medicines are outlined above.  No orders of the defined types were placed in this encounter.  Medication changes: No orders of the defined types were placed in this encounter.   Signed, Park Liter, MD, [facility] 01/18/2021 11:47 AM    [place]

## 2021-01-19 ENCOUNTER — Telehealth: Payer: Self-pay | Admitting: Medical Oncology

## 2021-01-19 ENCOUNTER — Telehealth: Payer: Self-pay | Admitting: Pharmacist

## 2021-01-19 NOTE — Telephone Encounter (Signed)
Oral Chemotherapy Pharmacist Encounter  Dispensed samples to patient:   Medication: Tagrisso (osimertinib) Instructions: Take 2 tablets (160 mg total) by mouth daily Quantity dispensed: 10 tablets Days supply: 5 Manufacturer: [employer] Lot: [id number]  Exp: 01/05/2023  Leron Croak, PharmD, BCPS Hematology/Oncology Clinical Pharmacist [facility] [phone] 01/19/2021 11:48 AM

## 2021-01-19 NOTE — Telephone Encounter (Signed)
Stefanie Camacho will pick up Tagrisso tablets today.

## 2021-01-25 ENCOUNTER — Other Ambulatory Visit: Payer: Self-pay

## 2021-01-25 ENCOUNTER — Ambulatory Visit
Admission: RE | Admit: 2021-01-25 | Discharge: 2021-01-25 | Disposition: A | Discharge status: Home / Self Care | Payer: Medicare Other | Source: Ambulatory Visit | Attending: Internal Medicine | Admitting: Internal Medicine

## 2021-01-25 ENCOUNTER — Telehealth: Payer: Self-pay

## 2021-01-25 ENCOUNTER — Other Ambulatory Visit: Payer: Self-pay | Admitting: Internal Medicine

## 2021-01-25 DIAGNOSIS — C3492 Malignant neoplasm of unspecified part of left bronchus or lung: Secondary | ICD-10-CM

## 2021-01-25 DIAGNOSIS — C3490 Malignant neoplasm of unspecified part of unspecified bronchus or lung: Secondary | ICD-10-CM | POA: Diagnosis not present

## 2021-01-25 DIAGNOSIS — R220 Localized swelling, mass and lump, head: Secondary | ICD-10-CM | POA: Diagnosis not present

## 2021-01-25 DIAGNOSIS — C7949 Secondary malignant neoplasm of other parts of nervous system: Secondary | ICD-10-CM

## 2021-01-25 DIAGNOSIS — R930 Abnormal findings on diagnostic imaging of skull and head, not elsewhere classified: Secondary | ICD-10-CM | POA: Diagnosis not present

## 2021-01-25 MED ORDER — GADOBENATE DIMEGLUMINE 529 MG/ML IV SOLN
13.0000 mL | Freq: Once | INTRAVENOUS | Status: AC | PRN
  Administered 2021-01-25: 13 mL via INTRAVENOUS

## 2021-01-25 MED ORDER — OSIMERTINIB MESYLATE 80 MG PO TABS: 160.0000 mg | ORAL_TABLET | Freq: Every day | ORAL | 3 refills | Status: DC

## 2021-01-25 NOTE — Telephone Encounter (Signed)
Rx of Tagrisso has been faxed to [facility] and confirmation received.

## 2021-01-26 ENCOUNTER — Inpatient Hospital Stay: Payer: Medicare Other | Attending: Internal Medicine | Admitting: Internal Medicine

## 2021-01-26 ENCOUNTER — Other Ambulatory Visit: Payer: Self-pay | Admitting: Medical Oncology

## 2021-01-26 VITALS — BP 148/75 | HR 62 | Temp 98.1°F | Resp 18 | Ht 65.0 in | Wt 140.1 lb

## 2021-01-26 DIAGNOSIS — N1831 Chronic kidney disease, stage 3a: Secondary | ICD-10-CM | POA: Diagnosis not present

## 2021-01-26 DIAGNOSIS — C3432 Malignant neoplasm of lower lobe, left bronchus or lung: Secondary | ICD-10-CM | POA: Diagnosis not present

## 2021-01-26 DIAGNOSIS — Z8673 Personal history of transient ischemic attack (TIA), and cerebral infarction without residual deficits: Secondary | ICD-10-CM | POA: Insufficient documentation

## 2021-01-26 DIAGNOSIS — C3492 Malignant neoplasm of unspecified part of left bronchus or lung: Secondary | ICD-10-CM

## 2021-01-26 DIAGNOSIS — Z86711 Personal history of pulmonary embolism: Secondary | ICD-10-CM | POA: Insufficient documentation

## 2021-01-26 DIAGNOSIS — Z923 Personal history of irradiation: Secondary | ICD-10-CM | POA: Diagnosis not present

## 2021-01-26 DIAGNOSIS — C7949 Secondary malignant neoplasm of other parts of nervous system: Secondary | ICD-10-CM | POA: Insufficient documentation

## 2021-01-26 DIAGNOSIS — C7931 Secondary malignant neoplasm of brain: Secondary | ICD-10-CM | POA: Insufficient documentation

## 2021-01-26 DIAGNOSIS — E785 Hyperlipidemia, unspecified: Secondary | ICD-10-CM | POA: Insufficient documentation

## 2021-01-26 DIAGNOSIS — Z79899 Other long term (current) drug therapy: Secondary | ICD-10-CM | POA: Diagnosis not present

## 2021-01-26 DIAGNOSIS — Z7901 Long term (current) use of anticoagulants: Secondary | ICD-10-CM | POA: Insufficient documentation

## 2021-01-26 DIAGNOSIS — C7951 Secondary malignant neoplasm of bone: Secondary | ICD-10-CM | POA: Insufficient documentation

## 2021-01-26 DIAGNOSIS — E039 Hypothyroidism, unspecified: Secondary | ICD-10-CM | POA: Diagnosis not present

## 2021-01-26 DIAGNOSIS — I129 Hypertensive chronic kidney disease with stage 1 through stage 4 chronic kidney disease, or unspecified chronic kidney disease: Secondary | ICD-10-CM | POA: Diagnosis not present

## 2021-01-26 MED ORDER — OSIMERTINIB MESYLATE 80 MG PO TABS: 160.0000 mg | ORAL_TABLET | Freq: Every day | ORAL | 3 refills | Status: DC

## 2021-01-26 NOTE — Progress Notes (Signed)
[facility], [place] [address] [phone]   Interval Evaluation  Date of Service: 01/26/21 Patient Name: Randye Treichler Patient MRN: [id number] Patient DOB: 1946-01-07 Provider: Ventura Sellers, MD  Identifying Statement:  Shakendra Griffeth is a 75 y.o. 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, leptomeningeal metastases   Primary Cancer:  Oncologic History: Oncology History  Malignant neoplasm of lower lobe, left bronchus or lung ([place])  01/14/2020 Initial Diagnosis   Malignant neoplasm of lower lobe, left bronchus or lung ([place])   07/26/2020 Cancer Staging   Staging form: Lung, AJCC 8th Edition - Clinical: Stage IVB (cT3, cN2, cM1c) - Signed by Curt Bears, MD on 07/26/2020   Adenocarcinoma of left lung, stage 4 ([place])  01/18/2020 Initial Diagnosis   Adenocarcinoma of left lung, stage 4 ([place])   01/28/2020 -  Chemotherapy   The patient had [No matching medication found in this treatment plan]  for chemotherapy treatment.     05/25/2020 Cancer Staging   Staging form: Lung, AJCC 8th Edition - Clinical: Stage IVB (cT3, cN2, cM1c) - Signed by Curt Bears, MD on 05/25/2020    CNS Oncologic History 02/02/20: Completes WBRT with Dr. Lisbeth Renshaw  Interval History:  Channie Bostick presents today for follow up after recent MRI brain.  No new or progressive complaints today.  Vertigo is rare, doesn't need Antivert because episodes are generally quite brief.  No further UTI symptoms.  Continues to take Keppra 218m BID, seizure free. Still on TBur[place]with Dr. MJulien Nordmann    H+P (01/29/20) Patient presented to medical attention on 01/09/20 with sudden onset "marching" left sided facial numbness.  The episode drew suspicion for stroke, and during workup patient was found to have innumerable brain metastases with additional foci of leptomeningeal enhancement secondary to widely metastatic lung cancer.  She returned to baseline and was discharged with  KGolva brain disease has since been treated with whole brain radiotherapy with Dr. MLisbeth Renshaw she is scheduled to complete treatment next week.  She has difficulty with ambulation because of a hip metastasis, but since being treated with RT that pain has improved as has her walking.  Plans to meet with Dr. MJulien Nordmannnext week to discuss plans for therapeutic targeting of EGFR mutation.    Medications: Current Outpatient Medications on File Prior to Visit  Medication Sig Dispense Refill   apixaban (ELIQUIS) 5 MG TABS tablet Take 1 tablet (5 mg total) by mouth 2 (two) times daily. 90 tablet 1   atorvastatin (LIPITOR) 10 MG tablet Take 1 tablet (10 mg total) by mouth daily at 6 PM. 90 tablet 3   Ensure Plus (ENSURE PLUS) LIQD Take 237 mLs by mouth as needed (For Nutritional Supplemntation. Unknow strength or amount).     FLUoxetine (PROZAC) 20 MG capsule Take 20 mg by mouth daily.     levETIRAcetam (KEPPRA) 250 MG tablet Take 250 mg by mouth 2 (two) times daily.     levothyroxine (SYNTHROID) 75 MCG tablet Take 1 tablet (75 mcg total) by mouth daily. 30 tablet 1   loperamide (IMODIUM) 2 MG capsule Take 2 mg by mouth as needed for diarrhea or loose stools.     losartan (COZAAR) 50 MG tablet Take 1 tablet (50 mg total) by mouth daily. 90 tablet 1   metoprolol succinate (TOPROL-XL) 25 MG 24 hr tablet Take 25 mg by mouth daily.     pantoprazole (PROTONIX) 40 MG tablet Take 1 tablet by mouth once daily (Patient taking  differently: Take 40 mg by mouth every evening.) 30 tablet 0   Polyethyl Glycol-Propyl Glycol (SYSTANE OP) Apply 1 drop to eye as needed (Eye dryness/ Unknown strength).     prochlorperazine (COMPAZINE) 10 MG tablet Take 1 tablet (10 mg total) by mouth every 6 (six) hours as needed for nausea or vomiting. 45 tablet 5   spironolactone (ALDACTONE) 25 MG tablet Take 25 mg by mouth daily.     triamcinolone cream (KENALOG) 0.1 % Apply 1 application topically daily.     No current facility-administered  medications on file prior to visit.    Allergies:  Allergies  Allergen Reactions   Sertraline Hcl Other (See Comments)   Past Medical History:  Past Medical History:  Diagnosis Date   Acute CVA (cerebrovascular accident) ([place]) 01/09/2020   Adenocarcinoma of left lung, stage 4 ([place]) 01/18/2020   Anxiety    Blood clots in brain    Chronic kidney disease due to hypertension 05/16/2020   Chronic kidney disease, stage 3a ([place]) 05/16/2020   Chronic respiratory failure ([place]) 05/16/2020   Cough 03/18/2020   Daytime somnolence 05/16/2020   Dizziness 09/25/2019   Dyslipidemia    Encounter for antineoplastic chemotherapy 01/18/2020   Essential hypertension    Gastro-esophageal reflux disease without esophagitis    Generalized weakness 03/18/2020   GERD (gastroesophageal reflux disease)    Goals of care, counseling/discussion 01/18/2020   Hardening of the aorta (main artery of the heart) ([place]) 09/08/2020   Hyperlipidemia 05/16/2020   Hypothyroidism    Hypothyroidism, unspecified    Insomnia 05/16/2020   Intracranial bleeding ([place]) 01/09/2020   Left bundle branch block 06/04/2018   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   Long term (current) use of anticoagulants 05/16/2020   Lumbar foraminal stenosis 12/30/2019   Lung mass 05/16/2020   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[place]) 05/16/2020   Major depression, single episode 05/16/2020   Malignant neoplasm of lower lobe, left bronchus or lung ([place]) 01/14/2020   NICM (nonischemic cardiomyopathy) ([place])    Nonischemic cardiomyopathy ([place]) 06/04/2018   Ejection fraction 45% in summer 2019   Nonobstructive cardiomyopathy ([place])    Obstructive sleep apnea syndrome    Recurrent falls 07/12/2020   Rosacea 05/16/2020   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[place]) 01/28/2020   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   Single subsegmental pulmonary embolism without  acute cor pulmonale ([place]) 03/25/2020   Sleep apnea    Stress incontinence (female) (female) 05/16/2020   TIA (transient ischemic attack) 01/08/2020   Vertigo 09/25/2019   Past Surgical History:  Past Surgical History:  Procedure Laterality Date   ABDOMINAL HYSTERECTOMY     BLADDER NECK RECONSTRUCTION     BRONCHIAL BIOPSY  01/12/2020   Procedure: BRONCHIAL BIOPSIES;  Surgeon: Garner Nash, DO;  Location: [facility];  Service: Pulmonary;;   BRONCHIAL BRUSHINGS  01/12/2020   Procedure: BRONCHIAL BRUSHINGS;  Surgeon: Garner Nash, DO;  Location: [facility];  Service: Pulmonary;;   BRONCHIAL NEEDLE ASPIRATION BIOPSY  01/12/2020   Procedure: BRONCHIAL NEEDLE ASPIRATION BIOPSIES;  Surgeon: Garner Nash, DO;  Location: [facility];  Service: Pulmonary;;   BRONCHIAL WASHINGS  01/12/2020   Procedure: BRONCHIAL WASHINGS;  Surgeon: Garner Nash, DO;  Location: [facility];  Service: Pulmonary;;   SHOULDER SURGERY     TONSILLECTOMY     VIDEO BRONCHOSCOPY WITH ENDOBRONCHIAL ULTRASOUND  01/12/2020   Procedure: VIDEO BRONCHOSCOPY WITH ENDOBRONCHIAL ULTRASOUND;  Surgeon: Garner Nash, DO;  Location: [facility];  Service: Pulmonary;;   Social History:  Social History   Socioeconomic History   Marital status: Divorced    Spouse name: Not on file   Number of children: Not on file   Years of education: Not on file   Highest education level: Not on file  Occupational History   Not on file  Tobacco Use   Smoking status: Never   Smokeless tobacco: Never  Vaping Use   Vaping Use: Never used  Substance and Sexual Activity   Alcohol use: Not Currently   Drug use: Never   Sexual activity: Not on file  Other Topics Concern   Not on file  Social History Narrative   Not on file   Social Determinants of Health   Financial Resource Strain: Not on file  Food Insecurity: Not on file  Transportation Needs: Not on file  Physical Activity: Not on file  Stress: Not on file  Social  Connections: Not on file  Intimate Partner Violence: Not on file   Family History:  Family History  Problem Relation Age of Onset   Heart disease Mother    Stroke Mother    Heart disease Father    Stroke Father    Heart disease Maternal Grandfather    Heart disease Paternal Grandfather     Review of Systems: Constitutional: Doesn't report fevers, chills or abnormal weight loss Eyes: Doesn't report blurriness of vision Ears, nose, mouth, throat, and face: Doesn't report sore throat Respiratory: Doesn't report cough, dyspnea or wheezes Cardiovascular: Doesn't report palpitation, chest discomfort  Gastrointestinal:  Doesn't report nausea, constipation, diarrhea GU: +dysuria Skin: Doesn't report skin rashes Neurological: Per HPI Musculoskeletal: Doesn't report joint pain Behavioral/Psych: Doesn't report anxiety  Physical Exam: Vitals:   01/26/21 1237  BP: (!) 148/75  Pulse: 62  Resp: 18  Temp: 98.1 F (36.7 C)  SpO2: 98%    KPS: 80. General: Alert, cooperative, pleasant, in no acute distress Head: Normal EENT: No conjunctival injection or scleral icterus.  Lungs: Resp effort normal Cardiac: Regular rate Abdomen: Non-distended abdomen Skin: No rashes cyanosis or petechiae. Extremities: No clubbing or edema  Neurologic Exam: Mental Status: Awake, alert, attentive to examiner. Oriented to self and environment. Language is fluent with intact comprehension.  Cranial Nerves: Visual acuity is grossly normal. Visual fields are full. Extra-ocular movements intact. No ptosis. Face is symmetric Motor: Tone and bulk are normal. Power is full in both arms and legs. Reflexes are symmetric, no pathologic reflexes present.  Sensory: Intact to light touch Gait: Orthopedic limitation  Labs: I have reviewed the data as listed    Component Value Date/Time   NA 141 01/02/2021 0955   K 3.8 01/02/2021 0955   CL 103 01/02/2021 0955   CO2 27 01/02/2021 0955   GLUCOSE 84 01/02/2021  0955   BUN 14 01/02/2021 0955   CREATININE 1.18 (H) 01/02/2021 0955   CALCIUM 9.6 01/02/2021 0955   PROT 6.8 01/02/2021 0955   PROT 6.9 07/17/2018 0934   ALBUMIN 3.7 01/02/2021 0955   ALBUMIN 4.1 07/17/2018 0934   AST 14 (L) 01/02/2021 0955   ALT 10 01/02/2021 0955   ALKPHOS 149 (H) 01/02/2021 0955   BILITOT 0.7 01/02/2021 0955   GFRNONAA 49 (L) 01/02/2021 0955   GFRAA >60 02/01/2020 1419   Lab Results  Component Value Date   WBC 4.3 01/02/2021   NEUTROABS 3.1 01/02/2021   HGB 12.6 01/02/2021   HCT 37.5 01/02/2021  MCV 92.8 01/02/2021   PLT 123 (L) 01/02/2021   Imaging:  [facility] Clinician Interpretation: I have personally reviewed the CNS images as listed.  My interpretation, in the context of the patient's clinical presentation, is stable disease  CT HEAD WO CONTRAST (5MM)  Result Date: 01/13/2021 CLINICAL DATA:  Head trauma, minor (Age >= 65y) EXAM: CT HEAD WITHOUT CONTRAST TECHNIQUE: Contiguous axial images were obtained from the base of the skull through the vertex without intravenous contrast. COMPARISON:  01/08/2020 FINDINGS: Brain: No acute intracranial abnormality. Specifically, no hemorrhage, hydrocephalus, mass lesion, acute infarction, or significant intracranial injury. Vascular: No hyperdense vessel or unexpected calcification. Skull: No acute calvarial abnormality. Sinuses/Orbits: No acute findings Other: None IMPRESSION: No acute intracranial abnormality. Electronically Signed   By: Rolm Baptise M.D.   On: 01/13/2021 02:07   CT Chest W Contrast  Result Date: 01/03/2021 CLINICAL DATA:  Non-small cell lung cancer, oral chemotherapy ongoing EXAM: CT CHEST, ABDOMEN, AND PELVIS WITH CONTRAST TECHNIQUE: Multidetector CT imaging of the chest, abdomen and pelvis was performed following the standard protocol during bolus administration of intravenous contrast. CONTRAST:  61m OMNIPAQUE IOHEXOL 350 MG/ML SOLN COMPARISON:  09/26/2020 FINDINGS: CT CHEST FINDINGS Cardiovascular:  Heart is normal in size.  No pericardial effusion. No evidence of thoracic aortic aneurysm. Mediastinum/Nodes: No suspicious mediastinal lymphadenopathy. Visualized thyroid is unremarkable. Lungs/Pleura: Moderate left pleural effusion, unchanged. However, there has been interval development of a tiny left apical pneumothorax component (series 7/image 16), new from prior. Suspected radiation changes in the left upper and lower lobes (series 7/image 75). Additional scattered subcentimeter predominantly ground-glass nodules in the lungs bilaterally, left upper lobe predominant, grossly unchanged and indeterminate. However, solid nodules are also present, favoring progressive metastatic disease. For example: --5 mm nodule in the anterior right lower lobe (series 7/image 86), previously 2-3 mm --7 mm solid nodule in the central right upper lobe (series 7/image 87) previously 4 mm --7 mm solid nodule inferiorly in the right middle lobe (series 7/image 108), unchanged --5 mm solid nodule in the medial right lower lobe (series 7/image 109), previously 2-3 mm Stable scarring/atelectasis in the lateral right lower lobe (series 7/image 96). Musculoskeletal: Multifocal sclerotic metastases in the visualized axial and appendicular skeleton, mildly progressive. For example, an 8 mm lesion in the medial right clavicle (series 3/image 11) previously measured 4 mm. A 10 mm lesion in the right T10 vertebral body (series 3/image 37) previously measured 7 mm. Mild degenerative changes of the thoracic spine. CT ABDOMEN PELVIS FINDINGS Hepatobiliary: 15 mm cyst in the central hepatic dome (series 3/image 45). 21 mm probable hemangioma in the posterior right hepatic lobe (series 3/image 52). Additional hypervascular lesions in the right liver (series 3/images 46 and 59) are similar to the prior and may reflect flash filling hemangiomas. Gallbladder is unremarkable. No intrahepatic or extrahepatic ductal dilatation. Pancreas: Within normal  limits. Spleen: Within normal limits. Adrenals/Urinary Tract: Adrenal glands are within normal limits. Right renal cysts, measuring up to 2.2 cm in the right lower pole. No hydronephrosis. Bladder is within normal limits. Stomach/Bowel: Stomach is within normal limits. No evidence of bowel obstruction. Cecum and ileocecal valve are located in the left mid abdomen. Normal appendix (series 3/image 67). No colonic wall thickening or mass is evident on CT. Vascular/Lymphatic: No evidence of abdominal aortic aneurysm. No suspicious abdominopelvic lymphadenopathy. Reproductive: Status post hysterectomy. Bilateral ovaries are within normal limits. Other: No abdominopelvic ascites. Musculoskeletal: Multifocal sclerotic metastases in the visualized axial and appendicular skeleton, mildly progressive. For example,  an 8 mm lesion in the left L3 vertebral body (series 3/image 66) previously measured 6 mm. A 13 mm lesion in the right iliac bone (series 3/image 81) previously measured 11 mm. Mild superior endplate compression fracture deformity at L1 (sagittal image 87), unchanged. No retropulsion. Mild degenerative changes of the lumbar spine. IMPRESSION: Moderate left pleural effusion, unchanged, with interval development of a tiny left apical pneumothorax component. Radiation changes in the left hemithorax. Progressive solid right lung nodules, suspicious for metastases, with index lesions as above. Additional scattered ground-glass nodules in the lungs bilaterally, left upper lobe predominant, grossly unchanged and indeterminate. Stable hepatic lesions, favoring benign hemangiomas and cysts, although continued attention on follow-up is suggested. Progressive osseous metastases in the visualized axial and appendicular skeleton, with index lesions as above. These results will be called to the ordering clinician or representative by the Radiologist Assistant, and communication documented in the PACS or [employer].  Electronically Signed   By: Julian Hy M.D.   On: 01/03/2021 01:36   CT Cervical Spine Wo Contrast  Result Date: 01/13/2021 CLINICAL DATA:  Neck trauma (Age >= 65y) EXAM: CT CERVICAL SPINE WITHOUT CONTRAST TECHNIQUE: Multidetector CT imaging of the cervical spine was performed without intravenous contrast. Multiplanar CT image reconstructions were also generated. COMPARISON:  None. FINDINGS: Alignment: Normal Skull base and vertebrae: No acute fracture. No primary bone lesion or focal pathologic process. Soft tissues and spinal canal: No prevertebral fluid or swelling. No visible canal hematoma. Disc levels: Degenerative disc disease, most pronounced at C5-6 and C6-7. Diffuse bilateral degenerative facet disease. Upper chest: Left pleural effusion partially imaged. Scarring in the apices. Other: None IMPRESSION: Degenerative disc and facet disease.  No acute bony abnormality. Left pleural effusion partially imaged in the left upper chest. Electronically Signed   By: Rolm Baptise M.D.   On: 01/13/2021 01:59   MR BRAIN W WO CONTRAST  Result Date: 01/25/2021 CLINICAL DATA:  Lung cancer with history of radiation therapy EXAM: MRI HEAD WITHOUT AND WITH CONTRAST TECHNIQUE: Multiplanar, multiecho pulse sequences of the brain and surrounding structures were obtained without and with intravenous contrast. CONTRAST:  59m MULTIHANCE GADOBENATE DIMEGLUMINE 529 MG/ML IV SOLN COMPARISON:  10/21/2020 FINDINGS: BRAIN New Lesions: None. Larger lesions: None. Stable or Smaller lesions: Three remaining areas of abnormal enhancement: 1. Sulcal enhancement along the left central sulcus marked on 12:116 2. Nodule in the inferior left temporal lobe on 12:55, 8 mm. 3. Cortically based left occipital nodule with hemosiderin on 12:74, 9 mm Other Brain findings: No hydrocephalus, collection, infarct, or acute hemorrhage. No vasogenic edema Vascular: Normal flow voids Skull and upper cervical spine: Hyperostosis interna. Stable  sclerotic focus in the right parietal bone measuring 13 mm. Sinuses/Orbits: Negative IMPRESSION: No new or progressive metastatic disease. Electronically Signed   By: JJorje GuildM.D.   On: 01/25/2021 21:07   CT Abdomen Pelvis W Contrast  Result Date: 01/03/2021 CLINICAL DATA:  Non-small cell lung cancer, oral chemotherapy ongoing EXAM: CT CHEST, ABDOMEN, AND PELVIS WITH CONTRAST TECHNIQUE: Multidetector CT imaging of the chest, abdomen and pelvis was performed following the standard protocol during bolus administration of intravenous contrast. CONTRAST:  871mOMNIPAQUE IOHEXOL 350 MG/ML SOLN COMPARISON:  09/26/2020 FINDINGS: CT CHEST FINDINGS Cardiovascular: Heart is normal in size.  No pericardial effusion. No evidence of thoracic aortic aneurysm. Mediastinum/Nodes: No suspicious mediastinal lymphadenopathy. Visualized thyroid is unremarkable. Lungs/Pleura: Moderate left pleural effusion, unchanged. However, there has been interval development of a tiny left apical pneumothorax component (series  7/image 16), new from prior. Suspected radiation changes in the left upper and lower lobes (series 7/image 75). Additional scattered subcentimeter predominantly ground-glass nodules in the lungs bilaterally, left upper lobe predominant, grossly unchanged and indeterminate. However, solid nodules are also present, favoring progressive metastatic disease. For example: --5 mm nodule in the anterior right lower lobe (series 7/image 86), previously 2-3 mm --7 mm solid nodule in the central right upper lobe (series 7/image 87) previously 4 mm --7 mm solid nodule inferiorly in the right middle lobe (series 7/image 108), unchanged --5 mm solid nodule in the medial right lower lobe (series 7/image 109), previously 2-3 mm Stable scarring/atelectasis in the lateral right lower lobe (series 7/image 96). Musculoskeletal: Multifocal sclerotic metastases in the visualized axial and appendicular skeleton, mildly progressive. For  example, an 8 mm lesion in the medial right clavicle (series 3/image 11) previously measured 4 mm. A 10 mm lesion in the right T10 vertebral body (series 3/image 37) previously measured 7 mm. Mild degenerative changes of the thoracic spine. CT ABDOMEN PELVIS FINDINGS Hepatobiliary: 15 mm cyst in the central hepatic dome (series 3/image 45). 21 mm probable hemangioma in the posterior right hepatic lobe (series 3/image 52). Additional hypervascular lesions in the right liver (series 3/images 46 and 59) are similar to the prior and may reflect flash filling hemangiomas. Gallbladder is unremarkable. No intrahepatic or extrahepatic ductal dilatation. Pancreas: Within normal limits. Spleen: Within normal limits. Adrenals/Urinary Tract: Adrenal glands are within normal limits. Right renal cysts, measuring up to 2.2 cm in the right lower pole. No hydronephrosis. Bladder is within normal limits. Stomach/Bowel: Stomach is within normal limits. No evidence of bowel obstruction. Cecum and ileocecal valve are located in the left mid abdomen. Normal appendix (series 3/image 67). No colonic wall thickening or mass is evident on CT. Vascular/Lymphatic: No evidence of abdominal aortic aneurysm. No suspicious abdominopelvic lymphadenopathy. Reproductive: Status post hysterectomy. Bilateral ovaries are within normal limits. Other: No abdominopelvic ascites. Musculoskeletal: Multifocal sclerotic metastases in the visualized axial and appendicular skeleton, mildly progressive. For example, an 8 mm lesion in the left L3 vertebral body (series 3/image 66) previously measured 6 mm. A 13 mm lesion in the right iliac bone (series 3/image 81) previously measured 11 mm. Mild superior endplate compression fracture deformity at L1 (sagittal image 87), unchanged. No retropulsion. Mild degenerative changes of the lumbar spine. IMPRESSION: Moderate left pleural effusion, unchanged, with interval development of a tiny left apical pneumothorax  component. Radiation changes in the left hemithorax. Progressive solid right lung nodules, suspicious for metastases, with index lesions as above. Additional scattered ground-glass nodules in the lungs bilaterally, left upper lobe predominant, grossly unchanged and indeterminate. Stable hepatic lesions, favoring benign hemangiomas and cysts, although continued attention on follow-up is suggested. Progressive osseous metastases in the visualized axial and appendicular skeleton, with index lesions as above. These results will be called to the ordering clinician or representative by the Radiologist Assistant, and communication documented in the PACS or [employer]. Electronically Signed   By: Julian Hy M.D.   On: 01/03/2021 01:36     Assessment/Plan Brain Metastases Leptomeningeal Metastases  Shereen Rosman is clinically and radiographically stable today.  No new or progressive deficits.  For vertigo, use Meclizine PRN as prior.  Will con't Keppra 239m BID.  We appreciate the opportunity to participate in the care of VBrooksie Ellwanger    We ask that VRuth Kovichreturn to clinic in 4 months following next brain MRI, or sooner as needed.  All questions  were answered. The patient knows to call the clinic with any problems, questions or concerns. No barriers to learning were detected.  The total time spent in the encounter was 30 minutes and more than 50% was on counseling and review of test results   Ventura Sellers, MD Medical Director of Neuro-Oncology [facility] 01/26/21 12:33 PM

## 2021-01-30 ENCOUNTER — Ambulatory Visit: Payer: Medicare Other | Admitting: Podiatry

## 2021-01-30 ENCOUNTER — Inpatient Hospital Stay: Payer: Medicare Other

## 2021-01-30 ENCOUNTER — Telehealth: Payer: Self-pay | Admitting: Internal Medicine

## 2021-01-30 ENCOUNTER — Other Ambulatory Visit: Payer: Self-pay

## 2021-01-30 DIAGNOSIS — L600 Ingrowing nail: Secondary | ICD-10-CM | POA: Diagnosis not present

## 2021-01-30 DIAGNOSIS — I472 Ventricular tachycardia: Secondary | ICD-10-CM | POA: Diagnosis not present

## 2021-01-30 NOTE — Telephone Encounter (Signed)
Scheduled appt per 9/22 los. Pt is aware.

## 2021-01-31 ENCOUNTER — Telehealth: Payer: Self-pay | Admitting: *Deleted

## 2021-01-31 NOTE — Telephone Encounter (Signed)
Received call from [facility].  They called to advise that the patient tested positive for COVID today.  She is currently on Tagrisso and PCP will not order covid antiviral medication and wants Dr Julien Nordmann to order because they don't want to order anything that will interact with her chemo pill.   Routed to Dr Julien Nordmann and staff.  Return call to [facility] is requested.   Claris Che, Med Tech is requesting call back.  [phone]

## 2021-02-01 ENCOUNTER — Ambulatory Visit: Payer: Medicare Other | Admitting: Internal Medicine

## 2021-02-01 ENCOUNTER — Other Ambulatory Visit: Payer: Medicare Other

## 2021-02-01 NOTE — Telephone Encounter (Signed)
I have spoken with pts med tech, Rodena Piety and advised as indicated. She expressed understanding of this information and advised she will share the information with facility provider.

## 2021-02-03 ENCOUNTER — Telehealth: Payer: Self-pay

## 2021-02-03 NOTE — Progress Notes (Signed)
Subjective: 75 year old female presents the office today for evaluation after having ingrown toenail medial hallux.  She states that it is doing well.  She gets some tenderness along the ingrown toenails of her left and right second digits.  No swelling or any drainage or pus.  She does not think they are bad enough to have a partial nail avulsion performed.  No other concerns.  Objective: AAO x3, NAD DP/PT pulses palpable bilaterally, CRT less than 3 seconds Status post partial nail avulsion of the left hallux toenail.  It appears to be healed.  There is no edema, erythema, drainage or pus or any signs of infection.  There is no tenderness.  Incurvation noted along the second digits bilaterally on the left to the medial aspect To the lateral aspect with mild tenderness at the distal portion.  There is no drainage or pus.  No is any cellulitis.  No signs of infection. No pain with calf compression, swelling, warmth, erythema  Assessment: Ingrown toenails  Plan: -All treatment options discussed with the patient including all alternatives, risks, complications.  -Discussed partial nail avulsion but she does not think it is gotten much about performing lesser digits.  I did debride nails with any complications or bleeding.  Monitor for any signs or symptoms of infection. -Hallux toenail doing well.  Monitor for any infection or reoccurrence. -Patient encouraged to call the office with any questions, concerns, change in symptoms.   Trula Slade DPM

## 2021-02-03 NOTE — Telephone Encounter (Signed)
Spoke with patient regarding results and recommendation.  Patient verbalizes understanding and is agreeable to plan of care. Advised patient to call back with any issues or concerns.  

## 2021-02-03 NOTE — Telephone Encounter (Signed)
-----   Message from Park Liter, MD sent at 02/03/2021 10:41 AM EDT ----- Nothing worrisome on the monitor, continue present management

## 2021-02-10 ENCOUNTER — Other Ambulatory Visit: Payer: Self-pay | Admitting: Radiation Therapy

## 2021-02-16 DIAGNOSIS — C3432 Malignant neoplasm of lower lobe, left bronchus or lung: Secondary | ICD-10-CM | POA: Diagnosis not present

## 2021-02-16 DIAGNOSIS — N1831 Chronic kidney disease, stage 3a: Secondary | ICD-10-CM | POA: Diagnosis not present

## 2021-02-16 DIAGNOSIS — E785 Hyperlipidemia, unspecified: Secondary | ICD-10-CM | POA: Diagnosis not present

## 2021-02-16 DIAGNOSIS — Z23 Encounter for immunization: Secondary | ICD-10-CM | POA: Diagnosis not present

## 2021-02-16 DIAGNOSIS — E039 Hypothyroidism, unspecified: Secondary | ICD-10-CM | POA: Diagnosis not present

## 2021-02-16 DIAGNOSIS — I10 Essential (primary) hypertension: Secondary | ICD-10-CM | POA: Diagnosis not present

## 2021-02-17 ENCOUNTER — Inpatient Hospital Stay: Payer: Medicare Other | Admitting: Internal Medicine

## 2021-02-17 ENCOUNTER — Other Ambulatory Visit: Payer: Self-pay

## 2021-02-17 ENCOUNTER — Inpatient Hospital Stay: Payer: Medicare Other | Attending: Internal Medicine

## 2021-02-17 VITALS — BP 133/65 | HR 70 | Temp 97.1°F | Resp 18 | Ht 65.0 in | Wt 137.2 lb

## 2021-02-17 DIAGNOSIS — Z5111 Encounter for antineoplastic chemotherapy: Secondary | ICD-10-CM

## 2021-02-17 DIAGNOSIS — C7802 Secondary malignant neoplasm of left lung: Secondary | ICD-10-CM | POA: Insufficient documentation

## 2021-02-17 DIAGNOSIS — C3492 Malignant neoplasm of unspecified part of left bronchus or lung: Secondary | ICD-10-CM

## 2021-02-17 DIAGNOSIS — C7931 Secondary malignant neoplasm of brain: Secondary | ICD-10-CM | POA: Diagnosis not present

## 2021-02-17 DIAGNOSIS — Z923 Personal history of irradiation: Secondary | ICD-10-CM | POA: Insufficient documentation

## 2021-02-17 DIAGNOSIS — Z7901 Long term (current) use of anticoagulants: Secondary | ICD-10-CM | POA: Diagnosis not present

## 2021-02-17 DIAGNOSIS — C7951 Secondary malignant neoplasm of bone: Secondary | ICD-10-CM | POA: Diagnosis not present

## 2021-02-17 DIAGNOSIS — E039 Hypothyroidism, unspecified: Secondary | ICD-10-CM | POA: Insufficient documentation

## 2021-02-17 DIAGNOSIS — C3490 Malignant neoplasm of unspecified part of unspecified bronchus or lung: Secondary | ICD-10-CM

## 2021-02-17 DIAGNOSIS — C3432 Malignant neoplasm of lower lobe, left bronchus or lung: Secondary | ICD-10-CM | POA: Diagnosis not present

## 2021-02-17 DIAGNOSIS — Z86711 Personal history of pulmonary embolism: Secondary | ICD-10-CM | POA: Diagnosis not present

## 2021-02-17 DIAGNOSIS — E785 Hyperlipidemia, unspecified: Secondary | ICD-10-CM | POA: Diagnosis not present

## 2021-02-17 DIAGNOSIS — Z79899 Other long term (current) drug therapy: Secondary | ICD-10-CM | POA: Insufficient documentation

## 2021-02-17 DIAGNOSIS — Z8673 Personal history of transient ischemic attack (TIA), and cerebral infarction without residual deficits: Secondary | ICD-10-CM | POA: Diagnosis not present

## 2021-02-17 LAB — CMP (CANCER CENTER ONLY)
ALT: 17 U/L (ref 0–44)
AST: 18 U/L (ref 15–41)
Albumin: 3.9 g/dL (ref 3.5–5.0)
Alkaline Phosphatase: 156 U/L — ABNORMAL HIGH (ref 38–126)
Anion gap: 8 (ref 5–15)
BUN: 18 mg/dL (ref 8–23)
CO2: 26 mmol/L (ref 22–32)
Calcium: 9.3 mg/dL (ref 8.9–10.3)
Chloride: 101 mmol/L (ref 98–111)
Creatinine: 1.09 mg/dL — ABNORMAL HIGH (ref 0.44–1.00)
GFR, Estimated: 54 mL/min — ABNORMAL LOW (ref >60)
Glucose, Bld: 86 mg/dL (ref 70–99)
Potassium: 3.9 mmol/L (ref 3.5–5.1)
Sodium: 135 mmol/L (ref 135–145)
Total Bilirubin: 0.6 mg/dL (ref 0.3–1.2)
Total Protein: 6.8 g/dL (ref 6.5–8.1)

## 2021-02-17 LAB — CBC WITH DIFFERENTIAL (CANCER CENTER ONLY)
Abs Immature Granulocytes: 0.01 10*3/uL (ref 0.00–0.07)
Basophils Absolute: 0 10*3/uL (ref 0.0–0.1)
Basophils Relative: 1 %
Eosinophils Absolute: 0 10*3/uL (ref 0.0–0.5)
Eosinophils Relative: 1 %
HCT: 34 % — ABNORMAL LOW (ref 36.0–46.0)
Hemoglobin: 11.3 g/dL — ABNORMAL LOW (ref 12.0–15.0)
Immature Granulocytes: 0 %
Lymphocytes Relative: 18 %
Lymphs Abs: 0.7 10*3/uL (ref 0.7–4.0)
MCH: 30.1 pg (ref 26.0–34.0)
MCHC: 33.2 g/dL (ref 30.0–36.0)
MCV: 90.7 fL (ref 80.0–100.0)
Monocytes Absolute: 0.3 10*3/uL (ref 0.1–1.0)
Monocytes Relative: 7 %
Neutro Abs: 2.9 10*3/uL (ref 1.7–7.7)
Neutrophils Relative %: 73 %
Platelet Count: 117 10*3/uL — ABNORMAL LOW (ref 150–400)
RBC: 3.75 MIL/uL — ABNORMAL LOW (ref 3.87–5.11)
RDW: 13.9 % (ref 11.5–15.5)
WBC Count: 3.9 10*3/uL — ABNORMAL LOW (ref 4.0–10.5)
nRBC: 0 % (ref 0.0–0.2)

## 2021-02-17 NOTE — Progress Notes (Signed)
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 2) pulmonary embolus in the right main pulmonary artery diagnosed in November 2021.   Biomarker Findings Microsatellite status - MS-Stable Tumor Mutational Burden - 1 Muts/Mb Genomic Findings For a complete list of the genes assayed, please refer to the Appendix. EGFR exon 19 deletion ([id number]) 7 Disease relevant genes with no reportable alterations: ALK, BRAF, ERBB2, KRAS, MET, RET, ROS1  PDL1 expression: 2%.   PRIOR THERAPY: Whole brain radiation and radiotherapy to the osseous metastases under the care of Dr. Lisbeth Renshaw. Last treatment expected on 02/02/20   CURRENT THERAPY:  1) Tagrisso 160 mg p.o. daily. First dose on 02/01/20.  Status post 13 months of treatment. 2) Eliquis 5 mg p.o. twice daily  INTERVAL HISTORY: Stefanie Camacho 75 y.o. female returns to the clinic today for follow-up visit accompanied by her son.  The patient is feeling fine today with no concerning complaints except for mild fatigue.  She also continues to have few episodes of diarrhea but improved with Imodium.  She was diagnosed with COVID-19 around 2-3 weeks ago and recovering well.  The patient denied having any current chest pain, shortness of breath, cough or hemoptysis.  The patient has no current nausea, vomiting, abdominal pain or constipation.  She has no weight loss or night sweats.  She continues to tolerate her treatment with Tagrisso fairly well.  She is here today for evaluation and repeat blood  work.   MEDICAL HISTORY: Past Medical History:  Diagnosis Date   Acute CVA (cerebrovascular accident) ([place]) 01/09/2020   Adenocarcinoma of left lung, stage 4 ([place]) 01/18/2020   Anxiety    Blood clots in brain    Chronic kidney disease due to hypertension 05/16/2020   Chronic kidney disease, stage 3a ([place]) 05/16/2020   Chronic respiratory failure ([place]) 05/16/2020   Cough 03/18/2020   Daytime somnolence 05/16/2020   Dizziness 09/25/2019   Dyslipidemia    Encounter for antineoplastic chemotherapy 01/18/2020   Essential hypertension    Gastro-esophageal reflux disease without esophagitis    Generalized weakness 03/18/2020   GERD (gastroesophageal reflux disease)    Goals of care, counseling/discussion 01/18/2020   Hardening of the aorta (main artery of the heart) ([place]) 09/08/2020   Hyperlipidemia 05/16/2020   Hypothyroidism    Hypothyroidism, unspecified    Insomnia 05/16/2020   Intracranial bleeding ([place]) 01/09/2020   Left bundle branch block 06/04/2018   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   Long term (current) use of anticoagulants 05/16/2020   Lumbar foraminal stenosis 12/30/2019   Lung mass 05/16/2020   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[place]) 05/16/2020   Major depression, single episode 05/16/2020   Malignant neoplasm of lower lobe, left bronchus or lung ([place]) 01/14/2020   NICM (nonischemic cardiomyopathy) ([place])    Nonischemic cardiomyopathy ([place]) 06/04/2018   Ejection fraction 45% in summer 2019   Nonobstructive cardiomyopathy ([place])    Obstructive sleep apnea syndrome    Recurrent falls 07/12/2020   Rosacea 05/16/2020   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  MEDICATIONS:  Current Outpatient Medications  Medication Sig Dispense Refill   apixaban (ELIQUIS) 5 MG TABS tablet Take 1 tablet (5 mg total) by mouth 2 (two) times daily. 90 tablet 1   atorvastatin (LIPITOR) 10 MG tablet Take 1 tablet (10 mg total) by mouth daily at 6 PM. 90 tablet 3   Ensure Plus (ENSURE PLUS) LIQD Take 237 mLs by mouth as needed (For Nutritional Supplemntation. Unknow strength or amount). (Patient not taking: Reported on 01/26/2021)     FLUoxetine (PROZAC) 20 MG capsule Take 20 mg by mouth daily.     levETIRAcetam (KEPPRA) 250 MG tablet Take 250 mg by mouth 2 (two) times daily.     levothyroxine (SYNTHROID) 75 MCG tablet Take 1 tablet (75 mcg total) by mouth daily. 30 tablet 1   loperamide (IMODIUM) 2 MG capsule Take 2 mg by mouth as needed for diarrhea or loose stools.     losartan (COZAAR) 50 MG tablet Take 1 tablet (50 mg total) by mouth daily. 90 tablet 1   metoprolol succinate (TOPROL-XL) 25 MG 24 hr tablet Take 25 mg by mouth daily.     osimertinib mesylate (TAGRISSO) 80 MG tablet Take 2 tablets (160 mg total) by mouth daily. 60 tablet 3   pantoprazole (PROTONIX) 40 MG tablet Take 1 tablet by mouth once daily (Patient taking differently: Take 40 mg by mouth every evening.) 30 tablet 0   Polyethyl Glycol-Propyl Glycol (SYSTANE OP) Apply 1 drop to eye as needed (Eye dryness/ Unknown strength).     prochlorperazine (COMPAZINE) 10 MG tablet Take 1 tablet (10 mg total) by mouth every 6 (six) hours as needed for nausea or vomiting. 45 tablet 5   spironolactone (ALDACTONE) 25 MG tablet Take 25 mg by mouth daily.     triamcinolone cream (KENALOG) 0.1 % Apply 1 application topically daily.     No current facility-administered medications for this visit.    SURGICAL HISTORY:  Past Surgical History:  Procedure Laterality Date   ABDOMINAL HYSTERECTOMY     BLADDER NECK  RECONSTRUCTION     BRONCHIAL BIOPSY  01/12/2020   Procedure: BRONCHIAL BIOPSIES;  Surgeon: Garner Nash, DO;  Location: [facility];  Service: Pulmonary;;   BRONCHIAL BRUSHINGS  01/12/2020   Procedure: BRONCHIAL BRUSHINGS;  Surgeon: Garner Nash, DO;  Location: [facility];  Service: Pulmonary;;   BRONCHIAL NEEDLE ASPIRATION BIOPSY  01/12/2020   Procedure: BRONCHIAL NEEDLE ASPIRATION BIOPSIES;  Surgeon: Garner Nash, DO;  Location: [facility];  Service: Pulmonary;;   BRONCHIAL WASHINGS  01/12/2020   Procedure: BRONCHIAL WASHINGS;  Surgeon: Garner Nash, DO;  Location: [facility];  Service: Pulmonary;;   SHOULDER SURGERY     TONSILLECTOMY     VIDEO BRONCHOSCOPY WITH ENDOBRONCHIAL ULTRASOUND  01/12/2020   Procedure: VIDEO BRONCHOSCOPY WITH ENDOBRONCHIAL ULTRASOUND;  Surgeon: Garner Nash, DO;  Location: [facility];  Service: Pulmonary;;    REVIEW OF SYSTEMS:  A comprehensive review of systems was negative except for: Constitutional: positive for fatigue Gastrointestinal: positive for diarrhea Musculoskeletal: positive for muscle weakness   PHYSICAL EXAMINATION: General appearance: alert, cooperative, fatigued, and no distress Head: Normocephalic, without  obvious abnormality, atraumatic Neck: no adenopathy, no JVD, supple, symmetrical, trachea midline, and thyroid not enlarged, symmetric, no tenderness/mass/nodules Lymph nodes: Cervical, supraclavicular, and axillary nodes normal. Resp: clear to auscultation bilaterally Back: symmetric, no curvature. ROM normal. No CVA tenderness. Cardio: regular rate and rhythm, S1, S2 normal, no murmur, click, rub or gallop GI: soft, non-tender; bowel sounds normal; no masses,  no organomegaly Extremities: extremities normal, atraumatic, no cyanosis or edema  ECOG PERFORMANCE STATUS: 1 - Symptomatic but completely ambulatory  Blood pressure 133/65, pulse 70, temperature (!) 97.1 F (36.2 C), temperature source Tympanic, resp. rate 18,  height '5\' 5"'  (1.651 m), weight 137 lb 3.2 oz (62.2 kg), SpO2 98 %.  LABORATORY DATA: Lab Results  Component Value Date   WBC 3.9 (L) 02/17/2021   HGB 11.3 (L) 02/17/2021   HCT 34.0 (L) 02/17/2021   MCV 90.7 02/17/2021   PLT 117 (L) 02/17/2021      Chemistry      Component Value Date/Time   NA 141 01/02/2021 0955   K 3.8 01/02/2021 0955   CL 103 01/02/2021 0955   CO2 27 01/02/2021 0955   BUN 14 01/02/2021 0955   CREATININE 1.18 (H) 01/02/2021 0955      Component Value Date/Time   CALCIUM 9.6 01/02/2021 0955   ALKPHOS 149 (H) 01/02/2021 0955   AST 14 (L) 01/02/2021 0955   ALT 10 01/02/2021 0955   BILITOT 0.7 01/02/2021 0955       RADIOGRAPHIC STUDIES: MR BRAIN W WO CONTRAST  Result Date: 01/25/2021 CLINICAL DATA:  Lung cancer with history of radiation therapy EXAM: MRI HEAD WITHOUT AND WITH CONTRAST TECHNIQUE: Multiplanar, multiecho pulse sequences of the brain and surrounding structures were obtained without and with intravenous contrast. CONTRAST:  55m MULTIHANCE GADOBENATE DIMEGLUMINE 529 MG/ML IV SOLN COMPARISON:  10/21/2020 FINDINGS: BRAIN New Lesions: None. Larger lesions: None. Stable or Smaller lesions: Three remaining areas of abnormal enhancement: 1. Sulcal enhancement along the left central sulcus marked on 12:116 2. Nodule in the inferior left temporal lobe on 12:55, 8 mm. 3. Cortically based left occipital nodule with hemosiderin on 12:74, 9 mm Other Brain findings: No hydrocephalus, collection, infarct, or acute hemorrhage. No vasogenic edema Vascular: Normal flow voids Skull and upper cervical spine: Hyperostosis interna. Stable sclerotic focus in the right parietal bone measuring 13 mm. Sinuses/Orbits: Negative IMPRESSION: No new or progressive metastatic disease. Electronically Signed   By: JJorje GuildM.D.   On: 01/25/2021 21:07   LONG TERM MONITOR (3-14 DAYS)  Result Date: 02/02/2021 Patch Wear Time:  6 days and 8 hours (2022-09-14T12:04:08-398 to  2022-09-20T20:32:18-0400) Patient had a min HR of 46 bpm, max HR of 164 bpm, and avg HR of 60 bpm. Predominant underlying rhythm was Sinus Rhythm. Bundle Branch Block/IVCD was present. QRS morphology changes were present throughout recording. 13 Supraventricular Tachycardia runs occurred, the run with the fastest interval lasting 5 beats with a max rate of 164 bpm, the longest lasting 15 beats with an avg rate of 109 bpm. Some episodes of Supraventricular Tachycardia conducted with possible aberrancy. Some episodes of Supraventricular Tachycardia may be possible Atrial Tachycardia with variable block. Isolated SVEs were rare (<1.0%), SVE Couplets were rare (<1.0%), and SVE Triplets were rare (<1.0%). Isolated VEs were occasional (1.6%, 8898), VE Couplets were rare (<1.0%, 2), and no VE Triplets were present. Ventricular Bigeminy and Trigeminy were present. Summary conclusions: Baseline intraventricular conduction delay. Supraventricular tachycardia total of 13 noted.    ASSESSMENT AND PLAN: This is a  very pleasant 75 years old white female recently diagnosed with a stage IV non-small cell lung cancer, adenocarcinoma in September 2021 and presented with left lower lobe lung mass in addition to extensive bilateral pulmonary metastasis and anterior mediastinal lymphadenopathy as well as a skeletal metastasis and metastatic disease to the brain with leptomeningeal disease.  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 She underwent whole brain irradiation and currently undergoing treatment with targeted therapy with Tagrisso 160 mg p.o. daily because of the leptomeningeal disease status post 13 months of treatment.  The patient has been tolerating this treatment well with no concerning adverse effect except for intermittent diarrhea improved with Imodium.  She is recovering from COVID-19 infection that happened 2 weeks ago. I recommended for the patient  to continue her current treatment with Tagrisso with the same dose. I will see her back for follow-up visit in 1 months for evaluation with repeat CT scan of the chest, abdomen pelvis for restaging of her disease. For the history of pulmonary embolism she will continue her current treatment with Eliquis. For the depression she will continue her current treatment with Prozac. For the history of leptomeningeal disease and brain metastasis, she will continue her routine follow-up visit and evaluation by Dr. Mickeal Skinner. The patient was advised to call immediately if she has any other concerning symptoms in the interval. The patient voices understanding of current disease status and treatment options and is in agreement with the current care plan.  All questions were answered. The patient knows to call the clinic with any problems, questions or concerns. We can certainly see the patient much sooner if necessary.  Disclaimer: This note was dictated with voice recognition software. Similar sounding words can inadvertently be transcribed and may not be corrected upon review.

## 2021-03-08 ENCOUNTER — Telehealth: Payer: Self-pay | Admitting: Medical Oncology

## 2021-03-08 NOTE — Telephone Encounter (Signed)
Needs  scan appt. Scheduled. Message sent to radiology.

## 2021-03-13 ENCOUNTER — Ambulatory Visit (HOSPITAL_COMMUNITY)
Admission: RE | Admit: 2021-03-13 | Discharge: 2021-03-13 | Disposition: A | Discharge status: Home / Self Care | Payer: Medicare Other | Source: Ambulatory Visit | Attending: Internal Medicine | Admitting: Internal Medicine

## 2021-03-13 DIAGNOSIS — R911 Solitary pulmonary nodule: Secondary | ICD-10-CM | POA: Diagnosis not present

## 2021-03-13 DIAGNOSIS — C3490 Malignant neoplasm of unspecified part of unspecified bronchus or lung: Secondary | ICD-10-CM | POA: Insufficient documentation

## 2021-03-13 DIAGNOSIS — J90 Pleural effusion, not elsewhere classified: Secondary | ICD-10-CM | POA: Diagnosis not present

## 2021-03-13 DIAGNOSIS — C7951 Secondary malignant neoplasm of bone: Secondary | ICD-10-CM | POA: Diagnosis not present

## 2021-03-13 DIAGNOSIS — K769 Liver disease, unspecified: Secondary | ICD-10-CM | POA: Diagnosis not present

## 2021-03-13 DIAGNOSIS — R918 Other nonspecific abnormal finding of lung field: Secondary | ICD-10-CM | POA: Diagnosis not present

## 2021-03-13 MED ORDER — IOHEXOL 350 MG/ML SOLN
75.0000 mL | Freq: Once | INTRAVENOUS | Status: AC | PRN
  Administered 2021-03-13: 75 mL via INTRAVENOUS

## 2021-03-17 ENCOUNTER — Inpatient Hospital Stay: Payer: Medicare Other | Attending: Internal Medicine

## 2021-03-17 ENCOUNTER — Other Ambulatory Visit: Payer: Self-pay

## 2021-03-17 DIAGNOSIS — C7931 Secondary malignant neoplasm of brain: Secondary | ICD-10-CM | POA: Insufficient documentation

## 2021-03-17 DIAGNOSIS — I129 Hypertensive chronic kidney disease with stage 1 through stage 4 chronic kidney disease, or unspecified chronic kidney disease: Secondary | ICD-10-CM | POA: Diagnosis not present

## 2021-03-17 DIAGNOSIS — Z79899 Other long term (current) drug therapy: Secondary | ICD-10-CM | POA: Insufficient documentation

## 2021-03-17 DIAGNOSIS — C3432 Malignant neoplasm of lower lobe, left bronchus or lung: Secondary | ICD-10-CM | POA: Insufficient documentation

## 2021-03-17 DIAGNOSIS — E039 Hypothyroidism, unspecified: Secondary | ICD-10-CM | POA: Insufficient documentation

## 2021-03-17 DIAGNOSIS — Z8673 Personal history of transient ischemic attack (TIA), and cerebral infarction without residual deficits: Secondary | ICD-10-CM | POA: Diagnosis not present

## 2021-03-17 DIAGNOSIS — C3490 Malignant neoplasm of unspecified part of unspecified bronchus or lung: Secondary | ICD-10-CM

## 2021-03-17 DIAGNOSIS — Z86711 Personal history of pulmonary embolism: Secondary | ICD-10-CM | POA: Insufficient documentation

## 2021-03-17 DIAGNOSIS — N1831 Chronic kidney disease, stage 3a: Secondary | ICD-10-CM | POA: Insufficient documentation

## 2021-03-17 DIAGNOSIS — C7951 Secondary malignant neoplasm of bone: Secondary | ICD-10-CM | POA: Insufficient documentation

## 2021-03-17 DIAGNOSIS — Z7901 Long term (current) use of anticoagulants: Secondary | ICD-10-CM | POA: Diagnosis not present

## 2021-03-17 LAB — CBC WITH DIFFERENTIAL (CANCER CENTER ONLY)
Abs Immature Granulocytes: 0.01 10*3/uL (ref 0.00–0.07)
Basophils Absolute: 0 10*3/uL (ref 0.0–0.1)
Basophils Relative: 0 %
Eosinophils Absolute: 0 10*3/uL (ref 0.0–0.5)
Eosinophils Relative: 1 %
HCT: 34.4 % — ABNORMAL LOW (ref 36.0–46.0)
Hemoglobin: 11.4 g/dL — ABNORMAL LOW (ref 12.0–15.0)
Immature Granulocytes: 0 %
Lymphocytes Relative: 18 %
Lymphs Abs: 0.7 10*3/uL (ref 0.7–4.0)
MCH: 30.2 pg (ref 26.0–34.0)
MCHC: 33.1 g/dL (ref 30.0–36.0)
MCV: 91.2 fL (ref 80.0–100.0)
Monocytes Absolute: 0.3 10*3/uL (ref 0.1–1.0)
Monocytes Relative: 8 %
Neutro Abs: 2.9 10*3/uL (ref 1.7–7.7)
Neutrophils Relative %: 73 %
Platelet Count: 126 10*3/uL — ABNORMAL LOW (ref 150–400)
RBC: 3.77 MIL/uL — ABNORMAL LOW (ref 3.87–5.11)
RDW: 14.3 % (ref 11.5–15.5)
WBC Count: 4 10*3/uL (ref 4.0–10.5)
nRBC: 0 % (ref 0.0–0.2)

## 2021-03-17 LAB — CMP (CANCER CENTER ONLY)
ALT: 19 U/L (ref 0–44)
AST: 16 U/L (ref 15–41)
Albumin: 3.5 g/dL (ref 3.5–5.0)
Alkaline Phosphatase: 205 U/L — ABNORMAL HIGH (ref 38–126)
Anion gap: 9 (ref 5–15)
BUN: 19 mg/dL (ref 8–23)
CO2: 26 mmol/L (ref 22–32)
Calcium: 9.2 mg/dL (ref 8.9–10.3)
Chloride: 105 mmol/L (ref 98–111)
Creatinine: 0.94 mg/dL (ref 0.44–1.00)
GFR, Estimated: >60 mL/min (ref >60)
Glucose, Bld: 80 mg/dL (ref 70–99)
Potassium: 4 mmol/L (ref 3.5–5.1)
Sodium: 140 mmol/L (ref 135–145)
Total Bilirubin: 0.7 mg/dL (ref 0.3–1.2)
Total Protein: 6.4 g/dL — ABNORMAL LOW (ref 6.5–8.1)

## 2021-03-23 ENCOUNTER — Inpatient Hospital Stay: Payer: Medicare Other | Admitting: Internal Medicine

## 2021-03-23 ENCOUNTER — Other Ambulatory Visit: Payer: Self-pay

## 2021-03-23 VITALS — BP 122/82 | HR 65 | Temp 97.8°F | Resp 16 | Ht 65.0 in | Wt 135.7 lb

## 2021-03-23 DIAGNOSIS — I129 Hypertensive chronic kidney disease with stage 1 through stage 4 chronic kidney disease, or unspecified chronic kidney disease: Secondary | ICD-10-CM | POA: Diagnosis not present

## 2021-03-23 DIAGNOSIS — N1831 Chronic kidney disease, stage 3a: Secondary | ICD-10-CM | POA: Diagnosis not present

## 2021-03-23 DIAGNOSIS — C3492 Malignant neoplasm of unspecified part of left bronchus or lung: Secondary | ICD-10-CM

## 2021-03-23 DIAGNOSIS — Z8673 Personal history of transient ischemic attack (TIA), and cerebral infarction without residual deficits: Secondary | ICD-10-CM | POA: Diagnosis not present

## 2021-03-23 DIAGNOSIS — Z79899 Other long term (current) drug therapy: Secondary | ICD-10-CM | POA: Diagnosis not present

## 2021-03-23 DIAGNOSIS — C7949 Secondary malignant neoplasm of other parts of nervous system: Secondary | ICD-10-CM | POA: Diagnosis not present

## 2021-03-23 DIAGNOSIS — C3490 Malignant neoplasm of unspecified part of unspecified bronchus or lung: Secondary | ICD-10-CM

## 2021-03-23 DIAGNOSIS — Z7901 Long term (current) use of anticoagulants: Secondary | ICD-10-CM | POA: Diagnosis not present

## 2021-03-23 DIAGNOSIS — C7931 Secondary malignant neoplasm of brain: Secondary | ICD-10-CM | POA: Diagnosis not present

## 2021-03-23 DIAGNOSIS — E039 Hypothyroidism, unspecified: Secondary | ICD-10-CM | POA: Diagnosis not present

## 2021-03-23 DIAGNOSIS — C7951 Secondary malignant neoplasm of bone: Secondary | ICD-10-CM | POA: Diagnosis not present

## 2021-03-23 DIAGNOSIS — C7932 Secondary malignant neoplasm of cerebral meninges: Secondary | ICD-10-CM

## 2021-03-23 DIAGNOSIS — Z86711 Personal history of pulmonary embolism: Secondary | ICD-10-CM | POA: Diagnosis not present

## 2021-03-23 DIAGNOSIS — C3432 Malignant neoplasm of lower lobe, left bronchus or lung: Secondary | ICD-10-CM

## 2021-03-23 DIAGNOSIS — Z5111 Encounter for antineoplastic chemotherapy: Secondary | ICD-10-CM

## 2021-03-23 NOTE — Progress Notes (Signed)
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 2) pulmonary embolus in the right main pulmonary artery diagnosed in November 2021.   Biomarker Findings Microsatellite status - MS-Stable Tumor Mutational Burden - 1 Muts/Mb Genomic Findings For a complete list of the genes assayed, please refer to the Appendix. EGFR exon 19 deletion ([id number]) 7 Disease relevant genes with no reportable alterations: ALK, BRAF, ERBB2, KRAS, MET, RET, ROS1  PDL1 expression: 2%.   PRIOR THERAPY: Whole brain radiation and radiotherapy to the osseous metastases under the care of Dr. Lisbeth Renshaw. Last treatment expected on 02/02/20   CURRENT THERAPY:  1) Tagrisso 160 mg p.o. daily. First dose on 02/01/20.  Status post 13 months of treatment. 2) Eliquis 5 mg p.o. twice daily  INTERVAL HISTORY: Stefanie Camacho 75 y.o. female returns to the clinic today for follow-up visit accompanied by her son.  The patient is feeling fine today with no concerning complaints except for intermittent headache and shortness of breath with exertion.  She has no current chest pain, cough or hemoptysis.  She denied having any fever or chills.  She has no nausea, vomiting, diarrhea or constipation.  She has no visual changes.  She has no recent weight loss or night sweats.  She continues to tolerate her treatment with Tagrisso fairly well.  The patient had repeat CT scan of the chest, abdomen pelvis performed recently and she is here for evaluation and discussion of her scan  results.    MEDICAL HISTORY: Past Medical History:  Diagnosis Date   Acute CVA (cerebrovascular accident) ([place]) 01/09/2020   Adenocarcinoma of left lung, stage 4 ([place]) 01/18/2020   Anxiety    Blood clots in brain    Chronic kidney disease due to hypertension 05/16/2020   Chronic kidney disease, stage 3a ([place]) 05/16/2020   Chronic respiratory failure ([place]) 05/16/2020   Cough 03/18/2020   Daytime somnolence 05/16/2020   Dizziness 09/25/2019   Dyslipidemia    Encounter for antineoplastic chemotherapy 01/18/2020   Essential hypertension    Gastro-esophageal reflux disease without esophagitis    Generalized weakness 03/18/2020   GERD (gastroesophageal reflux disease)    Goals of care, counseling/discussion 01/18/2020   Hardening of the aorta (main artery of the heart) ([place]) 09/08/2020   Hyperlipidemia 05/16/2020   Hypothyroidism    Hypothyroidism, unspecified    Insomnia 05/16/2020   Intracranial bleeding ([place]) 01/09/2020   Left bundle branch block 06/04/2018   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   Long term (current) use of anticoagulants 05/16/2020   Lumbar foraminal stenosis 12/30/2019   Lung mass 05/16/2020   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[place]) 05/16/2020   Major depression, single episode 05/16/2020   Malignant neoplasm of lower lobe, left bronchus or lung ([place]) 01/14/2020   NICM (nonischemic cardiomyopathy) ([place])    Nonischemic cardiomyopathy ([place]) 06/04/2018   Ejection fraction 45% in summer 2019   Nonobstructive cardiomyopathy ([place])    Obstructive sleep apnea syndrome    Recurrent falls 07/12/2020   Rosacea 05/16/2020   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  MEDICATIONS:  Current Outpatient Medications  Medication Sig Dispense Refill   apixaban (ELIQUIS) 5 MG TABS tablet Take 1 tablet (5 mg total) by mouth 2 (two) times daily. 90 tablet 1   atorvastatin (LIPITOR) 10 MG tablet Take 1 tablet (10 mg total) by mouth daily at 6 PM. 90 tablet 3   Ensure Plus (ENSURE PLUS) LIQD Take 237 mLs by mouth as needed (For Nutritional Supplemntation. Unknow strength or amount). (Patient not taking: Reported on 01/26/2021)     FLUoxetine (PROZAC) 20 MG capsule Take 20 mg by mouth daily.     levETIRAcetam (KEPPRA) 250 MG tablet Take 250 mg by mouth 2 (two) times daily.     levothyroxine (SYNTHROID) 75 MCG tablet Take 1 tablet (75 mcg total) by mouth daily. 30 tablet 1   loperamide (IMODIUM) 2 MG capsule Take 2 mg by mouth as needed for diarrhea or loose stools.     losartan (COZAAR) 50 MG tablet Take 1 tablet (50 mg total) by mouth daily. 90 tablet 1   metoprolol succinate (TOPROL-XL) 25 MG 24 hr tablet Take 25 mg by mouth daily.     osimertinib mesylate (TAGRISSO) 80 MG tablet Take 2 tablets (160 mg total) by mouth daily. 60 tablet 3   pantoprazole (PROTONIX) 40 MG tablet Take 1 tablet by mouth once daily (Patient taking differently: Take 40 mg by mouth every evening.) 30 tablet 0   Polyethyl Glycol-Propyl Glycol (SYSTANE OP) Apply 1 drop to eye as needed (Eye dryness/ Unknown strength).     prochlorperazine (COMPAZINE) 10 MG tablet Take 1 tablet (10 mg total) by mouth every 6 (six) hours as needed for nausea or vomiting. 45 tablet 5   spironolactone (ALDACTONE) 25 MG tablet Take 25 mg by mouth daily.     triamcinolone cream (KENALOG) 0.1 % Apply 1 application topically daily.     No current facility-administered medications for this visit.    SURGICAL HISTORY:  Past Surgical History:  Procedure Laterality Date   ABDOMINAL HYSTERECTOMY     BLADDER NECK  RECONSTRUCTION     BRONCHIAL BIOPSY  01/12/2020   Procedure: BRONCHIAL BIOPSIES;  Surgeon: Garner Nash, DO;  Location: [facility];  Service: Pulmonary;;   BRONCHIAL BRUSHINGS  01/12/2020   Procedure: BRONCHIAL BRUSHINGS;  Surgeon: Garner Nash, DO;  Location: [facility];  Service: Pulmonary;;   BRONCHIAL NEEDLE ASPIRATION BIOPSY  01/12/2020   Procedure: BRONCHIAL NEEDLE ASPIRATION BIOPSIES;  Surgeon: Garner Nash, DO;  Location: [facility];  Service: Pulmonary;;   BRONCHIAL WASHINGS  01/12/2020   Procedure: BRONCHIAL WASHINGS;  Surgeon: Garner Nash, DO;  Location: [facility];  Service: Pulmonary;;   SHOULDER SURGERY     TONSILLECTOMY     VIDEO BRONCHOSCOPY WITH ENDOBRONCHIAL ULTRASOUND  01/12/2020   Procedure: VIDEO BRONCHOSCOPY WITH ENDOBRONCHIAL ULTRASOUND;  Surgeon: Garner Nash, DO;  Location: [facility];  Service: Pulmonary;;    REVIEW OF SYSTEMS:  Constitutional: positive for fatigue Eyes: negative Ears, nose, mouth, throat, and face: negative Respiratory: positive for dyspnea on exertion Cardiovascular: negative Gastrointestinal: negative Genitourinary:negative Integument/breast: negative Hematologic/lymphatic: negative Musculoskeletal:positive for muscle weakness Neurological: negative Behavioral/Psych:  negative Endocrine: negative Allergic/Immunologic: negative   PHYSICAL EXAMINATION: General appearance: alert, cooperative, fatigued, and no distress Head: Normocephalic, without obvious abnormality, atraumatic Neck: no adenopathy, no JVD, supple, symmetrical, trachea midline, and thyroid not enlarged, symmetric, no tenderness/mass/nodules Lymph nodes: Cervical, supraclavicular, and axillary nodes normal. Resp: clear to auscultation bilaterally Back: symmetric, no curvature. ROM normal. No CVA tenderness. Cardio: regular rate and rhythm, S1, S2 normal, no murmur, click, rub or gallop GI: soft, non-tender; bowel sounds normal; no masses,  no  organomegaly Extremities: extremities normal, atraumatic, no cyanosis or edema Neurologic: Alert and oriented X 3, normal strength and tone. Normal symmetric reflexes. Normal coordination and gait  ECOG PERFORMANCE STATUS: 1 - Symptomatic but completely ambulatory  Blood pressure 122/82, pulse 65, temperature 97.8 F (36.6 C), temperature source Tympanic, resp. rate 16, height _0  (1.651 m), weight 135 lb 11.2 oz (61.6 kg), SpO2 97 %.  LABORATORY DATA: Lab Results  Component Value Date   WBC 4.0 03/17/2021   HGB 11.4 (L) 03/17/2021   HCT 34.4 (L) 03/17/2021   MCV 91.2 03/17/2021   PLT 126 (L) 03/17/2021      Chemistry      Component Value Date/Time   NA 140 03/17/2021 0957   K 4.0 03/17/2021 0957   CL 105 03/17/2021 0957   CO2 26 03/17/2021 0957   BUN 19 03/17/2021 0957   CREATININE 0.94 03/17/2021 0957      Component Value Date/Time   CALCIUM 9.2 03/17/2021 0957   ALKPHOS 205 (H) 03/17/2021 0957   AST 16 03/17/2021 0957   ALT 19 03/17/2021 0957   BILITOT 0.7 03/17/2021 0957       RADIOGRAPHIC STUDIES: CT Chest W Contrast  Result Date: 03/14/2021 CLINICAL DATA:  Primary Cancer Type: Lung Imaging Indication: Assess response to therapy Interval therapy since last imaging? Yes Initial Cancer Diagnosis Date: 01/12/2020; Established by: Biopsy-proven Detailed Pathology: Stage IV non-small cell lung cancer, adenocarcinoma. Primary Tumor location: Left lower lobe. Osseous metastases. Metastatic disease to the brain and leptomeningeal disease. Surgeries: Hysterectomy. Chemotherapy: Yes; Ongoing? Yes; Tagrisso daily. Immunotherapy? No Radiation therapy? Yes; Date Range: 01/18/2020 - 02/02/2020; Target: Brain, left lung, pelvis EXAM: CT CHEST, ABDOMEN AND PELVIS WITH CONTRAST TECHNIQUE: Multidetector CT imaging of the chest was performed during intravenous contrast administration. CONTRAST:  60m OMNIPAQUE IOHEXOL 350 MG/ML SOLN COMPARISON:  Most recent CT chest, abdomen and pelvis  01/02/2021. 01/25/2020 PET-CT. FINDINGS: CT CHEST FINDINGS Cardiovascular: No significant vascular findings. Normal heart size. Scattered left coronary artery calcifications. No pericardial effusion. Mediastinum/Nodes: No enlarged mediastinal, hilar, or axillary lymph nodes. Thyroid gland, trachea, and esophagus demonstrate no significant findings. Lungs/Pleura: Moderate left pleural effusion and associated atelectasis or consolidation, not significantly changed compared to prior examination. Scattered ground-glass nodules and opacities throughout the lungs are not appreciably changed compared to prior examination, however there are multiple solid and subsolid nodules present, which are slightly enlarged compared to prior examination, for example a 0.8 cm nodule of the right upper lobe, previously 0.6 cm (series 4, image 65) 0.4 cm nodule of the right lower lobe, previously no greater than 0.2 cm (series 4, image 83), and a 0.7 cm nodule of the right lower lobe, previously 0.4 cm (series 4, image 94). Musculoskeletal: No chest wall mass. Numerous sclerotic osseous metastases are not significantly changed. CT ABDOMEN PELVIS FINDINGS Hepatobiliary: No solid liver abnormality is seen. Unchanged subcapsular hemangioma of the peripheral posterior right lobe of the liver, hepatic segment VI (series 2, image 76). Additional probable subcentimeter  flash filling hemangioma of the anterior right lobe of the liver (series 2, image 53) and adjacent to the gallbladder fossa (series 2, image 64). No gallstones, gallbladder wall thickening, or biliary dilatation. Pancreas: Unremarkable. No pancreatic ductal dilatation or surrounding inflammatory changes. Spleen: Normal in size without significant abnormality. Adrenals/Urinary Tract: Adrenal glands are unremarkable. Kidneys are normal, without renal calculi, solid lesion, or hydronephrosis. Bladder is unremarkable. Stomach/Bowel: Stomach is within normal limits. Appendix appears  normal. No evidence of bowel wall thickening, distention, or inflammatory changes. Vascular/Lymphatic: No significant vascular findings are present. No enlarged abdominal or pelvic lymph nodes. Reproductive: Status post hysterectomy. Other: No abdominal wall hernia or abnormality. No abdominopelvic ascites. Musculoskeletal: No acute or significant osseous findings. Numerous sclerotic osseous metastases are unchanged. IMPRESSION: 1. Multiple solid and subsolid pulmonary nodules are slightly enlarged compared to prior examination and consistent with worsened pulmonary metastatic disease. 2. Background of scattered ground-glass nodules and opacities throughout the lungs are unchanged and remain nonspecific. 3. Moderate left pleural effusion and associated atelectasis or consolidation, not significantly changed compared to prior examination. 4. Unchanged widespread sclerotic osseous metastatic disease. 5. Unchanged liver lesions, most likely hemangiomata, liver metastases strongly disfavored given contrast enhancement characteristics. Continued attention on follow-up. Electronically Signed   By: Delanna Ahmadi M.D.   On: 03/14/2021 10:54   CT Abdomen Pelvis W Contrast  Result Date: 03/14/2021 CLINICAL DATA:  Primary Cancer Type: Lung Imaging Indication: Assess response to therapy Interval therapy since last imaging? Yes Initial Cancer Diagnosis Date: 01/12/2020; Established by: Biopsy-proven Detailed Pathology: Stage IV non-small cell lung cancer, adenocarcinoma. Primary Tumor location: Left lower lobe. Osseous metastases. Metastatic disease to the brain and leptomeningeal disease. Surgeries: Hysterectomy. Chemotherapy: Yes; Ongoing? Yes; Tagrisso daily. Immunotherapy? No Radiation therapy? Yes; Date Range: 01/18/2020 - 02/02/2020; Target: Brain, left lung, pelvis EXAM: CT CHEST, ABDOMEN AND PELVIS WITH CONTRAST TECHNIQUE: Multidetector CT imaging of the chest was performed during intravenous contrast administration.  CONTRAST:  10mL OMNIPAQUE IOHEXOL 350 MG/ML SOLN COMPARISON:  Most recent CT chest, abdomen and pelvis 01/02/2021. 01/25/2020 PET-CT. FINDINGS: CT CHEST FINDINGS Cardiovascular: No significant vascular findings. Normal heart size. Scattered left coronary artery calcifications. No pericardial effusion. Mediastinum/Nodes: No enlarged mediastinal, hilar, or axillary lymph nodes. Thyroid gland, trachea, and esophagus demonstrate no significant findings. Lungs/Pleura: Moderate left pleural effusion and associated atelectasis or consolidation, not significantly changed compared to prior examination. Scattered ground-glass nodules and opacities throughout the lungs are not appreciably changed compared to prior examination, however there are multiple solid and subsolid nodules present, which are slightly enlarged compared to prior examination, for example a 0.8 cm nodule of the right upper lobe, previously 0.6 cm (series 4, image 65) 0.4 cm nodule of the right lower lobe, previously no greater than 0.2 cm (series 4, image 83), and a 0.7 cm nodule of the right lower lobe, previously 0.4 cm (series 4, image 94). Musculoskeletal: No chest wall mass. Numerous sclerotic osseous metastases are not significantly changed. CT ABDOMEN PELVIS FINDINGS Hepatobiliary: No solid liver abnormality is seen. Unchanged subcapsular hemangioma of the peripheral posterior right lobe of the liver, hepatic segment VI (series 2, image 76). Additional probable subcentimeter flash filling hemangioma of the anterior right lobe of the liver (series 2, image 53) and adjacent to the gallbladder fossa (series 2, image 64). No gallstones, gallbladder wall thickening, or biliary dilatation. Pancreas: Unremarkable. No pancreatic ductal dilatation or surrounding inflammatory changes. Spleen: Normal in size without significant abnormality. Adrenals/Urinary Tract: Adrenal glands are unremarkable. Kidneys are normal, without  renal calculi, solid lesion, or  hydronephrosis. Bladder is unremarkable. Stomach/Bowel: Stomach is within normal limits. Appendix appears normal. No evidence of bowel wall thickening, distention, or inflammatory changes. Vascular/Lymphatic: No significant vascular findings are present. No enlarged abdominal or pelvic lymph nodes. Reproductive: Status post hysterectomy. Other: No abdominal wall hernia or abnormality. No abdominopelvic ascites. Musculoskeletal: No acute or significant osseous findings. Numerous sclerotic osseous metastases are unchanged. IMPRESSION: 1. Multiple solid and subsolid pulmonary nodules are slightly enlarged compared to prior examination and consistent with worsened pulmonary metastatic disease. 2. Background of scattered ground-glass nodules and opacities throughout the lungs are unchanged and remain nonspecific. 3. Moderate left pleural effusion and associated atelectasis or consolidation, not significantly changed compared to prior examination. 4. Unchanged widespread sclerotic osseous metastatic disease. 5. Unchanged liver lesions, most likely hemangiomata, liver metastases strongly disfavored given contrast enhancement characteristics. Continued attention on follow-up. Electronically Signed   By: Delanna Ahmadi M.D.   On: 03/14/2021 10:54     ASSESSMENT AND PLAN: This is a very pleasant 75 years old white female recently diagnosed with a stage IV non-small cell lung cancer, adenocarcinoma in September 2021 and presented with left lower lobe lung mass in addition to extensive bilateral pulmonary metastasis and anterior mediastinal lymphadenopathy as well as a skeletal metastasis and metastatic disease to the brain with leptomeningeal disease.  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 She underwent whole brain irradiation and currently undergoing treatment with targeted therapy with Tagrisso 160 mg p.o. daily because of the leptomeningeal disease  status post 15 months of treatment.  The patient has been tolerating her treatment well with no concerning adverse effects. She had repeat CT scan of the chest, abdomen pelvis performed recently.  I personally and independently reviewed the scan images and discussed the result and showed the images to the patient and her son. Her scan showed some mild disease progression and at least 3 pulmonary nodules in the right lung that increased by few millimeter compared to the previous scan. I had a lengthy discussion with the patient and her son today about her current condition and treatment options.  I discussed with the patient the option of referral to radiation oncology for consideration of SBRT to these 3 nodules or at least the largest ones versus continuous treatment with Tagrisso and repeat CT scan of the chest in 2 months for reevaluation of her disease and then consideration of SBRT at that time if this nodule continues to increase in size.  The patient would like to wait a little bit longer until after the holiday. I will see her back for follow-up visit in 2 months for evaluation with repeat CT scan of the chest, abdomen pelvis for restaging of her disease. For the depression, she will continue her current treatment with Prozac. For the history of pulmonary embolism, there is no evidence on the scan of residual pulmonary embolism but she will need to continue her current treatment with Eliquis. For the history of leptomeningeal disease and brain metastasis, the patient is followed by Dr. Mickeal Skinner and she will have repeat MRI of the brain in January 2023. She was advised to call immediately if she has any other concerning symptoms in the interval.  The patient voices understanding of current disease status and treatment options and is in agreement with the current care plan.  All questions were answered. The patient knows to call the clinic with any problems, questions  or concerns. We can certainly  see the patient much sooner if necessary.  Disclaimer: This note was dictated with voice recognition software. Similar sounding words can inadvertently be transcribed and may not be corrected upon review.

## 2021-04-05 ENCOUNTER — Telehealth: Payer: Self-pay | Admitting: Internal Medicine

## 2021-04-05 NOTE — Telephone Encounter (Signed)
Sch per 11/17 pt aware.

## 2021-04-29 DIAGNOSIS — R300 Dysuria: Secondary | ICD-10-CM | POA: Diagnosis not present

## 2021-05-17 ENCOUNTER — Telehealth: Payer: Self-pay | Admitting: Medical Oncology

## 2021-05-17 NOTE — Telephone Encounter (Signed)
Confirmed appts .

## 2021-05-19 ENCOUNTER — Other Ambulatory Visit: Payer: Self-pay

## 2021-05-19 ENCOUNTER — Ambulatory Visit (HOSPITAL_COMMUNITY)
Admission: RE | Admit: 2021-05-19 | Discharge: 2021-05-19 | Disposition: A | Discharge status: Home / Self Care | Payer: Medicare Other | Source: Ambulatory Visit | Attending: Internal Medicine | Admitting: Internal Medicine

## 2021-05-19 ENCOUNTER — Other Ambulatory Visit (HOSPITAL_COMMUNITY): Payer: Self-pay

## 2021-05-19 ENCOUNTER — Encounter: Payer: Self-pay | Admitting: Internal Medicine

## 2021-05-19 ENCOUNTER — Inpatient Hospital Stay: Payer: Medicare Other | Attending: Internal Medicine

## 2021-05-19 DIAGNOSIS — Z86711 Personal history of pulmonary embolism: Secondary | ICD-10-CM | POA: Diagnosis not present

## 2021-05-19 DIAGNOSIS — C7951 Secondary malignant neoplasm of bone: Secondary | ICD-10-CM | POA: Diagnosis not present

## 2021-05-19 DIAGNOSIS — N1831 Chronic kidney disease, stage 3a: Secondary | ICD-10-CM | POA: Diagnosis not present

## 2021-05-19 DIAGNOSIS — J8410 Pulmonary fibrosis, unspecified: Secondary | ICD-10-CM | POA: Diagnosis not present

## 2021-05-19 DIAGNOSIS — Z79899 Other long term (current) drug therapy: Secondary | ICD-10-CM | POA: Diagnosis not present

## 2021-05-19 DIAGNOSIS — C7931 Secondary malignant neoplasm of brain: Secondary | ICD-10-CM | POA: Insufficient documentation

## 2021-05-19 DIAGNOSIS — F32A Depression, unspecified: Secondary | ICD-10-CM | POA: Insufficient documentation

## 2021-05-19 DIAGNOSIS — C3490 Malignant neoplasm of unspecified part of unspecified bronchus or lung: Secondary | ICD-10-CM | POA: Diagnosis not present

## 2021-05-19 DIAGNOSIS — I700 Atherosclerosis of aorta: Secondary | ICD-10-CM | POA: Diagnosis not present

## 2021-05-19 DIAGNOSIS — E039 Hypothyroidism, unspecified: Secondary | ICD-10-CM | POA: Insufficient documentation

## 2021-05-19 DIAGNOSIS — C3432 Malignant neoplasm of lower lobe, left bronchus or lung: Secondary | ICD-10-CM | POA: Diagnosis not present

## 2021-05-19 DIAGNOSIS — Z7901 Long term (current) use of anticoagulants: Secondary | ICD-10-CM | POA: Insufficient documentation

## 2021-05-19 DIAGNOSIS — C7949 Secondary malignant neoplasm of other parts of nervous system: Secondary | ICD-10-CM | POA: Insufficient documentation

## 2021-05-19 DIAGNOSIS — R918 Other nonspecific abnormal finding of lung field: Secondary | ICD-10-CM | POA: Diagnosis not present

## 2021-05-19 DIAGNOSIS — K769 Liver disease, unspecified: Secondary | ICD-10-CM | POA: Diagnosis not present

## 2021-05-19 DIAGNOSIS — I1310 Hypertensive heart and chronic kidney disease without heart failure, with stage 1 through stage 4 chronic kidney disease, or unspecified chronic kidney disease: Secondary | ICD-10-CM | POA: Diagnosis not present

## 2021-05-19 DIAGNOSIS — J9811 Atelectasis: Secondary | ICD-10-CM | POA: Diagnosis not present

## 2021-05-19 DIAGNOSIS — J90 Pleural effusion, not elsewhere classified: Secondary | ICD-10-CM | POA: Diagnosis not present

## 2021-05-19 LAB — CBC WITH DIFFERENTIAL (CANCER CENTER ONLY)
Abs Immature Granulocytes: 0.01 10*3/uL (ref 0.00–0.07)
Basophils Absolute: 0 10*3/uL (ref 0.0–0.1)
Basophils Relative: 0 %
Eosinophils Absolute: 0 10*3/uL (ref 0.0–0.5)
Eosinophils Relative: 1 %
HCT: 34.4 % — ABNORMAL LOW (ref 36.0–46.0)
Hemoglobin: 11.4 g/dL — ABNORMAL LOW (ref 12.0–15.0)
Immature Granulocytes: 0 %
Lymphocytes Relative: 24 %
Lymphs Abs: 1 10*3/uL (ref 0.7–4.0)
MCH: 30.5 pg (ref 26.0–34.0)
MCHC: 33.1 g/dL (ref 30.0–36.0)
MCV: 92 fL (ref 80.0–100.0)
Monocytes Absolute: 0.4 10*3/uL (ref 0.1–1.0)
Monocytes Relative: 10 %
Neutro Abs: 2.9 10*3/uL (ref 1.7–7.7)
Neutrophils Relative %: 65 %
Platelet Count: 112 10*3/uL — ABNORMAL LOW (ref 150–400)
RBC: 3.74 MIL/uL — ABNORMAL LOW (ref 3.87–5.11)
RDW: 14.8 % (ref 11.5–15.5)
WBC Count: 4.4 10*3/uL (ref 4.0–10.5)
nRBC: 0 % (ref 0.0–0.2)

## 2021-05-19 LAB — CMP (CANCER CENTER ONLY)
ALT: 20 U/L (ref 0–44)
AST: 17 U/L (ref 15–41)
Albumin: 4 g/dL (ref 3.5–5.0)
Alkaline Phosphatase: 239 U/L — ABNORMAL HIGH (ref 38–126)
Anion gap: 8 (ref 5–15)
BUN: 20 mg/dL (ref 8–23)
CO2: 27 mmol/L (ref 22–32)
Calcium: 9.2 mg/dL (ref 8.9–10.3)
Chloride: 104 mmol/L (ref 98–111)
Creatinine: 1.05 mg/dL — ABNORMAL HIGH (ref 0.44–1.00)
GFR, Estimated: 56 mL/min — ABNORMAL LOW (ref >60)
Glucose, Bld: 97 mg/dL (ref 70–99)
Potassium: 3.9 mmol/L (ref 3.5–5.1)
Sodium: 139 mmol/L (ref 135–145)
Total Bilirubin: 0.6 mg/dL (ref 0.3–1.2)
Total Protein: 6.7 g/dL (ref 6.5–8.1)

## 2021-05-19 MED ORDER — IOHEXOL 300 MG/ML  SOLN
100.0000 mL | Freq: Once | INTRAMUSCULAR | Status: AC | PRN
  Administered 2021-05-19: 100 mL via INTRAVENOUS

## 2021-05-19 MED ORDER — IOHEXOL 9 MG/ML PO SOLN
500.0000 mL | ORAL | Status: AC
  Administered 2021-05-19: 1000 mL via ORAL

## 2021-05-19 MED ORDER — IOHEXOL 9 MG/ML PO SOLN
ORAL | Status: AC
  Filled 2021-05-19: qty 1000

## 2021-05-19 MED ORDER — SODIUM CHLORIDE (PF) 0.9 % IJ SOLN
INTRAMUSCULAR | Status: AC
  Filled 2021-05-19: qty 50

## 2021-05-24 ENCOUNTER — Inpatient Hospital Stay: Payer: Medicare Other | Admitting: Internal Medicine

## 2021-05-24 ENCOUNTER — Other Ambulatory Visit: Payer: Self-pay

## 2021-05-24 VITALS — BP 134/75 | HR 86 | Temp 97.9°F | Resp 16 | Ht 65.0 in | Wt 133.6 lb

## 2021-05-24 DIAGNOSIS — Z86711 Personal history of pulmonary embolism: Secondary | ICD-10-CM | POA: Diagnosis not present

## 2021-05-24 DIAGNOSIS — Z7901 Long term (current) use of anticoagulants: Secondary | ICD-10-CM | POA: Diagnosis not present

## 2021-05-24 DIAGNOSIS — C7949 Secondary malignant neoplasm of other parts of nervous system: Secondary | ICD-10-CM

## 2021-05-24 DIAGNOSIS — E039 Hypothyroidism, unspecified: Secondary | ICD-10-CM | POA: Diagnosis not present

## 2021-05-24 DIAGNOSIS — F32A Depression, unspecified: Secondary | ICD-10-CM | POA: Diagnosis not present

## 2021-05-24 DIAGNOSIS — C3492 Malignant neoplasm of unspecified part of left bronchus or lung: Secondary | ICD-10-CM

## 2021-05-24 DIAGNOSIS — C7931 Secondary malignant neoplasm of brain: Secondary | ICD-10-CM | POA: Diagnosis not present

## 2021-05-24 DIAGNOSIS — Z5111 Encounter for antineoplastic chemotherapy: Secondary | ICD-10-CM

## 2021-05-24 DIAGNOSIS — Z79899 Other long term (current) drug therapy: Secondary | ICD-10-CM | POA: Diagnosis not present

## 2021-05-24 DIAGNOSIS — C7951 Secondary malignant neoplasm of bone: Secondary | ICD-10-CM | POA: Diagnosis not present

## 2021-05-24 DIAGNOSIS — C3432 Malignant neoplasm of lower lobe, left bronchus or lung: Secondary | ICD-10-CM

## 2021-05-24 DIAGNOSIS — I1310 Hypertensive heart and chronic kidney disease without heart failure, with stage 1 through stage 4 chronic kidney disease, or unspecified chronic kidney disease: Secondary | ICD-10-CM | POA: Diagnosis not present

## 2021-05-24 DIAGNOSIS — N1831 Chronic kidney disease, stage 3a: Secondary | ICD-10-CM | POA: Diagnosis not present

## 2021-05-24 NOTE — Progress Notes (Signed)
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 2) pulmonary embolus in the right main pulmonary artery diagnosed in November 2021.   Biomarker Findings Microsatellite status - MS-Stable Tumor Mutational Burden - 1 Muts/Mb Genomic Findings For a complete list of the genes assayed, please refer to the Appendix. EGFR exon 19 deletion ([id number]) 7 Disease relevant genes with no reportable alterations: ALK, BRAF, ERBB2, KRAS, MET, RET, ROS1  PDL1 expression: 2%.   PRIOR THERAPY: Whole brain radiation and radiotherapy to the osseous metastases under the care of Dr. Lisbeth Renshaw. Last treatment expected on 02/02/20   CURRENT THERAPY:  1) Tagrisso 160 mg p.o. daily. First dose on 02/01/20.  Status post 16 months of treatment. 2) Eliquis 5 mg p.o. twice daily  INTERVAL HISTORY: Stefanie Camacho 76 y.o. female returns to the clinic today for follow-up visit accompanied by her son.  The patient is feeling fine today with no concerning complaints except for few episodes of diarrhea around 3 times a day.  She takes Imodium occasionally.  She denied having any current chest pain, shortness of breath, cough or hemoptysis.  She denied having any fever or chills.  She has no nausea, vomiting, abdominal pain or constipation.  She has no headache or visual changes.  She has no significant weight loss or night sweats.  The patient continues to tolerate her treatment with Tagrisso fairly well except for the diarrhea.  She is here today for evaluation with repeat CT  scan of the chest, abdomen and pelvis for restaging of her disease.  She is expected to have MRI of the brain and follow-up with Dr. Mickeal Skinner next week.   MEDICAL HISTORY: Past Medical History:  Diagnosis Date   Acute CVA (cerebrovascular accident) ([place]) 01/09/2020   Adenocarcinoma of left lung, stage 4 ([place]) 01/18/2020   Anxiety    Blood clots in brain    Chronic kidney disease due to hypertension 05/16/2020   Chronic kidney disease, stage 3a ([place]) 05/16/2020   Chronic respiratory failure ([place]) 05/16/2020   Cough 03/18/2020   Daytime somnolence 05/16/2020   Dizziness 09/25/2019   Dyslipidemia    Encounter for antineoplastic chemotherapy 01/18/2020   Essential hypertension    Gastro-esophageal reflux disease without esophagitis    Generalized weakness 03/18/2020   GERD (gastroesophageal reflux disease)    Goals of care, counseling/discussion 01/18/2020   Hardening of the aorta (main artery of the heart) ([place]) 09/08/2020   Hyperlipidemia 05/16/2020   Hypothyroidism    Hypothyroidism, unspecified    Insomnia 05/16/2020   Intracranial bleeding ([place]) 01/09/2020   Left bundle branch block 06/04/2018   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  MEDICATIONS:  Current Outpatient Medications  Medication Sig Dispense Refill   apixaban (ELIQUIS) 5 MG TABS tablet Take 1 tablet (5 mg total) by mouth 2 (two) times daily. 90 tablet 1   atorvastatin (LIPITOR) 10 MG tablet Take 1 tablet (10 mg total) by mouth daily at 6 PM. 90 tablet 3   Ensure Plus (ENSURE PLUS) LIQD Take 237 mLs by mouth as needed (For Nutritional Supplemntation. Unknow strength or amount). (Patient not taking: Reported on 01/26/2021)     FLUoxetine (PROZAC) 20 MG capsule Take 20 mg by mouth daily.     levETIRAcetam (KEPPRA) 250 MG tablet Take 250 mg by mouth 2 (two) times daily.     levothyroxine (SYNTHROID) 75 MCG tablet Take 1 tablet (75 mcg total) by mouth daily. 30 tablet 1   loperamide (IMODIUM) 2 MG capsule Take 2 mg by mouth as needed for diarrhea or loose stools.     losartan (COZAAR) 50 MG tablet Take 1 tablet (50 mg total) by mouth daily. 90 tablet 1   metoprolol succinate (TOPROL-XL) 25 MG 24 hr tablet Take 25 mg by mouth daily.     osimertinib mesylate (TAGRISSO) 80 MG tablet Take 2 tablets (160 mg total) by mouth daily. 60 tablet 3   pantoprazole (PROTONIX) 40 MG tablet Take 1 tablet by mouth once daily (Patient taking differently: Take 40 mg by mouth every evening.) 30 tablet 0   Polyethyl Glycol-Propyl Glycol (SYSTANE OP) Apply 1 drop to eye as needed (Eye dryness/ Unknown strength).     prochlorperazine (COMPAZINE) 10 MG tablet Take 1 tablet (10 mg total) by mouth every 6 (six) hours as needed for nausea or vomiting. 45 tablet 5   spironolactone (ALDACTONE) 25 MG tablet Take 25 mg by mouth daily.     triamcinolone cream (KENALOG) 0.1 % Apply 1 application topically daily.     No current facility-administered medications  for this visit.    SURGICAL HISTORY:  Past Surgical History:  Procedure Laterality Date   ABDOMINAL HYSTERECTOMY     BLADDER NECK RECONSTRUCTION     BRONCHIAL BIOPSY  01/12/2020   Procedure: BRONCHIAL BIOPSIES;  Surgeon: Garner Nash, DO;  Location: [facility];  Service: Pulmonary;;   BRONCHIAL BRUSHINGS  01/12/2020   Procedure: BRONCHIAL BRUSHINGS;  Surgeon: Garner Nash, DO;  Location: [facility];  Service: Pulmonary;;   BRONCHIAL NEEDLE ASPIRATION BIOPSY  01/12/2020   Procedure: BRONCHIAL NEEDLE ASPIRATION BIOPSIES;  Surgeon: Garner Nash, DO;  Location: [facility];  Service: Pulmonary;;   BRONCHIAL WASHINGS  01/12/2020   Procedure: BRONCHIAL WASHINGS;  Surgeon: Garner Nash, DO;  Location: [facility];  Service: Pulmonary;;   SHOULDER SURGERY     TONSILLECTOMY     VIDEO BRONCHOSCOPY WITH ENDOBRONCHIAL ULTRASOUND  01/12/2020   Procedure: VIDEO BRONCHOSCOPY WITH ENDOBRONCHIAL ULTRASOUND;  Surgeon: Garner Nash, DO;  Location: [facility];  Service: Pulmonary;;    REVIEW OF SYSTEMS:  Constitutional: positive for fatigue Eyes: negative Ears,  nose, mouth, throat, and face: negative Respiratory: positive for dyspnea on exertion Cardiovascular: negative Gastrointestinal: positive for diarrhea Genitourinary:negative Integument/breast: negative Hematologic/lymphatic: negative Musculoskeletal:positive for muscle weakness Neurological: negative Behavioral/Psych: negative Endocrine: negative Allergic/Immunologic: negative   PHYSICAL EXAMINATION: General appearance: alert, cooperative, fatigued, and no distress Head: Normocephalic, without obvious abnormality, atraumatic Neck: no adenopathy, no JVD, supple, symmetrical, trachea midline, and thyroid not enlarged, symmetric, no tenderness/mass/nodules Lymph nodes: Cervical, supraclavicular, and axillary nodes normal. Resp: clear to auscultation bilaterally Back: symmetric, no curvature. ROM normal. No CVA  tenderness. Cardio: regular rate and rhythm, S1, S2 normal, no murmur, click, rub or gallop GI: soft, non-tender; bowel sounds normal; no masses,  no organomegaly Extremities: extremities normal, atraumatic, no cyanosis or edema Neurologic: Alert and oriented X 3, normal strength and tone. Normal symmetric reflexes. Normal coordination and gait  ECOG PERFORMANCE STATUS: 1 - Symptomatic but completely ambulatory  Blood pressure 134/75, pulse 86, temperature 97.9 F (36.6 C), temperature source Axillary, resp. rate 16, height 5' 5" (1.651 m), weight 133 lb 9.6 oz (60.6 kg), SpO2 99 %.  LABORATORY DATA: Lab Results  Component Value Date   WBC 4.4 05/19/2021   HGB 11.4 (L) 05/19/2021   HCT 34.4 (L) 05/19/2021   MCV 92.0 05/19/2021   PLT 112 (L) 05/19/2021      Chemistry      Component Value Date/Time   NA 139 05/19/2021 1455   K 3.9 05/19/2021 1455   CL 104 05/19/2021 1455   CO2 27 05/19/2021 1455   BUN 20 05/19/2021 1455   CREATININE 1.05 (H) 05/19/2021 1455      Component Value Date/Time   CALCIUM 9.2 05/19/2021 1455   ALKPHOS 239 (H) 05/19/2021 1455   AST 17 05/19/2021 1455   ALT 20 05/19/2021 1455   BILITOT 0.6 05/19/2021 1455       RADIOGRAPHIC STUDIES: CT Chest W Contrast  Result Date: 05/21/2021 CLINICAL DATA:  Metastatic lung cancer restaging EXAM: CT CHEST, ABDOMEN, AND PELVIS WITH CONTRAST TECHNIQUE: Multidetector CT imaging of the chest, abdomen and pelvis was performed following the standard protocol during bolus administration of intravenous contrast. RADIATION DOSE REDUCTION: This exam was performed according to the departmental dose-optimization program which includes automated exposure control, adjustment of the mA and/or kV according to patient size and/or use of iterative reconstruction technique. CONTRAST:  15m OMNIPAQUE IOHEXOL 300 MG/ML SOLN additional oral enteric contrast COMPARISON:  03/13/2021 FINDINGS: CT CHEST FINDINGS Cardiovascular: No  significant vascular findings. Cardiomegaly. No pericardial effusion. Mediastinum/Nodes: No enlarged mediastinal, hilar, or axillary lymph nodes. Thyroid gland, trachea, and esophagus demonstrate no significant findings. Lungs/Pleura: Unchanged moderate, loculated left pleural effusion and associated atelectasis and fibrotic consolidation. Numerous bilateral solid and subsolid pulmonary nodules are slightly increased in size compared to prior examination, index nodule of the anterior right lower lobe measuring 0.9 cm, previously 0.7 cm, and with increasingly solid character (series 4, image 87). Index solid nodule of the central right upper lobe measures 1.1 x 0.8 cm, previously 0.8 x 0.6 cm (series 4, image 55). Background of scattered ground-glass opacities throughout the lungs is unchanged and remains nonspecific. Musculoskeletal: No chest wall mass. CT ABDOMEN PELVIS FINDINGS Hepatobiliary: Unchanged hyperenhancing lesions of the right lobe of the liver, particularly a subcapsular hemangioma of the peripheral posterior right lobe of the liver, hepatic segment VI (series 2, image 54) and a probable flash filling hemangioma adjacent to the gallbladder fossa, hepatic segment V (series 2, image 63). No gallstones, gallbladder wall thickening, or biliary dilatation. Pancreas:  Unremarkable. No pancreatic ductal dilatation or surrounding inflammatory changes. Spleen: Normal in size without significant abnormality. Adrenals/Urinary Tract: Adrenal glands are unremarkable. Kidneys are normal, without renal calculi, solid lesion, or hydronephrosis. Bladder is unremarkable. Stomach/Bowel: Stomach is within normal limits. Appendix appears normal although unusually situated in the left hemiabdomen due to location of the cecum (series 2, image 69). No evidence of bowel wall thickening, distention, or inflammatory changes. Vascular/Lymphatic: Aortic atherosclerosis. No enlarged abdominal or pelvic lymph nodes. Reproductive:  Status post hysterectomy. Other: No abdominal wall hernia or abnormality. No ascites. Musculoskeletal: No acute osseous findings. Redemonstrated, widespread sclerotic osseous metastatic disease, size and density of sclerotic lesions slightly increased compared to prior examination. IMPRESSION: 1. Numerous bilateral solid and subsolid pulmonary nodules are slightly increased in size and solid character compared to prior examination, consistent with slightly worsened pulmonary metastatic disease. 2. Background of scattered ground-glass opacities throughout the lungs is unchanged and these remain nonspecific. 3. Unchanged moderate, loculated left pleural effusion and associated post treatment atelectasis and fibrotic consolidation. 4. Redemonstrated, widespread sclerotic osseous metastatic disease, size and density of sclerotic lesions slightly increased compared to prior examination. 5. Unchanged hyperenhancing lesions of the right lobe of the liver, benign hemangiomata strongly favored given appearance. Attention on follow-up. Aortic Atherosclerosis (ICD10-I70.0). Electronically Signed   By: Delanna Ahmadi M.D.   On: 05/21/2021 11:17   CT Abdomen Pelvis W Contrast  Result Date: 05/21/2021 CLINICAL DATA:  Metastatic lung cancer restaging EXAM: CT CHEST, ABDOMEN, AND PELVIS WITH CONTRAST TECHNIQUE: Multidetector CT imaging of the chest, abdomen and pelvis was performed following the standard protocol during bolus administration of intravenous contrast. RADIATION DOSE REDUCTION: This exam was performed according to the departmental dose-optimization program which includes automated exposure control, adjustment of the mA and/or kV according to patient size and/or use of iterative reconstruction technique. CONTRAST:  140m OMNIPAQUE IOHEXOL 300 MG/ML SOLN additional oral enteric contrast COMPARISON:  03/13/2021 FINDINGS: CT CHEST FINDINGS Cardiovascular: No significant vascular findings. Cardiomegaly. No pericardial  effusion. Mediastinum/Nodes: No enlarged mediastinal, hilar, or axillary lymph nodes. Thyroid gland, trachea, and esophagus demonstrate no significant findings. Lungs/Pleura: Unchanged moderate, loculated left pleural effusion and associated atelectasis and fibrotic consolidation. Numerous bilateral solid and subsolid pulmonary nodules are slightly increased in size compared to prior examination, index nodule of the anterior right lower lobe measuring 0.9 cm, previously 0.7 cm, and with increasingly solid character (series 4, image 87). Index solid nodule of the central right upper lobe measures 1.1 x 0.8 cm, previously 0.8 x 0.6 cm (series 4, image 55). Background of scattered ground-glass opacities throughout the lungs is unchanged and remains nonspecific. Musculoskeletal: No chest wall mass. CT ABDOMEN PELVIS FINDINGS Hepatobiliary: Unchanged hyperenhancing lesions of the right lobe of the liver, particularly a subcapsular hemangioma of the peripheral posterior right lobe of the liver, hepatic segment VI (series 2, image 54) and a probable flash filling hemangioma adjacent to the gallbladder fossa, hepatic segment V (series 2, image 63). No gallstones, gallbladder wall thickening, or biliary dilatation. Pancreas: Unremarkable. No pancreatic ductal dilatation or surrounding inflammatory changes. Spleen: Normal in size without significant abnormality. Adrenals/Urinary Tract: Adrenal glands are unremarkable. Kidneys are normal, without renal calculi, solid lesion, or hydronephrosis. Bladder is unremarkable. Stomach/Bowel: Stomach is within normal limits. Appendix appears normal although unusually situated in the left hemiabdomen due to location of the cecum (series 2, image 69). No evidence of bowel wall thickening, distention, or inflammatory changes. Vascular/Lymphatic: Aortic atherosclerosis. No enlarged abdominal or pelvic lymph nodes. Reproductive: Status post  hysterectomy. Other: No abdominal wall hernia or  abnormality. No ascites. Musculoskeletal: No acute osseous findings. Redemonstrated, widespread sclerotic osseous metastatic disease, size and density of sclerotic lesions slightly increased compared to prior examination. IMPRESSION: 1. Numerous bilateral solid and subsolid pulmonary nodules are slightly increased in size and solid character compared to prior examination, consistent with slightly worsened pulmonary metastatic disease. 2. Background of scattered ground-glass opacities throughout the lungs is unchanged and these remain nonspecific. 3. Unchanged moderate, loculated left pleural effusion and associated post treatment atelectasis and fibrotic consolidation. 4. Redemonstrated, widespread sclerotic osseous metastatic disease, size and density of sclerotic lesions slightly increased compared to prior examination. 5. Unchanged hyperenhancing lesions of the right lobe of the liver, benign hemangiomata strongly favored given appearance. Attention on follow-up. Aortic Atherosclerosis (ICD10-I70.0). Electronically Signed   By: Delanna Ahmadi M.D.   On: 05/21/2021 11:17     ASSESSMENT AND PLAN: This is a very pleasant 76 years old white female recently diagnosed with a stage IV non-small cell lung cancer, adenocarcinoma in September 2021 and presented with left lower lobe lung mass in addition to extensive bilateral pulmonary metastasis and anterior mediastinal lymphadenopathy as well as a skeletal metastasis and metastatic disease to the brain with leptomeningeal disease.  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 She underwent whole brain irradiation and currently undergoing treatment with targeted therapy with Tagrisso 160 mg p.o. daily because of the leptomeningeal disease status post 16 months of treatment.  The patient continues to tolerate this treatment with Tagrisso fairly well except for the few episodes of diarrhea. She had repeat CT  scan of the chest, abdomen pelvis performed recently.  I personally and independently reviewed the scan images and discussed the results with the patient and her son today.  Her scan showed slight increase in the size and solid component of some of the pulmonary nodules but not significant at this point to change her treatment. I recommended for the patient to continue her current treatment with Tagrisso with the same dose. I will see her back for follow-up visit in 6 weeks for evaluation and repeat blood work. For the history of leptomeningeal disease and brain metastasis, she is scheduled to have MRI of the brain on May 26, 2021 followed by follow-up visit with Dr. Mickeal Skinner. For the diarrhea I advised the patient to take Imodium after every episode of the diarrhea up to 8 tablets a day. For the history of pulmonary embolism, she will continue her current treatment with Eliquis. The patient was advised to call immediately if she has any other concerning symptoms in the interval. For the depression, she will continue her current treatment with Prozac.  The patient voices understanding of current disease status and treatment options and is in agreement with the current care plan.  All questions were answered. The patient knows to call the clinic with any problems, questions or concerns. We can certainly see the patient much sooner if necessary.  Disclaimer: This note was dictated with voice recognition software. Similar sounding words can inadvertently be transcribed and may not be corrected upon review.

## 2021-05-26 ENCOUNTER — Other Ambulatory Visit: Payer: Self-pay

## 2021-05-26 ENCOUNTER — Ambulatory Visit
Admission: RE | Admit: 2021-05-26 | Discharge: 2021-05-26 | Disposition: A | Discharge status: Home / Self Care | Payer: Medicare Other | Source: Ambulatory Visit | Attending: Internal Medicine | Admitting: Internal Medicine

## 2021-05-26 DIAGNOSIS — C3492 Malignant neoplasm of unspecified part of left bronchus or lung: Secondary | ICD-10-CM

## 2021-05-26 DIAGNOSIS — C3490 Malignant neoplasm of unspecified part of unspecified bronchus or lung: Secondary | ICD-10-CM | POA: Diagnosis not present

## 2021-05-26 DIAGNOSIS — C7949 Secondary malignant neoplasm of other parts of nervous system: Secondary | ICD-10-CM

## 2021-05-26 MED ORDER — OSIMERTINIB MESYLATE 80 MG PO TABS: 160.0000 mg | ORAL_TABLET | Freq: Every day | ORAL | 3 refills | Status: DC

## 2021-05-26 MED ORDER — GADOBENATE DIMEGLUMINE 529 MG/ML IV SOLN
12.0000 mL | Freq: Once | INTRAVENOUS | Status: AC | PRN
  Administered 2021-05-26: 12 mL via INTRAVENOUS

## 2021-05-30 ENCOUNTER — Other Ambulatory Visit: Payer: Self-pay

## 2021-05-30 ENCOUNTER — Inpatient Hospital Stay: Payer: Medicare Other | Admitting: Internal Medicine

## 2021-05-30 VITALS — BP 117/63 | HR 70 | Temp 97.5°F | Resp 17 | Wt 134.7 lb

## 2021-05-30 DIAGNOSIS — Z79899 Other long term (current) drug therapy: Secondary | ICD-10-CM | POA: Diagnosis not present

## 2021-05-30 DIAGNOSIS — Z7901 Long term (current) use of anticoagulants: Secondary | ICD-10-CM | POA: Diagnosis not present

## 2021-05-30 DIAGNOSIS — C7951 Secondary malignant neoplasm of bone: Secondary | ICD-10-CM | POA: Diagnosis not present

## 2021-05-30 DIAGNOSIS — C3432 Malignant neoplasm of lower lobe, left bronchus or lung: Secondary | ICD-10-CM | POA: Diagnosis not present

## 2021-05-30 DIAGNOSIS — N1831 Chronic kidney disease, stage 3a: Secondary | ICD-10-CM | POA: Diagnosis not present

## 2021-05-30 DIAGNOSIS — I1310 Hypertensive heart and chronic kidney disease without heart failure, with stage 1 through stage 4 chronic kidney disease, or unspecified chronic kidney disease: Secondary | ICD-10-CM | POA: Diagnosis not present

## 2021-05-30 DIAGNOSIS — F32A Depression, unspecified: Secondary | ICD-10-CM | POA: Diagnosis not present

## 2021-05-30 DIAGNOSIS — C7949 Secondary malignant neoplasm of other parts of nervous system: Secondary | ICD-10-CM | POA: Diagnosis not present

## 2021-05-30 DIAGNOSIS — C7931 Secondary malignant neoplasm of brain: Secondary | ICD-10-CM | POA: Diagnosis not present

## 2021-05-30 DIAGNOSIS — Z86711 Personal history of pulmonary embolism: Secondary | ICD-10-CM | POA: Diagnosis not present

## 2021-05-30 DIAGNOSIS — E039 Hypothyroidism, unspecified: Secondary | ICD-10-CM | POA: Diagnosis not present

## 2021-05-30 NOTE — Progress Notes (Signed)
[facility] [place] [place], [place] [address] [phone]   Interval Evaluation  Date of Service: 05/30/21 Patient Name: Stefanie Camacho Patient MRN: [id number] Patient DOB: 1945-07-29 Provider: Ventura Sellers, MD  Identifying Statement:  Stefanie Camacho is a 76 y.o. 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, leptomeningeal metastases   Primary Cancer:  Oncologic History: Oncology History  Malignant neoplasm of lower lobe, left bronchus or lung ([place])  01/14/2020 Initial Diagnosis   Malignant neoplasm of lower lobe, left bronchus or lung (Stefanie Camacho)   07/26/2020 Cancer Staging   Staging form: Lung, AJCC 8th Edition - Clinical: Stage IVB (cT3, cN2, cM1c) - Signed by Curt Bears, MD on 07/26/2020    Adenocarcinoma of left lung, stage 4 ([place])  01/18/2020 Initial Diagnosis   Adenocarcinoma of left lung, stage 4 ([place])   01/28/2020 -  Chemotherapy   The patient had [No matching medication found in this treatment plan]  for chemotherapy treatment.     05/25/2020 Cancer Staging   Staging form: Lung, AJCC 8th Edition - Clinical: Stage IVB (cT3, cN2, cM1c) - Signed by Curt Bears, MD on 05/25/2020     CNS Oncologic History 02/02/20: Completes WBRT with Dr. Lisbeth Renshaw  Interval History:  Kassandra Meriweather presents today for follow up after recent MRI brain.  No clinical changes today, continues to experience significant fatigue, issues with short term memory.  Vertigo is rare, doesn't need Antivert because episodes are generally quite brief.  No further UTI symptoms.  Continues to take Keppra 231m BID, seizure free. Still on THendrickswith Dr. MJulien Nordmann struggles with diarrhea.    H+P (01/29/20) Patient presented to medical attention on 01/09/20 with sudden onset "marching" left sided facial numbness.  The episode drew suspicion for stroke, and during workup patient was found to have innumerable brain metastases with additional foci of leptomeningeal enhancement  secondary to widely metastatic lung cancer.  She returned to baseline and was discharged with [place] brain disease has since been treated with whole brain radiotherapy with Dr. MLisbeth Renshaw she is scheduled to complete treatment next week.  She has difficulty with ambulation because of a hip metastasis, but since being treated with RT that pain has improved as has her walking.  Plans to meet with Dr. MJulien Nordmannnext week to discuss plans for therapeutic targeting of EGFR mutation.    Medications: Current Outpatient Medications on File Prior to Visit  Medication Sig Dispense Refill   apixaban (ELIQUIS) 5 MG TABS tablet Take 1 tablet (5 mg total) by mouth 2 (two) times daily. 90 tablet 1   FLUoxetine (PROZAC) 20 MG capsule Take 20 mg by mouth daily.     levETIRAcetam (KEPPRA) 250 MG tablet Take 250 mg by mouth 2 (two) times daily.     levothyroxine (SYNTHROID) 75 MCG tablet Take 1 tablet (75 mcg total) by mouth daily. 30 tablet 1   loperamide (IMODIUM) 2 MG capsule Take 2 mg by mouth as needed for diarrhea or loose stools.     metoprolol succinate (TOPROL-XL) 25 MG 24 hr tablet Take 25 mg by mouth daily.     osimertinib mesylate (TAGRISSO) 80 MG tablet Take 2 tablets (160 mg total) by mouth daily. 60 tablet 3   pantoprazole (PROTONIX) 40 MG tablet Take 1 tablet by mouth once daily (Patient taking differently: Take 40 mg by mouth every evening.) 30 tablet 0   Polyethyl Glycol-Propyl Glycol (SYSTANE OP) Apply 1 drop to eye as needed (Eye dryness/ Unknown strength).  spironolactone (ALDACTONE) 25 MG tablet Take 25 mg by mouth daily.     triamcinolone cream (KENALOG) 0.1 % Apply 1 application topically daily.     losartan (COZAAR) 50 MG tablet Take 1 tablet (50 mg total) by mouth daily. 90 tablet 1   prochlorperazine (COMPAZINE) 10 MG tablet Take 1 tablet (10 mg total) by mouth every 6 (six) hours as needed for nausea or vomiting. (Patient not taking: Reported on 05/30/2021) 45 tablet 5   No current  facility-administered medications on file prior to visit.    Allergies:  Allergies  Allergen Reactions   Sertraline Hcl Other (See Comments)    Severe diarrhea   Past Medical History:  Past Medical History:  Diagnosis Date   Acute CVA (cerebrovascular accident) ([place]) 01/09/2020   Adenocarcinoma of left lung, stage 4 ([place]) 01/18/2020   Anxiety    Blood clots in brain    Chronic kidney disease due to hypertension 05/16/2020   Chronic kidney disease, stage 3a ([place]) 05/16/2020   Chronic respiratory failure ([place]) 05/16/2020   Cough 03/18/2020   Daytime somnolence 05/16/2020   Dizziness 09/25/2019   Dyslipidemia    Encounter for antineoplastic chemotherapy 01/18/2020   Essential hypertension    Gastro-esophageal reflux disease without esophagitis    Generalized weakness 03/18/2020   GERD (gastroesophageal reflux disease)    Goals of care, counseling/discussion 01/18/2020   Hardening of the aorta (main artery of the heart) ([place]) 09/08/2020   Hyperlipidemia 05/16/2020   Hypothyroidism    Hypothyroidism, unspecified    Insomnia 05/16/2020   Intracranial bleeding ([place]) 01/09/2020   Left bundle branch block 06/04/2018   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    Single subsegmental pulmonary embolism without acute cor pulmonale ([place]) 03/25/2020   Sleep apnea    Stress incontinence (female) (female) 05/16/2020   TIA (transient ischemic attack) 01/08/2020   Vertigo 09/25/2019   Past Surgical History:  Past Surgical History:  Procedure Laterality Date   ABDOMINAL HYSTERECTOMY     BLADDER NECK RECONSTRUCTION     BRONCHIAL BIOPSY  01/12/2020   Procedure: BRONCHIAL BIOPSIES;  Surgeon: Garner Nash, DO;  Location: [facility];  Service: Pulmonary;;   BRONCHIAL BRUSHINGS  01/12/2020   Procedure: BRONCHIAL BRUSHINGS;  Surgeon: Garner Nash, DO;  Location: [facility];  Service: Pulmonary;;   BRONCHIAL NEEDLE ASPIRATION BIOPSY  01/12/2020   Procedure: BRONCHIAL NEEDLE ASPIRATION BIOPSIES;  Surgeon: Garner Nash, DO;  Location: [facility];  Service: Pulmonary;;   BRONCHIAL WASHINGS  01/12/2020   Procedure: BRONCHIAL WASHINGS;  Surgeon: Garner Nash, DO;  Location: [facility];  Service: Pulmonary;;   SHOULDER SURGERY     TONSILLECTOMY     VIDEO BRONCHOSCOPY WITH ENDOBRONCHIAL ULTRASOUND  01/12/2020   Procedure: VIDEO BRONCHOSCOPY WITH ENDOBRONCHIAL ULTRASOUND;  Surgeon: June Leap  L, DO;  Location: [facility];  Service: Pulmonary;;   Social History:  Social History   Socioeconomic History   Marital status: Divorced    Spouse name: Not on file   Number of children: Not on file   Years of education: Not on file   Highest education level: Not on file  Occupational History   Not on file  Tobacco Use   Smoking status: Never   Smokeless tobacco: Never  Vaping Use   Vaping Use: Never used  Substance and Sexual Activity   Alcohol use: Not Currently   Drug use: Never   Sexual activity: Not on file  Other Topics Concern   Not on file  Social History Narrative   Not on file   Social Determinants of Health   Financial Resource Strain: Not on file  Food Insecurity: Not on file  Transportation Needs: Not on file  Physical Activity:  Not on file  Stress: Not on file  Social Connections: Not on file  Intimate Partner Violence: Not on file   Family History:  Family History  Problem Relation Age of Onset   Heart disease Mother    Stroke Mother    Heart disease Father    Stroke Father    Heart disease Maternal Grandfather    Heart disease Paternal Grandfather     Review of Systems: Constitutional: Doesn't report fevers, chills or abnormal weight loss Eyes: Doesn't report blurriness of vision Ears, nose, mouth, throat, and face: Doesn't report sore throat Respiratory: Doesn't report cough, dyspnea or wheezes Cardiovascular: Doesn't report palpitation, chest discomfort  Gastrointestinal:  Doesn't report nausea, constipation, diarrhea GU: denies dysuria Skin: Doesn't report skin rashes Neurological: Per HPI Musculoskeletal: Doesn't report joint pain Behavioral/Psych: Doesn't report anxiety  Physical Exam: Vitals:   05/30/21 0856  BP: 117/63  Pulse: 70  Resp: 17  Temp: (!) 97.5 F (36.4 C)  SpO2: 100%    KPS: 80. General: Alert, cooperative, pleasant, in no acute distress Head: Normal EENT: No conjunctival injection or scleral icterus.  Lungs: Resp effort normal Cardiac: Regular rate Abdomen: Non-distended abdomen Skin: No rashes cyanosis or petechiae. Extremities: No clubbing or edema  Neurologic Exam: Mental Status: Awake, alert, attentive to examiner. Oriented to self and environment. Language is fluent with intact comprehension.  Cranial Nerves: Visual acuity is grossly normal. Visual fields are full. Extra-ocular movements intact. No ptosis. Face is symmetric Motor: Tone and bulk are normal. Power is full in both arms and legs. Reflexes are symmetric, no pathologic reflexes present.  Sensory: Intact to light touch Gait: Orthopedic limitation  Labs: I have reviewed the data as listed    Component Value Date/Time   NA 139 05/19/2021 1455   K 3.9 05/19/2021 1455   CL 104 05/19/2021 1455    CO2 27 05/19/2021 1455   GLUCOSE 97 05/19/2021 1455   BUN 20 05/19/2021 1455   CREATININE 1.05 (H) 05/19/2021 1455   CALCIUM 9.2 05/19/2021 1455   PROT 6.7 05/19/2021 1455   PROT 6.9 07/17/2018 0934   ALBUMIN 4.0 05/19/2021 1455   ALBUMIN 4.1 07/17/2018 0934   AST 17 05/19/2021 1455   ALT 20 05/19/2021 1455   ALKPHOS 239 (H) 05/19/2021 1455   BILITOT 0.6 05/19/2021 1455   GFRNONAA 56 (L) 05/19/2021 1455   GFRAA >60 02/01/2020 1419   Lab Results  Component Value Date   WBC 4.4 05/19/2021   NEUTROABS 2.9 05/19/2021   HGB 11.4 (L) 05/19/2021   HCT 34.4 (L) 05/19/2021  MCV 92.0 05/19/2021   PLT 112 (L) 05/19/2021   Imaging:  [facility] Clinician Interpretation: I have personally reviewed the CNS images as listed.  My interpretation, in the context of the patient's clinical presentation, is stable disease  CT Chest W Contrast  Result Date: 05/21/2021 CLINICAL DATA:  Metastatic lung cancer restaging EXAM: CT CHEST, ABDOMEN, AND PELVIS WITH CONTRAST TECHNIQUE: Multidetector CT imaging of the chest, abdomen and pelvis was performed following the standard protocol during bolus administration of intravenous contrast. RADIATION DOSE REDUCTION: This exam was performed according to the departmental dose-optimization program which includes automated exposure control, adjustment of the mA and/or kV according to patient size and/or use of iterative reconstruction technique. CONTRAST:  125m OMNIPAQUE IOHEXOL 300 MG/ML SOLN additional oral enteric contrast COMPARISON:  03/13/2021 FINDINGS: CT CHEST FINDINGS Cardiovascular: No significant vascular findings. Cardiomegaly. No pericardial effusion. Mediastinum/Nodes: No enlarged mediastinal, hilar, or axillary lymph nodes. Thyroid gland, trachea, and esophagus demonstrate no significant findings. Lungs/Pleura: Unchanged moderate, loculated left pleural effusion and associated atelectasis and fibrotic consolidation. Numerous bilateral solid and subsolid  pulmonary nodules are slightly increased in size compared to prior examination, index nodule of the anterior right lower lobe measuring 0.9 cm, previously 0.7 cm, and with increasingly solid character (series 4, image 87). Index solid nodule of the central right upper lobe measures 1.1 x 0.8 cm, previously 0.8 x 0.6 cm (series 4, image 55). Background of scattered ground-glass opacities throughout the lungs is unchanged and remains nonspecific. Musculoskeletal: No chest wall mass. CT ABDOMEN PELVIS FINDINGS Hepatobiliary: Unchanged hyperenhancing lesions of the right lobe of the liver, particularly a subcapsular hemangioma of the peripheral posterior right lobe of the liver, hepatic segment VI (series 2, image 54) and a probable flash filling hemangioma adjacent to the gallbladder fossa, hepatic segment V (series 2, image 63). No gallstones, gallbladder wall thickening, or biliary dilatation. Pancreas: Unremarkable. No pancreatic ductal dilatation or surrounding inflammatory changes. Spleen: Normal in size without significant abnormality. Adrenals/Urinary Tract: Adrenal glands are unremarkable. Kidneys are normal, without renal calculi, solid lesion, or hydronephrosis. Bladder is unremarkable. Stomach/Bowel: Stomach is within normal limits. Appendix appears normal although unusually situated in the left hemiabdomen due to location of the cecum (series 2, image 69). No evidence of bowel wall thickening, distention, or inflammatory changes. Vascular/Lymphatic: Aortic atherosclerosis. No enlarged abdominal or pelvic lymph nodes. Reproductive: Status post hysterectomy. Other: No abdominal wall hernia or abnormality. No ascites. Musculoskeletal: No acute osseous findings. Redemonstrated, widespread sclerotic osseous metastatic disease, size and density of sclerotic lesions slightly increased compared to prior examination. IMPRESSION: 1. Numerous bilateral solid and subsolid pulmonary nodules are slightly increased in  size and solid character compared to prior examination, consistent with slightly worsened pulmonary metastatic disease. 2. Background of scattered ground-glass opacities throughout the lungs is unchanged and these remain nonspecific. 3. Unchanged moderate, loculated left pleural effusion and associated post treatment atelectasis and fibrotic consolidation. 4. Redemonstrated, widespread sclerotic osseous metastatic disease, size and density of sclerotic lesions slightly increased compared to prior examination. 5. Unchanged hyperenhancing lesions of the right lobe of the liver, benign hemangiomata strongly favored given appearance. Attention on follow-up. Aortic Atherosclerosis (ICD10-I70.0). Electronically Signed   By: ADelanna AhmadiM.D.   On: 05/21/2021 11:17   MR BRAIN W WO CONTRAST  Result Date: 05/27/2021 CLINICAL DATA:  Assess treatment response. Lung cancer with history radiation therapy. EXAM: MRI HEAD WITHOUT AND WITH CONTRAST TECHNIQUE: Multiplanar, multiecho pulse sequences of the brain and surrounding structures were obtained without and with intravenous  contrast. CONTRAST:  76m MULTIHANCE GADOBENATE DIMEGLUMINE 529 MG/ML IV SOLN COMPARISON:  01/25/2021 FINDINGS: BRAIN New Lesions: None. Larger lesions: None. Stable or Smaller lesions: Three nodular lesions are stable from prior, all measuring less than 1 cm and seen on series 11 images 58, 53, and 75. Minimal sulcal enhancement along the left central sulcus is also non progressed. Other Brain findings: No incidental infarct, hemorrhage, hydrocephalus, or mass Vascular: Major vessels are enhancing Skull and upper cervical spine: Normal marrow signal. Sinuses/Orbits: Bilateral cataract resection IMPRESSION: No new or progressive metastatic disease. Electronically Signed   By: JJorje GuildM.D.   On: 05/27/2021 10:40   CT Abdomen Pelvis W Contrast  Result Date: 05/21/2021 CLINICAL DATA:  Metastatic lung cancer restaging EXAM: CT CHEST, ABDOMEN,  AND PELVIS WITH CONTRAST TECHNIQUE: Multidetector CT imaging of the chest, abdomen and pelvis was performed following the standard protocol during bolus administration of intravenous contrast. RADIATION DOSE REDUCTION: This exam was performed according to the departmental dose-optimization program which includes automated exposure control, adjustment of the mA and/or kV according to patient size and/or use of iterative reconstruction technique. CONTRAST:  1037mOMNIPAQUE IOHEXOL 300 MG/ML SOLN additional oral enteric contrast COMPARISON:  03/13/2021 FINDINGS: CT CHEST FINDINGS Cardiovascular: No significant vascular findings. Cardiomegaly. No pericardial effusion. Mediastinum/Nodes: No enlarged mediastinal, hilar, or axillary lymph nodes. Thyroid gland, trachea, and esophagus demonstrate no significant findings. Lungs/Pleura: Unchanged moderate, loculated left pleural effusion and associated atelectasis and fibrotic consolidation. Numerous bilateral solid and subsolid pulmonary nodules are slightly increased in size compared to prior examination, index nodule of the anterior right lower lobe measuring 0.9 cm, previously 0.7 cm, and with increasingly solid character (series 4, image 87). Index solid nodule of the central right upper lobe measures 1.1 x 0.8 cm, previously 0.8 x 0.6 cm (series 4, image 55). Background of scattered ground-glass opacities throughout the lungs is unchanged and remains nonspecific. Musculoskeletal: No chest wall mass. CT ABDOMEN PELVIS FINDINGS Hepatobiliary: Unchanged hyperenhancing lesions of the right lobe of the liver, particularly a subcapsular hemangioma of the peripheral posterior right lobe of the liver, hepatic segment VI (series 2, image 54) and a probable flash filling hemangioma adjacent to the gallbladder fossa, hepatic segment V (series 2, image 63). No gallstones, gallbladder wall thickening, or biliary dilatation. Pancreas: Unremarkable. No pancreatic ductal dilatation or  surrounding inflammatory changes. Spleen: Normal in size without significant abnormality. Adrenals/Urinary Tract: Adrenal glands are unremarkable. Kidneys are normal, without renal calculi, solid lesion, or hydronephrosis. Bladder is unremarkable. Stomach/Bowel: Stomach is within normal limits. Appendix appears normal although unusually situated in the left hemiabdomen due to location of the cecum (series 2, image 69). No evidence of bowel wall thickening, distention, or inflammatory changes. Vascular/Lymphatic: Aortic atherosclerosis. No enlarged abdominal or pelvic lymph nodes. Reproductive: Status post hysterectomy. Other: No abdominal wall hernia or abnormality. No ascites. Musculoskeletal: No acute osseous findings. Redemonstrated, widespread sclerotic osseous metastatic disease, size and density of sclerotic lesions slightly increased compared to prior examination. IMPRESSION: 1. Numerous bilateral solid and subsolid pulmonary nodules are slightly increased in size and solid character compared to prior examination, consistent with slightly worsened pulmonary metastatic disease. 2. Background of scattered ground-glass opacities throughout the lungs is unchanged and these remain nonspecific. 3. Unchanged moderate, loculated left pleural effusion and associated post treatment atelectasis and fibrotic consolidation. 4. Redemonstrated, widespread sclerotic osseous metastatic disease, size and density of sclerotic lesions slightly increased compared to prior examination. 5. Unchanged hyperenhancing lesions of the right lobe of the liver, benign  hemangiomata strongly favored given appearance. Attention on follow-up. Aortic Atherosclerosis (ICD10-I70.0). Electronically Signed   By: Delanna Ahmadi M.D.   On: 05/21/2021 11:17     Assessment/Plan Brain Metastases Leptomeningeal Metastases  Jonnae Mabee is clinically and radiographically stable today.  No new or progressive deficits.  We discussed fatigue, short  term memory loss, mood issues as a consequence of brain metastases and whole brain radiation treatments.  For vertigo, use Meclizine PRN as prior.  Will con't Keppra 231m BID.  We appreciate the opportunity to participate in the care of VTexie Tupou    We ask that VChampagne Palettareturn to clinic in 4 months following next brain MRI, or sooner as needed.  All questions were answered. The patient knows to call the clinic with any problems, questions or concerns. No barriers to learning were detected.  The total time spent in the encounter was 30 minutes and more than 50% was on counseling and review of test results   ZVentura Sellers MD Medical Director of Neuro-Oncology [facility]at W[facility] Springs01/24/23 9:35 AM

## 2021-05-31 ENCOUNTER — Other Ambulatory Visit: Payer: Self-pay

## 2021-05-31 DIAGNOSIS — C3492 Malignant neoplasm of unspecified part of left bronchus or lung: Secondary | ICD-10-CM

## 2021-06-01 ENCOUNTER — Encounter: Payer: Self-pay | Admitting: Internal Medicine

## 2021-06-01 MED ORDER — OSIMERTINIB MESYLATE 80 MG PO TABS: 160.0000 mg | ORAL_TABLET | Freq: Every day | ORAL | 3 refills | Status: DC

## 2021-06-06 ENCOUNTER — Other Ambulatory Visit: Payer: Self-pay | Admitting: Radiation Therapy

## 2021-06-23 ENCOUNTER — Encounter: Payer: Self-pay | Admitting: Internal Medicine

## 2021-06-23 NOTE — Telephone Encounter (Signed)
error 

## 2021-06-28 ENCOUNTER — Ambulatory Visit: Payer: Medicare Other | Admitting: Cardiology

## 2021-06-28 ENCOUNTER — Encounter: Payer: Self-pay | Admitting: Cardiology

## 2021-06-28 ENCOUNTER — Other Ambulatory Visit: Payer: Self-pay

## 2021-06-28 VITALS — BP 104/68 | HR 66 | Ht 65.0 in | Wt 128.0 lb

## 2021-06-28 DIAGNOSIS — C3432 Malignant neoplasm of lower lobe, left bronchus or lung: Secondary | ICD-10-CM | POA: Diagnosis not present

## 2021-06-28 DIAGNOSIS — I447 Left bundle-branch block, unspecified: Secondary | ICD-10-CM

## 2021-06-28 DIAGNOSIS — E785 Hyperlipidemia, unspecified: Secondary | ICD-10-CM | POA: Diagnosis not present

## 2021-06-28 DIAGNOSIS — I428 Other cardiomyopathies: Secondary | ICD-10-CM | POA: Diagnosis not present

## 2021-06-28 NOTE — Progress Notes (Signed)
Cardiology Office Note:    Date:  06/28/2021   ID:  Stefanie Camacho, DOB 05-10-1945, MRN [id number]  PCP:  [name], MD  Cardiologist:  Jenne Campus, MD    Referring MD: [name], MD   No chief complaint on file. Doing far  History of Present Illness:    Stefanie Camacho is a 76 y.o. female with past medical history significant for left bundle branch block, nonischemic cardiomyopathy ejection fraction 40 to 45%, essential hypertension, dyslipidemia, history of ventricular ectopy with no sustained tachycardia.  2021 she was diagnosed with metastatic lung cancer management test his brain as well as bone.  She is being treated aggressively by colorectal team and she is doing quite well.  She did complain of having dizziness and unsteady gait we will try to investigate history of this by any chance related to her heart condition but does not look that way.  She did wear a monitor which shows some extrasystole and some nonsustained supraventricular tachycardia which was asymptomatic.  Past Medical History:  Diagnosis Date   Acute CVA (cerebrovascular accident) ([place]) 01/09/2020   Adenocarcinoma of left lung, stage 4 ([place]) 01/18/2020   Anxiety    Blood clots in brain    Chronic kidney disease due to hypertension 05/16/2020   Chronic kidney disease, stage 3a ([place]) 05/16/2020   Chronic respiratory failure ([place]) 05/16/2020   Cough 03/18/2020   Daytime somnolence 05/16/2020   Dizziness 09/25/2019   Dyslipidemia    Encounter for antineoplastic chemotherapy 01/18/2020   Essential hypertension    Gastro-esophageal reflux disease without esophagitis    Generalized weakness 03/18/2020   GERD (gastroesophageal reflux disease)    Goals of care, counseling/discussion 01/18/2020   Hardening of the aorta (main artery of the heart) ([place]) 09/08/2020   Hyperlipidemia 05/16/2020   Hypothyroidism    Hypothyroidism, unspecified    Insomnia 05/16/2020   Intracranial bleeding ([place]) 01/09/2020   Left bundle  branch block 06/04/2018   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   Long term (current) use of anticoagulants 05/16/2020   Lumbar foraminal stenosis 12/30/2019   Lung mass 05/16/2020   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[place]) 05/16/2020   Major depression, single episode 05/16/2020   Malignant neoplasm of lower lobe, left bronchus or lung ([place]) 01/14/2020   NICM (nonischemic cardiomyopathy) ([place])    Nonischemic cardiomyopathy ([place]) 06/04/2018   Ejection fraction 45% in summer 2019   Nonobstructive cardiomyopathy ([place])    Obstructive sleep apnea syndrome    Recurrent falls 07/12/2020   Rosacea 05/16/2020   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[place]) 01/28/2020   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[place]) 01/29/2020   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/16/2020   Single subsegmental pulmonary embolism without acute cor pulmonale ([place]) 03/25/2020   Sleep apnea    Stress incontinence (female) (female) 05/16/2020   TIA (transient ischemic attack) 01/08/2020   Vertigo 09/25/2019    Past Surgical History:  Procedure Laterality Date   ABDOMINAL HYSTERECTOMY     BLADDER NECK RECONSTRUCTION     BRONCHIAL BIOPSY  01/12/2020   Procedure: BRONCHIAL BIOPSIES;  Surgeon: Garner Nash, DO;  Location: [facility];  Service: Pulmonary;;   BRONCHIAL BRUSHINGS  01/12/2020   Procedure: BRONCHIAL BRUSHINGS;  Surgeon: Garner Nash, DO;  Location: [facility];  Service: Pulmonary;;   BRONCHIAL NEEDLE ASPIRATION BIOPSY  01/12/2020   Procedure: BRONCHIAL NEEDLE ASPIRATION BIOPSIES;  Surgeon: Garner Nash, DO;  Location: [facility];  Service: Pulmonary;;   BRONCHIAL WASHINGS  01/12/2020   Procedure: BRONCHIAL WASHINGS;  Surgeon: Garner Nash, DO;  Location: [facility];  Service: Pulmonary;;   SHOULDER SURGERY     TONSILLECTOMY     VIDEO BRONCHOSCOPY WITH ENDOBRONCHIAL ULTRASOUND  01/12/2020   Procedure: VIDEO BRONCHOSCOPY WITH ENDOBRONCHIAL ULTRASOUND;  Surgeon:  Garner Nash, DO;  Location: [facility];  Service: Pulmonary;;    Current Medications: Current Meds  Medication Sig   apixaban (ELIQUIS) 5 MG TABS tablet Take 1 tablet (5 mg total) by mouth 2 (two) times daily.   atorvastatin (LIPITOR) 10 MG tablet Take 10 mg by mouth daily.   FLUoxetine (PROZAC) 20 MG capsule Take 20 mg by mouth daily.   levETIRAcetam (KEPPRA) 250 MG tablet Take 250 mg by mouth 2 (two) times daily.   levothyroxine (SYNTHROID) 75 MCG tablet Take 1 tablet (75 mcg total) by mouth daily.   loperamide (IMODIUM) 2 MG capsule Take 2 mg by mouth as needed for diarrhea or loose stools.   losartan (COZAAR) 50 MG tablet Take 1 tablet (50 mg total) by mouth daily.   metoprolol succinate (TOPROL-XL) 25 MG 24 hr tablet Take 25 mg by mouth daily.   osimertinib mesylate (TAGRISSO) 80 MG tablet Take 2 tablets (160 mg total) by mouth daily.   pantoprazole (PROTONIX) 40 MG tablet Take 1 tablet by mouth once daily (Patient taking differently: Take 40 mg by mouth every evening.)   Polyethyl Glycol-Propyl Glycol (SYSTANE OP) Apply 1 drop to eye as needed (Eye dryness/ Unknown strength).   spironolactone (ALDACTONE) 25 MG tablet Take 25 mg by mouth daily.     Allergies:   Sertraline hcl   Social History   Socioeconomic History   Marital status: Divorced    Spouse name: Not on file   Number of children: Not on file   Years of education: Not on file   Highest education level: Not on file  Occupational History   Not on file  Tobacco Use   Smoking status: Never    Passive exposure: Never   Smokeless tobacco: Never  Vaping Use   Vaping Use: Never used  Substance and Sexual Activity   Alcohol use: Not Currently   Drug use: Never   Sexual activity: Not on file  Other Topics Concern   Not on file  Social History Narrative   Not on file   Social Determinants of Health   Financial Resource Strain: Not on file  Food Insecurity: Not on file  Transportation Needs: Not on file   Physical Activity: Not on file  Stress: Not on file  Social Connections: Not on file     Family History: The patient's family history includes Heart disease in her father, maternal grandfather, mother, and paternal grandfather; Stroke in her father and mother. ROS:   Please see the history of present illness.    All 14 point review of systems negative except as described per history of present illness  EKGs/Labs/Other Studies Reviewed:      Recent Labs: 05/19/2021: ALT 20; BUN 20; Creatinine 1.05; Hemoglobin 11.4; Platelet Count 112; Potassium 3.9; Sodium 139  Recent Lipid Panel    Component Value Date/Time   CHOL 127 01/09/2020 0416   CHOL 133 07/17/2018 0933   TRIG 112 01/09/2020 0416   HDL 38 (L) 01/09/2020 0416   HDL 40 07/17/2018 0933   CHOLHDL 3.3 01/09/2020 0416   VLDL 22 01/09/2020 0416   LDLCALC 67 01/09/2020 0416   LDLCALC 67 07/17/2018 0933    Physical Exam:  VS:  BP 104/68 (BP Location: Right Arm)    Pulse 66    Ht 5\' 5"  (1.651 m)    Wt 128 lb (58.1 kg)    SpO2 97%    BMI 21.30 kg/m     Wt Readings from Last 3 Encounters:  06/28/21 128 lb (58.1 kg)  05/30/21 134 lb 11.2 oz (61.1 kg)  05/24/21 133 lb 9.6 oz (60.6 kg)     GEN:  Well nourished, well developed in no acute distress HEENT: Normal NECK: No JVD; No carotid bruits LYMPHATICS: No lymphadenopathy CARDIAC: RRR, no murmurs, no rubs, no gallops RESPIRATORY:  Clear to auscultation without rales, wheezing or rhonchi  ABDOMEN: Soft, non-tender, non-distended MUSCULOSKELETAL:  No edema; No deformity  SKIN: Warm and dry LOWER EXTREMITIES: no swelling NEUROLOGIC:  Alert and oriented x 3 PSYCHIATRIC:  Normal affect   ASSESSMENT:    1. Nonischemic cardiomyopathy ([place])   2. Left bundle branch block   3. Malignant neoplasm of lower lobe, left bronchus or lung ([place])   4. Dyslipidemia    PLAN:    In order of problems listed above:  Nonischemic cardiomyopathy she is on appropriate medication she  can tolerate. I see her I will repeat echocardiogram overall ejection fraction is mildly reduced.  She complain of having weakness fatigue which is probably related to metastatic lung cancer with aggressive chemotherapy. Left bundle branch block which is old normal chronic Malignant neoplasm of the lungs followed by oncology team Dyslipidemia she is on Lipitor 10.  Therapeutic K PN which show me her LDL of 67 HDL 38 is a good cholesterol profile we will continue present management   Medication Adjustments/Labs and Tests Ordered: Current medicines are reviewed at length with the patient today.  Concerns regarding medicines are outlined above.  No orders of the defined types were placed in this encounter.  Medication changes: No orders of the defined types were placed in this encounter.   Signed, Park Liter, MD, [facility] 06/28/2021 11:45 AM    [place]

## 2021-06-28 NOTE — Patient Instructions (Signed)
Medication Instructions:  Your physician recommends that you continue on your current medications as directed. Please refer to the Current Medication list given to you today.  *If you need a refill on your cardiac medications before your next appointment, please call your pharmacy*   Lab Work: None If you have labs (blood work) drawn today and your tests are completely normal, you will receive your results only by: [place] (if you have MyChart) OR A paper copy in the mail If you have any lab test that is abnormal or we need to change your treatment, we will call you to review the results.   Testing/Procedures: None   Follow-Up: At [facility], you and your health needs are our priority.  As part of our continuing mission to provide you with exceptional heart care, we have created designated Provider Care Teams.  These Care Teams include your primary Cardiologist (physician) and Advanced Practice Providers (APPs -  Physician Assistants and Nurse Practitioners) who all work together to provide you with the care you need, when you need it.  We recommend signing up for the patient portal called "MyChart".  Sign up information is provided on this After Visit Summary.  MyChart is used to connect with patients for Virtual Visits (Telemedicine).  Patients are able to view lab/test results, encounter notes, upcoming appointments, etc.  Non-urgent messages can be sent to your provider as well.   To learn more about what you can do with MyChart, go to [email or web address].    Your next appointment:   5 month(s)  The format for your next appointment:   In Person  Provider:   Jenne Campus, MD    Other Instructions None

## 2021-06-29 ENCOUNTER — Telehealth: Payer: Self-pay | Admitting: Cardiology

## 2021-06-29 NOTE — Telephone Encounter (Signed)
Pt would like to know which of her two medication lists she received after her visit is correct... please advise

## 2021-06-29 NOTE — Telephone Encounter (Signed)
Pt states that she had 2 medication list from her visit yesterday. I explained that the 1 list is her list before her visit which did not have her Atorvastatin on it and after her visit where the medication was added the list was then updated to reflect the change. Pt verblaized understanding and had no additional questions.

## 2021-07-03 ENCOUNTER — Other Ambulatory Visit (HOSPITAL_COMMUNITY): Payer: Self-pay

## 2021-07-05 ENCOUNTER — Other Ambulatory Visit: Payer: Self-pay

## 2021-07-05 ENCOUNTER — Encounter: Payer: Self-pay | Admitting: Internal Medicine

## 2021-07-05 ENCOUNTER — Inpatient Hospital Stay: Payer: Medicare Other | Attending: Internal Medicine

## 2021-07-05 ENCOUNTER — Inpatient Hospital Stay: Payer: Medicare Other | Admitting: Internal Medicine

## 2021-07-05 VITALS — BP 125/73 | HR 78 | Temp 97.7°F | Resp 19 | Ht 65.0 in | Wt 132.6 lb

## 2021-07-05 DIAGNOSIS — C7951 Secondary malignant neoplasm of bone: Secondary | ICD-10-CM | POA: Diagnosis not present

## 2021-07-05 DIAGNOSIS — Z5111 Encounter for antineoplastic chemotherapy: Secondary | ICD-10-CM

## 2021-07-05 DIAGNOSIS — Z86711 Personal history of pulmonary embolism: Secondary | ICD-10-CM | POA: Insufficient documentation

## 2021-07-05 DIAGNOSIS — Z7901 Long term (current) use of anticoagulants: Secondary | ICD-10-CM | POA: Diagnosis not present

## 2021-07-05 DIAGNOSIS — C7931 Secondary malignant neoplasm of brain: Secondary | ICD-10-CM | POA: Diagnosis not present

## 2021-07-05 DIAGNOSIS — Z79899 Other long term (current) drug therapy: Secondary | ICD-10-CM | POA: Diagnosis not present

## 2021-07-05 DIAGNOSIS — C3432 Malignant neoplasm of lower lobe, left bronchus or lung: Secondary | ICD-10-CM

## 2021-07-05 DIAGNOSIS — C3492 Malignant neoplasm of unspecified part of left bronchus or lung: Secondary | ICD-10-CM

## 2021-07-05 DIAGNOSIS — C3490 Malignant neoplasm of unspecified part of unspecified bronchus or lung: Secondary | ICD-10-CM

## 2021-07-05 LAB — CMP (CANCER CENTER ONLY)
ALT: 14 U/L (ref 0–44)
AST: 17 U/L (ref 15–41)
Albumin: 3.9 g/dL (ref 3.5–5.0)
Alkaline Phosphatase: 338 U/L — ABNORMAL HIGH (ref 38–126)
Anion gap: 5 (ref 5–15)
BUN: 21 mg/dL (ref 8–23)
CO2: 28 mmol/L (ref 22–32)
Calcium: 9.4 mg/dL (ref 8.9–10.3)
Chloride: 106 mmol/L (ref 98–111)
Creatinine: 0.94 mg/dL (ref 0.44–1.00)
GFR, Estimated: >60 mL/min (ref >60)
Glucose, Bld: 98 mg/dL (ref 70–99)
Potassium: 3.6 mmol/L (ref 3.5–5.1)
Sodium: 139 mmol/L (ref 135–145)
Total Bilirubin: 0.6 mg/dL (ref 0.3–1.2)
Total Protein: 6.7 g/dL (ref 6.5–8.1)

## 2021-07-05 LAB — CBC WITH DIFFERENTIAL (CANCER CENTER ONLY)
Abs Immature Granulocytes: 0.01 10*3/uL (ref 0.00–0.07)
Basophils Absolute: 0 10*3/uL (ref 0.0–0.1)
Basophils Relative: 0 %
Eosinophils Absolute: 0.1 10*3/uL (ref 0.0–0.5)
Eosinophils Relative: 2 %
HCT: 33.9 % — ABNORMAL LOW (ref 36.0–46.0)
Hemoglobin: 11.6 g/dL — ABNORMAL LOW (ref 12.0–15.0)
Immature Granulocytes: 0 %
Lymphocytes Relative: 18 %
Lymphs Abs: 0.7 10*3/uL (ref 0.7–4.0)
MCH: 31 pg (ref 26.0–34.0)
MCHC: 34.2 g/dL (ref 30.0–36.0)
MCV: 90.6 fL (ref 80.0–100.0)
Monocytes Absolute: 0.3 10*3/uL (ref 0.1–1.0)
Monocytes Relative: 8 %
Neutro Abs: 2.7 10*3/uL (ref 1.7–7.7)
Neutrophils Relative %: 72 %
Platelet Count: 111 10*3/uL — ABNORMAL LOW (ref 150–400)
RBC: 3.74 MIL/uL — ABNORMAL LOW (ref 3.87–5.11)
RDW: 14.6 % (ref 11.5–15.5)
WBC Count: 3.8 10*3/uL — ABNORMAL LOW (ref 4.0–10.5)
nRBC: 0 % (ref 0.0–0.2)

## 2021-07-05 NOTE — Progress Notes (Signed)
Stefanie Camacho [phone]   [phone]  OFFICE PROGRESS NOTE  Stefanie Ada, MD [place] [id number]  DIAGNOSIS:  1) Stage IV (T3, N2, M1 C) non-small cell lung cancer presented with left lower lobe lung mass in addition to extensive bilateral pulmonary metastasis and anterior mediastinal lymphadenopathy as well as skeletal metastasis and metastatic disease to the brain as well as leptomeningeal disease diagnosed in September 2021. 2) pulmonary embolus in the right main pulmonary artery diagnosed in November 2021.   Biomarker Findings Microsatellite status - MS-Stable Tumor Mutational Burden - 1 Muts/Mb Genomic Findings For a complete list of the genes assayed, please refer to the Appendix. EGFR exon 19 deletion ([id number]) 7 Disease relevant genes with no reportable alterations: ALK, BRAF, ERBB2, KRAS, MET, RET, ROS1  PDL1 expression: 2%.   PRIOR THERAPY: Whole brain radiation and radiotherapy to the osseous metastases under the care of Dr. Lisbeth Renshaw. Last treatment expected on 02/02/20   CURRENT THERAPY:  1) Tagrisso 160 mg p.o. daily. First dose on 02/01/20.  Status post 17.5 months of treatment. 2) Eliquis 5 mg p.o. twice daily  INTERVAL HISTORY: Stefanie Camacho 76 y.o. female returns to the clinic today for follow-up visit accompanied by her son.  The patient is feeling fine today with no concerning complaints except for few episodes of vertigo the last few weeks.  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  It comes in episodes.  She had evaluation by ENT in the past and she had exercise for positional vertigo in the past.  The patient continues to tolerate her treatment with Tagrisso fairly well.  She has few episodes of diarrhea improved with Imodium.  She denied having any chest pain, shortness of breath, cough or hemoptysis.  She denied having any fever or chills.  She has no nausea, vomiting, abdominal pain or  constipation.  She is here today for evaluation and repeat blood work.  MEDICAL HISTORY: Past Medical History:  Diagnosis Date   Acute CVA (cerebrovascular accident) ([place]) 01/09/2020   Adenocarcinoma of left lung, stage 4 ([place]) 01/18/2020   Anxiety    Blood clots in brain    Chronic kidney disease due to hypertension 05/16/2020   Chronic kidney disease, stage 3a ([place]) 05/16/2020   Chronic respiratory failure ([place]) 05/16/2020   Cough 03/18/2020   Daytime somnolence 05/16/2020   Dizziness 09/25/2019   Dyslipidemia    Encounter for antineoplastic chemotherapy 01/18/2020   Essential hypertension    Gastro-esophageal reflux disease without esophagitis    Generalized weakness 03/18/2020   GERD (gastroesophageal reflux disease)    Goals of care, counseling/discussion 01/18/2020   Hardening of the aorta (main artery of the heart) ([place]) 09/08/2020   Hyperlipidemia 05/16/2020   Hypothyroidism    Hypothyroidism, unspecified    Insomnia 05/16/2020   Intracranial bleeding ([place]) 01/09/2020   Left bundle branch block 06/04/2018   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   Long term (current) use of anticoagulants 05/16/2020   Lumbar foraminal stenosis 12/30/2019   Lung mass 05/16/2020   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[place]) 05/16/2020   Major depression, single episode 05/16/2020   Malignant neoplasm of lower lobe, left bronchus or lung ([place]) 01/14/2020   NICM (nonischemic cardiomyopathy) ([place])    Nonischemic cardiomyopathy ([place]) 06/04/2018   Ejection fraction 45% in summer 2019   Nonobstructive cardiomyopathy ([place])    Obstructive sleep apnea syndrome    Recurrent falls 07/12/2020  Rosacea 05/16/2020   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[place]) 01/28/2020   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[place]) 01/29/2020   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  MEDICATIONS:  Current Outpatient Medications  Medication Sig Dispense Refill   apixaban (ELIQUIS) 5 MG TABS tablet Take 1 tablet (5 mg total) by mouth 2 (two) times daily. 90 tablet 1   atorvastatin (LIPITOR) 10 MG tablet Take 10 mg by mouth daily.     FLUoxetine (PROZAC) 20 MG capsule Take 20 mg by mouth daily.     levETIRAcetam (KEPPRA) 250 MG tablet Take 250 mg by mouth 2 (two) times daily.     levothyroxine (SYNTHROID) 75 MCG tablet Take 1 tablet (75 mcg total) by mouth daily. 30 tablet 1   loperamide (IMODIUM) 2 MG capsule Take 2 mg by mouth as needed for diarrhea or loose stools.     losartan (COZAAR) 50 MG tablet Take 1 tablet (50 mg total) by mouth daily. 90 tablet 1   metoprolol succinate (TOPROL-XL) 25 MG 24 hr tablet Take 25 mg by mouth daily.     osimertinib mesylate (TAGRISSO) 80 MG tablet Take 2 tablets (160 mg total) by mouth daily. 60 tablet 3   pantoprazole (PROTONIX) 40 MG tablet Take 1 tablet by mouth once daily (Patient taking differently: Take 40 mg by mouth every evening.) 30 tablet 0   Polyethyl Glycol-Propyl Glycol (SYSTANE OP) Apply 1 drop to eye as needed (Eye dryness/ Unknown strength).     prochlorperazine (COMPAZINE) 10 MG tablet Take 1 tablet (10 mg total) by mouth every 6 (six) hours as needed for nausea or vomiting. (Patient not taking: Reported on 05/30/2021) 45 tablet 5   spironolactone (ALDACTONE) 25 MG tablet Take 25 mg by mouth daily.     No current facility-administered medications for this visit.    SURGICAL HISTORY:  Past Surgical History:  Procedure Laterality Date   ABDOMINAL HYSTERECTOMY     BLADDER NECK RECONSTRUCTION     BRONCHIAL BIOPSY  01/12/2020   Procedure: BRONCHIAL BIOPSIES;  Surgeon: Garner Nash, DO;  Location: [facility];  Service: Pulmonary;;   BRONCHIAL BRUSHINGS   01/12/2020   Procedure: BRONCHIAL BRUSHINGS;  Surgeon: Garner Nash, DO;  Location: [facility];  Service: Pulmonary;;   BRONCHIAL NEEDLE ASPIRATION BIOPSY  01/12/2020   Procedure: BRONCHIAL NEEDLE ASPIRATION BIOPSIES;  Surgeon: Garner Nash, DO;  Location: [facility];  Service: Pulmonary;;   BRONCHIAL WASHINGS  01/12/2020   Procedure: BRONCHIAL WASHINGS;  Surgeon: Garner Nash, DO;  Location: [facility];  Service: Pulmonary;;   SHOULDER SURGERY     TONSILLECTOMY     VIDEO BRONCHOSCOPY WITH ENDOBRONCHIAL ULTRASOUND  01/12/2020   Procedure: VIDEO BRONCHOSCOPY WITH ENDOBRONCHIAL ULTRASOUND;  Surgeon: Garner Nash, DO;  Location: [facility];  Service: Pulmonary;;    REVIEW OF SYSTEMS:  A comprehensive review of systems was negative except for: Constitutional: positive for fatigue Gastrointestinal: positive for diarrhea Musculoskeletal: positive for muscle weakness Neurological: positive for vertigo   PHYSICAL EXAMINATION: General appearance: alert, cooperative, fatigued, and no distress Head: Normocephalic, without obvious abnormality, atraumatic Neck: no adenopathy, no JVD, supple, symmetrical, trachea midline, and thyroid not enlarged, symmetric, no tenderness/mass/nodules Lymph nodes: Cervical,  supraclavicular, and axillary nodes normal. Resp: clear to auscultation bilaterally Back: symmetric, no curvature. ROM normal. No CVA tenderness. Cardio: regular rate and rhythm, S1, S2 normal, no murmur, click, rub or gallop GI: soft, non-tender; bowel sounds normal; no masses,  no organomegaly Extremities: extremities normal, atraumatic, no cyanosis or edema  ECOG PERFORMANCE STATUS: 1 - Symptomatic but completely ambulatory  Blood pressure 125/73, pulse 78, temperature 97.7 F (36.5 C), temperature source Tympanic, resp. rate 19, height _0  (1.651 m), weight 132 lb 9.6 oz (60.1 kg), SpO2 98 %.  LABORATORY DATA: Lab Results  Component Value Date   WBC 3.8 (L) 07/05/2021    HGB 11.6 (L) 07/05/2021   HCT 33.9 (L) 07/05/2021   MCV 90.6 07/05/2021   PLT 111 (L) 07/05/2021      Chemistry      Component Value Date/Time   NA 139 07/05/2021 0844   K 3.6 07/05/2021 0844   CL 106 07/05/2021 0844   CO2 28 07/05/2021 0844   BUN 21 07/05/2021 0844   CREATININE 0.94 07/05/2021 0844      Component Value Date/Time   CALCIUM 9.4 07/05/2021 0844   ALKPHOS 338 (H) 07/05/2021 0844   AST 17 07/05/2021 0844   ALT 14 07/05/2021 0844   BILITOT 0.6 07/05/2021 0844       RADIOGRAPHIC STUDIES: No results found.   ASSESSMENT AND PLAN: This is a very pleasant 76 years old white female recently diagnosed with a stage IV non-small cell lung cancer, adenocarcinoma in September 2021 and presented with left lower lobe lung mass in addition to extensive bilateral pulmonary metastasis and anterior mediastinal lymphadenopathy as well as a skeletal metastasis and metastatic disease to the brain with leptomeningeal disease.  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 She underwent whole brain irradiation and currently undergoing treatment with targeted therapy with Tagrisso 160 mg p.o. daily because of the leptomeningeal disease status post 17.5  months of treatment.  The patient has been tolerating this treatment well with no concerning adverse effect except for few episodes of diarrhea improved with Imodium. I recommended for her to continue her current treatment with Tagrisso with the same dose for now. I will see her back for follow-up visit in 6 weeks for evaluation with repeat CT scan of the chest, abdomen and pelvis for restaging of her disease. For the leptomeningeal disease and brain metastasis, her last MRI of the brain in January 2023 showed no concerning findings for disease progression.  She is followed by Dr. Mickeal Skinner. For the history of pulmonary embolism, she will continue her current treatment with  Eliquis. For the depression, she will continue her current treatment with Prozac. The patient was advised to call immediately if she has any other concerning symptoms in the interval. The patient voices understanding of current disease status and treatment options and is in agreement with the current care plan.  All questions were answered. The patient knows to call the clinic with any problems, questions or concerns. We can certainly see the patient much sooner if necessary.  Disclaimer: This note was dictated with voice recognition software. Similar sounding words can inadvertently be transcribed and may not be corrected upon review.

## 2021-07-10 ENCOUNTER — Telehealth: Payer: Self-pay | Admitting: Internal Medicine

## 2021-07-10 NOTE — Telephone Encounter (Signed)
Scheduled per 03/01 los, patient has been called and voicemail was left. ?

## 2021-07-26 ENCOUNTER — Telehealth: Payer: Self-pay | Admitting: Orthopaedic Surgery

## 2021-07-26 NOTE — Telephone Encounter (Signed)
Pt called requesting to make an new pt appt. Pt states she had surgery on body that need to be seen for she stated 10 years ago. Unsure if Dr. Sammuel Hines want her to obtain those records from past surgery. Please call pt at [phone]. ?

## 2021-07-26 NOTE — Telephone Encounter (Signed)
LMOM to schedule appt and answer questions ?

## 2021-07-26 NOTE — Telephone Encounter (Signed)
Scheduled for Friday 3/24. Patient stated Dr. Sharol Given performed her rotator cuff surgery 10 yrs ago so records should be within [facility]. ?

## 2021-07-27 ENCOUNTER — Other Ambulatory Visit (HOSPITAL_BASED_OUTPATIENT_CLINIC_OR_DEPARTMENT_OTHER): Payer: Self-pay

## 2021-07-27 ENCOUNTER — Other Ambulatory Visit (HOSPITAL_BASED_OUTPATIENT_CLINIC_OR_DEPARTMENT_OTHER): Payer: Self-pay | Admitting: Orthopaedic Surgery

## 2021-07-27 DIAGNOSIS — M25511 Pain in right shoulder: Secondary | ICD-10-CM

## 2021-07-28 ENCOUNTER — Other Ambulatory Visit: Payer: Self-pay

## 2021-07-28 ENCOUNTER — Ambulatory Visit (HOSPITAL_BASED_OUTPATIENT_CLINIC_OR_DEPARTMENT_OTHER)
Admission: RE | Admit: 2021-07-28 | Discharge: 2021-07-28 | Disposition: A | Discharge status: Home / Self Care | Payer: Medicare Other | Source: Ambulatory Visit | Attending: Orthopaedic Surgery | Admitting: Orthopaedic Surgery

## 2021-07-28 ENCOUNTER — Ambulatory Visit (HOSPITAL_BASED_OUTPATIENT_CLINIC_OR_DEPARTMENT_OTHER): Payer: Medicare Other | Admitting: Orthopaedic Surgery

## 2021-07-28 DIAGNOSIS — M25511 Pain in right shoulder: Secondary | ICD-10-CM

## 2021-07-28 NOTE — Progress Notes (Signed)
? ?                            ? ? ?Chief Complaint: Right shoulder pain/right arm pain ?  ? ? ?History of Present Illness:  ? ? ?Stefanie Camacho is a 76 y.o. female presents today for ongoing right mid humeral pain for 2 months.  She has not had any specific injury or accident.  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  She sees Dr. Julien Nordmann with the cancer center.  She is here today as she continues to feel weakness in the right arm.  She does have pain about the mid humerus when going to reach for couple coffee.  She is here today seeking additional treatment for this. ? ? ? ?Surgical History:   ?None ? ?PMH/PSH/Family History/Social History/Meds/Allergies:   ? ?Past Medical History:  ?Diagnosis Date  ? Acute CVA (cerebrovascular accident) ([place]) 01/09/2020  ? Adenocarcinoma of left lung, stage 4 ([place]) 01/18/2020  ? Anxiety   ? Blood clots in brain   ? Chronic kidney disease due to hypertension 05/16/2020  ? Chronic kidney disease, stage 3a ([place]) 05/16/2020  ? Chronic respiratory failure ([place]) 05/16/2020  ? Cough 03/18/2020  ? Daytime somnolence 05/16/2020  ? Dizziness 09/25/2019  ? Dyslipidemia   ? Encounter for antineoplastic chemotherapy 01/18/2020  ? Essential hypertension   ? Gastro-esophageal reflux disease without esophagitis   ? Generalized weakness 03/18/2020  ? GERD (gastroesophageal reflux disease)   ? Goals of care, counseling/discussion 01/18/2020  ? Hardening of the aorta (main artery of the heart) ([place]) 09/08/2020  ? Hyperlipidemia 05/16/2020  ? Hypothyroidism   ? Hypothyroidism, unspecified   ? Insomnia 05/16/2020  ? Intracranial bleeding ([place]) 01/09/2020  ? Left bundle branch block 06/04/2018  ? 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 Long term (current) use of anticoagulants 05/16/2020  ? Lumbar foraminal stenosis 12/30/2019  ? Lung mass 05/16/2020  ? 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 Major depression, single episode 05/16/2020  ? Malignant neoplasm of lower lobe, left bronchus or lung ([place]) 01/14/2020  ? NICM (nonischemic cardiomyopathy) ([place])   ? Nonischemic cardiomyopathy ([place]) 06/04/2018  ? Ejection fraction 45% in summer 2019  ? Nonobstructive cardiomyopathy ([place])   ? Obstructive sleep apnea syndrome   ? Recurrent falls 07/12/2020  ? Rosacea 05/16/2020  ? 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[place]) 01/28/2020  ? 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 Single subsegmental pulmonary embolism without acute cor pulmonale ([place]) 03/25/2020  ? Sleep apnea   ? Stress incontinence (female) (female) 05/16/2020  ? TIA (transient ischemic attack) 01/08/2020  ? Vertigo 09/25/2019  ? ?Past Surgical History:  ?Procedure Laterality Date  ? ABDOMINAL HYSTERECTOMY    ? BLADDER NECK RECONSTRUCTION    ? BRONCHIAL BIOPSY  01/12/2020  ? Procedure: BRONCHIAL BIOPSIES;  Surgeon: Garner Nash, DO;  Location: [facility];  Service: Pulmonary;;  ? BRONCHIAL BRUSHINGS  01/12/2020  ? Procedure: BRONCHIAL BRUSHINGS;  Surgeon: Garner Nash, DO;  Location: [facility];  Service: Pulmonary;;  ? BRONCHIAL NEEDLE ASPIRATION BIOPSY  01/12/2020  ? Procedure: BRONCHIAL NEEDLE ASPIRATION BIOPSIES;  Surgeon: Garner Nash, DO;  Location: [facility];  Service: Pulmonary;;  ? BRONCHIAL WASHINGS  01/12/2020  ?  Procedure: BRONCHIAL WASHINGS;  Surgeon: Garner Nash, DO;  Location: [facility];  Service: Pulmonary;;  ? SHOULDER SURGERY    ? TONSILLECTOMY    ? VIDEO BRONCHOSCOPY WITH ENDOBRONCHIAL ULTRASOUND  01/12/2020  ? Procedure: VIDEO BRONCHOSCOPY WITH ENDOBRONCHIAL ULTRASOUND;  Surgeon: Garner Nash, DO;  Location: [facility];  Service: Pulmonary;;  ? ?Social History  ? ?Socioeconomic History  ? Marital status: Divorced  ?  Spouse name: Not on file  ? Number of children: Not on file  ? Years of education: Not on  file  ? Highest education level: Not on file  ?Occupational History  ? Not on file  ?Tobacco Use  ? Smoking status: Never  ?  Passive exposure: Never  ? Smokeless tobacco: Never  ?Vaping Use  ? Vaping Use: Never used  ?Substance and Sexual Activity  ? Alcohol use: Not Currently  ? Drug use: Never  ? Sexual activity: Not on file  ?Other Topics Concern  ? Not on file  ?Social History Narrative  ? Not on file  ? ?Social Determinants of Health  ? ?Financial Resource Strain: Not on file  ?Food Insecurity: Not on file  ?Transportation Needs: Not on file  ?Physical Activity: Not on file  ?Stress: Not on file  ?Social Connections: Not on file  ? ?Family History  ?Problem Relation Age of Onset  ? Heart disease Mother   ? Stroke Mother   ? Heart disease Father   ? Stroke Father   ? Heart disease Maternal Grandfather   ? Heart disease Paternal Grandfather   ? ?Allergies  ?Allergen Reactions  ? Sertraline Hcl Other (See Comments)  ?  Severe diarrhea  ? ?Current Outpatient Medications  ?Medication Sig Dispense Refill  ? apixaban (ELIQUIS) 5 MG TABS tablet Take 1 tablet (5 mg total) by mouth 2 (two) times daily. 90 tablet 1  ? atorvastatin (LIPITOR) 10 MG tablet Take 10 mg by mouth daily.    ? FLUoxetine (PROZAC) 20 MG capsule Take 20 mg by mouth daily.    ? levETIRAcetam (KEPPRA) 250 MG tablet Take 250 mg by mouth 2 (two) times daily.    ? levothyroxine (SYNTHROID) 75 MCG tablet Take 1 tablet (75 mcg total) by mouth daily. 30 tablet 1  ? loperamide (IMODIUM) 2 MG capsule Take 2 mg by mouth as needed for diarrhea or loose stools.    ? losartan (COZAAR) 50 MG tablet Take 1 tablet (50 mg total) by mouth daily. 90 tablet 1  ? metoprolol succinate (TOPROL-XL) 25 MG 24 hr tablet Take 25 mg by mouth daily.    ? osimertinib mesylate (TAGRISSO) 80 MG tablet Take 2 tablets (160 mg total) by mouth daily. 60 tablet 3  ? pantoprazole (PROTONIX) 40 MG tablet Take 1 tablet by mouth once daily (Patient taking differently: Take 40 mg by mouth  every evening.) 30 tablet 0  ? prochlorperazine (COMPAZINE) 10 MG tablet Take 1 tablet (10 mg total) by mouth every 6 (six) hours as needed for nausea or vomiting. (Patient not taking: Reported on 05/30/2021) 45 tablet 5  ? spironolactone (ALDACTONE) 25 MG tablet Take 25 mg by mouth daily.    ? ?No current facility-administered medications for this visit.  ? ?No results found. ? ?Review of Systems:   ?A ROS was performed including pertinent positives and negatives as documented in the HPI. ? ?Physical Exam :   ?Constitutional: NAD and appears stated age ?Neurological: Alert and oriented ?Psych: Appropriate affect and cooperative ?There were no  vitals taken for this visit.  ? ?Comprehensive Musculoskeletal Exam:   ? ?Tenderness about the mid humerus.  She is able to elevate the arm slowly to 170 degrees forward elevation with external rotation at side to 50 degrees.  There is some weakness although this was not fully tested due to the x-ray findings.  Sensation is intact throughout.  2+ radial pulse ? ?Imaging:   ?Xray (x-ray right shoulder 3 views): ?There is patchy disease of the mid humerus which is partially lytic appearing consistent with a possible metastasis ? ? ?I personally reviewed and interpreted the radiographs. ? ? ?Assessment:   ?76 year old female with likely pulmonary metastasis to her right humerus.  I have discussed with her indications for surgery.  Specifically we did discuss that should her pain worsen that she may ultimately be a candidate for intramedullary rod fixation although I would lean against this at this time for fear of seeding other portions of her humerus.  That being said I think the ideal treatment for her right arm pain and weakness would be radiation to the specific area.  With regard to that I will defer to her primary oncology doctor who she will plan to see in the next 2 weeks.  I will see her back on an as-needed basis should her pain worsen at which point she may potentially  become a candidate for nail fixation of the humerus ? ?Plan :   ? ?-Return to clinic as needed ? ? ? ? ?I personally saw and evaluated the patient, and participated in the management and treatment plan.

## 2021-08-01 DIAGNOSIS — R2681 Unsteadiness on feet: Secondary | ICD-10-CM | POA: Diagnosis not present

## 2021-08-01 DIAGNOSIS — M6281 Muscle weakness (generalized): Secondary | ICD-10-CM | POA: Diagnosis not present

## 2021-08-01 DIAGNOSIS — R278 Other lack of coordination: Secondary | ICD-10-CM | POA: Diagnosis not present

## 2021-08-03 ENCOUNTER — Inpatient Hospital Stay (HOSPITAL_BASED_OUTPATIENT_CLINIC_OR_DEPARTMENT_OTHER)
Admission: EM | Admit: 2021-08-03 | Discharge: 2021-08-08 | DRG: 175 | Disposition: A | Discharge status: Nursing Home (Includes ICF) | Payer: Medicare Other | Source: Skilled Nursing Facility | Attending: Student | Admitting: Student

## 2021-08-03 ENCOUNTER — Encounter (HOSPITAL_BASED_OUTPATIENT_CLINIC_OR_DEPARTMENT_OTHER): Payer: Self-pay

## 2021-08-03 ENCOUNTER — Other Ambulatory Visit: Payer: Self-pay

## 2021-08-03 ENCOUNTER — Encounter: Payer: Self-pay | Admitting: Internal Medicine

## 2021-08-03 ENCOUNTER — Emergency Department (HOSPITAL_BASED_OUTPATIENT_CLINIC_OR_DEPARTMENT_OTHER): Payer: Medicare Other | Admitting: Radiology

## 2021-08-03 DIAGNOSIS — I509 Heart failure, unspecified: Secondary | ICD-10-CM | POA: Diagnosis not present

## 2021-08-03 DIAGNOSIS — I10 Essential (primary) hypertension: Secondary | ICD-10-CM | POA: Diagnosis present

## 2021-08-03 DIAGNOSIS — D696 Thrombocytopenia, unspecified: Secondary | ICD-10-CM | POA: Diagnosis not present

## 2021-08-03 DIAGNOSIS — J9601 Acute respiratory failure with hypoxia: Secondary | ICD-10-CM | POA: Diagnosis not present

## 2021-08-03 DIAGNOSIS — Z79899 Other long term (current) drug therapy: Secondary | ICD-10-CM

## 2021-08-03 DIAGNOSIS — J90 Pleural effusion, not elsewhere classified: Secondary | ICD-10-CM | POA: Diagnosis present

## 2021-08-03 DIAGNOSIS — I5041 Acute combined systolic (congestive) and diastolic (congestive) heart failure: Secondary | ICD-10-CM | POA: Diagnosis not present

## 2021-08-03 DIAGNOSIS — I130 Hypertensive heart and chronic kidney disease with heart failure and stage 1 through stage 4 chronic kidney disease, or unspecified chronic kidney disease: Secondary | ICD-10-CM | POA: Diagnosis present

## 2021-08-03 DIAGNOSIS — Z79890 Hormone replacement therapy (postmenopausal): Secondary | ICD-10-CM

## 2021-08-03 DIAGNOSIS — R2681 Unsteadiness on feet: Secondary | ICD-10-CM | POA: Diagnosis not present

## 2021-08-03 DIAGNOSIS — I951 Orthostatic hypotension: Secondary | ICD-10-CM | POA: Diagnosis present

## 2021-08-03 DIAGNOSIS — J9621 Acute and chronic respiratory failure with hypoxia: Secondary | ICD-10-CM | POA: Diagnosis present

## 2021-08-03 DIAGNOSIS — C771 Secondary and unspecified malignant neoplasm of intrathoracic lymph nodes: Secondary | ICD-10-CM | POA: Diagnosis not present

## 2021-08-03 DIAGNOSIS — C3492 Malignant neoplasm of unspecified part of left bronchus or lung: Secondary | ICD-10-CM | POA: Diagnosis present

## 2021-08-03 DIAGNOSIS — E440 Moderate protein-calorie malnutrition: Secondary | ICD-10-CM | POA: Diagnosis not present

## 2021-08-03 DIAGNOSIS — C7801 Secondary malignant neoplasm of right lung: Secondary | ICD-10-CM | POA: Diagnosis not present

## 2021-08-03 DIAGNOSIS — R778 Other specified abnormalities of plasma proteins: Secondary | ICD-10-CM

## 2021-08-03 DIAGNOSIS — C7951 Secondary malignant neoplasm of bone: Secondary | ICD-10-CM | POA: Diagnosis present

## 2021-08-03 DIAGNOSIS — I5021 Acute systolic (congestive) heart failure: Secondary | ICD-10-CM | POA: Diagnosis not present

## 2021-08-03 DIAGNOSIS — K5900 Constipation, unspecified: Secondary | ICD-10-CM | POA: Diagnosis present

## 2021-08-03 DIAGNOSIS — I2609 Other pulmonary embolism with acute cor pulmonale: Secondary | ICD-10-CM | POA: Diagnosis not present

## 2021-08-03 DIAGNOSIS — I5043 Acute on chronic combined systolic (congestive) and diastolic (congestive) heart failure: Secondary | ICD-10-CM | POA: Insufficient documentation

## 2021-08-03 DIAGNOSIS — M6281 Muscle weakness (generalized): Secondary | ICD-10-CM | POA: Diagnosis not present

## 2021-08-03 DIAGNOSIS — I428 Other cardiomyopathies: Secondary | ICD-10-CM | POA: Diagnosis not present

## 2021-08-03 DIAGNOSIS — Z515 Encounter for palliative care: Secondary | ICD-10-CM | POA: Diagnosis not present

## 2021-08-03 DIAGNOSIS — I110 Hypertensive heart disease with heart failure: Secondary | ICD-10-CM | POA: Diagnosis not present

## 2021-08-03 DIAGNOSIS — E785 Hyperlipidemia, unspecified: Secondary | ICD-10-CM | POA: Diagnosis present

## 2021-08-03 DIAGNOSIS — Z86711 Personal history of pulmonary embolism: Secondary | ICD-10-CM | POA: Diagnosis not present

## 2021-08-03 DIAGNOSIS — Z823 Family history of stroke: Secondary | ICD-10-CM

## 2021-08-03 DIAGNOSIS — N1831 Chronic kidney disease, stage 3a: Secondary | ICD-10-CM | POA: Diagnosis present

## 2021-08-03 DIAGNOSIS — F419 Anxiety disorder, unspecified: Secondary | ICD-10-CM | POA: Diagnosis present

## 2021-08-03 DIAGNOSIS — F32A Depression, unspecified: Secondary | ICD-10-CM | POA: Diagnosis not present

## 2021-08-03 DIAGNOSIS — I2693 Single subsegmental pulmonary embolism without acute cor pulmonale: Principal | ICD-10-CM | POA: Diagnosis present

## 2021-08-03 DIAGNOSIS — E039 Hypothyroidism, unspecified: Secondary | ICD-10-CM | POA: Diagnosis not present

## 2021-08-03 DIAGNOSIS — R531 Weakness: Secondary | ICD-10-CM | POA: Diagnosis not present

## 2021-08-03 DIAGNOSIS — Z7901 Long term (current) use of anticoagulants: Secondary | ICD-10-CM

## 2021-08-03 DIAGNOSIS — Z6820 Body mass index (BMI) 20.0-20.9, adult: Secondary | ICD-10-CM

## 2021-08-03 DIAGNOSIS — G4733 Obstructive sleep apnea (adult) (pediatric): Secondary | ICD-10-CM | POA: Diagnosis not present

## 2021-08-03 DIAGNOSIS — Z7189 Other specified counseling: Secondary | ICD-10-CM | POA: Diagnosis not present

## 2021-08-03 DIAGNOSIS — I2699 Other pulmonary embolism without acute cor pulmonale: Secondary | ICD-10-CM | POA: Diagnosis not present

## 2021-08-03 DIAGNOSIS — C7931 Secondary malignant neoplasm of brain: Secondary | ICD-10-CM | POA: Diagnosis present

## 2021-08-03 DIAGNOSIS — R5381 Other malaise: Secondary | ICD-10-CM

## 2021-08-03 DIAGNOSIS — C7800 Secondary malignant neoplasm of unspecified lung: Secondary | ICD-10-CM | POA: Diagnosis not present

## 2021-08-03 DIAGNOSIS — Z8673 Personal history of transient ischemic attack (TIA), and cerebral infarction without residual deficits: Secondary | ICD-10-CM

## 2021-08-03 DIAGNOSIS — Z66 Do not resuscitate: Secondary | ICD-10-CM | POA: Diagnosis present

## 2021-08-03 DIAGNOSIS — C3490 Malignant neoplasm of unspecified part of unspecified bronchus or lung: Secondary | ICD-10-CM | POA: Diagnosis not present

## 2021-08-03 DIAGNOSIS — R278 Other lack of coordination: Secondary | ICD-10-CM | POA: Diagnosis not present

## 2021-08-03 DIAGNOSIS — C3432 Malignant neoplasm of lower lobe, left bronchus or lung: Secondary | ICD-10-CM | POA: Diagnosis present

## 2021-08-03 DIAGNOSIS — Z20822 Contact with and (suspected) exposure to covid-19: Secondary | ICD-10-CM | POA: Diagnosis present

## 2021-08-03 DIAGNOSIS — Z8249 Family history of ischemic heart disease and other diseases of the circulatory system: Secondary | ICD-10-CM

## 2021-08-03 DIAGNOSIS — R0602 Shortness of breath: Secondary | ICD-10-CM | POA: Diagnosis not present

## 2021-08-03 DIAGNOSIS — K219 Gastro-esophageal reflux disease without esophagitis: Secondary | ICD-10-CM | POA: Diagnosis present

## 2021-08-03 DIAGNOSIS — Z888 Allergy status to other drugs, medicaments and biological substances status: Secondary | ICD-10-CM

## 2021-08-03 DIAGNOSIS — Z85118 Personal history of other malignant neoplasm of bronchus and lung: Secondary | ICD-10-CM | POA: Diagnosis not present

## 2021-08-03 LAB — CBC WITH DIFFERENTIAL/PLATELET
Abs Immature Granulocytes: 0.02 10*3/uL (ref 0.00–0.07)
Basophils Absolute: 0 10*3/uL (ref 0.0–0.1)
Basophils Relative: 0 %
Eosinophils Absolute: 0 10*3/uL (ref 0.0–0.5)
Eosinophils Relative: 0 %
HCT: 32.2 % — ABNORMAL LOW (ref 36.0–46.0)
Hemoglobin: 10.7 g/dL — ABNORMAL LOW (ref 12.0–15.0)
Immature Granulocytes: 0 %
Lymphocytes Relative: 15 %
Lymphs Abs: 0.8 10*3/uL (ref 0.7–4.0)
MCH: 30.1 pg (ref 26.0–34.0)
MCHC: 33.2 g/dL (ref 30.0–36.0)
MCV: 90.4 fL (ref 80.0–100.0)
Monocytes Absolute: 0.4 10*3/uL (ref 0.1–1.0)
Monocytes Relative: 7 %
Neutro Abs: 4.1 10*3/uL (ref 1.7–7.7)
Neutrophils Relative %: 78 %
Platelets: 89 10*3/uL — ABNORMAL LOW (ref 150–400)
RBC: 3.56 MIL/uL — ABNORMAL LOW (ref 3.87–5.11)
RDW: 15.2 % (ref 11.5–15.5)
WBC: 5.4 10*3/uL (ref 4.0–10.5)
nRBC: 0 % (ref 0.0–0.2)

## 2021-08-03 LAB — COMPREHENSIVE METABOLIC PANEL
ALT: 9 U/L (ref 0–44)
AST: 14 U/L — ABNORMAL LOW (ref 15–41)
Albumin: 3.8 g/dL (ref 3.5–5.0)
Alkaline Phosphatase: 303 U/L — ABNORMAL HIGH (ref 38–126)
Anion gap: 12 (ref 5–15)
BUN: 24 mg/dL — ABNORMAL HIGH (ref 8–23)
CO2: 20 mmol/L — ABNORMAL LOW (ref 22–32)
Calcium: 9.1 mg/dL (ref 8.9–10.3)
Chloride: 105 mmol/L (ref 98–111)
Creatinine, Ser: 0.85 mg/dL (ref 0.44–1.00)
GFR, Estimated: >60 mL/min (ref >60)
Glucose, Bld: 103 mg/dL — ABNORMAL HIGH (ref 70–99)
Potassium: 3.5 mmol/L (ref 3.5–5.1)
Sodium: 137 mmol/L (ref 135–145)
Total Bilirubin: 0.7 mg/dL (ref 0.3–1.2)
Total Protein: 6.6 g/dL (ref 6.5–8.1)

## 2021-08-03 LAB — TROPONIN I (HIGH SENSITIVITY): Troponin I (High Sensitivity): 20 ng/L — ABNORMAL HIGH (ref <18)

## 2021-08-03 NOTE — ED Triage Notes (Signed)
Pt reports with her son, with 2 day history of shortness of breath with activity. ? ?Pt has history of brain, lung, and pelvic cancer. ? ?Pt in wheelchair during triage, in NAD. ?

## 2021-08-04 ENCOUNTER — Observation Stay (HOSPITAL_COMMUNITY): Payer: Medicare Other

## 2021-08-04 ENCOUNTER — Emergency Department (HOSPITAL_BASED_OUTPATIENT_CLINIC_OR_DEPARTMENT_OTHER): Payer: Medicare Other

## 2021-08-04 ENCOUNTER — Observation Stay (HOSPITAL_BASED_OUTPATIENT_CLINIC_OR_DEPARTMENT_OTHER): Payer: Medicare Other

## 2021-08-04 ENCOUNTER — Other Ambulatory Visit: Payer: Self-pay

## 2021-08-04 ENCOUNTER — Encounter (HOSPITAL_BASED_OUTPATIENT_CLINIC_OR_DEPARTMENT_OTHER): Payer: Self-pay | Admitting: Radiology

## 2021-08-04 DIAGNOSIS — C7951 Secondary malignant neoplasm of bone: Secondary | ICD-10-CM | POA: Diagnosis not present

## 2021-08-04 DIAGNOSIS — Z6820 Body mass index (BMI) 20.0-20.9, adult: Secondary | ICD-10-CM | POA: Diagnosis not present

## 2021-08-04 DIAGNOSIS — I2699 Other pulmonary embolism without acute cor pulmonale: Secondary | ICD-10-CM

## 2021-08-04 DIAGNOSIS — I5043 Acute on chronic combined systolic (congestive) and diastolic (congestive) heart failure: Secondary | ICD-10-CM | POA: Diagnosis not present

## 2021-08-04 DIAGNOSIS — E039 Hypothyroidism, unspecified: Secondary | ICD-10-CM | POA: Diagnosis not present

## 2021-08-04 DIAGNOSIS — D696 Thrombocytopenia, unspecified: Secondary | ICD-10-CM

## 2021-08-04 DIAGNOSIS — F32A Depression, unspecified: Secondary | ICD-10-CM | POA: Diagnosis not present

## 2021-08-04 DIAGNOSIS — C7801 Secondary malignant neoplasm of right lung: Secondary | ICD-10-CM | POA: Diagnosis not present

## 2021-08-04 DIAGNOSIS — E440 Moderate protein-calorie malnutrition: Secondary | ICD-10-CM | POA: Diagnosis not present

## 2021-08-04 DIAGNOSIS — C7931 Secondary malignant neoplasm of brain: Secondary | ICD-10-CM | POA: Diagnosis not present

## 2021-08-04 DIAGNOSIS — Z20822 Contact with and (suspected) exposure to covid-19: Secondary | ICD-10-CM | POA: Diagnosis not present

## 2021-08-04 DIAGNOSIS — Z66 Do not resuscitate: Secondary | ICD-10-CM | POA: Diagnosis not present

## 2021-08-04 DIAGNOSIS — I2693 Single subsegmental pulmonary embolism without acute cor pulmonale: Secondary | ICD-10-CM | POA: Diagnosis not present

## 2021-08-04 DIAGNOSIS — I428 Other cardiomyopathies: Secondary | ICD-10-CM | POA: Diagnosis not present

## 2021-08-04 DIAGNOSIS — J90 Pleural effusion, not elsewhere classified: Secondary | ICD-10-CM | POA: Diagnosis not present

## 2021-08-04 DIAGNOSIS — Z86711 Personal history of pulmonary embolism: Secondary | ICD-10-CM | POA: Diagnosis not present

## 2021-08-04 DIAGNOSIS — I2609 Other pulmonary embolism with acute cor pulmonale: Secondary | ICD-10-CM | POA: Diagnosis not present

## 2021-08-04 DIAGNOSIS — N1831 Chronic kidney disease, stage 3a: Secondary | ICD-10-CM | POA: Diagnosis not present

## 2021-08-04 DIAGNOSIS — C3492 Malignant neoplasm of unspecified part of left bronchus or lung: Secondary | ICD-10-CM

## 2021-08-04 DIAGNOSIS — J9621 Acute and chronic respiratory failure with hypoxia: Secondary | ICD-10-CM | POA: Diagnosis not present

## 2021-08-04 DIAGNOSIS — C771 Secondary and unspecified malignant neoplasm of intrathoracic lymph nodes: Secondary | ICD-10-CM | POA: Diagnosis not present

## 2021-08-04 DIAGNOSIS — I951 Orthostatic hypotension: Secondary | ICD-10-CM | POA: Diagnosis not present

## 2021-08-04 DIAGNOSIS — F419 Anxiety disorder, unspecified: Secondary | ICD-10-CM | POA: Diagnosis not present

## 2021-08-04 DIAGNOSIS — I130 Hypertensive heart and chronic kidney disease with heart failure and stage 1 through stage 4 chronic kidney disease, or unspecified chronic kidney disease: Secondary | ICD-10-CM | POA: Diagnosis not present

## 2021-08-04 DIAGNOSIS — G4733 Obstructive sleep apnea (adult) (pediatric): Secondary | ICD-10-CM | POA: Diagnosis not present

## 2021-08-04 DIAGNOSIS — C3432 Malignant neoplasm of lower lobe, left bronchus or lung: Secondary | ICD-10-CM | POA: Diagnosis not present

## 2021-08-04 DIAGNOSIS — Z8673 Personal history of transient ischemic attack (TIA), and cerebral infarction without residual deficits: Secondary | ICD-10-CM | POA: Diagnosis not present

## 2021-08-04 LAB — ECHOCARDIOGRAM COMPLETE
Area-P 1/2: 5.58 cm2
Calc EF: 32.3 %
Height: 65 in
MV M vel: 4.14 m/s
MV Peak grad: 68.6 mmHg
Radius: 0.5 cm
S' Lateral: 4.95 cm
Single Plane A2C EF: 22.3 %
Single Plane A4C EF: 39.9 %
Weight: 2208 oz

## 2021-08-04 LAB — RESP PANEL BY RT-PCR (FLU A&B, COVID) ARPGX2
Influenza A by PCR: NEGATIVE
Influenza B by PCR: NEGATIVE
SARS Coronavirus 2 by RT PCR: NEGATIVE

## 2021-08-04 LAB — CBC
HCT: 34.9 % — ABNORMAL LOW (ref 36.0–46.0)
Hemoglobin: 11.7 g/dL — ABNORMAL LOW (ref 12.0–15.0)
MCH: 31 pg (ref 26.0–34.0)
MCHC: 33.5 g/dL (ref 30.0–36.0)
MCV: 92.3 fL (ref 80.0–100.0)
Platelets: 89 10*3/uL — ABNORMAL LOW (ref 150–400)
RBC: 3.78 MIL/uL — ABNORMAL LOW (ref 3.87–5.11)
RDW: 15.5 % (ref 11.5–15.5)
WBC: 6.5 10*3/uL (ref 4.0–10.5)
nRBC: 0 % (ref 0.0–0.2)

## 2021-08-04 LAB — BRAIN NATRIURETIC PEPTIDE: B Natriuretic Peptide: >4500 pg/mL — ABNORMAL HIGH (ref 0.0–100.0)

## 2021-08-04 LAB — HEPARIN LEVEL (UNFRACTIONATED)
Heparin Unfractionated: >1.1 IU/mL — ABNORMAL HIGH (ref 0.30–0.70)
Heparin Unfractionated: >1.1 IU/mL — ABNORMAL HIGH (ref 0.30–0.70)

## 2021-08-04 LAB — COMPREHENSIVE METABOLIC PANEL
ALT: 11 U/L (ref 0–44)
AST: 17 U/L (ref 15–41)
Albumin: 3.6 g/dL (ref 3.5–5.0)
Alkaline Phosphatase: 345 U/L — ABNORMAL HIGH (ref 38–126)
Anion gap: 11 (ref 5–15)
BUN: 23 mg/dL (ref 8–23)
CO2: 22 mmol/L (ref 22–32)
Calcium: 8.6 mg/dL — ABNORMAL LOW (ref 8.9–10.3)
Chloride: 103 mmol/L (ref 98–111)
Creatinine, Ser: 0.94 mg/dL (ref 0.44–1.00)
GFR, Estimated: >60 mL/min (ref >60)
Glucose, Bld: 128 mg/dL — ABNORMAL HIGH (ref 70–99)
Potassium: 3.4 mmol/L — ABNORMAL LOW (ref 3.5–5.1)
Sodium: 136 mmol/L (ref 135–145)
Total Bilirubin: 0.8 mg/dL (ref 0.3–1.2)
Total Protein: 6.9 g/dL (ref 6.5–8.1)

## 2021-08-04 LAB — APTT
aPTT: 33 seconds (ref 24–36)
aPTT: 200 seconds (ref 24–36)
aPTT: 155 seconds — ABNORMAL HIGH (ref 24–36)

## 2021-08-04 LAB — MAGNESIUM: Magnesium: 1.4 mg/dL — ABNORMAL LOW (ref 1.7–2.4)

## 2021-08-04 LAB — TROPONIN I (HIGH SENSITIVITY): Troponin I (High Sensitivity): 22 ng/L — ABNORMAL HIGH (ref <18)

## 2021-08-04 MED ORDER — PERFLUTREN LIPID MICROSPHERE
1.0000 mL | INTRAVENOUS | Status: AC | PRN
  Administered 2021-08-04: 3 mL via INTRAVENOUS
  Filled 2021-08-04: qty 10

## 2021-08-04 MED ORDER — HEPARIN (PORCINE) 25000 UT/250ML-% IV SOLN
900.0000 [IU]/h | INTRAVENOUS | Status: DC
  Filled 2021-08-04: qty 250

## 2021-08-04 MED ORDER — PANTOPRAZOLE SODIUM 40 MG PO TBEC
40.0000 mg | DELAYED_RELEASE_TABLET | Freq: Every evening | ORAL | Status: DC
  Administered 2021-08-04 – 2021-08-07 (×4): 40 mg via ORAL
  Filled 2021-08-04 (×4): qty 1

## 2021-08-04 MED ORDER — METOPROLOL SUCCINATE ER 50 MG PO TB24: 75.0000 mg | ORAL_TABLET | Freq: Every day | ORAL | Status: DC

## 2021-08-04 MED ORDER — HEPARIN BOLUS VIA INFUSION
2000.0000 [IU] | Freq: Once | INTRAVENOUS | Status: AC
  Administered 2021-08-04: 2000 [IU] via INTRAVENOUS

## 2021-08-04 MED ORDER — HEPARIN (PORCINE) 25000 UT/250ML-% IV SOLN
1050.0000 [IU]/h | INTRAVENOUS | Status: DC
  Administered 2021-08-04: 1050 [IU]/h via INTRAVENOUS
  Filled 2021-08-04: qty 250

## 2021-08-04 MED ORDER — FUROSEMIDE 10 MG/ML IJ SOLN
40.0000 mg | Freq: Every day | INTRAMUSCULAR | Status: DC
  Administered 2021-08-04: 40 mg via INTRAVENOUS
  Filled 2021-08-04: qty 4

## 2021-08-04 MED ORDER — ACETAMINOPHEN 650 MG RE SUPP: 650.0000 mg | Freq: Four times a day (QID) | RECTAL | Status: DC | PRN

## 2021-08-04 MED ORDER — FLUOXETINE HCL 20 MG PO CAPS
20.0000 mg | ORAL_CAPSULE | Freq: Every day | ORAL | Status: DC
  Administered 2021-08-04 – 2021-08-08 (×5): 20 mg via ORAL
  Filled 2021-08-04 (×5): qty 1

## 2021-08-04 MED ORDER — POLYVINYL ALCOHOL 1.4 % OP SOLN
1.0000 [drp] | Freq: Four times a day (QID) | OPHTHALMIC | Status: DC | PRN
  Filled 2021-08-04: qty 15

## 2021-08-04 MED ORDER — FUROSEMIDE 10 MG/ML IJ SOLN
40.0000 mg | Freq: Once | INTRAMUSCULAR | Status: AC
  Administered 2021-08-04: 40 mg via INTRAVENOUS
  Filled 2021-08-04: qty 4

## 2021-08-04 MED ORDER — POTASSIUM CHLORIDE CRYS ER 20 MEQ PO TBCR
40.0000 meq | EXTENDED_RELEASE_TABLET | Freq: Every day | ORAL | Status: DC
  Administered 2021-08-04 – 2021-08-08 (×5): 40 meq via ORAL
  Filled 2021-08-04 (×5): qty 2

## 2021-08-04 MED ORDER — ONDANSETRON HCL 4 MG/2ML IJ SOLN: 4.0000 mg | Freq: Four times a day (QID) | INTRAMUSCULAR | Status: DC | PRN

## 2021-08-04 MED ORDER — MAGNESIUM SULFATE 4 GM/100ML IV SOLN
4.0000 g | Freq: Once | INTRAVENOUS | Status: AC
  Administered 2021-08-04: 4 g via INTRAVENOUS
  Filled 2021-08-04: qty 100

## 2021-08-04 MED ORDER — LEVOTHYROXINE SODIUM 50 MCG PO TABS
75.0000 ug | ORAL_TABLET | Freq: Every day | ORAL | Status: DC
  Administered 2021-08-05 – 2021-08-08 (×4): 75 ug via ORAL
  Filled 2021-08-04 (×4): qty 1

## 2021-08-04 MED ORDER — ATORVASTATIN CALCIUM 10 MG PO TABS
10.0000 mg | ORAL_TABLET | Freq: Every day | ORAL | Status: DC
  Administered 2021-08-04 – 2021-08-08 (×5): 10 mg via ORAL
  Filled 2021-08-04 (×5): qty 1

## 2021-08-04 MED ORDER — SPIRONOLACTONE 12.5 MG HALF TABLET
12.5000 mg | ORAL_TABLET | Freq: Every day | ORAL | Status: DC
  Administered 2021-08-04 – 2021-08-05 (×2): 12.5 mg via ORAL
  Filled 2021-08-04 (×3): qty 1

## 2021-08-04 MED ORDER — LOSARTAN POTASSIUM 50 MG PO TABS: 50.0000 mg | ORAL_TABLET | Freq: Every day | ORAL | Status: DC

## 2021-08-04 MED ORDER — HEPARIN (PORCINE) 25000 UT/250ML-% IV SOLN
750.0000 [IU]/h | INTRAVENOUS | Status: DC
  Administered 2021-08-05: 750 [IU]/h via INTRAVENOUS
  Filled 2021-08-04: qty 250

## 2021-08-04 MED ORDER — IOHEXOL 350 MG/ML SOLN
100.0000 mL | Freq: Once | INTRAVENOUS | Status: AC | PRN
  Administered 2021-08-04: 75 mL via INTRAVENOUS

## 2021-08-04 MED ORDER — LEVETIRACETAM 250 MG PO TABS
250.0000 mg | ORAL_TABLET | Freq: Two times a day (BID) | ORAL | Status: DC
  Administered 2021-08-04 – 2021-08-08 (×8): 250 mg via ORAL
  Filled 2021-08-04 (×9): qty 1

## 2021-08-04 MED ORDER — FUROSEMIDE 10 MG/ML IJ SOLN
40.0000 mg | Freq: Two times a day (BID) | INTRAMUSCULAR | Status: DC
  Administered 2021-08-04 – 2021-08-05 (×2): 40 mg via INTRAVENOUS
  Filled 2021-08-04 (×2): qty 4

## 2021-08-04 MED ORDER — ONDANSETRON HCL 4 MG PO TABS: 4.0000 mg | ORAL_TABLET | Freq: Four times a day (QID) | ORAL | Status: DC | PRN

## 2021-08-04 MED ORDER — OXYCODONE HCL 5 MG PO TABS: 5.0000 mg | ORAL_TABLET | ORAL | Status: DC | PRN

## 2021-08-04 MED ORDER — ACETAMINOPHEN 325 MG PO TABS
650.0000 mg | ORAL_TABLET | Freq: Four times a day (QID) | ORAL | Status: DC | PRN
  Administered 2021-08-04 – 2021-08-08 (×4): 650 mg via ORAL
  Filled 2021-08-04 (×4): qty 2

## 2021-08-04 MED ORDER — HEPARIN (PORCINE) 25000 UT/250ML-% IV SOLN: 12.0000 [IU]/kg/h | INTRAVENOUS | Status: DC

## 2021-08-04 NOTE — Progress Notes (Signed)
?   08/04/21 1135  ?Provider Notification  ?Provider Name/Title Cherylann Ratel DO  ?Date Provider Notified 08/04/21  ?Time Provider Notified 1135  ?Method of Notification Page  ?Notification Reason Critical result  ?Test performed and critical result PTT of 200  ?Date Critical Result Received 08/04/21  ?Time Critical Result Received 1122 ?(provided to charge nurse)  ?Provider response See new orders  ?Date of Provider Response 08/04/21  ?Time of Provider Response 1144  ? ?Patient has a PTT of 200. Heparin is stopped at 1150, and will be off for the next hour for readjustment.  ?

## 2021-08-04 NOTE — Progress Notes (Addendum)
ANTICOAGULATION CONSULT NOTE ? ?Pharmacy Consult for IV heparin ?Indication: pulmonary embolus ? ?Allergies  ?Allergen Reactions  ? Sertraline Hcl Other (See Comments)  ?  Severe diarrhea  ? ? ?Patient Measurements: ?Height: 5\' 5"  (165.1 cm) ?Weight: 62.6 kg (138 lb) ?IBW/kg (Calculated) : 57 ?Heparin Dosing Weight: actual body weight  ? ?Vital Signs: ?Temp: 97.7 ?F (36.5 ?C) (03/31 1025) ?Temp Source: Oral (03/31 1025) ?BP: 114/69 (03/31 1025) ?Pulse Rate: 84 (03/31 1025) ? ?Labs: ?Recent Labs  ?  08/03/21 ?2319 08/04/21 ?0130 08/04/21 ?3875 08/04/21 ?6433  ?HGB 10.7*  --  11.7*  --   ?HCT 32.2*  --  34.9*  --   ?PLT 89*  --  89*  --   ?APTT  --  33  --  200*  ?HEPARINUNFRC  --  >1.10*  --  >1.10*  ?CREATININE 0.85  --   --  0.94  ?TROPONINIHS 20* 22*  --   --   ? ? ? ?Estimated Creatinine Clearance: 47.2 mL/min (by C-G formula based on SCr of 0.94 mg/dL). ? ? ?Medical History: ?Past Medical History:  ?Diagnosis Date  ? Acute CVA (cerebrovascular accident) ([place]) 01/09/2020  ? Adenocarcinoma of left lung, stage 4 ([place]) 01/18/2020  ? Anxiety   ? Blood clots in brain   ? Chronic kidney disease due to hypertension 05/16/2020  ? Chronic kidney disease, stage 3a ([place]) 05/16/2020  ? Chronic respiratory failure ([place]) 05/16/2020  ? Cough 03/18/2020  ? Daytime somnolence 05/16/2020  ? Dizziness 09/25/2019  ? Dyslipidemia   ? Encounter for antineoplastic chemotherapy 01/18/2020  ? Essential hypertension   ? Gastro-esophageal reflux disease without esophagitis   ? Generalized weakness 03/18/2020  ? GERD (gastroesophageal reflux disease)   ? Goals of care, counseling/discussion 01/18/2020  ? Hardening of the aorta (main artery of the heart) ([place]) 09/08/2020  ? Hyperlipidemia 05/16/2020  ? Hypothyroidism   ? Hypothyroidism, unspecified   ? Insomnia 05/16/2020  ? Intracranial bleeding ([place]) 01/09/2020  ? Left bundle branch block 06/04/2018  ? 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 Long term (current) use of anticoagulants 05/16/2020  ?  Lumbar foraminal stenosis 12/30/2019  ? Lung mass 05/16/2020  ? 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 Major depression, single episode 05/16/2020  ? Malignant neoplasm of lower lobe, left bronchus or lung ([place]) 01/14/2020  ? NICM (nonischemic cardiomyopathy) ([place])   ? Nonischemic cardiomyopathy ([place]) 06/04/2018  ? Ejection fraction 45% in summer 2019  ? Nonobstructive cardiomyopathy ([place])   ? Obstructive sleep apnea syndrome   ? Recurrent falls 07/12/2020  ? Rosacea 05/16/2020  ? 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[place]) 01/28/2020  ? 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 Single subsegmental pulmonary embolism without acute cor pulmonale ([place]) 03/25/2020  ? Sleep apnea   ? Stress incontinence (female) (female) 05/16/2020  ? TIA (transient ischemic attack) 01/08/2020  ? Vertigo 09/25/2019  ? ? ?Assessment: 76 yo female with metastatic lung cancer found to have new PE. Patient on Apixaban PTA for hx of VTE, with last dose 08/03/21 @ 0730. Platelets chronically low. Pharmacy consulted for IV heparin dosing. Baseline heparin level > 1.1, falsely elevated as expected due to recent Apixaban. Baseline aPTT 33 seconds. CBC: Hgb 10.7, Pltc 89K.  ? ?Today, 08/04/21: ?AM heparin level > 1.1, remains falsely elevated due to recent Apixaban use ?AM aPTT 200 seconds, supratherapeutic on heparin infusion at 1050 units/hr ?Per discussion  with RN, heparin running at specified rate. Heparin infusing in right AC, and heparin level collected from left hand.  ?No bleeding or infusion issues noted per nursing ?CBC: Hgb 11.7, Pltc low but remains unchanged at 89K ? ?Goal of Therapy:  ?Heparin level 0.3-0.7 units/ml ?Monitor platelets by anticoagulation protocol: Yes ?  ?Plan:  ?Stop heparin infusion x 1 hour, then resume at reduced rate of 900 units/hr. Discussed with RN. ?Will use aPTT for dose titration/monitoring until  effects of Apixaban on heparin level have diminished ?aPTT 8 hours after rate change ?Daily CBC, heparin level, aPTT ?Per oncology, ok to continue heparin as long as Pltc at least 50K ?Monitor very closely for s/sx of bleeding ?Noted oncology's recommendation to switch to Enoxaparin at discharge ? ? ?Lindell Spar, PharmD, BCPS ?Clinical Pharmacist  ?08/04/2021,11:50 AM ? ? ?

## 2021-08-04 NOTE — Progress Notes (Signed)
Bilateral lower extremity venous duplex has been completed. ?Preliminary results can be found in CV Proc through chart review.  ? ?08/04/21 8:58 AM ?Carlos Levering RVT   ?

## 2021-08-04 NOTE — ED Provider Notes (Signed)
?[facility] ?Provider Note ? ? ?CSN: [id number] ?Arrival date & time: 08/03/21  2247 ? ?  ? ?History ? ?Chief Complaint  ?Patient presents with  ? Shortness of Breath  ? ? ?Stefanie Camacho is a 76 y.o. female. ? ?The history is provided by the patient and a relative.  ?Shortness of Breath ?Severity:  Severe ?Onset quality:  Gradual ?Duration:  2 days ?Timing:  Constant ?Progression:  Worsening ?Chronicity:  New ?Context: not URI   ?Relieved by:  Nothing ?Worsened by:  Nothing ?Ineffective treatments:  None tried ?Associated symptoms: no abdominal pain, no claudication and no fever   ?Risk factors: hx of cancer   ?Risk factors: no oral contraceptive use   ?Patient with adenocarcinoma of the lung and brain and pelvic tumors presents with 2 days of DOE and worsening SOB.  No f/c/r.  Was also found to have a humeral lesion this month.   ?  ? ?Home Medications ?Prior to Admission medications   ?Medication Sig Start Date End Date Taking? Authorizing Provider  ?apixaban (ELIQUIS) 5 MG TABS tablet Take 1 tablet (5 mg total) by mouth 2 (two) times daily. 05/30/20   Park Liter, MD  ?atorvastatin (LIPITOR) 10 MG tablet Take 10 mg by mouth daily. 06/22/21   [provider]  ?FLUoxetine (PROZAC) 20 MG capsule Take 20 mg by mouth daily. 04/12/20   [provider]  ?levETIRAcetam (KEPPRA) 250 MG tablet Take 250 mg by mouth 2 (two) times daily. 08/30/20   [provider]  ?levothyroxine (SYNTHROID) 75 MCG tablet Take 1 tablet (75 mcg total) by mouth daily. 01/14/20   Hosie Poisson, MD  ?loperamide (IMODIUM) 2 MG capsule Take 2 mg by mouth as needed for diarrhea or loose stools.    [provider]  ?losartan (COZAAR) 50 MG tablet Take 1 tablet (50 mg total) by mouth daily. 09/13/20 06/28/21  Park Liter, MD  ?metoprolol succinate (TOPROL-XL) 25 MG 24 hr tablet Take 25 mg by mouth daily. 12/21/20   [provider]  ?osimertinib mesylate (TAGRISSO) 80 MG  tablet Take 2 tablets (160 mg total) by mouth daily. 06/01/21   Heilingoetter, Cassandra L, PA-C  ?pantoprazole (PROTONIX) 40 MG tablet Take 1 tablet by mouth once daily ?Patient taking differently: Take 40 mg by mouth every evening. 05/08/20   Heilingoetter, Cassandra L, PA-C  ?prochlorperazine (COMPAZINE) 10 MG tablet Take 1 tablet (10 mg total) by mouth every 6 (six) hours as needed for nausea or vomiting. ?Patient not taking: Reported on 05/30/2021 01/26/20   Harle Stanford., PA-C  ?spironolactone (ALDACTONE) 25 MG tablet Take 25 mg by mouth daily.    [provider]  ?   ? ?Allergies    ?Sertraline hcl   ? ?Review of Systems   ?Review of Systems  ?Constitutional:  Negative for fever.  ?HENT:  Negative for facial swelling.   ?Eyes:  Negative for redness.  ?Respiratory:  Positive for shortness of breath.   ?Cardiovascular:  Negative for claudication.  ?Gastrointestinal:  Negative for abdominal pain.  ?Neurological:  Negative for facial asymmetry.  ?All other systems reviewed and are negative. ? ?Physical Exam ?Updated Vital Signs ?BP 117/74   Pulse 90   Temp 98.4 ?F (36.9 ?C) (Oral)   Resp 20   Ht 5\' 5"  (1.651 m)   Wt 62.6 kg   SpO2 98%   BMI 22.96 kg/m?  ?Physical Exam ?Vitals and nursing note reviewed.  ?Constitutional:   ?   Appearance:  Normal appearance. She is not diaphoretic.  ?HENT:  ?   Head: Normocephalic and atraumatic.  ?   Nose: Nose normal.  ?Eyes:  ?   Conjunctiva/sclera: Conjunctivae normal.  ?   Pupils: Pupils are equal, round, and reactive to light.  ?Cardiovascular:  ?   Rate and Rhythm: Normal rate and regular rhythm.  ?   Pulses: Normal pulses.  ?   Heart sounds: Normal heart sounds.  ?Pulmonary:  ?   Breath sounds: Wheezing and rales present.  ?Abdominal:  ?   General: Bowel sounds are normal.  ?   Palpations: Abdomen is soft.  ?   Tenderness: There is no abdominal tenderness. There is no guarding.  ?Musculoskeletal:  ?   Cervical back: Normal range of motion and neck supple.   ?Skin: ?   General: Skin is warm and dry.  ?   Capillary Refill: Capillary refill takes less than 2 seconds.  ?Neurological:  ?   General: No focal deficit present.  ?   Mental Status: She is alert and oriented to person, place, and time.  ?   Deep Tendon Reflexes: Reflexes normal.  ?Psychiatric:     ?   Mood and Affect: Mood normal.     ?   Behavior: Behavior normal.  ? ? ?ED Results / Procedures / Treatments   ?Labs ?(all labs ordered are listed, but only abnormal results are displayed) ?Results for orders placed or performed during the hospital encounter of 08/03/21  ?Resp Panel by RT-PCR (Flu A&B, Covid) Nasopharyngeal Swab  ? Specimen: Nasopharyngeal Swab; Nasopharyngeal(NP) swabs in vial transport medium  ?Result Value Ref Range  ? SARS Coronavirus 2 by RT PCR NEGATIVE NEGATIVE  ? Influenza A by PCR NEGATIVE NEGATIVE  ? Influenza B by PCR NEGATIVE NEGATIVE  ?Comprehensive metabolic panel  ?Result Value Ref Range  ? Sodium 137 135 - 145 mmol/L  ? Potassium 3.5 3.5 - 5.1 mmol/L  ? Chloride 105 98 - 111 mmol/L  ? CO2 20 (L) 22 - 32 mmol/L  ? Glucose, Bld 103 (H) 70 - 99 mg/dL  ? BUN 24 (H) 8 - 23 mg/dL  ? Creatinine, Ser 0.85 0.44 - 1.00 mg/dL  ? Calcium 9.1 8.9 - 10.3 mg/dL  ? Total Protein 6.6 6.5 - 8.1 g/dL  ? Albumin 3.8 3.5 - 5.0 g/dL  ? AST 14 (L) 15 - 41 U/L  ? ALT 9 0 - 44 U/L  ? Alkaline Phosphatase 303 (H) 38 - 126 U/L  ? Total Bilirubin 0.7 0.3 - 1.2 mg/dL  ? GFR, Estimated >60 >60 mL/min  ? Anion gap 12 5 - 15  ?CBC with Differential/Platelet  ?Result Value Ref Range  ? WBC 5.4 4.0 - 10.5 K/uL  ? RBC 3.56 (L) 3.87 - 5.11 MIL/uL  ? Hemoglobin 10.7 (L) 12.0 - 15.0 g/dL  ? HCT 32.2 (L) 36.0 - 46.0 %  ? MCV 90.4 80.0 - 100.0 fL  ? MCH 30.1 26.0 - 34.0 pg  ? MCHC 33.2 30.0 - 36.0 g/dL  ? RDW 15.2 11.5 - 15.5 %  ? Platelets 89 (L) 150 - 400 K/uL  ? nRBC 0.0 0.0 - 0.2 %  ? Neutrophils Relative % 78 %  ? Neutro Abs 4.1 1.7 - 7.7 K/uL  ? Lymphocytes Relative 15 %  ? Lymphs Abs 0.8 0.7 - 4.0 K/uL  ? Monocytes  Relative 7 %  ? Monocytes Absolute 0.4 0.1 - 1.0 K/uL  ? Eosinophils Relative 0 %  ?  Eosinophils Absolute 0.0 0.0 - 0.5 K/uL  ? Basophils Relative 0 %  ? Basophils Absolute 0.0 0.0 - 0.1 K/uL  ? Immature Granulocytes 0 %  ? Abs Immature Granulocytes 0.02 0.00 - 0.07 K/uL  ?Troponin I (High Sensitivity)  ?Result Value Ref Range  ? Troponin I (High Sensitivity) 20 (H) <18 ng/L  ? ?DG Chest 2 View ? ?Result Date: 08/03/2021 ?CLINICAL DATA:  Shortness of breath.  Metastatic lung cancer. EXAM: CHEST - 2 VIEW COMPARISON:  CT dated 05/19/2021. FINDINGS: Moderate left pleural effusion and left lung base atelectasis or infiltrate. There is diffuse interstitial and vascular prominence which may represent edema, or pneumonia. Metastatic disease is not excluded. No pneumothorax. The cardiac borders are silhouetted. Degenerative changes of the spine. Bilateral humeral sclerotic lesions consistent with metastatic disease. No acute osseous pathology. IMPRESSION: 1. Moderate left pleural effusion and left lung base atelectasis or infiltrate. 2. Diffuse interstitial and vascular prominence may represent edema, or pneumonia. Electronically Signed   By: Anner Crete M.D.   On: 08/03/2021 23:40  ? ?DG Shoulder Right ? ?Result Date: 08/01/2021 ?CLINICAL DATA:  Right shoulder pain, no known injury. EXAM: RIGHT SHOULDER - 2+ VIEW COMPARISON:  None. FINDINGS: There is no evidence of fracture or dislocation. Osteopenia with mixed lytic/sclerotic appearance of the osseous structures. Mild glenohumeral and moderate acromioclavicular osteoarthritis. High-riding humeral head concerning for rotator cuff arthropathy. IMPRESSION: 1. Diffuse osteopenia. Mixed lytic/sclerotic appearance of the osseous structures, which may be secondary to metastatic disease. No acute fracture. 2. Mild glenohumeral and moderate acromioclavicular osteoarthritis. 3. High-riding humeral head concerning for rotator cuff arthropathy. Electronically Signed   By: Keane Police D.O.   On: 08/01/2021 10:45  ? ?CT Angio Chest PE W and/or Wo Contrast ? ?Result Date: 08/04/2021 ?CLINICAL DATA:  Chest pain or SOB, pleurisy or effusion suspected. Dyspnea on exertion. Metastatic lung cancer.

## 2021-08-04 NOTE — Progress Notes (Signed)
?  Echocardiogram ?2D Echocardiogram has been performed. ? Stefanie Camacho ?08/04/2021, 2:46 PM ?

## 2021-08-04 NOTE — ED Notes (Signed)
Patient transported to CT 

## 2021-08-04 NOTE — H&P (Signed)
?History and Physical  ? ? ?Patient: Stefanie Camacho EVO:[id number] DOB: 02-08-1946 ?DOA: 08/03/2021 ?DOS: the patient was seen and examined on 08/04/2021 ?PCP: [name], MD  ?Patient coming from: Home ? ?Chief Complaint:  ?Chief Complaint  ?Patient presents with  ? Shortness of Breath  ? ?HPI: Stefanie Camacho is a 76 y.o. female with medical history significant of non-ischemic cardiomyopathy, S4 NSCLC of LLL, anxiety, hypothyroidism, pulmonary embolism. Presenting with dyspnea. She reports that she's had 7 days of fatigue and shortness of breath. She thought it may be related to some shoulder pain, so she saw her orthopedic team. They found a concerning shoulder lesion on XR and directed her to her onco team. In the days since, her dyspnea has worsened. She didn't try any medications or therapies for her symptoms. When her dyspnea did not improve last night, she decided to come to the ED for help. She denies any other aggravating or alleviating factors.   ? ?Review of Systems: As mentioned in the history of present illness. All other systems reviewed and are negative. ?Past Medical History:  ?Diagnosis Date  ? Acute CVA (cerebrovascular accident) ([place]) 01/09/2020  ? Adenocarcinoma of left lung, stage 4 ([place]) 01/18/2020  ? Anxiety   ? Blood clots in brain   ? Chronic kidney disease due to hypertension 05/16/2020  ? Chronic kidney disease, stage 3a ([place]) 05/16/2020  ? Chronic respiratory failure ([place]) 05/16/2020  ? Cough 03/18/2020  ? Daytime somnolence 05/16/2020  ? Dizziness 09/25/2019  ? Dyslipidemia   ? Encounter for antineoplastic chemotherapy 01/18/2020  ? Essential hypertension   ? Gastro-esophageal reflux disease without esophagitis   ? Generalized weakness 03/18/2020  ? GERD (gastroesophageal reflux disease)   ? Goals of care, counseling/discussion 01/18/2020  ? Hardening of the aorta (main artery of the heart) ([place]) 09/08/2020  ? Hyperlipidemia 05/16/2020  ? Hypothyroidism   ? Hypothyroidism, unspecified   ? Insomnia  05/16/2020  ? Intracranial bleeding ([place]) 01/09/2020  ? Left bundle branch block 06/04/2018  ? 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 Long term (current) use of anticoagulants 05/16/2020  ? Lumbar foraminal stenosis 12/30/2019  ? Lung mass 05/16/2020  ? 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[place]) 05/16/2020  ? Major depression, single episode 05/16/2020  ? Malignant neoplasm of lower lobe, left bronchus or lung ([place]) 01/14/2020  ? NICM (nonischemic cardiomyopathy) ([place])   ? Nonischemic cardiomyopathy ([place]) 06/04/2018  ? Ejection fraction 45% in summer 2019  ? Nonobstructive cardiomyopathy ([place])   ? Obstructive sleep apnea syndrome   ? Recurrent falls 07/12/2020  ? Rosacea 05/16/2020  ? 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[place]) 01/28/2020  ? 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 Single subsegmental pulmonary embolism without acute cor pulmonale ([place]) 03/25/2020  ? Sleep apnea   ? Stress incontinence (female) (female) 05/16/2020  ? TIA (transient ischemic attack) 01/08/2020  ? Vertigo 09/25/2019  ? ?Past Surgical History:  ?Procedure Laterality Date  ? ABDOMINAL HYSTERECTOMY    ? BLADDER NECK RECONSTRUCTION    ? BRONCHIAL BIOPSY  01/12/2020  ? Procedure: BRONCHIAL BIOPSIES;  Surgeon: Garner Nash, DO;  Location: [facility];  Service: Pulmonary;;  ? BRONCHIAL BRUSHINGS  01/12/2020  ? Procedure: BRONCHIAL BRUSHINGS;  Surgeon: Garner Nash, DO;  Location: [facility];  Service: Pulmonary;;  ? BRONCHIAL NEEDLE ASPIRATION BIOPSY  01/12/2020  ? Procedure: BRONCHIAL NEEDLE ASPIRATION BIOPSIES;  Surgeon: Garner Nash, DO;  Location: [facility];  Service: Pulmonary;;  ? BRONCHIAL WASHINGS  01/12/2020  ? Procedure: BRONCHIAL WASHINGS;  Surgeon: Garner Nash, DO;  Location: [facility];  Service: Pulmonary;;  ? SHOULDER SURGERY    ? TONSILLECTOMY    ? VIDEO BRONCHOSCOPY WITH ENDOBRONCHIAL ULTRASOUND  01/12/2020  ? Procedure:  VIDEO BRONCHOSCOPY WITH ENDOBRONCHIAL ULTRASOUND;  Surgeon: Garner Nash, DO;  Location: [facility];  Service: Pulmonary;;  ? ?Social History:  reports that she has never smoked. She has never been exposed to tobacco smoke. She has never used smokeless tobacco. She reports that she does not currently use alcohol. She reports that she does not use drugs. ? ?Allergies  ?Allergen Reactions  ? Sertraline Hcl Other (See Comments)  ?  Severe diarrhea  ? ? ?Family History  ?Problem Relation Age of Onset  ? Heart disease Mother   ? Stroke Mother   ? Heart disease Father   ? Stroke Father   ? Heart disease Maternal Grandfather   ? Heart disease Paternal Grandfather   ? ? ?Prior to Admission medications   ?Medication Sig Start Date End Date Taking? Authorizing Provider  ?apixaban (ELIQUIS) 5 MG TABS tablet Take 1 tablet (5 mg total) by mouth 2 (two) times daily. 05/30/20   Park Liter, MD  ?atorvastatin (LIPITOR) 10 MG tablet Take 10 mg by mouth daily. 06/22/21   [provider]  ?FLUoxetine (PROZAC) 20 MG capsule Take 20 mg by mouth daily. 04/12/20   [provider]  ?levETIRAcetam (KEPPRA) 250 MG tablet Take 250 mg by mouth 2 (two) times daily. 08/30/20   [provider]  ?levothyroxine (SYNTHROID) 75 MCG tablet Take 1 tablet (75 mcg total) by mouth daily. 01/14/20   Hosie Poisson, MD  ?loperamide (IMODIUM) 2 MG capsule Take 2 mg by mouth as needed for diarrhea or loose stools.    [provider]  ?losartan (COZAAR) 50 MG tablet Take 1 tablet (50 mg total) by mouth daily. 09/13/20 06/28/21  Park Liter, MD  ?metoprolol succinate (TOPROL-XL) 25 MG 24 hr tablet Take 25 mg by mouth daily. 12/21/20   [provider]  ?osimertinib mesylate (TAGRISSO) 80 MG tablet Take 2 tablets (160 mg total) by mouth daily. 06/01/21   Heilingoetter, Cassandra L, PA-C  ?pantoprazole (PROTONIX) 40 MG tablet Take 1 tablet by mouth once daily ?Patient taking differently: Take 40 mg by mouth  every evening. 05/08/20   Heilingoetter, Cassandra L, PA-C  ?prochlorperazine (COMPAZINE) 10 MG tablet Take 1 tablet (10 mg total) by mouth every 6 (six) hours as needed for nausea or vomiting. ?Patient not taking: Reported on 05/30/2021 01/26/20   Harle Stanford., PA-C  ?spironolactone (ALDACTONE) 25 MG tablet Take 25 mg by mouth daily.    [provider]  ? ? ?Physical Exam: ?Vitals:  ? 08/04/21 0156 08/04/21 0200 08/04/21 0215 08/04/21 0349  ?BP: 118/87  106/87 121/80  ?Pulse: 100 (!) 101 100 (!) 101  ?Resp: (!) 22 (!) 26 (!) 26 18  ?Temp:    97.8 ?F (36.6 ?C)  ?TempSrc:    Oral  ?SpO2: 92% 95% 94% (!) 89%  ?Weight:      ?Height:      ? ?General: 76 y.o. female resting in bed in NAD ?Eyes: PERRL, normal sclera ?ENMT: Nares patent w/o discharge, orophaynx clear, dentition normal, ears w/o discharge/lesions/ulcers ?Neck: Supple, trachea midline ?Cardiovascular: RRR, +S1, S2, no m/g/r, equal pulses throughout ?Respiratory: decreased, no w/r/r, increased WOB ?GI: BS+, NDNT, no masses noted, no  organomegaly noted ?MSK: No e/c/c ?Neuro: A&O x 3, no focal deficits ?Psyc: Appropriate interaction and affect, calm/cooperative ? ?Data Reviewed: ? ?Alk phos  303 ?Troponin 20 -> 22 ?Hgb 10.7 ?Plt  89 ? ?CTA chest PE: Acute subsegmental pulmonary embolus within the right lower lobe. Embolic burden is tiny. No CT evidence of right heart strain.  ?Interval development of extensive interstitial and airspace infiltrate most suggestive of moderate pulmonary edema with associated bilateral pleural effusions, left greater than right. Associated subtotal collapse of the left lower lobe. Note that superimposed acute inflammatory or infectious infiltrate, however, could appear similarly. ?Progressive pulmonary metastatic disease. Widespread osseous metastatic disease again noted. ?Mild coronary artery calcification. Cardiomegaly. Morphologic changes in keeping with pulmonary arterial hypertension. ?Aortic Atherosclerosis  (ICD10-I70.0). ? ?Assessment and Plan: ?No notes have been filed under this hospital service. ?Service: Hospitalist ?Acute subsegmental pulmonary embolus within the right lower lobe ?    - place in obs, progressive

## 2021-08-04 NOTE — Progress Notes (Signed)
ANTICOAGULATION CONSULT NOTE ? ?Pharmacy Consult for IV heparin ?Indication: pulmonary embolus ? ?Allergies  ?Allergen Reactions  ? Sertraline Hcl Other (See Comments)  ?  Severe diarrhea  ? ? ?Patient Measurements: ?Height: 5\' 5"  (165.1 cm) ?Weight: 62.6 kg (138 lb) ?IBW/kg (Calculated) : 57 ?Heparin Dosing Weight: actual body weight  ? ?Vital Signs: ?Temp: 98.4 ?F (36.9 ?C) (03/31 1921) ?Temp Source: Oral (03/31 1921) ?BP: 96/58 (03/31 1921) ?Pulse Rate: 83 (03/31 1921) ? ?Labs: ?Recent Labs  ?  08/03/21 ?2319 08/04/21 ?0130 08/04/21 ?9147 08/04/21 ?8295 08/04/21 ?2032  ?HGB 10.7*  --  11.7*  --   --   ?HCT 32.2*  --  34.9*  --   --   ?PLT 89*  --  89*  --   --   ?APTT  --  33  --  200* 155*  ?HEPARINUNFRC  --  >1.10*  --  >1.10*  --   ?CREATININE 0.85  --   --  0.94  --   ?TROPONINIHS 20* 22*  --   --   --   ? ? ? ?Estimated Creatinine Clearance: 47.2 mL/min (by C-G formula based on SCr of 0.94 mg/dL). ? ? ?Medical History: ?Past Medical History:  ?Diagnosis Date  ? Acute CVA (cerebrovascular accident) ([place]) 01/09/2020  ? Adenocarcinoma of left lung, stage 4 ([place]) 01/18/2020  ? Anxiety   ? Blood clots in brain   ? Chronic kidney disease due to hypertension 05/16/2020  ? Chronic kidney disease, stage 3a ([place]) 05/16/2020  ? Chronic respiratory failure ([place]) 05/16/2020  ? Cough 03/18/2020  ? Daytime somnolence 05/16/2020  ? Dizziness 09/25/2019  ? Dyslipidemia   ? Encounter for antineoplastic chemotherapy 01/18/2020  ? Essential hypertension   ? Gastro-esophageal reflux disease without esophagitis   ? Generalized weakness 03/18/2020  ? GERD (gastroesophageal reflux disease)   ? Goals of care, counseling/discussion 01/18/2020  ? Hardening of the aorta (main artery of the heart) ([place]) 09/08/2020  ? Hyperlipidemia 05/16/2020  ? Hypothyroidism   ? Hypothyroidism, unspecified   ? Insomnia 05/16/2020  ? Intracranial bleeding ([place]) 01/09/2020  ? Left bundle branch block 06/04/2018  ? 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 Long term  (current) use of anticoagulants 05/16/2020  ? Lumbar foraminal stenosis 12/30/2019  ? Lung mass 05/16/2020  ? 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[place]) 05/16/2020  ? Major depression, single episode 05/16/2020  ? Malignant neoplasm of lower lobe, left bronchus or lung ([place]) 01/14/2020  ? NICM (nonischemic cardiomyopathy) ([place])   ? Nonischemic cardiomyopathy ([place]) 06/04/2018  ? Ejection fraction 45% in summer 2019  ? Nonobstructive cardiomyopathy ([place])   ? Obstructive sleep apnea syndrome   ? Recurrent falls 07/12/2020  ? Rosacea 05/16/2020  ? 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[place]) 01/28/2020  ? 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 Single subsegmental pulmonary embolism without acute cor pulmonale ([place]) 03/25/2020  ? Sleep apnea   ? Stress incontinence (female) (female) 05/16/2020  ? TIA (transient ischemic attack) 01/08/2020  ? Vertigo 09/25/2019  ? ? ?Assessment: 76 yo female with metastatic lung cancer found to have new PE. Patient on Apixaban PTA for hx of VTE, with last dose 08/03/21 @ 0730. Platelets chronically low. Pharmacy consulted for IV heparin dosing. Baseline heparin level > 1.1, falsely elevated as expected due to recent Apixaban. Baseline aPTT 33 seconds. CBC: Hgb 10.7, Pltc 89K.  ? ?Today, 08/04/21: ?AM heparin level > 1.1,  remains falsely elevated due to recent Apixaban use ?PM aPTT 155 seconds, supratherapeutic on heparin infusion at 900 units/hr ?Per discussion with RN, heparin running at specified rate. Heparin infusing in right AC, and heparin level collected from left hand.  ?No bleeding or infusion issues noted per nursing ?CBC: Hgb 11.7, Pltc low but remains unchanged at 89K ? ?Goal of Therapy:  ?Heparin level 0.3-0.7 units/ml ?Monitor platelets by anticoagulation protocol: Yes ?  ?Plan:  ?Stop heparin infusion x 1 hour, then resume at reduced rate of 750 units/hr. Discussed with RN. ?Will use  aPTT for dose titration/monitoring until effects of Apixaban on heparin level have diminished ?aPTT 8 hours after rate change ?Daily CBC, heparin level, aPTT ?Per oncology, ok to continue heparin as long as Pltc at least 50K ?Monitor very closely for s/sx of bleeding ?Noted oncology's recommendation to switch to Enoxaparin at discharge ? ?Dimple Nanas, PharmD ?08/04/2021 9:05 PM ? ?

## 2021-08-04 NOTE — Progress Notes (Addendum)
HEMATOLOGY-ONCOLOGY PROGRESS NOTE ? 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 ?She underwent whole brain irradiation and currently undergoing treatment with targeted therapy with Tagrisso 160 mg p.o. daily because of the leptomeningeal disease status post approximately 18 months of treatment.  ?The patient has been tolerating this treatment well with no concerning adverse effect except for few episodes of diarrhea improved with Imodium. ? ?She is now admitted with shortness of breath.  CTA chest showed an acute PE.  She has been on Eliquis with no missed doses to her knowledge.  Recommend continuation of heparin.  Okay to continue as long as platelet count remains 50,000 or higher.  Would switch to Lovenox upon hospital discharge due to failure of Eliquis. ? ?Discussed evidence of disease progression with the patient today.  We will plan to repeat molecular studies to see if she has any new actionable mutations.  If she does not, then she would need to be considered for systemic chemotherapy.  Further recommendations per Dr. Julien Nordmann. ? ?Mikey Bussing, DNP, AGPCNP-BC, AOCNP ? ?SUBJECTIVE: Stefanie Camacho is followed by our office for stage IV non-small cell lung cancer.  She has been receiving treatment with Tagrisso 160 mg daily which was started on 02/01/2020.  She is overall tolerated this medication well.  She is also on Eliquis 5 mg twice daily for history of PE.  She has known leptomeningeal disease and brain metastasis and is followed by Dr. Mickeal Skinner.   She presented to the hospital with shortness of breath.  CTA chest showed acute subsegmental PE within the right lower lobe, no right heart strain.  She also has development of extensive interstitial and airspace infiltrate most suggestive of moderate pulmonary edema with associated bilateral pleural effusions, subtotal collapse of the left lower lobe, progressive pulmonary metastatic disease with widespread osseous metastatic disease.  She has been started on a heparin drip. ? ?The patient reports that she has been taking the Eliquis twice a day to her knowledge.  She lives in assisted living and medications are provided by staff.  She has been on Tagrisso with no missed doses.  Reports some mild discomfort in her right shoulder and right bicep.  She is not having any headaches or dizziness today.  She is not having any chest pain.  She reports shortness of breath which has improved somewhat since being placed on oxygen. ? ?Oncology History  ?Malignant neoplasm of lower lobe, left bronchus or lung ([place])  ?01/14/2020 Initial Diagnosis  ? Malignant neoplasm of lower lobe, left bronchus or lung ([place]) ?  ?07/26/2020 Cancer Staging  ? Staging form: Lung, AJCC 8th Edition ?- Clinical: Stage IVB (cT3, cN2, cM1c) - Signed by Curt Bears, MD on 07/26/2020 ? ?  ?Adenocarcinoma of left lung, stage 4 ([place])  ?01/18/2020 Initial Diagnosis  ? Adenocarcinoma of left lung, stage 4 ([place]) ?  ?01/28/2020 -  Chemotherapy  ? The patient had [No matching medication found in this treatment plan] ? for chemotherapy treatment.  ? ?  ?05/25/2020 Cancer Staging  ? Staging form: Lung, AJCC 8th Edition ?- Clinical:  Stage IVB (cT3, cN2, cM1c) - Signed by Curt Bears, MD on 05/25/2020 ? ?  ? ? ? ?REVIEW OF SYSTEMS:   ?Review of Systems  ?Constitutional:  Negative for chills and fever.  ?HENT: Negative.    ?Respiratory:  Positive for shortness of breath.   ?Cardiovascular: Negative.   ?Gastrointestinal: Negative.   ?Skin: Negative.    ?Neurological: Negative.   ?Psychiatric/Behavioral: Negative.    ? ?I have reviewed the past medical history, past surgical history, social history and family history with the patient and they are unchanged from previous note. ? ? ?PHYSICAL EXAMINATION: ?ECOG PERFORMANCE STATUS: 2 - Symptomatic, <50% confined to bed ? ?Vitals:  ? 08/04/21 0349 08/04/21 1025  ?BP: 121/80 114/69  ?Pulse: (!) 101 84  ?Resp: 18 20  ?Temp: 97.8 ?F (36.6 ?C) 97.7 ?F (36.5 ?C)  ?SpO2: (!) 89% 92%  ? ?Filed Weights  ? 08/03/21 2258  ?Weight: 62.6 kg  ? ? ?Intake/Output from previous day: ?03/30 0701 - 03/31 0700 ?In: 57.3 [I.V.:57.3] ?Out: 650 [Urine:650] ? ?Physical Exam ?Vitals reviewed.  ?HENT:  ?   Head: Normocephalic.  ?   Mouth/Throat:  ?   Pharynx: No oropharyngeal exudate or posterior oropharyngeal erythema.  ?Eyes:  ?   General: No scleral icterus. ?   Conjunctiva/sclera: Conjunctivae normal.  ?Cardiovascular:  ?   Rate and Rhythm: Normal rate and regular rhythm.  ?Pulmonary:  ?   Comments: Diminished to the left lung. ?Abdominal:  ?   Palpations: Abdomen is soft.  ?   Tenderness: There is no abdominal tenderness.  ?Skin: ?   General: Skin is warm and dry.  ?Neurological:  ?   Mental Status: She is alert and oriented to person, place, and time.  ? ? ?LABORATORY DATA:  ?I have reviewed the data as listed ? ?  Latest Ref Rng & Units 08/04/2021  ?  9:53 AM 08/03/2021  ? 11:19 PM 07/05/2021  ?  8:44 AM  ?CMP  ?Glucose 70 - 99 mg/dL 128   103   98    ?BUN 8 - 23 mg/dL '23   24   21    ' ?Creatinine 0.44 - 1.00 mg/dL 0.94   0.85   0.94    ?Sodium 135 - 145 mmol/L 136   137   139    ?Potassium 3.5 - 5.1 mmol/L 3.4   3.5   3.6    ?Chloride 98 - 111 mmol/L 103   105   106    ?CO2 22 - 32 mmol/L '22   20   28    ' ?Calcium 8.9 - 10.3 mg/dL 8.6   9.1   9.4    ?Total Protein 6.5 - 8.1 g/dL 6.9   6.6   6.7    ?Total Bilirubin 0.3 - 1.2 mg/dL 0.8   0.7   0.6    ?Alkaline Phos 38 - 126 U/L 345   303   338    ?AST 15 - 41 U/L '17   14   17    ' ?ALT 0 - 44  U/L '11   9   14    ' ? ? ?Lab Results  ?Component Value Date  ? WBC 6.5 08/04/2021  ? HGB 11.7 (L) 08/04/2021  ? HCT 34.9 (L) 08/04/2021  ? MCV 92.3 08/04/2021  ? PLT 89 (L) 08/04/2021  ? NEUTROABS 4.1 08/03/2021  ? ? ?No results found for: CEA1, CEA, [id number], CA125, PSA1 ? ?DG Chest 2 View ? ?Result Date: 08/03/2021 ?  CLINICAL DATA:  Shortness of breath.  Metastatic lung cancer. EXAM: CHEST - 2 VIEW COMPARISON:  CT dated 05/19/2021. FINDINGS: Moderate left pleural effusion and left lung base atelectasis or infiltrate. There is diffuse interstitial and vascular prominence which may represent edema, or pneumonia. Metastatic disease is not excluded. No pneumothorax. The cardiac borders are silhouetted. Degenerative changes of the spine. Bilateral humeral sclerotic lesions consistent with metastatic disease. No acute osseous pathology. IMPRESSION: 1. Moderate left pleural effusion and left lung base atelectasis or infiltrate. 2. Diffuse interstitial and vascular prominence may represent edema, or pneumonia. Electronically Signed   By: Anner Crete M.D.   On: 08/03/2021 23:40  ? ?DG Shoulder Right ? ?Result Date: 08/01/2021 ?CLINICAL DATA:  Right shoulder pain, no known injury. EXAM: RIGHT SHOULDER - 2+ VIEW COMPARISON:  None. FINDINGS: There is no evidence of fracture or dislocation. Osteopenia with mixed lytic/sclerotic appearance of the osseous structures. Mild glenohumeral and moderate acromioclavicular osteoarthritis. High-riding humeral head concerning for rotator cuff arthropathy. IMPRESSION: 1. Diffuse osteopenia. Mixed lytic/sclerotic appearance of the osseous structures, which may be secondary to metastatic disease. No acute fracture. 2. Mild glenohumeral and moderate acromioclavicular osteoarthritis. 3. High-riding humeral head concerning for rotator cuff arthropathy. Electronically Signed   By: Keane Police D.O.   On: 08/01/2021 10:45  ? ?CT Angio Chest PE W and/or Wo Contrast ? ?Result Date:  08/04/2021 ?CLINICAL DATA:  Chest pain or SOB, pleurisy or effusion suspected. Dyspnea on exertion. Metastatic lung cancer. EXAM: CT ANGIOGRAPHY CHEST WITH CONTRAST TECHNIQUE: Multidetector CT imaging of the chest was performed usin

## 2021-08-04 NOTE — Progress Notes (Signed)
Opened in error

## 2021-08-04 NOTE — TOC Progression Note (Signed)
Transition of Care (TOC) - Progression Note  ? ? ?Patient Details  ?Name: Stefanie Camacho ?MRN: [id number] ?Date of Birth: 1945/06/04 ? ?Transition of Care (TOC) CM/SW Contact  ?Purcell Mouton, RN ?Phone Number: ?08/04/2021, 12:34 PM ? ?Clinical Narrative:    ?Confirmed with pt's sister at bedside that pt is from [facility] and will return at discharge. ? ? ?Expected Discharge Plan: Assisted Living ?Barriers to Discharge: No Barriers Identified ? ?Expected Discharge Plan and Services ?Expected Discharge Plan: Assisted Living ?  ?Discharge Planning Services: CM Consult ?  ?Living arrangements for the past 2 months: [place] ?                ?  ?  ?  ?  ?  ?  ?  ?  ?  ?  ? ? ?Social Determinants of Health (SDOH) Interventions ?  ? ?Readmission Risk Interventions ?   ? View : No data to display.  ?  ?  ?  ? ? ?

## 2021-08-04 NOTE — Progress Notes (Addendum)
ANTICOAGULATION CONSULT NOTE - Initial Consult ? ?Pharmacy Consult for heparin ?Indication: pulmonary embolus ? ?Allergies  ?Allergen Reactions  ? Sertraline Hcl Other (See Comments)  ?  Severe diarrhea  ? ? ?Patient Measurements: ?Height: 5\' 5"  (165.1 cm) ?Weight: 62.6 kg (138 lb) ?IBW/kg (Calculated) : 57 ?Heparin Dosing Weight: 62.6 KG ? ?Vital Signs: ?Temp: 98.4 ?F (36.9 ?C) (03/30 2257) ?Temp Source: Oral (03/30 2257) ?BP: 117/74 (03/31 0010) ?Pulse Rate: 90 (03/31 0010) ? ?Labs: ?Recent Labs  ?  08/03/21 ?2319  ?HGB 10.7*  ?HCT 32.2*  ?PLT 89*  ?CREATININE 0.85  ?TROPONINIHS 20*  ? ? ?Estimated Creatinine Clearance: 52.3 mL/min (by C-G formula based on SCr of 0.85 mg/dL). ? ? ?Medical History: ?Past Medical History:  ?Diagnosis Date  ? Acute CVA (cerebrovascular accident) ([place]) 01/09/2020  ? Adenocarcinoma of left lung, stage 4 ([place]) 01/18/2020  ? Anxiety   ? Blood clots in brain   ? Chronic kidney disease due to hypertension 05/16/2020  ? Chronic kidney disease, stage 3a ([place]) 05/16/2020  ? Chronic respiratory failure ([place]) 05/16/2020  ? Cough 03/18/2020  ? Daytime somnolence 05/16/2020  ? Dizziness 09/25/2019  ? Dyslipidemia   ? Encounter for antineoplastic chemotherapy 01/18/2020  ? Essential hypertension   ? Gastro-esophageal reflux disease without esophagitis   ? Generalized weakness 03/18/2020  ? GERD (gastroesophageal reflux disease)   ? Goals of care, counseling/discussion 01/18/2020  ? Hardening of the aorta (main artery of the heart) ([place]) 09/08/2020  ? Hyperlipidemia 05/16/2020  ? Hypothyroidism   ? Hypothyroidism, unspecified   ? Insomnia 05/16/2020  ? Intracranial bleeding ([place]) 01/09/2020  ? Left bundle branch block 06/04/2018  ? 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 Long term (current) use of anticoagulants 05/16/2020  ? Lumbar foraminal stenosis 12/30/2019  ? Lung mass 05/16/2020  ? 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[place]) 05/16/2020  ? Major depression, single episode 05/16/2020  ? Malignant neoplasm of  lower lobe, left bronchus or lung ([place]) 01/14/2020  ? NICM (nonischemic cardiomyopathy) ([place])   ? Nonischemic cardiomyopathy ([place]) 06/04/2018  ? Ejection fraction 45% in summer 2019  ? Nonobstructive cardiomyopathy ([place])   ? Obstructive sleep apnea syndrome   ? Recurrent falls 07/12/2020  ? Rosacea 05/16/2020  ? 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[place]) 01/28/2020  ? 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[place]) 01/29/2020  ? 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 Single subsegmental pulmonary embolism without acute cor pulmonale ([place]) 03/25/2020  ? Sleep apnea   ? Stress incontinence (female) (female) 05/16/2020  ? TIA (transient ischemic attack) 01/08/2020  ? Vertigo 09/25/2019  ? ? ?Medications:  ?(Not in a hospital admission)  ? ?Assessment: 76 yo female with metastatic lung cancer found to have new PE. Patient reported Eliquis prior to admit, with last dose 08/03/21 @ 0730. Platelets chronically low. Given new PE while on anticoagulant, and last dose greater than 12 hours, will give reduced bolus. STAT aptt/heparin level pending ? ?Goal of Therapy:  ?Heparin level 0.3-0.7 units/ml ?Monitor platelets by anticoagulation protocol: Yes ?  ?Plan:  ?Give 2000 units bolus x 1 ?Start heparin infusion at 1050 units/hr ?Check anti-Xa level in 6 hours and daily while on heparin ?Continue to monitor H&H and platelets ? ?Jodean Lima Beuford Garcilazo ?08/04/2021,1:22 AM ? ? ?

## 2021-08-04 NOTE — ED Notes (Signed)
Called [facility] to transport patient to [facility][phone] ?

## 2021-08-05 ENCOUNTER — Observation Stay (HOSPITAL_COMMUNITY): Payer: Medicare Other

## 2021-08-05 DIAGNOSIS — I428 Other cardiomyopathies: Secondary | ICD-10-CM

## 2021-08-05 DIAGNOSIS — J9621 Acute and chronic respiratory failure with hypoxia: Secondary | ICD-10-CM | POA: Diagnosis present

## 2021-08-05 DIAGNOSIS — E039 Hypothyroidism, unspecified: Secondary | ICD-10-CM

## 2021-08-05 DIAGNOSIS — Z515 Encounter for palliative care: Secondary | ICD-10-CM | POA: Diagnosis not present

## 2021-08-05 DIAGNOSIS — C7931 Secondary malignant neoplasm of brain: Secondary | ICD-10-CM | POA: Diagnosis present

## 2021-08-05 DIAGNOSIS — Z8673 Personal history of transient ischemic attack (TIA), and cerebral infarction without residual deficits: Secondary | ICD-10-CM | POA: Diagnosis not present

## 2021-08-05 DIAGNOSIS — J90 Pleural effusion, not elsewhere classified: Secondary | ICD-10-CM

## 2021-08-05 DIAGNOSIS — C3492 Malignant neoplasm of unspecified part of left bronchus or lung: Secondary | ICD-10-CM | POA: Diagnosis not present

## 2021-08-05 DIAGNOSIS — C3432 Malignant neoplasm of lower lobe, left bronchus or lung: Secondary | ICD-10-CM | POA: Diagnosis present

## 2021-08-05 DIAGNOSIS — J9601 Acute respiratory failure with hypoxia: Secondary | ICD-10-CM

## 2021-08-05 DIAGNOSIS — I2699 Other pulmonary embolism without acute cor pulmonale: Secondary | ICD-10-CM | POA: Diagnosis present

## 2021-08-05 DIAGNOSIS — Z85118 Personal history of other malignant neoplasm of bronchus and lung: Secondary | ICD-10-CM | POA: Diagnosis not present

## 2021-08-05 DIAGNOSIS — R531 Weakness: Secondary | ICD-10-CM | POA: Diagnosis not present

## 2021-08-05 DIAGNOSIS — Z66 Do not resuscitate: Secondary | ICD-10-CM | POA: Diagnosis present

## 2021-08-05 DIAGNOSIS — Z86711 Personal history of pulmonary embolism: Secondary | ICD-10-CM | POA: Diagnosis not present

## 2021-08-05 DIAGNOSIS — G4733 Obstructive sleep apnea (adult) (pediatric): Secondary | ICD-10-CM | POA: Diagnosis present

## 2021-08-05 DIAGNOSIS — C771 Secondary and unspecified malignant neoplasm of intrathoracic lymph nodes: Secondary | ICD-10-CM | POA: Diagnosis present

## 2021-08-05 DIAGNOSIS — I2693 Single subsegmental pulmonary embolism without acute cor pulmonale: Secondary | ICD-10-CM | POA: Diagnosis present

## 2021-08-05 DIAGNOSIS — F419 Anxiety disorder, unspecified: Secondary | ICD-10-CM | POA: Diagnosis present

## 2021-08-05 DIAGNOSIS — I10 Essential (primary) hypertension: Secondary | ICD-10-CM | POA: Diagnosis not present

## 2021-08-05 DIAGNOSIS — N1831 Chronic kidney disease, stage 3a: Secondary | ICD-10-CM | POA: Diagnosis present

## 2021-08-05 DIAGNOSIS — I130 Hypertensive heart and chronic kidney disease with heart failure and stage 1 through stage 4 chronic kidney disease, or unspecified chronic kidney disease: Secondary | ICD-10-CM | POA: Diagnosis present

## 2021-08-05 DIAGNOSIS — I5041 Acute combined systolic (congestive) and diastolic (congestive) heart failure: Secondary | ICD-10-CM | POA: Diagnosis not present

## 2021-08-05 DIAGNOSIS — K5900 Constipation, unspecified: Secondary | ICD-10-CM | POA: Diagnosis not present

## 2021-08-05 DIAGNOSIS — I5043 Acute on chronic combined systolic (congestive) and diastolic (congestive) heart failure: Secondary | ICD-10-CM

## 2021-08-05 DIAGNOSIS — E785 Hyperlipidemia, unspecified: Secondary | ICD-10-CM

## 2021-08-05 DIAGNOSIS — Z6820 Body mass index (BMI) 20.0-20.9, adult: Secondary | ICD-10-CM | POA: Diagnosis not present

## 2021-08-05 DIAGNOSIS — I5021 Acute systolic (congestive) heart failure: Secondary | ICD-10-CM | POA: Diagnosis not present

## 2021-08-05 DIAGNOSIS — C7801 Secondary malignant neoplasm of right lung: Secondary | ICD-10-CM | POA: Diagnosis present

## 2021-08-05 DIAGNOSIS — D696 Thrombocytopenia, unspecified: Secondary | ICD-10-CM

## 2021-08-05 DIAGNOSIS — I951 Orthostatic hypotension: Secondary | ICD-10-CM | POA: Diagnosis present

## 2021-08-05 DIAGNOSIS — C7951 Secondary malignant neoplasm of bone: Secondary | ICD-10-CM | POA: Diagnosis present

## 2021-08-05 DIAGNOSIS — R0602 Shortness of breath: Secondary | ICD-10-CM | POA: Diagnosis not present

## 2021-08-05 DIAGNOSIS — Z20822 Contact with and (suspected) exposure to covid-19: Secondary | ICD-10-CM | POA: Diagnosis present

## 2021-08-05 DIAGNOSIS — Z7189 Other specified counseling: Secondary | ICD-10-CM | POA: Diagnosis not present

## 2021-08-05 DIAGNOSIS — E440 Moderate protein-calorie malnutrition: Secondary | ICD-10-CM | POA: Diagnosis present

## 2021-08-05 DIAGNOSIS — F32A Depression, unspecified: Secondary | ICD-10-CM | POA: Diagnosis present

## 2021-08-05 LAB — LACTATE DEHYDROGENASE: LDH: 313 U/L — ABNORMAL HIGH (ref 98–192)

## 2021-08-05 LAB — PROTEIN, PLEURAL OR PERITONEAL FLUID: Total protein, fluid: 3.5 g/dL

## 2021-08-05 LAB — MAGNESIUM: Magnesium: 2.4 mg/dL (ref 1.7–2.4)

## 2021-08-05 LAB — COMPREHENSIVE METABOLIC PANEL
ALT: 10 U/L (ref 0–44)
AST: 16 U/L (ref 15–41)
Albumin: 3.2 g/dL — ABNORMAL LOW (ref 3.5–5.0)
Alkaline Phosphatase: 284 U/L — ABNORMAL HIGH (ref 38–126)
Anion gap: 8 (ref 5–15)
BUN: 23 mg/dL (ref 8–23)
CO2: 25 mmol/L (ref 22–32)
Calcium: 8.7 mg/dL — ABNORMAL LOW (ref 8.9–10.3)
Chloride: 102 mmol/L (ref 98–111)
Creatinine, Ser: 0.94 mg/dL (ref 0.44–1.00)
GFR, Estimated: >60 mL/min (ref >60)
Glucose, Bld: 100 mg/dL — ABNORMAL HIGH (ref 70–99)
Potassium: 3.8 mmol/L (ref 3.5–5.1)
Sodium: 135 mmol/L (ref 135–145)
Total Bilirubin: 0.6 mg/dL (ref 0.3–1.2)
Total Protein: 6.1 g/dL — ABNORMAL LOW (ref 6.5–8.1)

## 2021-08-05 LAB — CBC
HCT: 31.9 % — ABNORMAL LOW (ref 36.0–46.0)
Hemoglobin: 10.3 g/dL — ABNORMAL LOW (ref 12.0–15.0)
MCH: 30.7 pg (ref 26.0–34.0)
MCHC: 32.3 g/dL (ref 30.0–36.0)
MCV: 95.2 fL (ref 80.0–100.0)
Platelets: 72 10*3/uL — ABNORMAL LOW (ref 150–400)
RBC: 3.35 MIL/uL — ABNORMAL LOW (ref 3.87–5.11)
RDW: 15.6 % — ABNORMAL HIGH (ref 11.5–15.5)
WBC: 5.4 10*3/uL (ref 4.0–10.5)
nRBC: 0 % (ref 0.0–0.2)

## 2021-08-05 LAB — APTT: aPTT: 91 seconds — ABNORMAL HIGH (ref 24–36)

## 2021-08-05 LAB — LACTATE DEHYDROGENASE, PLEURAL OR PERITONEAL FLUID: LD, Fluid: 199 U/L — ABNORMAL HIGH (ref 3–23)

## 2021-08-05 LAB — HEPARIN LEVEL (UNFRACTIONATED): Heparin Unfractionated: >1.1 IU/mL — ABNORMAL HIGH (ref 0.30–0.70)

## 2021-08-05 MED ORDER — ENOXAPARIN SODIUM 100 MG/ML IJ SOSY
1.5000 mg/kg | PREFILLED_SYRINGE | Freq: Every day | INTRAMUSCULAR | Status: DC
  Administered 2021-08-05 – 2021-08-08 (×4): 90 mg via SUBCUTANEOUS
  Filled 2021-08-05 (×4): qty 1

## 2021-08-05 MED ORDER — ORAL CARE MOUTH RINSE
15.0000 mL | Freq: Two times a day (BID) | OROMUCOSAL | Status: DC
  Administered 2021-08-05 – 2021-08-08 (×7): 15 mL via OROMUCOSAL

## 2021-08-05 MED ORDER — LIDOCAINE HCL 1 % IJ SOLN
INTRAMUSCULAR | Status: AC
  Administered 2021-08-05: 10 mL
  Filled 2021-08-05: qty 20

## 2021-08-05 MED ORDER — METOPROLOL SUCCINATE ER 50 MG PO TB24
50.0000 mg | ORAL_TABLET | Freq: Every day | ORAL | Status: DC
  Administered 2021-08-05: 50 mg via ORAL
  Filled 2021-08-05: qty 1

## 2021-08-05 MED ORDER — FUROSEMIDE 10 MG/ML IJ SOLN: 40.0000 mg | Freq: Every day | INTRAMUSCULAR | Status: DC

## 2021-08-05 MED ORDER — LOSARTAN POTASSIUM 25 MG PO TABS
25.0000 mg | ORAL_TABLET | Freq: Every day | ORAL | Status: DC
  Administered 2021-08-05: 25 mg via ORAL
  Filled 2021-08-05: qty 1

## 2021-08-05 MED ORDER — ENOXAPARIN SODIUM 60 MG/0.6ML IJ SOSY: 60.0000 mg | PREFILLED_SYRINGE | Freq: Two times a day (BID) | INTRAMUSCULAR | Status: DC

## 2021-08-05 MED ORDER — FUROSEMIDE 20 MG PO TABS
20.0000 mg | ORAL_TABLET | Freq: Every day | ORAL | Status: DC
  Filled 2021-08-05: qty 1

## 2021-08-05 MED ORDER — PHENOL 1.4 % MT LIQD: 1.0000 | OROMUCOSAL | Status: DC | PRN

## 2021-08-05 MED ORDER — METOPROLOL SUCCINATE ER 25 MG PO TB24
25.0000 mg | ORAL_TABLET | Freq: Every day | ORAL | Status: DC
  Administered 2021-08-06 – 2021-08-08 (×3): 25 mg via ORAL
  Filled 2021-08-05 (×3): qty 1

## 2021-08-05 MED ORDER — FUROSEMIDE 20 MG PO TABS
20.0000 mg | ORAL_TABLET | Freq: Every day | ORAL | Status: DC
Start: 2021-08-06 — End: 2021-08-06

## 2021-08-05 NOTE — Assessment & Plan Note (Addendum)
CTA chest showed bilateral pleural effusion, left> right.  Had thoracocentesis with removal of 1.2 L exudative fluid likely from extensive lung cancer +/- CHF.  Improved aeration and oxygenation. ?-Continue encouraging incentive spirometry and staying out of bed ?

## 2021-08-05 NOTE — Evaluation (Signed)
Occupational Therapy Evaluation ?Patient Details ?Name: Stefanie Camacho ?MRN: [id number] ?DOB: [birth year] ?Today's Date: 08/05/2021 ? ? ?History of Present Illness 76 yo female presents to [facility] on 3/30 with ShOB x2 days. CTA chest shows acute subsegmental pulmonary embolus within RLL without heart strain as well as extensive interstitial and airspace infiltrate suggestive of moderate pulmonary edema and bilat pleural effusions L>R; associated subtotal collapse of LLL; progressive pulmonary metastatic disease. Thoracentesis on 4/1 yielding 1.2 L of clear yellow fluid. 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  ? ?Clinical Impression ?  ?Patient is a 76 year old female who was admitted with above. Patient was living at ALF at rollator level prior to admission. Patient was noted to have increased fatigue and orthostatic hypotension on this date limiting session. See BP below. Patient was noted to have decreased functional activity tolerance, decreased endurance, decreased standing balance, decreased safety awareness and increased pain impacting participation in ADLs. Patient reported pain in R posterior upper arm axillary area with pending appointment to check lymph nodes per son report. Patient still abel to ROM BUE WFL at this time. At patients current level, Patient would need 24/7 caregiver physical support for ADL tasks in next level of care. Patient would continue to benefit from skilled OT services at this time while admitted and after d/c to address noted deficits in order to improve overall safety and independence in ADLs.  ? ? ?Blood pressures: ?Supine: 94/67mmhg 81 bpm ?Sitting: 90/57 mmhg 90 bpm ?Standing: 59/21 mmhg 105 bpm (returned to supine in trendelenburg) ?Supine: 94/35mmhg 79 bpm ?   ? ?Recommendations for follow up therapy are one component of a  multi-disciplinary discharge planning process, led by the attending physician.  Recommendations may be updated based on patient status, additional functional criteria and insurance authorization.  ? ?Follow Up Recommendations ? Skilled nursing-short term rehab (<3 hours/day) (v.s. ALF with caregiver support)  ?  ?Assistance Recommended at Discharge Frequent or constant Supervision/Assistance  ?Patient can return home with the following A lot of help with bathing/dressing/bathroom;Assistance with cooking/housework;Direct supervision/assist for medications management;Assist for transportation;Help with stairs or ramp for entrance;Direct supervision/assist for financial management;A lot of help with walking and/or transfers ? ?  ?Functional Status Assessment ? Patient has had a recent decline in their functional status and demonstrates the ability to make significant improvements in function in a reasonable and predictable amount of time.  ?Equipment Recommendations ? Other (comment) (TBD)  ?  ?Recommendations for Other Services   ? ? ?  ?Precautions / Restrictions Precautions ?Precautions: Fall ?Precaution Comments: RUE tenderness over the past couple of weekends, especially in axillary region. 5LO2 via HFNC ?Restrictions ?Weight Bearing Restrictions: No  ? ?  ? ?Mobility Bed Mobility ?Overal bed mobility: Needs Assistance ?Bed Mobility: Supine to Sit, Sit to Supine ?  ?  ?Supine to sit: Min assist, +2 for physical assistance ?Sit to supine: Mod assist, +2 for physical assistance ?  ?General bed mobility comments: min-mod +2 for trunk and LE management, boost up in bed upon return to supine. Very increased time to scoot to EOB. ?  ? ?Transfers ?  ?  ?  ?  ?  ?  ?  ?  ?  ?  ?  ? ?  ?Balance Overall balance assessment: Needs assistance ?Sitting-balance support: No upper extremity supported, Feet supported ?Sitting balance-Leahy Scale: Fair ?  ?  ?Standing balance support:  Bilateral upper extremity supported, During  functional activity, Reliant on assistive device for balance ?Standing balance-Leahy Scale: Poor ?Standing balance comment: reliant on external support ?  ?  ?  ?  ?  ?  ?  ?  ?  ?  ?  ?   ? ?ADL either performed or assessed with clinical judgement  ? ?ADL Overall ADL's : Needs assistance/impaired ?Eating/Feeding: Set up;Sitting ?Eating/Feeding Details (indicate cue type and reason): to take sips of water in bed. ?Grooming: Wash/dry hands;Sitting;Set up ?  ?Upper Body Bathing: Minimal assistance;Bed level ?  ?Lower Body Bathing: Bed level;Maximal assistance ?  ?Upper Body Dressing : Bed level;Minimal assistance ?  ?Lower Body Dressing: Bed level;Maximal assistance ?  ?  ?Toilet Transfer Details (indicate cue type and reason): patient was min A x2 for standing with midnflu hand placement to avoid pulling on BUE with RW. patient attempted standing with BP noted to be 59/31mmhg. patient was immediately transfered back to supine in bed with trendellenburge positioning. after about 1 min patients BP was 94/54 mmhg. no LOC. patient reported feeling "disoriented" when standing. ?Toileting- Clothing Manipulation and Hygiene: Maximal assistance;Sit to/from stand ?  ?  ?  ?  ?General ADL Comments: unable to tolerate standing or transfers on this date.  ? ? ? ?Vision   ?Additional Comments: patient reported no change in vision since admission to hospital  ?   ?Perception   ?  ?Praxis   ?  ? ?Pertinent Vitals/Pain Pain Assessment ?Pain Assessment: Faces ?Pain Score: 2  ?Faces Pain Scale: Hurts little more ?Pain Location: L shoulder from thoracentesis ?Pain Descriptors / Indicators: Discomfort ?Pain Intervention(s): Limited activity within patient's tolerance, Monitored during session, Repositioned  ? ? ? ?Hand Dominance Right ?  ?Extremity/Trunk Assessment Upper Extremity Assessment ?Upper Extremity Assessment: Generalized weakness (patient has pain in bilateral shoulder with MMT not tested.) ?  ?Lower Extremity  Assessment ?Lower Extremity Assessment: Defer to PT evaluation ?  ?Cervical / Trunk Assessment ?Cervical / Trunk Assessment: Kyphotic ?  ?Communication Communication ?Communication: No difficulties ?  ?Cognition Arousal/Alertness: Awake/alert ?Behavior During Therapy: [facility] for tasks assessed/performed ?  ?  ?  ?  ?  ?  ?  ?  ?  ?  ?  ?  ?  ?  ?  ?  ?  ?General Comments: patient was oriented to day month and date with reporting 2024 but was able to correct when hearing cues from son. ?  ?  ?General Comments  + orthostatic hypotension, RN notified ? ?  ?Exercises   ?  ?Shoulder Instructions    ? ? ?Home Living Family/patient expects to be discharged to:: Assisted living (ALF since april 2022) ?  ?  ?  ?  ?  ?  ?Home Layout: One level ?  ?  ?  ?  ?  ?  ?  ?Home Equipment: Rollator (4 wheels);BSC/3in1 ?  ?Additional Comments: patient had caregiver support for showers. ?  ? ?  ?Prior Functioning/Environment Prior Level of Function : Needs assist ?  ?  ?  ?  ?  ?  ?Mobility Comments: pt walks with rollator, also has access to w/c as needed ?ADLs Comments: pt states she has help as she needs at ALF, over the last couple of weeks reports needing more assist ?  ? ?  ?  ?OT Problem List: Impaired balance (sitting and/or standing);Decreased knowledge of precautions;Decreased strength;Decreased activity tolerance;Decreased coordination;Decreased knowledge of use of DME or AE;Pain;Decreased safety awareness;Cardiopulmonary status limiting  activity ?  ?   ?OT Treatment/Interventions: Self-care/ADL training;DME and/or AE instruction;Therapeutic activities;Balance training;Therapeutic exercise;Energy conservation;Neuromuscular education;Patient/family education  ?  ?OT Goals(Current goals can be found in the care plan section) Acute Rehab OT Goals ?Patient Stated Goal: to go back to ALF ?OT Goal Formulation: With patient/family ?Time For Goal Achievement: 08/19/21 ?Potential to Achieve Goals: Good  ?OT Frequency: Min 2X/week ?   ? ?Co-evaluation PT/OT/SLP Co-Evaluation/Treatment: Yes ?Reason for Co-Treatment: For patient/therapist safety;Complexity of the patient's impairments (multi-system involvement) ?PT goals addressed during session: Mobility/safe

## 2021-08-05 NOTE — Assessment & Plan Note (Addendum)
Multifactorial including underlying CHF, pleural effusion, lung cancer, possible drug-induced pneumonitis from Tagrisso and possibly PE although small.  Desaturated to 81% on RA and required up to 5 L at one-point.  Maintain saturation greater than 95% with ambulation on room air on the day of discharge. ?-Recommend continuing incentive spirometry and staying out of bed ?-Continue subcu Lovenox for pulmonary embolism and p.o. Lasix for CHF ?

## 2021-08-05 NOTE — Assessment & Plan Note (Signed)
Continue Prozac

## 2021-08-05 NOTE — Hospital Course (Addendum)
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 Earlie Server, pulmonary embolism on Eliquis, combined CHF/NICM, CVA, CKD-3A, OSA not on CPAP, hypothyroidism, anxiety, depression and debility presenting from ILF with shortness of breath and fatigue and found to have acute subsegmental PE in RLL.  CTA chest also concerning for progressive pulmonary and and widespread osseous mets and moderate pulmonary edema with associated bilateral pleural effusion, left> right with associated sepsis total collapse of the LLL.  Patient was started on IV heparin and IV Lasix.  TTE ordered.  Oncology consulted.  Concern about Eliquis failure.  Oncology recommended Lovenox on discharge. ? ?TTE with LVEF of 30 to 35%, G2 DD and GH (worse than prior).  Cardiology consulted and gave recommendations and signed off.  Patient has significant orthostatic hypotension.  Metoprolol reduced.  Other cardiac meds discontinued.  Eventually, orthostatic hypotension improved.  Losartan and Aldactone discontinued on discharge.  Toprol-XL reduced to 25 mg daily.  She tolerated p.o. Lasix 20 mg for 2 days.  Discharged on p.o. Lasix 20 mg every other day.  Recommend outpatient follow-up with a cardiologist in 2 to 3 weeks. ? ?In regards to respiratory failure, she required up to 5 L to maintain appropriate saturation.  She underwent thoracocentesis with removal of 1.2 exudative fluid from left chest 1/1.  She also received IV Lasix initially.  Eventually, she was liberated off oxygen.  She was ambulated on room air and maintained saturation greater than 95%. ? ?In regards to lung cancer, reached out to oncology on the day of discharge.  Oncology recommended holding Tagrisso until follow-up for further evaluation and discussion of treatment option. ? ?In regards to pulmonary embolism while on Eliquis, she is discharged on Lovenox  injection per recommendation by oncology. ? ?Therapy recommended SNF but patient family to go back previous ALF after talking staff at [facility]. ?

## 2021-08-05 NOTE — Assessment & Plan Note (Addendum)
Recent Labs  ?Lab 08/03/21 ?2319 08/04/21 ?0353 08/05/21 ?0615 08/06/21 ?9675 08/07/21 ?9163 08/08/21 ?8466  ?PLT 89* 89* 72* 73* 78* 78*  ?Slowly improving.  No signs of bleeding.  Recheck CBC in 1 week or sooner if needed ? ?

## 2021-08-05 NOTE — Evaluation (Signed)
Physical Therapy Evaluation ?Patient Details ?Name: Stefanie Camacho ?MRN: [id number] ?DOB: 06-23-1945 ?Today's Date: 08/05/2021 ? ?History of Present Illness ? 76 yo female presents to [facility] on 3/30 with ShOB x2 days. CTA chest shows acute subsegmental pulmonary embolus within RLL without heart strain as well as extensive interstitial and airspace infiltrate suggestive of moderate pulmonary edema and bilat pleural effusions L>R; associated subtotal collapse of LLL; progressive pulmonary metastatic disease. Thoracentesis on 4/1 yielding 1.2 L of clear yellow fluid. 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  ?Clinical Impression ?  ?Pt presents with generalized weakness, malaise, impaired balance, and decreased activity tolerance. Pt to benefit from acute PT to address deficits. Pt requiring min-mod +2 assist for mobility at this time, severely limited by + orthostasis (see below), RN notified immediately but had good recovery of BP in supine and trendelenburg. PT to progress mobility as tolerated, and will continue to follow acutely.  ? ? ? 08/05/21 1500  ?Orthostatic Lying   ?BP- Lying 94/54  ?Pulse- Lying 88  ?Orthostatic Sitting  ?BP- Sitting 90/57  ?Pulse- Sitting 88  ?Orthostatic Standing at 0 minutes  ?BP- Standing at 0 minutes (!) 59/21  ?Pulse- Standing at 0 minutes 105  ?  ?Pt immediately returned to supine, with recovery of BP to 94/54, HR 79 bpm  ?   ? ?Recommendations for follow up therapy are one component of a multi-disciplinary discharge planning process, led by the attending physician.  Recommendations may be updated based on patient status, additional functional criteria and insurance authorization. ? ?Follow Up Recommendations Other (comment) (return to ALF with increased level of care, vs ST-SNF to return to PLOF prior to return to ALF) ? ?   ?Assistance Recommended at Discharge Frequent or constant Supervision/Assistance  ?Patient can return home with the following ? A little help with bathing/dressing/bathroom;A little help with walking and/or transfers;Direct supervision/assist for medications management;Assistance with cooking/housework;Assist for transportation ? ?  ?Equipment Recommendations None recommended by PT  ?Recommendations for Other Services ?    ?  ?Functional Status Assessment Patient has had a recent decline in their functional status and/or demonstrates limited ability to make significant improvements in function in a reasonable and predictable amount of time  ? ?  ?Precautions / Restrictions Precautions ?Precautions: Fall ?Precaution Comments: RUE tenderness over the past couple of weekends, especially in axillary region. 5LO2 via HFNC ?Restrictions ?Weight Bearing Restrictions: No  ? ?  ? ?Mobility ? Bed Mobility ?Overal bed mobility: Needs Assistance ?Bed Mobility: Supine to Sit, Sit to Supine ?  ?  ?Supine to sit: Min assist, +2 for physical assistance ?Sit to supine: Mod assist, +2 for physical assistance ?  ?General bed mobility comments: min-mod +2 for trunk and LE management, boost up in bed upon return to supine. Very increased time to scoot to EOB. ?  ? ?Transfers ?Overall transfer level: Needs assistance ?Equipment used: Rolling walker (2 wheels) ?Transfers: Sit to/from Stand ?Sit to Stand: Min assist, +2 physical assistance, From elevated surface ?  ?  ?  ?  ?  ?General transfer comment: min +2 for power up, rise, steadying. Cues for hand placement when rising, PT facilitating rise from pelvis given RUE tenderness. ?  ? ?Ambulation/Gait ?  ?  ?  ?  ?  ?  ?  ?General Gait Details: NT given orthostasis ? ?Stairs ?  ?  ?  ?  ?  ? ?  Wheelchair Mobility ?  ? ?Modified Rankin (Stroke Patients Only) ?  ? ?  ? ?Balance Overall balance assessment: Needs assistance ?Sitting-balance support: No upper extremity supported, Feet  supported ?Sitting balance-Leahy Scale: Fair ?  ?  ?Standing balance support: Bilateral upper extremity supported, During functional activity, Reliant on assistive device for balance ?Standing balance-Leahy Scale: Poor ?Standing balance comment: reliant on external support ?  ?  ?  ?  ?  ?  ?  ?  ?  ?  ?  ?   ? ? ? ?Pertinent Vitals/Pain Pain Assessment ?Pain Assessment: 0-10 ?Pain Score: 2  ?Pain Location: L shoulder from thoracentesis ?Pain Descriptors / Indicators: Discomfort ?Pain Intervention(s): Limited activity within patient's tolerance, Monitored during session, Repositioned  ? ? ?Home Living Family/patient expects to be discharged to:: Assisted living (ALF since april 2022) ?  ?  ?  ?  ?  ?  ?  ?Home Layout: One level ?Home Equipment: Rollator (4 wheels);BSC/3in1 ?Additional Comments: patient had caregiver support for showers.  ?  ?Prior Function Prior Level of Function : Needs assist ?  ?  ?  ?  ?  ?  ?Mobility Comments: pt walks with rollator, also has access to w/c as needed ?ADLs Comments: pt states she has help as she needs at ALF, over the last couple of weeks reports needing more assist ?  ? ? ?Hand Dominance  ? Dominant Hand: Right ? ?  ?Extremity/Trunk Assessment  ? Upper Extremity Assessment ?Upper Extremity Assessment: Defer to OT evaluation ?  ? ?Lower Extremity Assessment ?Lower Extremity Assessment: Generalized weakness ?  ? ?Cervical / Trunk Assessment ?Cervical / Trunk Assessment: Kyphotic  ?Communication  ? Communication: No difficulties  ?Cognition Arousal/Alertness: Awake/alert ?Behavior During Therapy: [facility] for tasks assessed/performed ?Overall Cognitive Status: Within Functional Limits for tasks assessed ?  ?  ?  ?  ?  ?  ?  ?  ?  ?  ?  ?  ?  ?  ?  ?  ?General Comments: patient was oriented to day month and date with reporting 2024 but was able to correct when hearing cues from son. Increased processing time ?  ?  ? ?  ?General Comments General comments (skin integrity, edema, etc.): +  orthostatic hypotension, RN notified ? ?  ?Exercises    ? ?Assessment/Plan  ?  ?PT Assessment Patient needs continued PT services  ?PT Problem List Decreased strength;Decreased mobility;Decreased safety awareness;Decreased activity tolerance;Decreased balance;Decreased knowledge of use of DME;Pain;Cardiopulmonary status limiting activity;Decreased knowledge of precautions ? ?   ?  ?PT Treatment Interventions DME instruction;Therapeutic activities;Gait training;Therapeutic exercise;Patient/family education;Balance training;Stair training;Functional mobility training;Neuromuscular re-education   ? ?PT Goals (Current goals can be found in the Care Plan section)  ?Acute Rehab PT Goals ?PT Goal Formulation: With patient ?Time For Goal Achievement: 08/19/21 ?Potential to Achieve Goals: Good ? ?  ?Frequency Min 3X/week ?  ? ? ?Co-evaluation   ?  ?  ?  ?  ? ? ?  ?AM-PAC PT "6 Clicks" Mobility  ?Outcome Measure Help needed turning from your back to your side while in a flat bed without using bedrails?: A Little ?Help needed moving from lying on your back to sitting on the side of a flat bed without using bedrails?: A Little ?Help needed moving to and from a bed to a chair (including a wheelchair)?: A Little ?Help needed standing up from a chair using your arms (e.g., wheelchair or bedside chair)?: A Little ?Help needed to walk in  hospital room?: A Lot ?Help needed climbing 3-5 steps with a railing? : A Lot ?6 Click Score: 16 ? ?  ?End of Session Equipment Utilized During Treatment: Oxygen ?Activity Tolerance: Patient limited by fatigue;Treatment limited secondary to medical complications (Comment) ?Patient left: in bed;with call bell/phone within reach;with bed alarm set;with family/visitor present ?Nurse Communication: Mobility status;Other (comment) (orthostatic hypotension) ?PT Visit Diagnosis: Other abnormalities of gait and mobility (R26.89);Muscle weakness (generalized) (M62.81) ?  ? ?Time: 8676-1950 ?PT Time  Calculation (min) (ACUTE ONLY): 22 min ? ? ?Charges:   PT Evaluation ?$PT Eval Low Complexity: 1 Low ?  ?  ?   ? ?Stacie Glaze, PT DPT ?Acute Rehabilitation Services ?Pager [phone]  ?Office [phone] ? ? ?Darcee Dekker E Stroup ?08/05/2021, 3

## 2021-08-05 NOTE — Progress Notes (Signed)
Sent message to [facility] scheduling/triage team to assist with BMET 1 week following DC and f/u with Dr. Agustin Cree 2-4 weeks after discharge. In msg, requested nursing assistance with BMET order as this may be done differently in the [facility] location. The office will call patient with appt information. ?

## 2021-08-05 NOTE — Progress Notes (Addendum)
Patient on 5 L O2 lying down, SpO2 94%.  Assisted to dangle on side of bed, SpO2 82 on room air.  Increased O2 slowly to 4 L until SpO2 reached 90%.  Increased to 5 L, SpO2 96%.  Decreased O2 to 3 L, SpO2 ranging between 89-93%. ? ?Patient complains of feeling "off-balance," and like she "can't take a full breath."  Complaining of 4/10 dull pain at lower left ribs.  Diminished lung sounds auscultated in left LL. ? ?Patient stood at bedside on 3 L, desaturated to 85%.  Assisted to chair, required 6 L O2 for 5 min to reach SpO2 92. ? ?Angie Fava, RN  ?

## 2021-08-05 NOTE — Procedures (Signed)
PROCEDURE SUMMARY: ? ?Successful US guided left thoracentesis. ?Yielded 1.2 L of clear yellow fluid. ?Pt tolerated procedure well. ?No immediate complications. ? ?Specimen sent for labs. ?CXR ordered; no post-procedure pneumothorax identified ? ?EBL < 2 mL ? ?Theresa Duty, NP ?08/05/2021 ?1:56 PM ? ? ? ?

## 2021-08-05 NOTE — Progress Notes (Incomplete)
SATURATION QUALIFICATIONS: (This note is used to comply with regulatory documentation for home oxygen) ? ?Patient Saturations on Room Air at Rest = 81% ? ?Patient saturations on 4 Liters of oxygen at Rest = 90% ? ?Patient Saturations on 5 Liters of oxygen while Ambulating = ***% ? ?Please briefly explain why patient needs home oxygen: ?

## 2021-08-05 NOTE — Consult Note (Addendum)
?Cardiology Consultation:  ? ?Patient ID: Stefanie Camacho ?MRN: [id number]; DOB: 10/26/1945 ? ?Admit date: 08/03/2021 ?Date of Consult: 08/05/2021 ? ?PCP:  [name], MD ?  ?[facility] Providers ?Cardiologist:  Jenne Campus, MD   { ? ? ? ?Patient Profile:  ? ?Stefanie Camacho is a 76 y.o. female with a hx of metastatic lung cancer, prior CVA, nonischemic cardiomyopathy, hyperlipidemia, hypertension, hypothyroidism who is being seen 08/05/2021 for the evaluation of cardiomyopathy at the request of Wendee Beavers MD. ? ?History of Present Illness:  ? ?Echocardiogram January 2022 showed ejection fraction 40 to 45%.  Echocardiogram this admission showed ejection fraction 30 to 35% with grade 2 diastolic dysfunction, moderate left atrial enlargement, moderate left and right pleural effusions, moderate mitral regurgitation, trace aortic insufficiency.  Cardiology asked to evaluate.  Patient admitted August 03, 2021 with complaints of dyspnea.  She was found to have a pulmonary embolus.  Her ejection fraction is now worse and cardiology asked to evaluate.  Patient states that approximately 5 days ago she developed some pain in her right biceps area.  She then developed acute shortness of breath.  No orthopnea, PND or pedal edema.  No chest pain or syncope.  She has been admitted and found to have a pulmonary embolus.  ? ? ?Past Medical History:  ?Diagnosis Date  ? Acute CVA (cerebrovascular accident) ([place]) 01/09/2020  ? Adenocarcinoma of left lung, stage 4 ([place]) 01/18/2020  ? Anxiety   ? Blood clots in brain   ? Chronic kidney disease due to hypertension 05/16/2020  ? Chronic kidney disease, stage 3a ([place]) 05/16/2020  ? Chronic respiratory failure ([place]) 05/16/2020  ? Cough 03/18/2020  ? Daytime somnolence 05/16/2020  ? Dizziness 09/25/2019  ? Dyslipidemia   ? Encounter for antineoplastic chemotherapy 01/18/2020  ? Essential hypertension   ? Gastro-esophageal reflux disease without esophagitis   ? Generalized weakness 03/18/2020  ?  GERD (gastroesophageal reflux disease)   ? Goals of care, counseling/discussion 01/18/2020  ? Hardening of the aorta (main artery of the heart) ([place]) 09/08/2020  ? Hyperlipidemia 05/16/2020  ? Hypothyroidism   ? Hypothyroidism, unspecified   ? Insomnia 05/16/2020  ? Intracranial bleeding ([place]) 01/09/2020  ? Left bundle branch block 06/04/2018  ? 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 Long term (current) use of anticoagulants 05/16/2020  ? Lumbar foraminal stenosis 12/30/2019  ? Lung mass 05/16/2020  ? 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 Major depression, single episode 05/16/2020  ? Malignant neoplasm of lower lobe, left bronchus or lung ([place]) 01/14/2020  ? NICM (nonischemic cardiomyopathy) ([place])   ? Nonischemic cardiomyopathy ([place]) 06/04/2018  ? Ejection fraction 45% in summer 2019  ? Nonobstructive cardiomyopathy ([place])   ? Obstructive sleep apnea syndrome   ? Recurrent falls 07/12/2020  ? Rosacea 05/16/2020  ? 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[place]) 01/28/2020  ? 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 Single subsegmental pulmonary embolism without acute cor pulmonale ([place]) 03/25/2020  ? Sleep apnea   ? Stress incontinence (female) (female) 05/16/2020  ? TIA (transient ischemic attack) 01/08/2020  ? Vertigo 09/25/2019  ? ? ?Past Surgical History:  ?Procedure Laterality Date  ? ABDOMINAL HYSTERECTOMY    ? BLADDER NECK RECONSTRUCTION    ? BRONCHIAL BIOPSY  01/12/2020  ? Procedure: BRONCHIAL BIOPSIES;  Surgeon: Garner Nash, DO;  Location: [facility];  Service: Pulmonary;;  ? BRONCHIAL BRUSHINGS  01/12/2020  ? Procedure: BRONCHIAL  BRUSHINGS;  Surgeon: Garner Nash, DO;  Location: [facility];  Service: Pulmonary;;  ? BRONCHIAL NEEDLE ASPIRATION BIOPSY  01/12/2020  ? Procedure: BRONCHIAL NEEDLE ASPIRATION BIOPSIES;  Surgeon: Garner Nash, DO;  Location: [facility];  Service: Pulmonary;;  ?  BRONCHIAL WASHINGS  01/12/2020  ? Procedure: BRONCHIAL WASHINGS;  Surgeon: Garner Nash, DO;  Location: [facility];  Service: Pulmonary;;  ? SHOULDER SURGERY    ? TONSILLECTOMY    ? VIDEO BRONCHOSCOPY WITH ENDOBRONCHIAL ULTRASOUND  01/12/2020  ? Procedure: VIDEO BRONCHOSCOPY WITH ENDOBRONCHIAL ULTRASOUND;  Surgeon: Garner Nash, DO;  Location: [facility];  Service: Pulmonary;;  ?  ? ?Inpatient Medications: ?Scheduled Meds: ? atorvastatin  10 mg Oral Daily  ? FLUoxetine  20 mg Oral Daily  ? [START ON 08/06/2021] furosemide  40 mg Intravenous Daily  ? levETIRAcetam  250 mg Oral BID  ? levothyroxine  75 mcg Oral Q0600  ? mouth rinse  15 mL Mouth Rinse BID  ? metoprolol succinate  50 mg Oral Daily  ? pantoprazole  40 mg Oral QPM  ? potassium chloride  40 mEq Oral Daily  ? spironolactone  12.5 mg Oral Daily  ? ?Continuous Infusions: ? heparin 750 Units/hr (08/05/21 5170)  ? ?PRN Meds: ?acetaminophen **OR** acetaminophen, oxyCODONE, phenol, polyvinyl alcohol ? ?Allergies:    ?Allergies  ?Allergen Reactions  ? Sertraline Hcl Other (See Comments)  ?  Severe diarrhea  ? ? ?Social History:   ?Social History  ? ?Socioeconomic History  ? Marital status: Divorced  ?  Spouse name: Not on file  ? Number of children: Not on file  ? Years of education: Not on file  ? Highest education level: Not on file  ?Occupational History  ? Not on file  ?Tobacco Use  ? Smoking status: Never  ?  Passive exposure: Never  ? Smokeless tobacco: Never  ?Vaping Use  ? Vaping Use: Never used  ?Substance and Sexual Activity  ? Alcohol use: Not Currently  ? Drug use: Never  ? Sexual activity: Not on file  ?Other Topics Concern  ? Not on file  ?Social History Narrative  ? Not on file  ? ?Social Determinants of Health  ? ?Financial Resource Strain: Not on file  ?Food Insecurity: Not on file  ?Transportation Needs: Not on file  ?Physical Activity: Not on file  ?Stress: Not on file  ?Social Connections: Not on file  ?Intimate Partner Violence: Not on  file  ?  ?Family History:   ? ?Family History  ?Problem Relation Age of Onset  ? Heart disease Mother   ? Stroke Mother   ? Heart disease Father   ? Stroke Father   ? Heart disease Maternal Grandfather   ? Heart disease Paternal Grandfather   ?  ? ?ROS:  ?Please see the history of present illness.  ?Decreased appetite and fatigue from chemotherapy.  No fevers, chills or productive cough. ?All other ROS reviewed and negative.    ? ?Physical Exam/Data:  ? ?Vitals:  ? 08/04/21 1921 08/04/21 2138 08/05/21 0340 08/05/21 0853  ?BP: (!) 96/58 (!) 104/58 (!) 104/55 114/66  ?Pulse: 83 85 75 80  ?Resp: 20  18 20   ?Temp: 98.4 ?F (36.9 ?C)  98.6 ?F (37 ?C) 98.9 ?F (37.2 ?C)  ?TempSrc: Oral  Oral Oral  ?SpO2: 93%  94% 99%  ?Weight:   59.9 kg   ?Height:      ? ? ?Intake/Output Summary (Last 24 hours) at 08/05/2021 1014 ?Last data  filed at 08/05/2021 0955 ?Gross per 24 hour  ?Intake 754.6 ml  ?Output 800 ml  ?Net -45.4 ml  ? ? ?  08/05/2021  ?  3:40 AM 08/03/2021  ? 10:58 PM 07/05/2021  ?  9:04 AM  ?Last 3 Weights  ?Weight (lbs) 132 lb 0.9 oz 138 lb 132 lb 9.6 oz  ?Weight (kg) 59.9 kg 62.596 kg 60.147 kg  ?   ?Body mass index is 21.98 kg/m?.  ?General:  Well nourished, well developed, in no acute distress ?HEENT: normal ?Neck: no JVD ?Vascular: No carotid bruits; Distal pulses 2+ bilaterally ?Cardiac:  normal S1, S2; RRR; no murmur  ?Lungs:  clear to auscultation bilaterally, no wheezing, rhonchi or rales  ?Abd: soft, nontender, no hepatomegaly  ?Ext: no edema ?Musculoskeletal:  No deformities, BUE and BLE strength normal and equal ?Skin: warm and dry  ?Neuro:  CNs 2-12 intact, no focal abnormalities noted ?Psych:  Normal affect  ? ?EKG:  The EKG was personally reviewed and demonstrates: Normal sinus rhythm with left bundle branch block. ?Telemetry:  Telemetry was personally reviewed and demonstrates: Sinus ? ? ?Laboratory Data: ? ?High Sensitivity Troponin:   ?Recent Labs  ?Lab 08/03/21 ?2319 08/04/21 ?0130  ?TROPONINIHS 20* 22*  ?    ?Chemistry ?Recent Labs  ?Lab 08/03/21 ?2319 08/04/21 ?6226 08/05/21 ?0615  ?NA 137 136 135  ?K 3.5 3.4* 3.8  ?CL 105 103 102  ?CO2 20* 22 25  ?GLUCOSE 103* 128* 100*  ?BUN 24* 23 23  ?CREATININE 0.85 0.94 0.94  ?CA

## 2021-08-05 NOTE — Assessment & Plan Note (Addendum)
S/p whole brain radiation andradiation.  Currently on Tagrisso.  Followed by oncology.  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

## 2021-08-05 NOTE — Assessment & Plan Note (Addendum)
CTA chest concerning for pulmonary edema and pleural effusion..  BNP > 4500 but no prior value for comparison.  TTE with LVEF of 30 to 35% (worse), G2-DD (worse) and GH.  Initially diuresed with IV Lasix which was discontinued due to orthostatic hypotension.   ?-Cardiology signed off. ?-Discharged on p.o. Lasix 20 mg every other day ?-Decreased Toprol-XL from 75 to to 25 mg daily ?-Discontinue losartan and Aldactone. ?-Outpatient follow-up with cardiology in 2 to 3 weeks ?

## 2021-08-05 NOTE — Progress Notes (Signed)
Patient reports having a poor appetite, refused nutritional supplements.  Has not had BM since Tuesday.  Reports having intermittent diarrhea then taking Imodium.  Refusing laxatives, stool softeners. ? ?Angie Fava, RN  ?

## 2021-08-05 NOTE — Progress Notes (Signed)
Patient returned to room from thoracentesis.  Denies N/V.  C/o 2/10 pain at thoracentesis puncture site.  SpO2 93% on 5 L HFNC. ? ?Angie Fava, RN  ?

## 2021-08-05 NOTE — Progress Notes (Signed)
ANTICOAGULATION CONSULT NOTE ? ?Pharmacy Consult for IV heparin ?Indication: pulmonary embolus ? ?Allergies  ?Allergen Reactions  ? Sertraline Hcl Other (See Comments)  ?  Severe diarrhea  ? ? ?Patient Measurements: ?Height: 5\' 5"  (165.1 cm) ?Weight: 59.9 kg (132 lb 0.9 oz) ?IBW/kg (Calculated) : 57 ?Heparin Dosing Weight: actual body weight  ? ?Vital Signs: ?Temp: 98.6 ?F (37 ?C) (04/01 0340) ?Temp Source: Oral (04/01 0340) ?BP: 104/55 (04/01 0340) ?Pulse Rate: 75 (04/01 0340) ? ?Labs: ?Recent Labs  ?  08/03/21 ?2319 08/04/21 ?0130 08/04/21 ?0130 08/04/21 ?8563 08/04/21 ?1497 08/04/21 ?2032 08/05/21 ?0615  ?HGB 10.7*  --   --  11.7*  --   --   --   ?HCT 32.2*  --   --  34.9*  --   --   --   ?PLT 89*  --   --  89*  --   --   --   ?APTT  --  33   < >  --  200* 155* 91*  ?HEPARINUNFRC  --  >1.10*  --   --  >1.10*  --   --   ?CREATININE 0.85  --   --   --  0.94  --   --   ?TROPONINIHS 20* 22*  --   --   --   --   --   ? < > = values in this interval not displayed.  ? ? ? ?Estimated Creatinine Clearance: 47.2 mL/min (by C-G formula based on SCr of 0.94 mg/dL). ? ? ?Medical History: ?Past Medical History:  ?Diagnosis Date  ? Acute CVA (cerebrovascular accident) ([place]) 01/09/2020  ? Adenocarcinoma of left lung, stage 4 ([place]) 01/18/2020  ? Anxiety   ? Blood clots in brain   ? Chronic kidney disease due to hypertension 05/16/2020  ? Chronic kidney disease, stage 3a ([place]) 05/16/2020  ? Chronic respiratory failure ([place]) 05/16/2020  ? Cough 03/18/2020  ? Daytime somnolence 05/16/2020  ? Dizziness 09/25/2019  ? Dyslipidemia   ? Encounter for antineoplastic chemotherapy 01/18/2020  ? Essential hypertension   ? Gastro-esophageal reflux disease without esophagitis   ? Generalized weakness 03/18/2020  ? GERD (gastroesophageal reflux disease)   ? Goals of care, counseling/discussion 01/18/2020  ? Hardening of the aorta (main artery of the heart) ([place]) 09/08/2020  ? Hyperlipidemia 05/16/2020  ? Hypothyroidism   ? Hypothyroidism, unspecified   ?  Insomnia 05/16/2020  ? Intracranial bleeding ([place]) 01/09/2020  ? Left bundle branch block 06/04/2018  ? 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 Long term (current) use of anticoagulants 05/16/2020  ? Lumbar foraminal stenosis 12/30/2019  ? Lung mass 05/16/2020  ? 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 Major depression, single episode 05/16/2020  ? Malignant neoplasm of lower lobe, left bronchus or lung ([place]) 01/14/2020  ? NICM (nonischemic cardiomyopathy) ([place])   ? Nonischemic cardiomyopathy ([place]) 06/04/2018  ? Ejection fraction 45% in summer 2019  ? Nonobstructive cardiomyopathy ([place])   ? Obstructive sleep apnea syndrome   ? Recurrent falls 07/12/2020  ? Rosacea 05/16/2020  ? 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[place]) 01/28/2020  ? 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 Single subsegmental pulmonary embolism without acute cor pulmonale ([place]) 03/25/2020  ? Sleep apnea   ? Stress incontinence (female) (female) 05/16/2020  ? TIA (transient ischemic attack) 01/08/2020  ? Vertigo 09/25/2019  ? ? ?Assessment: 76 yo female with metastatic lung cancer found  to have new PE. Patient on Apixaban PTA for hx of VTE, with last dose 08/03/21 @ 0730. Platelets chronically low. Pharmacy consulted for IV heparin dosing. Baseline heparin level > 1.1, falsely elevated as expected due to recent Apixaban. Baseline aPTT 33 seconds. CBC: Hgb 10.7, Pltc 89K.  ? ?Today, 08/05/21: ?Heparin level > 1.1, remains falsely elevated due to recent Apixaban use ?aPTT 91 seconds, therapeutic on heparin infusion at 750 units/hr ?Per discussion with RN, heparin running at specified rate. Heparin infusing in right AC, and heparin level collected from left hand.  ?No bleeding or infusion issues noted per nursing ?CBC: Hgb 11.7, Pltc low but remains unchanged at 89K ? ?Goal of Therapy:  ?Heparin level 0.3-0.7 units/ml ?Monitor platelets by  anticoagulation protocol: Yes ?  ?Plan:  ?Continue IV heparin at 750 units/hr.  ?Will use aPTT for dose titration/monitoring until effects of Apixaban on heparin level have diminished ?Check confirmatory aPTT 8 hours after rate change ?Daily CBC, heparin level, aPTT ?Per oncology, ok to continue heparin as long as Pltc at least 50K ?Monitor very closely for s/sx of bleeding ?Noted oncology's recommendation to switch to Enoxaparin at discharge ? ?Netta Cedars, PharmD ?08/05/2021 6:38 AM ? ?

## 2021-08-05 NOTE — Assessment & Plan Note (Deleted)
Soft blood pressures this morning but improved. ?-Decreased IV Lasix. ?-Reduce Toprol-XL from 75 to 50 mg ?-Continue home Cozaar and Aldactone. ?-Continue monitoring ?

## 2021-08-05 NOTE — Progress Notes (Signed)
Heparin gtt discontinued per order, Lovenox subcutaneous given at 1134. ? ?Angie Fava, RN  ?

## 2021-08-05 NOTE — Progress Notes (Addendum)
ANTICOAGULATION CONSULT NOTE ? ?Pharmacy Consult for IV heparin >> enoxaparin ?Indication: pulmonary embolus ? ?Allergies  ?Allergen Reactions  ? Sertraline Hcl Other (See Comments)  ?  Severe diarrhea  ? ? ?Patient Measurements: ?Height: 5\' 5"  (165.1 cm) ?Weight: 59.9 kg (132 lb 0.9 oz) ?IBW/kg (Calculated) : 57 ?Heparin Dosing Weight: actual body weight  ? ?Vital Signs: ?Temp: 98.9 ?F (37.2 ?C) (04/01 0623) ?Temp Source: Oral (04/01 7628) ?BP: 114/66 (04/01 0853) ?Pulse Rate: 80 (04/01 0853) ? ?Labs: ?Recent Labs  ?  08/03/21 ?2319 08/03/21 ?2319 08/04/21 ?0130 08/04/21 ?3151 08/04/21 ?7616 08/04/21 ?2032 08/05/21 ?0737 08/05/21 ?0615  ?HGB 10.7*  --   --  11.7*  --   --   --  10.3*  ?HCT 32.2*  --   --  34.9*  --   --   --  31.9*  ?PLT 89*  --   --  89*  --   --   --  72*  ?APTT  --    < > 33  --  200* 155*  --  91*  ?HEPARINUNFRC  --   --  >1.10*  --  >1.10*  --  >1.10*  --   ?CREATININE 0.85  --   --   --  0.94  --   --  0.94  ?TROPONINIHS 20*  --  22*  --   --   --   --   --   ? < > = values in this interval not displayed.  ? ? ? ?Estimated Creatinine Clearance: 47.2 mL/min (by C-G formula based on SCr of 0.94 mg/dL). ? ? ?Medical History: ?Past Medical History:  ?Diagnosis Date  ? Acute CVA (cerebrovascular accident) ([place]) 01/09/2020  ? Adenocarcinoma of left lung, stage 4 ([place]) 01/18/2020  ? Anxiety   ? Blood clots in brain   ? Chronic kidney disease due to hypertension 05/16/2020  ? Chronic kidney disease, stage 3a ([place]) 05/16/2020  ? Chronic respiratory failure ([place]) 05/16/2020  ? Cough 03/18/2020  ? Daytime somnolence 05/16/2020  ? Dizziness 09/25/2019  ? Dyslipidemia   ? Encounter for antineoplastic chemotherapy 01/18/2020  ? Essential hypertension   ? Gastro-esophageal reflux disease without esophagitis   ? Generalized weakness 03/18/2020  ? GERD (gastroesophageal reflux disease)   ? Goals of care, counseling/discussion 01/18/2020  ? Hardening of the aorta (main artery of the heart) ([place]) 09/08/2020  ?  Hyperlipidemia 05/16/2020  ? Hypothyroidism   ? Hypothyroidism, unspecified   ? Insomnia 05/16/2020  ? Intracranial bleeding ([place]) 01/09/2020  ? Left bundle branch block 06/04/2018  ? 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 Long term (current) use of anticoagulants 05/16/2020  ? Lumbar foraminal stenosis 12/30/2019  ? Lung mass 05/16/2020  ? 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 Major depression, single episode 05/16/2020  ? Malignant neoplasm of lower lobe, left bronchus or lung ([place]) 01/14/2020  ? NICM (nonischemic cardiomyopathy) ([place])   ? Nonischemic cardiomyopathy ([place]) 06/04/2018  ? Ejection fraction 45% in summer 2019  ? Nonobstructive cardiomyopathy ([place])   ? Obstructive sleep apnea syndrome   ? Recurrent falls 07/12/2020  ? Rosacea 05/16/2020  ? 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[place]) 01/28/2020  ? 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 Single subsegmental pulmonary embolism without acute cor pulmonale ([place]) 03/25/2020  ? Sleep apnea   ? Stress incontinence (female) (female) 05/16/2020  ? TIA (transient ischemic attack) 01/08/2020  ?  Vertigo 09/25/2019  ? ? ?Assessment: 76 yo female with metastatic lung cancer found to have new PE. Patient on Apixaban PTA for hx of VTE, with last dose 08/03/21 @ 0730. Platelets chronically low. Pharmacy consulted for IV heparin dosing. Baseline heparin level > 1.1, falsely elevated as expected due to recent Apixaban. Baseline aPTT 33 seconds. CBC: Hgb 10.7, Pltc 89K.  ? ?Today, 08/05/21:.  ?No bleeding or infusion issues noted per nursing ?CBC: Hgb 11.7, Pltc low but remains unchanged at 89K ?Plan is to switch heparin to enoxaparin  ? ?Goal of Therapy:  ?Heparin level 0.3-0.7 units/ml ?Monitor platelets by anticoagulation protocol: Yes ?  ?Plan:  ?Stop heparin drip and start enoxaparin 90 mg SQ q24h ( 1.5 mg/kg daily as per oncology note recommendations on 3/31)   ?Per oncology, ok to continue heparin as long as Pltc at least 50K ?Monitor very closely for s/sx of bleeding ? ? ?Royetta Asal, PharmD, BCPS ?08/05/2021 11:07 AM ? ?

## 2021-08-05 NOTE — Progress Notes (Signed)
?PROGRESS NOTE ? ?Stefanie Camacho DXI:[id number] DOB: [birth year]  ? ?PCP: [name], MD ? ?Patient is from: ILF. ? ?DOA: 08/03/2021 LOS: 0 ? ?Chief complaints ?Chief Complaint  ?Patient presents with  ? Shortness of Breath  ?  ? 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 Earlie Server, pulmonary embolism on Eliquis, combined CHF/NICM, CVA, CKD-3A, OSA not on CPAP, hypothyroidism, anxiety, depression and debility presenting from ILF with shortness of breath and fatigue and found to have acute subsegmental PE in RLL.  CTA chest also concerning for progressive pulmonary and and widespread osseous mets and moderate pulmonary edema with associated bilateral pleural effusion, left> right with associated sepsis total collapse of the LLL.  Patient was started on IV heparin and IV Lasix.  TTE ordered.  Oncology consulted.  Concern about Eliquis failure.  Oncology recommended Lovenox on discharge. ? ?TTE with LVEF of 30 to 35%, G2 DD and GH (worse than prior).  Cardiology consulted and gave recommendation and signed off.   ? ?Patient requiring up to 5 L to maintain saturation at 90% at rest.  Thoracocentesis requested.  ? ?Subjective: ?Seen and examined earlier this morning.  No major events overnight of this morning.  She reports feeling lightheaded and leg weakness when she gets up.  Also reports DOE.  Denies shortness of breath at rest.  Denies chest pain, orthopnea, PND, edema, GI or UTI symptoms. ? ?Objective: ?Vitals:  ? 08/04/21 2138 08/05/21 0340 08/05/21 0853 08/05/21 1126  ?BP: (!) 104/58 (!) 104/55 114/66   ?Pulse: 85 75 80   ?Resp:  18 20   ?Temp:  98.6 ?F (37 ?C) 98.9 ?F (37.2 ?C)   ?TempSrc:  Oral Oral   ?SpO2:  94% 99% 90%  ?Weight:  59.9 kg    ?Height:      ? ? ?Examination: ? ?GENERAL: No apparent distress.  Nontoxic. ?HEENT: MMM.  Vision and  hearing grossly intact.  ?NECK: Supple.  No apparent JVD.  ?RESP: 90% on 5 L.  No IWOB.  Diminished aeration in left lung. ?CVS:  RRR. Heart sounds normal.  ?ABD/GI/GU: BS+. Abd soft, NTND.  ?MSK/EXT:  Moves extremities. No apparent deformity. No edema.  ?SKIN: no apparent skin lesion or wound ?NEURO: Awake, alert and oriented appropriately.  No apparent focal neuro deficit. ?PSYCH: Calm. Normal affect.  ? ? ?Procedures:  ?None ? ?Microbiology summarized: ?COVID-19 and influenza PCR nonreactive. ? ?Assessment and Plan: ?* Acute pulmonary embolism ([place]) ?Acute subsegmental PE in RLL noted on CTA chest.  Patient reports good compliance with Eliquis which is concerning for Eliquis failure.  Oncology consulted and recommended subcu Lovenox.  No radiologic evidence of right heart strain. ?-Transition to subcu Lovenox per pharmacy ? ?Acute respiratory failure with hypoxia ([place]) ?Multifactorial including underlying CHF, pleural effusion, lung cancer, possible drug-induced pneumonitis from Tagrisso and possibly PE although small.  Requiring 5 L to maintain saturation at 90% at rest.  Patient is not on oxygen at home. ?-Manage CHF, pleural effusion and PE as below. ?-Holding Tagrisso per oncology while in the hospital ?-Wean oxygen as able ?-Incentive spirometry, OOB, PT/OT ? ?Bilateral pleural effusion ?CTA chest showed bilateral pleural effusion, left> right.  Could be from extensive lung cancer and CHF.  See picture above. ?-IR thoracocentesis ordered. ?-Check pleural LDH and protein ? 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  BNP > 4500 but no prior value for comparison.  TTE with LVEF of 30 to 35% (worse), G2-DD (worse) and GH.  Started on IV Lasix 40 mg twice daily.  Only 800 cc urine charted from overnight.  She denies orthopnea, PND or edema.  Appears euvolemic on exam.  However, requiring up to 5 L to maintain minimum saturation. ?-Cardiology gave  recommendation and signed off. ?-GDMT-Toprol-XL, losartan, Aldactone ?-Monitor I&O, respiratory status, renal functions and electrolytes ?-Sodium and fluid restriction ? ?Thrombocytopenia ([place]) ?Recent Labs  ?Lab 08/03/21 ?2319 08/04/21 ?8921 08/05/21 ?0615  ?PLT 89* 89* 72*  ?-Continue monitoring ? ? ?Adenocarcinoma of left lung, stage 4 ([place]) ?S/p whole brain radiation andradiation.  Currently on Tagrisso.  Followed by oncology.  CTA with progressive pulmonary metastasis and widespread osseous metastasis ?-Oncology recommends holding Tagrisso for few days during hospitalization for possible drug induced pneumonitis ? ?Anxiety ?Continue Prozac ? ?Hypothyroidism, unspecified ?Continue home Synthroid ? ?Essential hypertension ?Soft blood pressures this morning but improved. ?-Decreased IV Lasix. ?-Reduce Toprol-XL from 75 to 50 mg ?-Continue home Cozaar and Aldactone. ?-Continue monitoring ? ? ? ?DVT prophylaxis:  ?Patient is on full anticoagulation for PE ? ?Code Status: DNR/DNI ?Family Communication: Patient and/or RN. Available if any question.  ?Level of care: Progressive ?Status is: Observation ?The patient will require care spanning > 2 midnights and should be moved to inpatient because: Acute respiratory failure requiring significant oxygen due to acute PE, CHF, pleural effusion and possible pneumonitis ? ? ?Final disposition: TBD. ? ?Consultants:  ?Oncology ?Cardiology ? ?Sch Meds:  ?Scheduled Meds: ? atorvastatin  10 mg Oral Daily  ? enoxaparin (LOVENOX) injection  1.5 mg/kg Subcutaneous Daily  ? FLUoxetine  20 mg Oral Daily  ? [START ON 08/06/2021] furosemide  20 mg Oral Daily  ? levETIRAcetam  250 mg Oral BID  ? levothyroxine  75 mcg Oral Q0600  ? losartan  25 mg Oral Daily  ? mouth rinse  15 mL Mouth Rinse BID  ? metoprolol succinate  50 mg Oral Daily  ? pantoprazole  40 mg Oral QPM  ? potassium chloride  40 mEq Oral Daily  ? spironolactone  12.5 mg Oral Daily  ? ?Continuous Infusions: ?PRN  Meds:.acetaminophen **OR** acetaminophen, oxyCODONE, phenol, polyvinyl alcohol ? ?Antimicrobials: ?Anti-infectives (From admission, onward)  ? ? None  ? ?  ? ? ? ?I have personally reviewed the following labs and images: ?CBC: ?Recent Labs  ?Lab 08/03/21 ?2319 08/04/21 ?1941 08/05/21 ?0615  ?WBC 5.4 6.5 5.4  ?NEUTROABS 4.1  --   --   ?HGB 10.7* 11.7* 10.3*  ?HCT 32.2* 34.9* 31.9*  ?MCV 90.4 92.3 95.2  ?PLT 89* 89* 72*  ? ?BMP &GFR ?Recent Labs  ?Lab 08/03/21 ?2319 08/04/21 ?7408 08/05/21 ?0615  ?NA 137 136 135  ?K 3.5 3.4* 3.8  ?CL 105 103 102  ?CO2 20* 22 25  ?GLUCOSE 103* 128* 100*  ?BUN 24* 23 23  ?CREATININE 0.85 0.94 0.94  ?CALCIUM 9.1 8.6* 8.7*  ?MG  --  1.4* 2.4  ? ?Estimated Creatinine Clearance: 47.2 mL/min (by C-G formula based on SCr of 0.94 mg/dL). ?Liver & Pancreas: ?Recent Labs  ?Lab 08/03/21 ?2319 08/04/21 ?1448 08/05/21 ?0615  ?AST 14* 17 16  ?ALT 9 11 10   ?ALKPHOS 303* 345* 284*  ?BILITOT 0.7 0.8 0.6  ?PROT 6.6 6.9 6.1*  ?ALBUMIN 3.8 3.6 3.2*  ? ?No results for input(s): LIPASE, AMYLASE in the last 168 hours. ?No  results for input(s): AMMONIA in the last 168 hours. ?Diabetic: ?No results for input(s): HGBA1C in the last 72 hours. ?No results for input(s): GLUCAP in the last 168 hours. ?Cardiac Enzymes: ?No results for input(s): CKTOTAL, CKMB, CKMBINDEX, TROPONINI in the last 168 hours. ?No results for input(s): PROBNP in the last 8760 hours. ?Coagulation Profile: ?No results for input(s): INR, PROTIME in the last 168 hours. ?Thyroid Function Tests: ?No results for input(s): TSH, T4TOTAL, FREET4, T3FREE, THYROIDAB in the last 72 hours. ?Lipid Profile: ?No results for input(s): CHOL, HDL, LDLCALC, TRIG, CHOLHDL, LDLDIRECT in the last 72 hours. ?Anemia Panel: ?No results for input(s): VITAMINB12, FOLATE, FERRITIN, TIBC, IRON, RETICCTPCT in the last 72 hours. ?Urine analysis: ?   ?Component Value Date/Time  ? COLORURINE AMBER (A) 07/25/2020 1240  ? APPEARANCEUR CLOUDY (A) 07/25/2020 1240  ? LABSPEC  1.024 07/25/2020 1240  ? PHURINE 5.0 07/25/2020 1240  ? GLUCOSEU NEGATIVE 07/25/2020 1240  ? HGBUR SMALL (A) 07/25/2020 1240  ? [place] NEGATIVE 07/25/2020 1240  ? KETONESUR 5 (A) 07/25/2020 1240  ? PROTEINUR 30 (A) 07/26/18

## 2021-08-05 NOTE — Assessment & Plan Note (Signed)
-   Continue home Synthroid °

## 2021-08-05 NOTE — Assessment & Plan Note (Addendum)
Acute subsegmental PE in RLL noted on CTA chest.  Patient reports good compliance with Eliquis which is concerning for Eliquis failure.  Oncology consulted and recommended subcu Lovenox.  No radiologic evidence of right heart strain.  LE Doppler negative for DVT. ?-Transitioned to subcu Lovenox per oncology ?

## 2021-08-06 DIAGNOSIS — K5900 Constipation, unspecified: Secondary | ICD-10-CM

## 2021-08-06 DIAGNOSIS — R531 Weakness: Secondary | ICD-10-CM | POA: Diagnosis not present

## 2021-08-06 DIAGNOSIS — I509 Heart failure, unspecified: Secondary | ICD-10-CM

## 2021-08-06 DIAGNOSIS — Z515 Encounter for palliative care: Secondary | ICD-10-CM

## 2021-08-06 DIAGNOSIS — J9601 Acute respiratory failure with hypoxia: Secondary | ICD-10-CM | POA: Diagnosis not present

## 2021-08-06 DIAGNOSIS — C3492 Malignant neoplasm of unspecified part of left bronchus or lung: Secondary | ICD-10-CM | POA: Diagnosis not present

## 2021-08-06 DIAGNOSIS — Z7189 Other specified counseling: Secondary | ICD-10-CM | POA: Diagnosis not present

## 2021-08-06 DIAGNOSIS — I951 Orthostatic hypotension: Secondary | ICD-10-CM

## 2021-08-06 DIAGNOSIS — I2699 Other pulmonary embolism without acute cor pulmonale: Secondary | ICD-10-CM | POA: Diagnosis not present

## 2021-08-06 DIAGNOSIS — I5043 Acute on chronic combined systolic (congestive) and diastolic (congestive) heart failure: Secondary | ICD-10-CM | POA: Diagnosis not present

## 2021-08-06 DIAGNOSIS — R0602 Shortness of breath: Secondary | ICD-10-CM

## 2021-08-06 LAB — MAGNESIUM: Magnesium: 2.1 mg/dL (ref 1.7–2.4)

## 2021-08-06 LAB — CBC
HCT: 29.8 % — ABNORMAL LOW (ref 36.0–46.0)
Hemoglobin: 9.8 g/dL — ABNORMAL LOW (ref 12.0–15.0)
MCH: 30.4 pg (ref 26.0–34.0)
MCHC: 32.9 g/dL (ref 30.0–36.0)
MCV: 92.5 fL (ref 80.0–100.0)
Platelets: 73 10*3/uL — ABNORMAL LOW (ref 150–400)
RBC: 3.22 MIL/uL — ABNORMAL LOW (ref 3.87–5.11)
RDW: 14.9 % (ref 11.5–15.5)
WBC: 4.7 10*3/uL (ref 4.0–10.5)
nRBC: 0 % (ref 0.0–0.2)

## 2021-08-06 LAB — FERRITIN: Ferritin: 697 ng/mL — ABNORMAL HIGH (ref 11–307)

## 2021-08-06 LAB — IRON AND TIBC
Iron: 18 ug/dL — ABNORMAL LOW (ref 28–170)
Saturation Ratios: 9 % — ABNORMAL LOW (ref 10.4–31.8)
TIBC: 199 ug/dL — ABNORMAL LOW (ref 250–450)
UIBC: 181 ug/dL

## 2021-08-06 LAB — RETICULOCYTES
Immature Retic Fract: 13.5 % (ref 2.3–15.9)
RBC.: 3.21 MIL/uL — ABNORMAL LOW (ref 3.87–5.11)
Retic Count, Absolute: 40.4 10*3/uL (ref 19.0–186.0)
Retic Ct Pct: 1.3 % (ref 0.4–3.1)

## 2021-08-06 LAB — RENAL FUNCTION PANEL
Albumin: 2.9 g/dL — ABNORMAL LOW (ref 3.5–5.0)
Anion gap: 9 (ref 5–15)
BUN: 30 mg/dL — ABNORMAL HIGH (ref 8–23)
CO2: 26 mmol/L (ref 22–32)
Calcium: 9.1 mg/dL (ref 8.9–10.3)
Chloride: 100 mmol/L (ref 98–111)
Creatinine, Ser: 0.96 mg/dL (ref 0.44–1.00)
GFR, Estimated: >60 mL/min (ref >60)
Glucose, Bld: 95 mg/dL (ref 70–99)
Phosphorus: 2.9 mg/dL (ref 2.5–4.6)
Potassium: 4 mmol/L (ref 3.5–5.1)
Sodium: 135 mmol/L (ref 135–145)

## 2021-08-06 LAB — VITAMIN B12: Vitamin B-12: 381 pg/mL (ref 180–914)

## 2021-08-06 LAB — FOLATE: Folate: 5.6 ng/mL — ABNORMAL LOW (ref >5.9)

## 2021-08-06 LAB — BRAIN NATRIURETIC PEPTIDE: B Natriuretic Peptide: 861.7 pg/mL — ABNORMAL HIGH (ref 0.0–100.0)

## 2021-08-06 MED ORDER — SENNOSIDES-DOCUSATE SODIUM 8.6-50 MG PO TABS: 1.0000 | ORAL_TABLET | Freq: Two times a day (BID) | ORAL | Status: DC | PRN

## 2021-08-06 MED ORDER — POLYETHYLENE GLYCOL 3350 17 G PO PACK
17.0000 g | PACK | Freq: Two times a day (BID) | ORAL | Status: DC | PRN
Start: 2021-08-06 — End: 2021-08-08

## 2021-08-06 NOTE — Progress Notes (Signed)
RN spoke with MD and two of patient's sons Lanny Hurst and Randall Hiss) about scheduling a family meeting regarding plan of care for patient at bedside for tomorrow (Monday 4/3).  All parties agreed on 1300. ? ?Angie Fava, RN  ?

## 2021-08-06 NOTE — Assessment & Plan Note (Addendum)
Continue bowel regimen. ?

## 2021-08-06 NOTE — Assessment & Plan Note (Addendum)
DNR/DNI.  Palliative medicine consulted.  Recommended palliative follow-up at SNF ?

## 2021-08-06 NOTE — Progress Notes (Signed)
Patient reported feeling "off-balance" while standing.  MD aware. ? ? ? 08/06/21 1135  ?Orthostatic Lying   ?BP- Lying 94/60  ?Pulse- Lying 76  ?Orthostatic Sitting  ?BP- Sitting 97/56  ?Pulse- Sitting 81  ?Orthostatic Standing at 0 minutes  ?BP- Standing at 0 minutes (!) 77/49  ?Pulse- Standing at 0 minutes 100  ? ? ?

## 2021-08-06 NOTE — Progress Notes (Signed)
Patient had total of 300 mL urine output during shift and 360 fluid intake.  Encouraged fluids throughout shift.  Patient c/o of decreased appetite.  Patient ate 90% of breakfast, 25% of lunch.  Patient refusing Ensure and Boost.  MD made aware. ? ?Angie Fava, RN   ?

## 2021-08-06 NOTE — Assessment & Plan Note (Addendum)
Likely from progressive CHF and cardiac meds.  TTE as above.  Resolved after adjusting cardiac meds. ?-Adjusted cardiac meds as above. ?-Continue TED hose and head of bed elevation to greater than 30 degree ?-Encourage staying out of the bed ?

## 2021-08-06 NOTE — Consult Note (Signed)
? ?                                                                                ?Consultation Note ?Date: 08/06/2021  ? ?Patient Name: Stefanie Camacho  ?DOB: 08-Nov-1945  MRN: [id number]  Age / Sex: 76 y.o., female  ?PCP: [name], MD ?Referring Physician: Mercy Riding, MD ? ?Reason for Consultation: Establishing goals of care ? ?HPI/Patient Profile: 76 y.o. female  with past medical history of  metastatic lung cancer, prior CVA, nonischemic cardiomyopathy, hyperlipidemia, hypertension, hypothyroidism  admitted on 08/03/2021 with Acute PE, acute hypoxic resp failure, bilateral pleural effusion .  ? ?Clinical Assessment and Goals of Care: ? 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 Earlie Server, pulmonary embolism on Eliquis, combined CHF/NICM, CVA, CKD-3A, OSA not on CPAP, hypothyroidism, anxiety, depression and debility presenting from ILF with shortness of breath and fatigue and found to have acute subsegmental PE in RLL.  CTA chest also concerning for progressive pulmonary and and widespread osseous mets and moderate pulmonary edema with associated bilateral pleural effusion, left> right with associated sepsis total collapse of the LLL.  Patient was started on IV heparin and IV Lasix.  TTE ordered.  Oncology consulted.  Concern about Eliquis failure.  Oncology recommended Lovenox on discharge. ?  ?TTE with LVEF of 30 to 35%, G2 DD and GH (worse than prior).  Cardiology consulted and gave recommendation and signed off.   ?  ?On 08-05-21, the patient was requiring up to 5 L to maintain saturation at 90% at rest.  Thoracocentesis requested. patient was seen by IR and on 08-05-21 she underwent successful US guided left thoracentesis. ?Yielded 1.2 L of clear yellow fluid.  ?  ?Patient was seen by PMT in a previous hospitalization. She has completed advance care planning documents.   ? ?Patient is resting in bed, states that the work of breathing is much better, denies chest pain, appears with generalized weakness, son is at bedside.  ? ?Palliative medicine is specialized medical care for people living with serious illness. It focuses on providing relief from the symptoms and stress of a serious illness. The goal is to improve quality of life for both the patient and the family. ?Goals of care: Broad aims of medical therapy in relation to the patient's values and preferences. Our aim is to provide medical care aimed at enabling patients to achieve the goals that matter most to them, given the circumstances of their particular medical situation and their constraints.  ? ?See further discussions below.  ? ?HCPOA ?  ? ?SUMMARY OF RECOMMENDATIONS   ? Today at the time of the initial consult, we compared and contrasted between hospice and palliative care. We talked about returning back to [facility] with palliative support, continuation of physical therapy over there and following up with medical oncology in the outpatient setting. Alternatively, we talked about enrolling in hospice services and taking a comfort-focused approach to her care going forward. Patient wishes to discuss further with her oncologist Dr Earlie Server. Will reach out to med onc  Dr Mohammed/Kristen NP with oncology on 08-07-21 to further discuss.  ?Continue current mode of care.  ?Agree with DNR.  ?Will need home O2 on discharge, currently on 3 L O2 [place], did not have Oxygen prior to this hospitalization.  ? ?Code Status/Advance Care Planning: ?DNR ? ? ?Symptom Management:  ? ? ?Palliative Prophylaxis:  ?Delirium Protocol ? ?Psycho-social/Spiritual:  ?Desire for further Chaplaincy support:yes ?Additional Recommendations: Education on Hospice ? ?Prognosis:  ?Unable to determine ? ?Discharge Planning: To Be Determined  ? ?  ? ?Primary Diagnoses: ?Present on Admission: ? Acute pulmonary embolism ([place]) ? Essential hypertension ?  Dyslipidemia ? Hypothyroidism, unspecified ? Anxiety ? Adenocarcinoma of left lung, stage 4 ([place]) ? ? ?I have reviewed the medical record, interviewed the patient and family, and examined the patient. The following aspects are pertinent. ? ?Past Medical History:  ?Diagnosis Date  ? Acute CVA (cerebrovascular accident) ([place]) 01/09/2020  ? Adenocarcinoma of left lung, stage 4 ([place]) 01/18/2020  ? Anxiety   ? Blood clots in brain   ? Chronic kidney disease due to hypertension 05/16/2020  ? Chronic kidney disease, stage 3a ([place]) 05/16/2020  ? Chronic respiratory failure ([place]) 05/16/2020  ? Cough 03/18/2020  ? Daytime somnolence 05/16/2020  ? Dizziness 09/25/2019  ? Dyslipidemia   ? Encounter for antineoplastic chemotherapy 01/18/2020  ? Essential hypertension   ? Gastro-esophageal reflux disease without esophagitis   ? Generalized weakness 03/18/2020  ? GERD (gastroesophageal reflux disease)   ? Goals of care, counseling/discussion 01/18/2020  ? Hardening of the aorta (main artery of the heart) ([place]) 09/08/2020  ? Hyperlipidemia 05/16/2020  ? Hypothyroidism   ? Hypothyroidism, unspecified   ? Insomnia 05/16/2020  ? Intracranial bleeding ([place]) 01/09/2020  ? Left bundle branch block 06/04/2018  ? 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 Long term (current) use of anticoagulants 05/16/2020  ? Lumbar foraminal stenosis 12/30/2019  ? Lung mass 05/16/2020  ? 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 Major depression, single episode 05/16/2020  ? Malignant neoplasm of lower lobe, left bronchus or lung ([place]) 01/14/2020  ? NICM (nonischemic cardiomyopathy) ([place])   ? Nonischemic cardiomyopathy ([place]) 06/04/2018  ? Ejection fraction 45% in summer 2019  ? Nonobstructive cardiomyopathy ([place])   ? Obstructive sleep apnea syndrome   ? Recurrent falls 07/12/2020  ? Rosacea 05/16/2020  ? 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 Single subsegmental pulmonary embolism without acute cor pulmonale ([place]) 03/25/2020  ? Sleep apnea   ? Stress incontinence (female) (female) 05/16/2020  ? TIA (transient ischemic attack) 01/08/2020  ? Vertigo 09/25/2019  ? ?Social History  ? ?Socioeconomic History  ? Marital status: Divorced  ?  Spouse name: Not on file  ? Number of children: Not on file  ? Years of education: Not on file  ? Highest education level: Not on file  ?Occupational History  ? Not on file  ?Tobacco Use  ? Smoking status: Never  ?  Passive exposure: Never  ? Smokeless tobacco: Never  ?Vaping Use  ? Vaping Use: Never used  ?Substance and Sexual Activity  ? Alcohol use: Not Currently  ? Drug use: Never  ? Sexual activity: Not on file  ?Other Topics Concern  ? Not on file  ?Social History Narrative  ? Not on file  ? ?Social Determinants of Health  ? ?Financial Resource Strain: Not on  file  ?Food Insecurity: Not on file  ?Transportation Needs: Not on file  ?Physical Activity: Not on file  ?Stress: Not on file  ?Social Connections: Not on file  ? ?Family History  ?Problem Relation Age of Onset  ? Heart disease Mother   ? Stroke Mother   ? Heart disease Father   ? Stroke Father   ? Heart disease Maternal Grandfather   ? Heart disease Paternal Grandfather   ? ?Scheduled Meds: ? atorvastatin  10 mg Oral Daily  ? enoxaparin (LOVENOX) injection  1.5 mg/kg Subcutaneous Daily  ? FLUoxetine  20 mg Oral Daily  ? levETIRAcetam  250 mg Oral BID  ? levothyroxine  75 mcg Oral Q0600  ? mouth rinse  15 mL Mouth Rinse BID  ? metoprolol succinate  25 mg Oral Daily  ? pantoprazole  40 mg Oral QPM  ? potassium chloride  40 mEq Oral Daily  ? ?Continuous Infusions: ?PRN Meds:.acetaminophen **OR** acetaminophen, oxyCODONE, phenol, polyvinyl alcohol ?Medications Prior to Admission:  ?Prior to Admission medications   ?Medication Sig Start Date End Date Taking? Authorizing Provider  ?apixaban (ELIQUIS) 5 MG TABS tablet Take 1  tablet (5 mg total) by mouth 2 (two) times daily. 05/30/20  Yes Park Liter, MD  ?atorvastatin (LIPITOR) 10 MG tablet Take 10 mg by mouth daily. 06/22/21  Yes [provider]  ?Ensure (ENSURE) Take 1 Can by

## 2021-08-06 NOTE — Progress Notes (Signed)
?PROGRESS NOTE ? ?Stefanie Camacho PJA:[id number] DOB: February 27, 1946  ? ?PCP: Stefanie Ada, MD ? ?Patient is from: [facility]. ? ?DOA: 08/03/2021 LOS: 1 ? ?Chief complaints ?Chief Complaint  ?Patient presents with  ? Shortness of Breath  ?  ? 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 Earlie Server, pulmonary embolism on Eliquis, combined CHF/NICM, CVA, CKD-3A, OSA not on CPAP, hypothyroidism, anxiety, depression and debility presenting from [facility] with shortness of breath and fatigue and found to have acute subsegmental PE in RLL.  CTA chest also concerning for progressive pulmonary and and widespread osseous mets and moderate pulmonary edema with associated bilateral pleural effusion, left> right with associated sepsis total collapse of the LLL.  Patient was started on IV heparin and IV Lasix.  TTE ordered.  Oncology consulted.  Concern about Eliquis failure.  Oncology recommended Lovenox on discharge. ? ?TTE with LVEF of 30 to 35%, G2 DD and GH (worse than prior).  Cardiology consulted and gave recommendations and signed off.  Patient has significant orthostatic hypotension.  Metoprolol reduced.  Other cardiac meds discontinued.  ? ?Patient had thoracocentesis with removal of 1.2 L exudative fluid from left chest on 4/1. ? ?Overall, poor long-term prognosis with progressive CHF, lung cancer, pleural effusion, pulmonary embolism, thrombocytopenia, orthostatic hypotension and other comorbidities.  Palliative medicine consulted.  ? ?Subjective: ?Seen and examined earlier this morning.  No major events overnight or this morning.  Reports some improvement in her breathing.  Still requiring 3 L to maintain appropriate saturation.  No other complaints.  Orthostatic vitals remain positive.  Extensive discussion about goal of care. ? ?Objective: ?Vitals:  ? 08/06/21 0500  08/06/21 1145 08/06/21 1148 08/06/21 1332  ?BP:  (!) 89/55 (!) 95/54 103/69  ?Pulse:    81  ?Resp:    18  ?Temp:    98 ?F (36.7 ?C)  ?TempSrc:    Oral  ?SpO2:    96%  ?Weight: 56.5 kg     ?Height:      ? ? ?Examination: ? ?GENERAL: No apparent distress.  Nontoxic. ?HEENT: MMM.  Vision and hearing grossly intact.  ?NECK: Supple.  No apparent JVD.  ?RESP:  No IWOB.  Fair aeration bilaterally.  Improved aeration in left lung. ?CVS:  RRR. Heart sounds normal.  ?ABD/GI/GU: BS+. Abd soft, NTND.  ?MSK/EXT:  Moves extremities. No apparent deformity. No edema.  ?SKIN: no apparent skin lesion or wound ?NEURO: Awake and alert. Oriented appropriately.  No apparent focal neuro deficit. ?PSYCH: Calm. Normal affect.  ? ? ?Procedures:  ?None ? ?Microbiology summarized: ?COVID-19 and influenza PCR nonreactive. ? ?Assessment and Plan: ?* Acute pulmonary embolism ([place]) ?Acute subsegmental PE in RLL noted on CTA chest.  Patient reports good compliance with Eliquis which is concerning for Eliquis failure.  Oncology consulted and recommended subcu Lovenox.  No radiologic evidence of right heart strain.  LE Doppler negative for DVT. ?-Transitioned to subcu Lovenox per oncology ? ?Acute respiratory failure with hypoxia ([place]) ?Multifactorial including underlying CHF, pleural effusion, lung cancer, possible drug-induced pneumonitis from Tagrisso and possibly PE although small.  Desaturated to 81% on RA at rest.  Required up to 5 L.  Now saturating in mid 90s on 3 L. ?-Manage CHF, pleural effusion and PE as below. ?-Holding Tagrisso per oncology while in the hospital ?-Wean oxygen as able ?-Incentive spirometry, OOB, PT/OT ? ?  Orthostatic hypotension in patient with history of hypertension ?Likely from progressive CHF and cardiac meds.  TTE as above. ?-Discontinued cardiac meds and reduce Toprol-XL to 25 mg daily ?-TED hose, elevate head of bed ?-Daily orthostatic vitals. ?-May consider midodrine if no improvement with the above  measures. ? ?Bilateral pleural effusion ?CTA chest showed bilateral pleural effusion, left> right.  Could be from extensive lung cancer and CHF.  See picture above. ?-IR thoracocentesis ordered. ?-Check pleural LDH and protein ? ?Goals of care, counseling/discussion ?Discussed my concern about her long-term poor prognosis with patient and one of patient's son in light of her significant comorbidity including progressive heart failure, lung cancer, orthostatic hypotension, respiratory failure, PE with thrombocytopenia and physical deconditioning.  She is DNR and DNI which is appropriate.  They voiced understanding.  ?-Palliative care consulted-appreciate input. ? 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  BNP > 4500 but no prior value for comparison.  TTE with LVEF of 30 to 35% (worse), G2-DD (worse) and GH.  Started on IV Lasix and became hypotensive with orthostatic hypotension. She denies orthopnea, PND or edema.  Appears euvolemic on exam.  However, respiratory failure with significant oxygen requirement. ?-Cardiology gave recommendation and signed off. ?-Discontinue Lasix, losartan and Aldactone, reduce Toprol-XL given orthostatic hypotension. ?-Monitor I&O, respiratory status, renal functions and electrolytes ?-Sodium and fluid restriction ? ?Thrombocytopenia ([place]) ?Recent Labs  ?Lab 08/03/21 ?2319 08/04/21 ?0353 08/05/21 ?0615 08/06/21 ?4403  ?PLT 89* 89* 72* 73*  ?Stable.  Continue monitoring ? ? ?Adenocarcinoma of left lung, stage 4 ([place]) ?S/p whole brain radiation andradiation.  Currently on Tagrisso.  Followed by oncology.  CTA with progressive pulmonary metastasis and widespread osseous metastasis ?-Oncology: Hold Tagrisso for few days during hospitalization for possible drug induced pneumonitis ?-Outpatient follow-up for discussion about further treatment options ? ?Constipation ?Bowel regimen ordered. ? ?Anxiety ?Continue  Prozac ? ?Hypothyroidism, unspecified ?Continue home Synthroid ? ? ? ?DVT prophylaxis:  ?Place TED hose Start: 08/06/21 0842 ?Patient is on full anticoagulation for PE ? ?Code Status: DNR/DNI ?Family Communication: Updated patient's son in person. ?Level of care: Progressive ?Status is: Inpatient ?The patient will remain inpatient because: Acute respiratory failure requiring significant oxygen, orthostatic hypotension... ? ? ?Final disposition: TBD. ? ?Consultants:  ?Oncology ?Cardiology-signed off ?Palliative medicine ? ?Sch Meds:  ?Scheduled Meds: ? atorvastatin  10 mg Oral Daily  ? enoxaparin (LOVENOX) injection  1.5 mg/kg Subcutaneous Daily  ? FLUoxetine  20 mg Oral Daily  ? levETIRAcetam  250 mg Oral BID  ? levothyroxine  75 mcg Oral Q0600  ? mouth rinse  15 mL Mouth Rinse BID  ? metoprolol succinate  25 mg Oral Daily  ? pantoprazole  40 mg Oral QPM  ? potassium chloride  40 mEq Oral Daily  ? ?Continuous Infusions: ?PRN Meds:.acetaminophen **OR** acetaminophen, oxyCODONE, phenol, polyethylene glycol, polyvinyl alcohol, senna-docusate ? ?Antimicrobials: ?Anti-infectives (From admission, onward)  ? ? None  ? ?  ? ? ? ?I have personally reviewed the following labs and images: ?CBC: ?Recent Labs  ?Lab 08/03/21 ?2319 08/04/21 ?0353 08/05/21 ?0615 08/06/21 ?4742  ?WBC 5.4 6.5 5.4 4.7  ?NEUTROABS 4.1  --   --   --   ?HGB 10.7* 11.7* 10.3* 9.8*  ?HCT 32.2* 34.9* 31.9* 29.8*  ?MCV 90.4 92.3 95.2 92.5  ?PLT 89* 89* 72* 73*  ? ?BMP &GFR ?Recent Labs  ?Lab 08/03/21 ?2319 08/04/21 ?5956 08/05/21 ?0615 08/06/21 ?3875  ?NA 137 136 135 135  ?K  3.5 3.4* 3.8 4.0  ?CL 105 103 102 100  ?CO2 20* 22 25 26   ?GLUCOSE 103* 128* 100* 95  ?BUN 24* 23 23 30*  ?CREATININE 0.85 0.94 0.94 0.96  ?CALCIUM 9.1 8.6* 8.7* 9.1  ?MG  --  1.4* 2.4 2.1  ?PHOS  --   --   --  2.9  ? ?Estimated Creatinine Clearance: 45.9 mL/min (by C-G formula based on SCr of 0.96 mg/dL). ?Liver & Pancreas: ?Recent Labs  ?Lab 08/03/21 ?2319 08/04/21 ?2080 08/05/21 ?0615  08/06/21 ?2233  ?AST 14* 17 16  --   ?ALT 9 11 10   --   ?ALKPHOS 303* 345* 284*  --   ?BILITOT 0.7 0.8 0.6  --   ?PROT 6.6 6.9 6.1*  --   ?ALBUMIN 3.8 3.6 3.2* 2.9*  ? ?No results for input(s): LIPASE, AMYLASE in the last 168 hours. ?No

## 2021-08-07 DIAGNOSIS — Z7189 Other specified counseling: Secondary | ICD-10-CM | POA: Diagnosis not present

## 2021-08-07 DIAGNOSIS — I2699 Other pulmonary embolism without acute cor pulmonale: Secondary | ICD-10-CM | POA: Diagnosis not present

## 2021-08-07 DIAGNOSIS — I951 Orthostatic hypotension: Secondary | ICD-10-CM | POA: Diagnosis not present

## 2021-08-07 DIAGNOSIS — E440 Moderate protein-calorie malnutrition: Secondary | ICD-10-CM | POA: Insufficient documentation

## 2021-08-07 DIAGNOSIS — C3492 Malignant neoplasm of unspecified part of left bronchus or lung: Secondary | ICD-10-CM | POA: Diagnosis not present

## 2021-08-07 DIAGNOSIS — I5043 Acute on chronic combined systolic (congestive) and diastolic (congestive) heart failure: Secondary | ICD-10-CM | POA: Diagnosis not present

## 2021-08-07 LAB — RENAL FUNCTION PANEL
Albumin: 3.1 g/dL — ABNORMAL LOW (ref 3.5–5.0)
Anion gap: 8 (ref 5–15)
BUN: 32 mg/dL — ABNORMAL HIGH (ref 8–23)
CO2: 24 mmol/L (ref 22–32)
Calcium: 9.2 mg/dL (ref 8.9–10.3)
Chloride: 102 mmol/L (ref 98–111)
Creatinine, Ser: 0.84 mg/dL (ref 0.44–1.00)
GFR, Estimated: >60 mL/min (ref >60)
Glucose, Bld: 93 mg/dL (ref 70–99)
Phosphorus: 2.8 mg/dL (ref 2.5–4.6)
Potassium: 4.1 mmol/L (ref 3.5–5.1)
Sodium: 134 mmol/L — ABNORMAL LOW (ref 135–145)

## 2021-08-07 LAB — CBC
HCT: 29.5 % — ABNORMAL LOW (ref 36.0–46.0)
Hemoglobin: 9.5 g/dL — ABNORMAL LOW (ref 12.0–15.0)
MCH: 30.1 pg (ref 26.0–34.0)
MCHC: 32.2 g/dL (ref 30.0–36.0)
MCV: 93.4 fL (ref 80.0–100.0)
Platelets: 78 10*3/uL — ABNORMAL LOW (ref 150–400)
RBC: 3.16 MIL/uL — ABNORMAL LOW (ref 3.87–5.11)
RDW: 14.8 % (ref 11.5–15.5)
WBC: 4.2 10*3/uL (ref 4.0–10.5)
nRBC: 0 % (ref 0.0–0.2)

## 2021-08-07 LAB — MISC LABCORP TEST (SEND OUT)
LabCorp test name: 5367
Labcorp test code: 9985

## 2021-08-07 LAB — MAGNESIUM: Magnesium: 2 mg/dL (ref 1.7–2.4)

## 2021-08-07 LAB — TSH: TSH: 2.225 u[IU]/mL (ref 0.350–4.500)

## 2021-08-07 LAB — CORTISOL-AM, BLOOD: Cortisol - AM: 15 ug/dL (ref 6.7–22.6)

## 2021-08-07 MED ORDER — BOOST / RESOURCE BREEZE PO LIQD CUSTOM: 1.0000 | Freq: Two times a day (BID) | ORAL | Status: DC

## 2021-08-07 MED ORDER — FUROSEMIDE 20 MG PO TABS
20.0000 mg | ORAL_TABLET | Freq: Every day | ORAL | Status: DC
  Administered 2021-08-07 – 2021-08-08 (×2): 20 mg via ORAL
  Filled 2021-08-07 (×2): qty 1

## 2021-08-07 MED ORDER — ADULT MULTIVITAMIN W/MINERALS CH
1.0000 | ORAL_TABLET | Freq: Every day | ORAL | Status: DC
  Administered 2021-08-07 – 2021-08-08 (×2): 1 via ORAL
  Filled 2021-08-07 (×2): qty 1

## 2021-08-07 MED ORDER — ENSURE ENLIVE PO LIQD
237.0000 mL | Freq: Two times a day (BID) | ORAL | Status: DC
  Administered 2021-08-07: 237 mL via ORAL

## 2021-08-07 NOTE — Plan of Care (Signed)
?  Problem: Education: ?Goal: Knowledge of General Education information will improve ?Description: Including pain rating scale, medication(s)/side effects and non-pharmacologic comfort measures ?Outcome: Progressing ?  ?Problem: Health Behavior/Discharge Planning: ?Goal: Ability to manage health-related needs will improve ?Outcome: Progressing ?  ?Problem: Clinical Measurements: ?Goal: Ability to maintain clinical measurements within normal limits will improve ?Outcome: Progressing ?Goal: Diagnostic test results will improve ?Outcome: Progressing ?Goal: Respiratory complications will improve ?Outcome: Progressing ?Goal: Cardiovascular complication will be avoided ?Outcome: Progressing ?  ?Problem: Activity: ?Goal: Risk for activity intolerance will decrease ?Outcome: Progressing ?  ?Problem: Nutrition: ?Goal: Adequate nutrition will be maintained ?Outcome: Progressing ?  ?Problem: Pain Managment: ?Goal: General experience of comfort will improve ?Outcome: Progressing ?  ?Problem: Safety: ?Goal: Ability to remain free from injury will improve ?Outcome: Progressing ?  ?Problem: Skin Integrity: ?Goal: Risk for impaired skin integrity will decrease ?Outcome: Progressing ?  ?Problem: Education: ?Goal: Ability to demonstrate management of disease process will improve ?Outcome: Progressing ?Goal: Ability to verbalize understanding of medication therapies will improve ?Outcome: Progressing ?Goal: Individualized Educational Video(s) ?Outcome: Progressing ?  ?Problem: Activity: ?Goal: Capacity to carry out activities will improve ?Outcome: Progressing ?  ?Problem: Cardiac: ?Goal: Ability to achieve and maintain adequate cardiopulmonary perfusion will improve ?Outcome: Progressing ?  ?

## 2021-08-07 NOTE — Progress Notes (Signed)
Initial Nutrition Assessment ? ?DOCUMENTATION CODES:  ? ?Non-severe (moderate) malnutrition in context of chronic illness ? ?INTERVENTION:  ?- will order Boost Breeze BID, each supplement provides 250 kcal and 9 grams of protein. ?- will order Ensure Plus High Protein BID, each supplement provides 350 kcal and 20 grams of protein. ?- will order 1 tablet multivitamin with minerals/day.  ? ? ?NUTRITION DIAGNOSIS:  ? ?Moderate Malnutrition related to chronic illness, cancer and cancer related treatments as evidenced by moderate fat depletion, moderate muscle depletion. ? ?GOAL:  ? ?Patient will meet greater than or equal to 90% of their needs ? ?MONITOR:  ? ?PO intake, Supplement acceptance, Labs, Weight trends ? ?REASON FOR ASSESSMENT:  ? ?Malnutrition Screening Tool, Consult ?Assessment of nutrition requirement/status ? ?ASSESSMENT:  ? ?76 y.o. female with medical history of non-ischemic cardiomyopathy, stage 4 NSCLC of LLL, anxiety, hypothyroidism, pulmonary embolism, dyslipidemia, HTN, GERD, OSA, TIA, CVA, stage 3 CKD, and vertigo. She presented to the ED from [facility] due to dyspnea and a 1 week hx of fatigue and shortness of breath. Outpatient she had shoulder pain and x-ray indicated concern for shoulder lesion. She was admitted for acute pulmonary embolism. ? ?Patient sitting up in the chair. Patient shares that she has to be mindful of salt intake. She lives at [facility] and states they provide excellent care and that overall the food is good. She goes to the dining hall for all meals; dining hall is ~500 feet from her room. She previously was able to make it to the dining hall using a rolling walker but now can only make it ~1/2 way before feeling too weak and needing to sit.  ? ?She does have difficulty chewing things like pork chops (dry and tough). She enjoys things such as mashed potatoes, chicken, and other soft, easier-to-chew foods.  ? ?She has tried Ensure in the past but did not like the taste  but is agreeable to trying again. ? ?After cancer treatment she was experiencing taste alterations (bland, lack of taste) for ~6 months but this has now greatly improved. ? ?She was able to eat 25-50% of breakfast this AM which consisted of orange juice, coffee, a muffin, eggs, and sausage.  ? ?Weight today is 127 lb and weight on 07/05/21 was 132 lb. This indicates 5 lb weight loss (3.8% body weight) in 1 month; not significant for time frame.  ? ?Palliative Care saw patient yesterday. ? ? ?Labs reviewed; Na: 134 mmol/l, BUN: 32 mg/dl. ?Medications reviewed; 20 mg oral lasix/day, 75 mcg oral synthroid/day, 40 mg oral protonix/day, 40 mEq Klor-Con/day. ?  ? ?NUTRITION - FOCUSED PHYSICAL EXAM: ? ?Flowsheet Row Most Recent Value  ?Orbital Region Moderate depletion  ?Upper Arm Region Moderate depletion  ?Thoracic and Lumbar Region Unable to assess  ?Buccal Region Moderate depletion  ?Temple Region Severe depletion  ?Clavicle Bone Region Moderate depletion  ?Clavicle and Acromion Bone Region Moderate depletion  ?Scapular Bone Region Moderate depletion  ?Dorsal Hand Moderate depletion  ?Patellar Region Mild depletion  ?Anterior Thigh Region Mild depletion  ?Posterior Calf Region Mild depletion  ?Edema (RD Assessment) Mild  [BLE]  ?Hair Reviewed  ?Eyes Reviewed  ?Mouth Reviewed  ?Skin Reviewed  ?Nails Reviewed  ? ?  ? ? ?Diet Order:   ?Diet Order   ? ?       ?  Diet Heart Room service appropriate? Yes; Fluid consistency: Thin  Diet effective now       ?  ? ?  ?  ? ?  ? ? ?  EDUCATION NEEDS:  ? ?Education needs have been addressed ? ?Skin:  Skin Assessment: Reviewed RN Assessment ? ?Last BM:  4/3 (type 6 x1, medium amount) ? ?Height:  ? ?Ht Readings from Last 1 Encounters:  ?08/03/21 5\' 5"  (1.651 m)  ? ? ?Weight:  ? ?Wt Readings from Last 1 Encounters:  ?08/07/21 57.5 kg  ? ? ?BMI:  Body mass index is 21.09 kg/m?. ? ?Estimated Nutritional Needs:  ?Kcal:  1725-1950 kcal ?Protein:  85-100 grams ?Fluid:  >/= 1.8  L/day ? ? ? ? ?Jarome Matin, MS, RD, LDN ?Registered Dietitian II ?Inpatient Clinical Nutrition ?RD pager # and on-call/weekend pager # available in [place]  ? ?

## 2021-08-07 NOTE — Progress Notes (Signed)
Occupational Therapy Treatment ?Patient Details ?Name: Stefanie Camacho ?MRN: [id number] ?DOB: 07-26-1945 ?Today's Date: 08/07/2021 ? ? ?History of present illness 76 yo female presents to [facility] on 3/30 with ShOB x2 days. CTA chest shows acute subsegmental pulmonary embolus within RLL without heart strain as well as extensive interstitial and airspace infiltrate suggestive of moderate pulmonary edema and bilat pleural effusions L>R; associated subtotal collapse of LLL; progressive pulmonary metastatic disease. Thoracentesis on 4/1 yielding 1.2 L of clear yellow fluid. 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 ?  ?OT comments ? Patient was noted to have continued orthostatics with slight improvement from last session. See below for blood pressures.Patient was able to transfer to 3 in 1 commode with min A with RW with education for proper hand and foot placement. Patient was noted to maintain O2 on RA during session. Patient would continue to benefit from skilled OT services at this time while admitted and after d/c to address noted deficits in order to improve overall safety and independence in ADLs.  ?Blood pressures;  ?Supine: 98/68mmhg HR 78 bpm ?Sitting: 95/65 mmhg HR 83 bpm ?Standing: 87/54 mmhg HR 98bpm ?Standing 2 mins 87/57 mmhg HR 102 with patient reporting feeling "off kilter" ?  ? ?Recommendations for follow up therapy are one component of a multi-disciplinary discharge planning process, led by the attending physician.  Recommendations may be updated based on patient status, additional functional criteria and insurance authorization. ?   ?Follow Up Recommendations ? Skilled nursing-short term rehab (<3 hours/day)  ?  ?Assistance Recommended at Discharge Frequent or constant Supervision/Assistance  ?Patient can return home with the following ? A lot of help  with bathing/dressing/bathroom;Assistance with cooking/housework;Direct supervision/assist for medications management;Assist for transportation;Help with stairs or ramp for entrance;Direct supervision/assist for financial management;A lot of help with walking and/or transfers ?  ?Equipment Recommendations ? BSC/3in1  ?  ?Recommendations for Other Services   ? ?  ?Precautions / Restrictions Precautions ?Precautions: Fall ?Precaution Comments: RUE tenderness over the past couple of weekends, especially in axillary region. monitor O2, orthostatic ?Restrictions ?Weight Bearing Restrictions: No  ? ? ?  ? ?Mobility Bed Mobility ?  ?  ?  ?  ?  ?  ?  ?  ?  ? ?Transfers ?  ?  ?  ?  ?  ?  ?  ?  ?  ?  ?  ?  ?Balance   ?  ?  ?  ?  ?  ?  ?  ?  ?  ?  ?  ?  ?  ?  ?  ?  ?  ?  ?   ? ?ADL either performed or assessed with clinical judgement  ? ?ADL Overall ADL's : Needs assistance/impaired ?  ?  ?  ?  ?  ?  ?  ?  ?  ?  ?Lower Body Dressing: Moderate assistance;Sit to/from stand;Sitting/lateral leans ?Lower Body Dressing Details (indicate cue type and reason): patient was able to don/doff socks seated in EOB with quick to fatigue. ?Toilet Transfer: Minimal assistance;BSC/3in1;Rolling walker (2 wheels) ?Toilet Transfer Details (indicate cue type and reason): with cues for proper hand positioning and physical assist for landing. ?Toileting- Clothing Manipulation and Hygiene: Maximal assistance;Sit to/from stand ?Toileting - Clothing Manipulation Details (indicate cue type and reason): with hand on RW with needed holding to maintain balance. ?  ?  ?  ?  ?  ? ?  Extremity/Trunk Assessment   ?  ?  ?  ?  ?  ? ?Vision   ?  ?  ?Perception   ?  ?Praxis   ?  ? ?Cognition Arousal/Alertness: Awake/alert ?Behavior During Therapy: [facility] for tasks assessed/performed ?Overall Cognitive Status: Within Functional Limits for tasks assessed ?  ?  ?  ?  ?  ?  ?  ?  ?  ?  ?  ?  ?  ?  ?  ?  ?General Comments: son present during session as well. ?  ?  ?    ?Exercises Other Exercises ?Other Exercises: patient attempted some marching in place to trial BP increase. BP did not change with increased activity and noted to fatigue patient. ? ?  ?Shoulder Instructions   ? ? ?  ?General Comments    ? ? ?Pertinent Vitals/ Pain       Pain Assessment ?Pain Assessment: Faces ?Faces Pain Scale: Hurts little more ?Pain Location: R shoulder ?Pain Descriptors / Indicators: Discomfort ?Pain Intervention(s): Limited activity within patient's tolerance, Monitored during session, Repositioned ? ?Home Living   ?  ?  ?  ?  ?  ?  ?  ?  ?  ?  ?  ?  ?  ?  ?  ?  ?  ?  ? ?  ?Prior Functioning/Environment    ?  ?  ?  ?   ? ?Frequency ? Min 2X/week  ? ? ? ? ?  ?Progress Toward Goals ? ?OT Goals(current goals can now be found in the care plan section) ? Progress towards OT goals: Progressing toward goals ? ?   ?Plan Discharge plan remains appropriate   ? ?Co-evaluation ? ? ?   ?  ?  ?  ?  ? ?  ?AM-PAC OT "6 Clicks" Daily Activity     ?Outcome Measure ? ? Help from another person eating meals?: A Little ?Help from another person taking care of personal grooming?: A Little ?Help from another person toileting, which includes using toliet, bedpan, or urinal?: A Lot ?Help from another person bathing (including washing, rinsing, drying)?: A Lot ?Help from another person to put on and taking off regular upper body clothing?: A Lot ?Help from another person to put on and taking off regular lower body clothing?: A Lot ?6 Click Score: 14 ? ?  ?End of Session Equipment Utilized During Treatment: Rolling walker (2 wheels) ? ?OT Visit Diagnosis: Unsteadiness on feet (R26.81);Other abnormalities of gait and mobility (R26.89) ?  ?Activity Tolerance Patient tolerated treatment well ?  ?Patient Left in bed;with call bell/phone within reach;with nursing/sitter in room;with family/visitor present ?  ?Nurse Communication Other (comment) (ok to participate and updated on status after session) ?  ? ?   ? ?Time:  0258-5277 ?OT Time Calculation (min): 29 min ? ?Charges: OT General Charges ?$OT Visit: 1 Visit ?OT Treatments ?$Self Care/Home Management : 23-37 mins ? ?Kaysen Deal OTR/L, MS ?Acute Rehabilitation Department ?Office# [phone] ?Pager# [phone] ? ? ?Feliz Beam Yovani Cogburn ?08/07/2021, 3:15 PM ?

## 2021-08-07 NOTE — Assessment & Plan Note (Signed)
Nutrition Status: ?Nutrition Problem: Moderate Malnutrition ?Etiology: chronic illness, cancer and cancer related treatments ?Signs/Symptoms: moderate fat depletion, moderate muscle depletion ?Interventions: Ensure Enlive (each supplement provides 350kcal and 20 grams of protein), Boost Breeze, MVI ?

## 2021-08-07 NOTE — Plan of Care (Signed)
Stefanie Camacho sat up in the chair for breakfast and for several hours after.  She was up ambulating to the [facility] several times during the shift. She is using her IS with encouragement. PO intake has been encouraged. She has denied any pain this shift. She did have some dizziness standing with her orthostatic VS this AM.  ?Problem: Education: ?Goal: Knowledge of General Education information will improve ?Description: Including pain rating scale, medication(s)/side effects and non-pharmacologic comfort measures ?Outcome: Progressing ?  ?Problem: Health Behavior/Discharge Planning: ?Goal: Ability to manage health-related needs will improve ?Outcome: Progressing ?  ?Problem: Clinical Measurements: ?Goal: Ability to maintain clinical measurements within normal limits will improve ?Outcome: Progressing ?Goal: Diagnostic test results will improve ?Outcome: Progressing ?Goal: Respiratory complications will improve ?Outcome: Progressing ?Note: Weaned to RA.  Enc IS use ?Goal: Cardiovascular complication will be avoided ?Outcome: Progressing ?  ?Problem: Activity: ?Goal: Risk for activity intolerance will decrease ?Outcome: Progressing ?  ?Problem: Nutrition: ?Goal: Adequate nutrition will be maintained ?Outcome: Progressing ?Note: Enc PO intake ?  ?Problem: Pain Managment: ?Goal: General experience of comfort will improve ?Outcome: Progressing ?  ?Problem: Safety: ?Goal: Ability to remain free from injury will improve ?Outcome: Progressing ?  ?Problem: Skin Integrity: ?Goal: Risk for impaired skin integrity will decrease ?Outcome: Progressing ?  ?Problem: Education: ?Goal: Ability to demonstrate management of disease process will improve ?Outcome: Progressing ?Goal: Ability to verbalize understanding of medication therapies will improve ?Outcome: Progressing ?Goal: Individualized Educational Video(s) ?Outcome: Progressing ?  ?Problem: Activity: ?Goal: Capacity to carry out activities will improve ?Outcome: Progressing ?  ?Problem:  Cardiac: ?Goal: Ability to achieve and maintain adequate cardiopulmonary perfusion will improve ?Outcome: Progressing ?  ?

## 2021-08-07 NOTE — Progress Notes (Signed)
Family meeting with patient three sons (1 at bedtime and the other 2 over the phone) and daughter (over the phone) from patient's room.  We have discussed CT finding concerning for progressive pulmonary metastatic disease, worsening heart failure with ejection fraction dropping from 60% to 30% quickly, pleural effusion likely from underlying malignancy and CHF, pulmonary embolism on Eliquis requiring subcu Lovenox, orthostatic hypotension limiting options for treatment of failure progressive CHF, and physical deconditioning.  She is a still at significant risk for recurrence of pleural effusion given underlying malignancy and worsening CHF that may require repeat thoracocentesis in the future. She is improving from respiratory failure standpoint.  However, her long-term prognosis is not great. I believe she is a candidate for hospice with prognosis less than 6 months. Oncology can give [place] more information on this and treatment option for her advanced lung cancer. Patient and family voiced understanding.  She is DNR/DNI which is appropriate.  Patient and family interested in skilled nursing facility with palliative follow-up.  They prefer SNF in [place] close to where they leave.  Patient and family appreciated the information and time.  I have notified TOC. ? ?Additional 35 minutes for family meeting ?

## 2021-08-07 NOTE — TOC Progression Note (Signed)
Transition of Care (TOC) - Progression Note  ? ? ?Patient Details  ?Name: Stefanie Camacho ?MRN: [id number] ?Date of Birth: [birth year] ? ?Transition of Care (TOC) CM/SW Contact  ?Purcell Mouton, RN ?Phone Number: ?08/07/2021, 4:56 PM ? ?Clinical Narrative:    ?Pt faxed to [facility]. ? ? ?Expected Discharge Plan: Assisted Living ?Barriers to Discharge: No Barriers Identified ? ?Expected Discharge Plan and Services ?Expected Discharge Plan: Assisted Living ?  ?Discharge Planning Services: CM Consult ?  ?Living arrangements for the past 2 months: [place] ?                ?  ?  ?  ?  ?  ?  ?  ?  ?  ?  ? ? ?Social Determinants of Health (SDOH) Interventions ?  ? ?Readmission Risk Interventions ?   ? View : No data to display.  ?  ?  ?  ? ? ?

## 2021-08-07 NOTE — Progress Notes (Signed)
Subjective: ?The patient is seen and examined.  Her son Randall Hiss was at the bedside.  She is feeling fine today with no concerning complaints except for the persistent shortness of breath.  She denied having any chest pain or hemoptysis.  She has no nausea, vomiting, diarrhea or constipation.  She has no headache or visual changes. ? ?Objective: ?Vital signs in last 24 hours: ?Temp:  [97.9 ?F (36.6 ?C)-98.2 ?F (36.8 ?C)] 98.1 ?F (36.7 ?C) (04/03 1328) ?Pulse Rate:  [70-83] 83 (04/03 1328) ?Resp:  [16-20] 20 (04/03 1328) ?BP: (97-105)/(54-61) 101/54 (04/03 1328) ?SpO2:  [94 %-100 %] 95 % (04/03 1328) ?Weight:  [126 lb 12.2 oz (57.5 kg)] 126 lb 12.2 oz (57.5 kg) (04/03 0436) ? ?Intake/Output from previous day: ?04/02 0701 - 04/03 0700 ?In: 600 [P.O.:600] ?Out: 300 [Urine:300] ?Intake/Output this shift: ?Total I/O ?In: 480 [P.O.:480] ?Out: 200 [Urine:200] ? ?General appearance: alert, cooperative, fatigued, and mild distress ?Resp: diminished breath sounds bilaterally and dullness to percussion bilaterally ?Cardio: regular rate and rhythm, S1, S2 normal, no murmur, click, rub or gallop ?GI: soft, non-tender; bowel sounds normal; no masses,  no organomegaly ?Extremities: edema 1+ ? ?Lab Results:  ?Recent Labs  ?  08/06/21 ?6222 08/07/21 ?0324  ?WBC 4.7 4.2  ?HGB 9.8* 9.5*  ?HCT 29.8* 29.5*  ?PLT 73* 78*  ? ?BMET ?Recent Labs  ?  08/06/21 ?9798 08/07/21 ?0324  ?NA 135 134*  ?K 4.0 4.1  ?CL 100 102  ?CO2 26 24  ?GLUCOSE 95 93  ?BUN 30* 32*  ?CREATININE 0.96 0.84  ?CALCIUM 9.1 9.2  ? ? ?Studies/Results: ?No results found. ? ?Medications: I have reviewed the patient's current medications. ? ?CODE STATUS: No CODE BLUE ? ?Assessment/Plan: ?This is a very pleasant 76 years old white female diagnosed with a stage IV non-small cell lung cancer, adenocarcinoma with leptomeningeal disease and September 2021.  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  She started treatment with Tagrisso 160 mg p.o. daily since February 01, 2020 and has been on this treatment since that time.  She also received whole brain irradiation and radiotherapy to the osseous metastasis in September 2021.  The patient has been tolerating this treatment well with no concerning adverse effects.  She has been also on treatment with Eliquis 5 mg p.o. twice daily for the history of pulmonary embolism. The patient is currently on treatment with Lovenox. ?She was found recently to have evidence for disease progression with multiple new and enlarging pulmonary nodules in addition to pleural effusion as well as development of acute pulmonary embolism. ?I had a lengthy discussion with the patient and her son Randall Hiss today about her current condition and treatment options. ?I explained to the patient that her option would be either palliative care and hospice referral versus consideration of repeat the molecular studies by [employer] to see if she developed any actionable resistant mutation for consideration of treatment with targeted therapy.  If she has no new actionable mutation, her next treatment option would be systemic chemotherapy versus palliative care and hospice.  The patient has no interest in systemic chemotherapy but may consider the blood test for guardant in the future. ?She is expected to be discharged to skilled nursing facility for rehabilitation.  If she improves during this.  She will come for a follow-up appointment with me at the cancer center for the [place] test and discussion of  further treatment options.  But if she declines during her stay at the skilled nursing facility, she would consider hospice care at that point. ?Thank you for taking good care of Mr. Nifong, we will continue to follow the patient with you and assist in her management as needed. ? ? ? LOS: 2 days  ? ? ?Eilleen Kempf ?08/07/2021 ? ?  ?

## 2021-08-07 NOTE — Progress Notes (Signed)
?PROGRESS NOTE ? ?Stefanie Camacho QJJ:[id number] DOB: May 18, 1945  ? ?PCP: Stefanie Ada, MD ? ?Patient is from: ILF. ? ?DOA: 08/03/2021 LOS: 2 ? ?Chief complaints ?Chief Complaint  ?Patient presents with  ? Shortness of Breath  ?  ? 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 Earlie Camacho, pulmonary embolism on Eliquis, combined CHF/NICM, CVA, CKD-3A, OSA not on CPAP, hypothyroidism, anxiety, depression and debility presenting from ILF with shortness of breath and fatigue and found to have acute subsegmental PE in RLL.  CTA chest also concerning for progressive pulmonary and and widespread osseous mets and moderate pulmonary edema with associated bilateral pleural effusion, left> right with associated sepsis total collapse of the LLL.  Patient was started on IV heparin and IV Lasix.  TTE ordered.  Oncology consulted.  Concern about Eliquis failure.  Oncology recommended Lovenox on discharge. ? ?TTE with LVEF of 30 to 35%, G2 DD and GH (worse than prior).  Cardiology consulted and gave recommendations and signed off.  Patient has significant orthostatic hypotension.  Metoprolol reduced.  Other cardiac meds discontinued.  ? ?Patient had thoracocentesis with removal of 1.2 L exudative fluid from left chest on 4/1. ? ?Overall, poor long-term prognosis with progressive CHF, lung cancer, pleural effusion, pulmonary embolism, thrombocytopenia, orthostatic hypotension and other comorbidities.  Palliative medicine consulted.  ? ?Subjective: ?Seen and examined earlier this morning.  No major events overnight or this morning.  She says she had a good sleep last night.  Denies pain or shortness of breath.  Aware of family meeting at 1 PM today with a children. ? ?Objective: ?Vitals:  ? 08/07/21 0406 08/07/21 0436 08/07/21 1007 08/07/21 1201  ?BP: (!) 97/55  105/61    ?Pulse: 70  81   ?Resp: 16     ?Temp: 97.9 ?F (36.6 ?C)  98 ?F (36.7 ?C)   ?TempSrc: Oral  Oral   ?SpO2: 94%  100% 96%  ?Weight:  57.5 kg    ?Height:      ? ? ?Examination: ? ?GENERAL: No apparent distress.  Nontoxic. ?HEENT: MMM.  Vision and hearing grossly intact.  ?NECK: Supple.  No apparent JVD.  ?RESP:  No IWOB.  Improved aeration in left lung. ?CVS:  RRR. Heart sounds normal.  ?ABD/GI/GU: BS+. Abd soft, NTND.  ?MSK/EXT:  Moves extremities. No apparent deformity. No edema.  TED hose in place. ?SKIN: no apparent skin lesion or wound ?NEURO: Awake and alert. Oriented appropriately.  No apparent focal neuro deficit. ?PSYCH: Calm. Normal affect.  ? ? ?Procedures:  ?None ? ?Microbiology summarized: ?COVID-19 and influenza PCR nonreactive. ? ?Assessment and Plan: ?* Acute pulmonary embolism ([place]) ?Acute subsegmental PE in RLL noted on CTA chest.  Patient reports good compliance with Eliquis which is concerning for Eliquis failure.  Oncology consulted and recommended subcu Lovenox.  No radiologic evidence of right heart strain.  LE Doppler negative for DVT. ?-Transitioned to subcu Lovenox per oncology ? ?Acute respiratory failure with hypoxia ([place]) ?Multifactorial including underlying CHF, pleural effusion, lung cancer, possible drug-induced pneumonitis from Tagrisso and possibly PE although small.  Desaturated to 81% on RA at rest.  Required up to 5 L.  Now down to 1 L. ?-Manage CHF, pleural effusion and PE as below. ?-Holding Tagrisso per oncology while in the hospital ?-Wean oxygen as able ?-Incentive spirometry, OOB, PT/OT ? ?Orthostatic hypotension in  patient with history of hypertension ?Likely from progressive CHF and cardiac meds.  TTE as above.  Still orthostatic with symptoms. ?-Adjusted cardiac meds as above. ?-Continue TED hose, Daily orthostatic vitals, head of bed elevation and OB ?-May consider midodrine if no improvement with the above measures although this could increase afterload. ? ?Bilateral  pleural effusion ?CTA chest showed bilateral pleural effusion, left> right.  Had thoracocentesis with removal of 1.2 L exudative fluid likely from extensive lung cancer +/- CHF.  Improved aeration and oxygenation. ?-Continue encouraging incentive spirometry, OOB. ? ?Goals of care, counseling/discussion ?See discussion with patient and patient's son, Stefanie Camacho on 7/2.  Plan for family meeting with patient's children at bedside at 1 PM today ? 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  BNP > 4500 but no prior value for comparison.  TTE with LVEF of 30 to 35% (worse), G2-DD (worse) and GH.  Started on IV Lasix and became hypotensive with orthostatic hypotension. She denies orthopnea, PND or edema.  Appears euvolemic on exam.  However, respiratory failure with oxygen requirement. ?-Cardiology gave recommendation and signed off. ?-Restart p.o. Lasix at 20 mg daily ?-Continue holding losartan and Aldactone ?-Reduced Toprol-XL given orthostatic hypotension. ?-Monitor I&O, respiratory status, renal functions and electrolytes ?-Sodium and fluid restriction ? ?Malnutrition of moderate degree ?Nutrition Status: ?Nutrition Problem: Moderate Malnutrition ?Etiology: chronic illness, cancer and cancer related treatments ?Signs/Symptoms: moderate fat depletion, moderate muscle depletion ?Interventions: Ensure Enlive (each supplement provides 350kcal and 20 grams of protein), Boost Breeze, MVI ? ?Thrombocytopenia ([employer]) ?Recent Labs  ?Lab 08/03/21 ?2319 08/04/21 ?0353 08/05/21 ?0615 08/06/21 ?0973 08/07/21 ?0324  ?PLT 89* 89* 72* 73* 78*  ?Slowly improving.  No signs of bleeding.  Continue monitoring ? ? ?Adenocarcinoma of left lung, stage 4 ([place]) ?S/p whole brain radiation andradiation.  Currently on Tagrisso.  Followed by oncology.  CTA with progressive pulmonary metastasis and widespread osseous metastasis ?-Oncology: Hold Tagrisso for few days during hospitalization for  possible drug induced pneumonitis ?-Outpatient follow-up for discussion about further treatment options ? ?Constipation ?Continue bowel regimen. ? ?Anxiety ?Continue Prozac ? ?Hypothyroidism, unspecified ?Continue home Synthroid ? ? ? ?DVT prophylaxis:  ?Place TED hose Start: 08/06/21 0842 ?Patient is on full anticoagulation for PE ? ?Code Status: DNR/DNI ?Family Communication: Plan for family meeting at 25. ?Level of care: Progressive ?Status is: Inpatient ?The patient will remain inpatient because: Acute respiratory failure requiring significant oxygen, orthostatic hypotension... ? ? ?Final disposition: TBD. ? ?Consultants:  ?Oncology ?Cardiology-signed off ?Palliative medicine ? ?Sch Meds:  ?Scheduled Meds: ? atorvastatin  10 mg Oral Daily  ? enoxaparin (LOVENOX) injection  1.5 mg/kg Subcutaneous Daily  ? feeding supplement  1 Container Oral BID BM  ? feeding supplement  237 mL Oral BID BM  ? FLUoxetine  20 mg Oral Daily  ? furosemide  20 mg Oral Daily  ? levETIRAcetam  250 mg Oral BID  ? levothyroxine  75 mcg Oral Q0600  ? mouth rinse  15 mL Mouth Rinse BID  ? metoprolol succinate  25 mg Oral Daily  ? multivitamin with minerals  1 tablet Oral Daily  ? pantoprazole  40 mg Oral QPM  ? potassium chloride  40 mEq Oral Daily  ? ?Continuous Infusions: ?PRN Meds:.acetaminophen **OR** acetaminophen, oxyCODONE, phenol, polyethylene glycol, polyvinyl alcohol, senna-docusate ? ?Antimicrobials: ?Anti-infectives (From admission, onward)  ? ? None  ? ?  ? ? ? ?I have personally reviewed the following labs and images: ?CBC: ?Recent Labs  ?  Lab 08/03/21 ?2319 08/04/21 ?0353 08/05/21 ?0615 08/06/21 ?6286 08/07/21 ?0324  ?WBC 5.4 6.5 5.4 4.7 4.2  ?NEUTROABS 4.1  --   --   --   --   ?HGB 10.7* 11.7* 10.3* 9.8* 9.5*  ?HCT 32.2* 34.9* 31.9* 29.8* 29.5*  ?MCV 90.4 92.3 95.2 92.5 93.4  ?PLT 89* 89* 72* 73* 78*  ? ?BMP &GFR ?Recent Labs  ?Lab 08/03/21 ?2319 08/04/21 ?3817 08/05/21 ?0615 08/06/21 ?7116 08/07/21 ?0324  ?NA 137 136 135 135  134*  ?K 3.5 3.4* 3.8 4.0 4.1  ?CL 105 103 102 100 102  ?CO2 20* 22 25 26 24   ?GLUCOSE 103* 128* 100* 95 93  ?BUN 24* 23 23 30* 32*  ?CREATININE 0.85 0.94 0.94 0.96 0.84  ?CALCIUM 9.1 8.6* 8.7* 9.1 9.2  ?MG

## 2021-08-07 NOTE — Progress Notes (Signed)
PT Cancellation Note ? ?Patient Details ?Name: Stefanie Camacho ?MRN: [id number] ?DOB: [birth year] ? ? ?Cancelled Treatment:     A family meeting is scheduled today. Will follow for [place] decision. Tresa Endo PT ?Acute Rehabilitation Services ?Pager [phone] ?Office [phone] ? ? ? ?Charmane Protzman, Shella Maxim ?08/07/2021, 12:38 PM ?

## 2021-08-07 NOTE — [place] FL2 (Signed)
?[place] MEDICAID FL2 LEVEL OF CARE SCREENING TOOL  ?  ? ?IDENTIFICATION  ?Patient Name: ?Stefanie Camacho Birthdate: 1945/08/04 Sex: female Admission Date (Current Location): ?08/03/2021  ?[place] and [place] Number: ? [place] ?  Facility and Address:  ?[facility],  [address], [place] ?     Provider Number: ?[id number]  ?Attending Physician Name and Address:  ?Mercy Riding, MD ? Relative Name and Phone Number:  ?Keymani, Glynn   [phone] ?   ?Current Level of Care: ?Hospital Recommended Level of Care: ?[place] Prior Approval Number: ?  ? ?Date Approved/Denied: ?  PASRR Number: ?[id number] A ? ?Discharge Plan: ?SNF ?  ? ?Current Diagnoses: ?Patient Active Problem List  ? Diagnosis Date Noted  ? Malnutrition of moderate degree 08/07/2021  ? Orthostatic hypotension in patient with history of hypertension 08/06/2021  ? Constipation 08/06/2021  ? Acute congestive heart failure ([place])   ? Acute respiratory failure with hypoxia ([place]) 08/05/2021  ? Bilateral pleural effusion 08/05/2021  ? Acute pulmonary embolism ([place]) 08/04/2021  ? Thrombocytopenia ([place]) 08/04/2021  ? Nonsustained ventricular tachycardia ([place]) 09/13/2020  ? Hardening of the aorta (main artery of the heart) ([place]) 09/08/2020  ? Recurrent falls 07/12/2020  ? Sleep apnea   ? NICM (nonischemic cardiomyopathy) ([place])   ? Hypothyroidism   ? GERD (gastroesophageal reflux disease)   ? Blood clots in brain   ? Chronic kidney disease due to hypertension 05/16/2020  ? Chronic kidney disease, stage 3a ([place]) 05/16/2020  ? Chronic respiratory failure ([place]) 05/16/2020  ? Hyperlipidemia 05/16/2020  ? Insomnia 05/16/2020  ? Long term (current) use of anticoagulants 05/16/2020  ? Lung mass 05/16/2020  ? 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 Major depression, single episode 05/16/2020  ? 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 Rosacea 05/16/2020  ? Stress incontinence  (female) (female) 05/16/2020  ? Daytime somnolence 05/16/2020  ? Single subsegmental pulmonary embolism without acute cor pulmonale ([place]) 03/25/2020  ? Cough 03/18/2020  ? Generalized weakness 03/18/2020  ? 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 Leptomeningeal metastases 01/29/2020  ? 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 Adenocarcinoma of left lung, stage 4 ([place]) 01/18/2020  ? Encounter for antineoplastic chemotherapy 01/18/2020  ? Goals of care, counseling/discussion 01/18/2020  ? Malignant neoplasm of lower lobe, left bronchus or lung ([place]) 01/14/2020  ? Acute CVA (cerebrovascular accident) ([place]) 01/09/2020  ? Intracranial bleeding ([place]) 01/09/2020  ? TIA (transient ischemic attack) 01/08/2020  ? Lumbar foraminal stenosis 12/30/2019  ? Dizziness 09/25/2019  ? Vertigo 09/25/2019  ? Obstructive sleep apnea syndrome   ? Gastro-esophageal reflux disease without esophagitis   ? Anxiety   ? Hypothyroidism, unspecified   ? 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 Essential hypertension 06/04/2018  ? Left bundle branch block 06/04/2018  ? Dyslipidemia 06/04/2018  ? ? ?Orientation RESPIRATION BLADDER Height & Weight   ?  ?Self, Time, Situation, Place ? Normal Incontinent Weight: 57.5 kg ?Height:  5\' 5"  (165.1 cm)  ?BEHAVIORAL SYMPTOMS/MOOD NEUROLOGICAL BOWEL NUTRITION STATUS  ?    Continent Diet ([place])  ?AMBULATORY STATUS COMMUNICATION OF NEEDS Skin   ?Extensive Assist Verbally Normal ?  ?  ?  ?    ?     ?     ? ? ?Personal Care Assistance Level of Assistance  ?Bathing, Feeding, Dressing Bathing Assistance: Limited assistance ?Feeding assistance: Independent ?Dressing Assistance: Limited assistance ?   ? ?  Functional Limitations Info  ?Sight, Hearing, Speech Sight Info: Adequate ?Hearing Info: Adequate ?Speech Info: Adequate  ? ? ?SPECIAL CARE FACTORS FREQUENCY  ?PT (By licensed PT), OT (By licensed OT)   ?  ?PT Frequency: 5x week ?OT Frequency: 5x week ?  ?   ?  ?   ? ? ?Contractures Contractures Info: Not present  ? ? ?Additional Factors Info  ?Code Status, Allergies Code Status Info: DNR ?Allergies Info: Sertraline Hcl ?  ?  ?  ?   ? ?Current Medications (08/07/2021):  This is the current hospital active medication list ?Current Facility-Administered Medications  ?Medication Dose Route Frequency Provider Last Rate Last Admin  ? acetaminophen (TYLENOL) tablet 650 mg  650 mg Oral Q6H PRN Marylyn Ishihara, Tyrone A, DO   650 mg at 08/06/21 2100  ? Or  ? acetaminophen (TYLENOL) suppository 650 mg  650 mg Rectal Q6H PRN Marylyn Ishihara, Tyrone A, DO      ? atorvastatin (LIPITOR) tablet 10 mg  10 mg Oral Daily Kyle, Tyrone A, DO   10 mg at 08/07/21 1010  ? enoxaparin (LOVENOX) injection 90 mg  1.5 mg/kg Subcutaneous Daily Wendee Beavers T, MD   90 mg at 08/07/21 1011  ? feeding supplement (BOOST / RESOURCE BREEZE) liquid 1 Container  1 Container Oral BID BM Gonfa, Taye T, MD      ? feeding supplement (ENSURE ENLIVE / ENSURE PLUS) liquid 237 mL  237 mL Oral BID BM Wendee Beavers T, MD   237 mL at 08/07/21 1352  ? FLUoxetine (PROZAC) capsule 20 mg  20 mg Oral Daily Kyle, Tyrone A, DO   20 mg at 08/07/21 1009  ? furosemide (LASIX) tablet 20 mg  20 mg Oral Daily Wendee Beavers T, MD   20 mg at 08/07/21 1009  ? levETIRAcetam (KEPPRA) tablet 250 mg  250 mg Oral BID Marylyn Ishihara, Tyrone A, DO   250 mg at 08/07/21 1009  ? levothyroxine (SYNTHROID) tablet 75 mcg  75 mcg Oral Q0600 Cherylann Ratel A, DO   75 mcg at 08/07/21 0515  ? MEDLINE mouth rinse  15 mL Mouth Rinse BID Wendee Beavers T, MD   15 mL at 08/07/21 1010  ? metoprolol succinate (TOPROL-XL) 24 hr tablet 25 mg  25 mg Oral Daily Wendee Beavers T, MD   25 mg at 08/07/21 1010  ? multivitamin with minerals tablet 1 tablet  1 tablet Oral Daily Mercy Riding, MD   1 tablet at 08/07/21 1352  ? oxyCODONE (Oxy IR/ROXICODONE) immediate release tablet 5 mg  5 mg Oral Q4H PRN Marylyn Ishihara, Tyrone A, DO      ? pantoprazole (PROTONIX) EC tablet 40 mg  40 mg Oral QPM Kyle, Tyrone A, DO   40 mg  at 08/06/21 1730  ? phenol (CHLORASEPTIC) mouth spray 1 spray  1 spray Mouth/Throat PRN Gonfa, Taye T, MD      ? polyethylene glycol (MIRALAX / GLYCOLAX) packet 17 g  17 g Oral BID PRN Mercy Riding, MD      ? polyvinyl alcohol (LIQUIFILM TEARS) 1.4 % ophthalmic solution 1 drop  1 drop Both Eyes Q6H PRN Marylyn Ishihara, Tyrone A, DO      ? potassium chloride SA (KLOR-CON M) CR tablet 40 mEq  40 mEq Oral Daily Kyle, Tyrone A, DO   40 mEq at 08/07/21 1010  ? senna-docusate (Senokot-S) tablet 1 tablet  1 tablet Oral BID PRN Mercy Riding, MD      ? ? ? ?Discharge  Medications: ?Please see discharge summary for a list of discharge medications. ? ?Relevant Imaging Results: ? ?Relevant Lab Results: ? ? ?Additional Information ?[id number] ? ?Jonee Lamore, RN ? ? ? ? ?

## 2021-08-08 ENCOUNTER — Encounter: Payer: Self-pay | Admitting: Internal Medicine

## 2021-08-08 DIAGNOSIS — E440 Moderate protein-calorie malnutrition: Secondary | ICD-10-CM

## 2021-08-08 DIAGNOSIS — I5041 Acute combined systolic (congestive) and diastolic (congestive) heart failure: Secondary | ICD-10-CM

## 2021-08-08 DIAGNOSIS — R5381 Other malaise: Secondary | ICD-10-CM

## 2021-08-08 DIAGNOSIS — K5900 Constipation, unspecified: Secondary | ICD-10-CM

## 2021-08-08 LAB — HEPARIN ANTI-XA: Heparin LMW: 1.43 IU/mL

## 2021-08-08 LAB — CBC
HCT: 31 % — ABNORMAL LOW (ref 36.0–46.0)
Hemoglobin: 10.2 g/dL — ABNORMAL LOW (ref 12.0–15.0)
MCH: 30.4 pg (ref 26.0–34.0)
MCHC: 32.9 g/dL (ref 30.0–36.0)
MCV: 92.3 fL (ref 80.0–100.0)
Platelets: 78 10*3/uL — ABNORMAL LOW (ref 150–400)
RBC: 3.36 MIL/uL — ABNORMAL LOW (ref 3.87–5.11)
RDW: 14.6 % (ref 11.5–15.5)
WBC: 4.1 10*3/uL (ref 4.0–10.5)
nRBC: 0 % (ref 0.0–0.2)

## 2021-08-08 MED ORDER — ADULT MULTIVITAMIN W/MINERALS CH: 1.0000 | ORAL_TABLET | Freq: Every day | ORAL | Status: AC

## 2021-08-08 MED ORDER — FUROSEMIDE 20 MG PO TABS: 20.0000 mg | ORAL_TABLET | ORAL | Status: AC

## 2021-08-08 MED ORDER — MIDODRINE HCL 5 MG PO TABS: 5.0000 mg | ORAL_TABLET | Freq: Three times a day (TID) | ORAL | Status: DC

## 2021-08-08 MED ORDER — ALUM & MAG HYDROXIDE-SIMETH 200-200-20 MG/5ML PO SUSP
30.0000 mL | Freq: Three times a day (TID) | ORAL | Status: DC | PRN
  Administered 2021-08-08: 30 mL via ORAL
  Filled 2021-08-08: qty 30

## 2021-08-08 MED ORDER — FUROSEMIDE 20 MG PO TABS: 20.0000 mg | ORAL_TABLET | ORAL | Status: DC

## 2021-08-08 MED ORDER — POLYETHYLENE GLYCOL 3350 17 G PO PACK: 17.0000 g | PACK | Freq: Two times a day (BID) | ORAL | 0 refills | Status: AC | PRN

## 2021-08-08 MED ORDER — SENNOSIDES-DOCUSATE SODIUM 8.6-50 MG PO TABS: 1.0000 | ORAL_TABLET | Freq: Two times a day (BID) | ORAL | Status: AC | PRN

## 2021-08-08 MED ORDER — ENOXAPARIN SODIUM 100 MG/ML IJ SOSY: 1.5000 mg/kg | PREFILLED_SYRINGE | Freq: Every day | INTRAMUSCULAR | Status: AC

## 2021-08-08 MED ORDER — ONDANSETRON HCL 4 MG PO TABS: 4.0000 mg | ORAL_TABLET | Freq: Every day | ORAL | 1 refills | Status: AC | PRN

## 2021-08-08 MED ORDER — PANTOPRAZOLE SODIUM 40 MG PO TBEC
40.0000 mg | DELAYED_RELEASE_TABLET | Freq: Every day | ORAL | Status: DC
Start: 2021-08-08 — End: 2021-08-08
  Administered 2021-08-08: 40 mg via ORAL
  Filled 2021-08-08: qty 1

## 2021-08-08 MED ORDER — METOPROLOL SUCCINATE ER 25 MG PO TB24: 25.0000 mg | ORAL_TABLET | Freq: Every day | ORAL | Status: AC

## 2021-08-08 NOTE — NC FL2 (Addendum)
?[place] MEDICAID FL2 LEVEL OF CARE SCREENING TOOL  ?  ? ?IDENTIFICATION  ?Patient Name: ?Stefanie Camacho Birthdate: [birth year] Sex: female Admission Date (Current Location): ?08/03/2021  ?[place] Number: ? [place] ?  Facility and Address:  ?[facility],  [place], [place] ?     Provider Number: ?[id number]  ?Attending Physician Name and Address:  ?Mercy Riding, MD ? Relative Name and Phone Number:  ?Kaislyn, Gulas   [phone] ?   ?Current Level of Care: ?Hospital Recommended Level of Care: ?Assisted Living Facility Prior Approval Number: ?  ? ?Date Approved/Denied: ?  PASRR Number: ?[id number] A ? ?Discharge Plan: ?ALF ?  ? ?Current Diagnoses: ?Patient Active Problem List  ? Diagnosis Date Noted  ? Malnutrition of moderate degree 08/07/2021  ? Orthostatic hypotension in patient with history of hypertension 08/06/2021  ? Constipation 08/06/2021  ? Acute congestive heart failure ([place])   ? Acute respiratory failure with hypoxia ([place]) 08/05/2021  ? Bilateral pleural effusion 08/05/2021  ? Acute pulmonary embolism ([place]) 08/04/2021  ? Thrombocytopenia ([place]) 08/04/2021  ? Nonsustained ventricular tachycardia ([place]) 09/13/2020  ? Hardening of the aorta (main artery of the heart) ([place]) 09/08/2020  ? Recurrent falls 07/12/2020  ? Sleep apnea   ? NICM (nonischemic cardiomyopathy) ([place])   ? Hypothyroidism   ? GERD (gastroesophageal reflux disease)   ? Blood clots in brain   ? Chronic kidney disease due to hypertension 05/16/2020  ? Chronic kidney disease, stage 3a ([place]) 05/16/2020  ? Chronic respiratory failure ([place]) 05/16/2020  ? Hyperlipidemia 05/16/2020  ? Insomnia 05/16/2020  ? Long term (current) use of anticoagulants 05/16/2020  ? Lung mass 05/16/2020  ? 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[place]) 05/16/2020  ? Major depression, single episode 05/16/2020  ? Secondary malignant neoplasm of intrathoracic lymph nodes ([place]) 05/16/2020  ? Rosacea 05/16/2020  ? Stress incontinence  (female) (female) 05/16/2020  ? Daytime somnolence 05/16/2020  ? Single subsegmental pulmonary embolism without acute cor pulmonale ([place]) 03/25/2020  ? Cough 03/18/2020  ? Generalized weakness 03/18/2020  ? 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 Leptomeningeal metastases 01/29/2020  ? 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[place]) 01/28/2020  ? Adenocarcinoma of left lung, stage 4 ([place]) 01/18/2020  ? Encounter for antineoplastic chemotherapy 01/18/2020  ? Goals of care, counseling/discussion 01/18/2020  ? Malignant neoplasm of lower lobe, left bronchus or lung ([place]) 01/14/2020  ? Acute CVA (cerebrovascular accident) ([place]) 01/09/2020  ? Intracranial bleeding ([place]) 01/09/2020  ? TIA (transient ischemic attack) 01/08/2020  ? Lumbar foraminal stenosis 12/30/2019  ? Dizziness 09/25/2019  ? Vertigo 09/25/2019  ? Obstructive sleep apnea syndrome   ? Gastro-esophageal reflux disease without esophagitis   ? Anxiety   ? Hypothyroidism, unspecified   ? 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 Essential hypertension 06/04/2018  ? Left bundle branch block 06/04/2018  ? Dyslipidemia 06/04/2018  ? ? ?Orientation RESPIRATION BLADDER Height & Weight   ?  ?Self, Time, Situation, Place ? Normal Incontinent Weight: 56.9 kg ?Height:  5\' 5"  (165.1 cm)  ?BEHAVIORAL SYMPTOMS/MOOD NEUROLOGICAL BOWEL NUTRITION STATUS  ?    Continent Diet (Heart Heathy)  ?AMBULATORY STATUS COMMUNICATION OF NEEDS Skin   ?Extensive Assist Verbally Normal ?  ?  ?  ?    ?     ?     ? ? ?Personal Care Assistance Level of Assistance  ?Bathing, Feeding, Dressing Bathing Assistance: Limited assistance ?Feeding assistance: Independent ?Dressing Assistance: Limited assistance ?   ? ?  Functional Limitations Info  ?Sight, Hearing, Speech Sight Info: Adequate ?Hearing Info: Adequate ?Speech Info: Adequate  ? ? ?SPECIAL CARE FACTORS FREQUENCY  ?PT (By licensed PT), OT (By licensed OT)   ?  ?PT Frequency: 5x week ?OT Frequency: 5x week ?  ?   ?  ?   ? ? ?Contractures Contractures Info: Not present  ? ? ?Additional Factors Info  ?Code Status, Allergies Code Status Info: DNR ?Allergies Info: Sertraline Hcl ?  ?  ?  ?   ? ?Current Medications (08/08/2021):  This is the current hospital active medication list ?Current Facility-Administered Medications  ?Medication Dose Route Frequency Provider Last Rate Last Admin  ? acetaminophen (TYLENOL) tablet 650 mg  650 mg Oral Q6H PRN Marylyn Ishihara, Tyrone A, DO   650 mg at 08/08/21 3154  ? Or  ? acetaminophen (TYLENOL) suppository 650 mg  650 mg Rectal Q6H PRN Marylyn Ishihara, Tyrone A, DO      ? alum & mag hydroxide-simeth (MAALOX/MYLANTA) 200-200-20 MG/5ML suspension 30 mL  30 mL Oral Q8H PRN Wendee Beavers T, MD   30 mL at 08/08/21 0841  ? atorvastatin (LIPITOR) tablet 10 mg  10 mg Oral Daily Kyle, Tyrone A, DO   10 mg at 08/08/21 1153  ? enoxaparin (LOVENOX) injection 90 mg  1.5 mg/kg Subcutaneous Daily Wendee Beavers T, MD   90 mg at 08/08/21 1153  ? feeding supplement (BOOST / RESOURCE BREEZE) liquid 1 Container  1 Container Oral BID BM Gonfa, Taye T, MD      ? feeding supplement (ENSURE ENLIVE / ENSURE PLUS) liquid 237 mL  237 mL Oral BID BM Wendee Beavers T, MD   237 mL at 08/07/21 1352  ? FLUoxetine (PROZAC) capsule 20 mg  20 mg Oral Daily Kyle, Tyrone A, DO   20 mg at 08/08/21 1153  ? [START ON 08/10/2021] furosemide (LASIX) tablet 20 mg  20 mg Oral QODAY Wendee Beavers T, MD      ? levETIRAcetam (KEPPRA) tablet 250 mg  250 mg Oral BID Marylyn Ishihara, Tyrone A, DO   250 mg at 08/08/21 1209  ? levothyroxine (SYNTHROID) tablet 75 mcg  75 mcg Oral Q0600 Cherylann Ratel A, DO   75 mcg at 08/08/21 0610  ? MEDLINE mouth rinse  15 mL Mouth Rinse BID Gonfa, Taye T, MD   15 mL at 08/08/21 1210  ? metoprolol succinate (TOPROL-XL) 24 hr tablet 25 mg  25 mg Oral Daily Wendee Beavers T, MD   25 mg at 08/08/21 1153  ? multivitamin with minerals tablet 1 tablet  1 tablet Oral Daily Mercy Riding, MD   1 tablet at 08/08/21 1152  ? oxyCODONE (Oxy IR/ROXICODONE) immediate  release tablet 5 mg  5 mg Oral Q4H PRN Marylyn Ishihara, Tyrone A, DO      ? pantoprazole (PROTONIX) EC tablet 40 mg  40 mg Oral Daily Wendee Beavers T, MD   40 mg at 08/08/21 0841  ? phenol (CHLORASEPTIC) mouth spray 1 spray  1 spray Mouth/Throat PRN Gonfa, Taye T, MD      ? polyethylene glycol (MIRALAX / GLYCOLAX) packet 17 g  17 g Oral BID PRN Mercy Riding, MD      ? polyvinyl alcohol (LIQUIFILM TEARS) 1.4 % ophthalmic solution 1 drop  1 drop Both Eyes Q6H PRN Marylyn Ishihara, Tyrone A, DO      ? potassium chloride SA (KLOR-CON M) CR tablet 40 mEq  40 mEq Oral Daily Kyle, Tyrone A, DO   40 mEq  at 08/08/21 1153  ? senna-docusate (Senokot-S) tablet 1 tablet  1 tablet Oral BID PRN Mercy Riding, MD      ? ? ? ?Discharge Medications: ?Please see discharge summary for a list of discharge medications. ? ?Relevant Imaging Results: ? ?Relevant Lab Results: ? ? ?Additional Information ?[phone] ? ?Zanobia Griebel, RN ? ? ? ? ?

## 2021-08-08 NOTE — NC FL2 (Signed)
?[place] MEDICAID FL2 LEVEL OF CARE SCREENING TOOL  ?  ? ?IDENTIFICATION  ?Patient Name: ?Stefanie Camacho Birthdate: Dec 27, 1945 Sex: female Admission Date (Current Location): ?08/03/2021  ?[place] Number: ? [place] ?  Facility and Address:  ?[facility],  [place], [place] ?     Provider Number: ?[id number]  ?Attending Physician Name and Address:  ?Mercy Riding, MD ? Relative Name and Phone Number:  ?Lashay, Osborne   [phone] ?   ?Current Level of Care: ?Hospital Recommended Level of Care: ?Assisted Living Facility Prior Approval Number: ?  ? ?Date Approved/Denied: ?  PASRR Number: ?[id number] A ? ?Discharge Plan: ?Other (Comment) (ALF) ?  ? ?Current Diagnoses: ?Patient Active Problem List  ? Diagnosis Date Noted  ? Physical deconditioning 08/08/2021  ? Malnutrition of moderate degree 08/07/2021  ? Orthostatic hypotension in patient with history of hypertension 08/06/2021  ? Constipation 08/06/2021  ? Acute congestive heart failure ([place])   ? Acute respiratory failure with hypoxia ([place]) 08/05/2021  ? Bilateral pleural effusion 08/05/2021  ? Acute pulmonary embolism ([place]) 08/04/2021  ? Thrombocytopenia ([place]) 08/04/2021  ? Nonsustained ventricular tachycardia ([place]) 09/13/2020  ? Hardening of the aorta (main artery of the heart) ([place]) 09/08/2020  ? Recurrent falls 07/12/2020  ? Sleep apnea   ? NICM (nonischemic cardiomyopathy) ([place])   ? Hypothyroidism   ? GERD (gastroesophageal reflux disease)   ? Blood clots in brain   ? Chronic kidney disease due to hypertension 05/16/2020  ? Chronic kidney disease, stage 3a ([place]) 05/16/2020  ? Chronic respiratory failure ([place]) 05/16/2020  ? Hyperlipidemia 05/16/2020  ? Insomnia 05/16/2020  ? Long term (current) use of anticoagulants 05/16/2020  ? Lung mass 05/16/2020  ? 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[place]) 05/16/2020  ? Major depression, single episode 05/16/2020  ? Secondary malignant neoplasm of intrathoracic lymph nodes ([place])  05/16/2020  ? Rosacea 05/16/2020  ? Stress incontinence (female) (female) 05/16/2020  ? Daytime somnolence 05/16/2020  ? Single subsegmental pulmonary embolism without acute cor pulmonale ([place]) 03/25/2020  ? Cough 03/18/2020  ? Generalized weakness 03/18/2020  ? 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[place]) 01/29/2020  ? Leptomeningeal metastases 01/29/2020  ? 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[place]) 01/28/2020  ? Adenocarcinoma of left lung, stage 4 ([place]) 01/18/2020  ? Encounter for antineoplastic chemotherapy 01/18/2020  ? Goals of care, counseling/discussion 01/18/2020  ? Malignant neoplasm of lower lobe, left bronchus or lung ([place]) 01/14/2020  ? Acute CVA (cerebrovascular accident) ([place]) 01/09/2020  ? Intracranial bleeding ([place]) 01/09/2020  ? TIA (transient ischemic attack) 01/08/2020  ? Lumbar foraminal stenosis 12/30/2019  ? Dizziness 09/25/2019  ? Vertigo 09/25/2019  ? Obstructive sleep apnea syndrome   ? Gastro-esophageal reflux disease without esophagitis   ? Anxiety   ? Hypothyroidism, unspecified   ? 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 Essential hypertension 06/04/2018  ? Left bundle branch block 06/04/2018  ? Dyslipidemia 06/04/2018  ? ? ?Orientation RESPIRATION BLADDER Height & Weight   ?  ?Self, Time, Situation, Place ? Normal Incontinent Weight: 56.9 kg ?Height:  5\' 5"  (165.1 cm)  ?BEHAVIORAL SYMPTOMS/MOOD NEUROLOGICAL BOWEL NUTRITION STATUS  ?    Continent Diet (Heart Heathy)  ?AMBULATORY STATUS COMMUNICATION OF NEEDS Skin   ?Extensive Assist Verbally Normal ?  ?  ?  ?    ?     ?     ? ? ?Personal Care Assistance Level of Assistance  ?Bathing, Feeding, Dressing Bathing Assistance: Limited assistance ?Feeding assistance: Independent ?  Dressing Assistance: Limited assistance ?   ? ?Functional Limitations Info  ?Sight, Hearing, Speech Sight Info: Adequate ?Hearing Info: Adequate ?Speech Info: Adequate  ? ? ?SPECIAL CARE FACTORS FREQUENCY  ?PT (By licensed PT), OT (By licensed OT)    ?  ?PT Frequency: 5x week ?OT Frequency: 5x week ?  ?  ?  ?   ? ? ?Contractures Contractures Info: Not present  ? ? ?Additional Factors Info  ?Code Status, Allergies Code Status Info: DNR ?Allergies Info: Sertraline Hcl ?  ?  ?  ?   ? ?Current Medications (08/08/2021):  This is the current hospital active medication list ?Current Facility-Administered Medications  ?Medication Dose Route Frequency Provider Last Rate Last Admin  ? acetaminophen (TYLENOL) tablet 650 mg  650 mg Oral Q6H PRN Marylyn Ishihara, Tyrone A, DO   650 mg at 08/08/21 7989  ? Or  ? acetaminophen (TYLENOL) suppository 650 mg  650 mg Rectal Q6H PRN Marylyn Ishihara, Tyrone A, DO      ? alum & mag hydroxide-simeth (MAALOX/MYLANTA) 200-200-20 MG/5ML suspension 30 mL  30 mL Oral Q8H PRN Wendee Beavers T, MD   30 mL at 08/08/21 0841  ? atorvastatin (LIPITOR) tablet 10 mg  10 mg Oral Daily Kyle, Tyrone A, DO   10 mg at 08/08/21 1153  ? enoxaparin (LOVENOX) injection 90 mg  1.5 mg/kg Subcutaneous Daily Wendee Beavers T, MD   90 mg at 08/08/21 1153  ? feeding supplement (BOOST / RESOURCE BREEZE) liquid 1 Container  1 Container Oral BID BM Gonfa, Taye T, MD      ? feeding supplement (ENSURE ENLIVE / ENSURE PLUS) liquid 237 mL  237 mL Oral BID BM Wendee Beavers T, MD   237 mL at 08/07/21 1352  ? FLUoxetine (PROZAC) capsule 20 mg  20 mg Oral Daily Kyle, Tyrone A, DO   20 mg at 08/08/21 1153  ? [START ON 08/10/2021] furosemide (LASIX) tablet 20 mg  20 mg Oral QODAY Wendee Beavers T, MD      ? levETIRAcetam (KEPPRA) tablet 250 mg  250 mg Oral BID Marylyn Ishihara, Tyrone A, DO   250 mg at 08/08/21 1209  ? levothyroxine (SYNTHROID) tablet 75 mcg  75 mcg Oral Q0600 Cherylann Ratel A, DO   75 mcg at 08/08/21 0610  ? MEDLINE mouth rinse  15 mL Mouth Rinse BID Gonfa, Taye T, MD   15 mL at 08/08/21 1210  ? metoprolol succinate (TOPROL-XL) 24 hr tablet 25 mg  25 mg Oral Daily Wendee Beavers T, MD   25 mg at 08/08/21 1153  ? multivitamin with minerals tablet 1 tablet  1 tablet Oral Daily Mercy Riding, MD   1 tablet at  08/08/21 1152  ? oxyCODONE (Oxy IR/ROXICODONE) immediate release tablet 5 mg  5 mg Oral Q4H PRN Marylyn Ishihara, Tyrone A, DO      ? pantoprazole (PROTONIX) EC tablet 40 mg  40 mg Oral Daily Wendee Beavers T, MD   40 mg at 08/08/21 0841  ? phenol (CHLORASEPTIC) mouth spray 1 spray  1 spray Mouth/Throat PRN Gonfa, Taye T, MD      ? polyethylene glycol (MIRALAX / GLYCOLAX) packet 17 g  17 g Oral BID PRN Mercy Riding, MD      ? polyvinyl alcohol (LIQUIFILM TEARS) 1.4 % ophthalmic solution 1 drop  1 drop Both Eyes Q6H PRN Marylyn Ishihara, Tyrone A, DO      ? potassium chloride SA (KLOR-CON M) CR tablet 40 mEq  40 mEq Oral Daily  Marylyn Ishihara, Tyrone A, DO   40 mEq at 08/08/21 1153  ? senna-docusate (Senokot-S) tablet 1 tablet  1 tablet Oral BID PRN Mercy Riding, MD      ? ? ? ?Discharge Medications: ?Please see discharge summary for a list of discharge medications. ? ?Relevant Imaging Results: ? ?Relevant Lab Results: ? ? ?Additional Information ?[phone] ? ?Anuhea Gassner, RN ? ? ? ? ?

## 2021-08-08 NOTE — Progress Notes (Addendum)
PT O2 Note ? ?Patient Details ?Name: Stefanie Camacho ?MRN: [id number] ?DOB: 03-22-1946 ? ?SATURATION QUALIFICATIONS: (This note is used to comply with regulatory documentation for home oxygen) ? ?Patient Saturations on Room Air at Rest = 95% ? ?Patient Saturations on Room Air while Ambulating = 94% ? ?Patient Saturations on n/a Liters of oxygen while Ambulating = n/a% ? ?Please briefly explain why patient needs home oxygen:Pt maintained O2 sats on RA - No O2 needs.\ ?Stefanie Camacho, PT ?Acute Rehab Services ?Pager [phone] ?[facility] [phone] ? ? ? ?Stefanie Camacho ?08/08/2021, 12:45 PM ?

## 2021-08-08 NOTE — Assessment & Plan Note (Signed)
Continue daily PT/OT at ALF ?

## 2021-08-08 NOTE — Progress Notes (Signed)
Called [facility] and was directed to [employer], med tech, to give report. ? ?Angie Fava, RN  ?

## 2021-08-08 NOTE — Progress Notes (Signed)
ANTICOAGULATION CONSULT NOTE - Follow Up Consult ? ?Pharmacy Consult for Enoxaparin ?Indication: pulmonary embolus ? ?Allergies  ?Allergen Reactions  ? Sertraline Hcl Other (See Comments)  ?  Severe diarrhea  ? ? ?Patient Measurements: ?Height: 5\' 5"  (165.1 cm) ?Weight: 56.9 kg (125 lb 7.1 oz) ?IBW/kg (Calculated) : 57 ?Heparin Dosing Weight:  ? ? ?Vital Signs: ?Temp: 98 ?F (36.7 ?C) (04/04 0330) ?Temp Source: Oral (04/04 0330) ?BP: 107/64 (04/04 0330) ?Pulse Rate: 94 (04/04 0330) ? ?Labs: ?Recent Labs  ?  08/06/21 ?1062 08/07/21 ?6948 08/08/21 ?5462  ?HGB 9.8* 9.5* 10.2*  ?HCT 29.8* 29.5* 31.0*  ?PLT 73* 78* 78*  ?CREATININE 0.96 0.84  --   ? ? ?Estimated Creatinine Clearance: 52.8 mL/min (by C-G formula based on SCr of 0.84 mg/dL). ? ? ?Medications:  ?Scheduled:  ? atorvastatin  10 mg Oral Daily  ? enoxaparin (LOVENOX) injection  1.5 mg/kg Subcutaneous Daily  ? feeding supplement  1 Container Oral BID BM  ? feeding supplement  237 mL Oral BID BM  ? FLUoxetine  20 mg Oral Daily  ? furosemide  20 mg Oral Daily  ? levETIRAcetam  250 mg Oral BID  ? levothyroxine  75 mcg Oral Q0600  ? mouth rinse  15 mL Mouth Rinse BID  ? metoprolol succinate  25 mg Oral Daily  ? multivitamin with minerals  1 tablet Oral Daily  ? pantoprazole  40 mg Oral Daily  ? potassium chloride  40 mEq Oral Daily  ? ?Infusions:  ? ?Assessment: ?80 yoF with PMH of metastatic lung cancer found to have new PE. Patient was on Apixaban PTA for hx of VTE, with last dose on 08/03/21.  Platelets chronically low. Pharmacy initially consulted for IV heparin dosing, then Lovenox dosing per oncology recommendations on 3/31 for discharge.   ? ?Today, 08/08/2021: ?SCr 0.84 ?CBC: Plt remain low/stable at 78k ?Weight:  Initially dosed using documented weight 59.9 kg, decreased to 56.5 kg, now at 56.9 kg ? ?Goal of Therapy:  ?Anti-Xa level 0.6-1 units/ml 4hrs after LMWH dose given ?Monitor platelets by anticoagulation protocol: Yes ?  ?Plan:  ?Continue Lovenox 90 mg  (~1.5 mg/kg) SQ Q24h ?Anti-Xa Level today 4 hours post dose ?Follow up CBC, renal function, weight, discharge planning.   ? ? ?Gretta Arab PharmD, BCPS ?Clinical Pharmacist ?[facility] main pharmacy [phone] ?08/08/2021 8:19 AM ?

## 2021-08-08 NOTE — Plan of Care (Signed)
?  Problem: Education: ?Goal: Knowledge of General Education information will improve ?Description: Including pain rating scale, medication(s)/side effects and non-pharmacologic comfort measures ?Outcome: Adequate for Discharge ?  ?Problem: Health Behavior/Discharge Planning: ?Goal: Ability to manage health-related needs will improve ?Outcome: Adequate for Discharge ?  ?Problem: Clinical Measurements: ?Goal: Ability to maintain clinical measurements within normal limits will improve ?Outcome: Adequate for Discharge ?Goal: Diagnostic test results will improve ?Outcome: Adequate for Discharge ?Goal: Respiratory complications will improve ?Outcome: Adequate for Discharge ?Goal: Cardiovascular complication will be avoided ?Outcome: Adequate for Discharge ?  ?Problem: Activity: ?Goal: Risk for activity intolerance will decrease ?Outcome: Adequate for Discharge ?  ?Problem: Nutrition: ?Goal: Adequate nutrition will be maintained ?Outcome: Adequate for Discharge ?  ?Problem: Pain Managment: ?Goal: General experience of comfort will improve ?Outcome: Adequate for Discharge ?  ?Problem: Safety: ?Goal: Ability to remain free from injury will improve ?Outcome: Adequate for Discharge ?  ?Problem: Skin Integrity: ?Goal: Risk for impaired skin integrity will decrease ?Outcome: Adequate for Discharge ?  ?Problem: Education: ?Goal: Ability to demonstrate management of disease process will improve ?Outcome: Adequate for Discharge ?Goal: Ability to verbalize understanding of medication therapies will improve ?Outcome: Adequate for Discharge ?Goal: Individualized Educational Video(s) ?Outcome: Adequate for Discharge ?  ?Problem: Activity: ?Goal: Capacity to carry out activities will improve ?Outcome: Adequate for Discharge ?  ?Problem: Cardiac: ?Goal: Ability to achieve and maintain adequate cardiopulmonary perfusion will improve ?Outcome: Adequate for Discharge ?  ?

## 2021-08-08 NOTE — TOC Progression Note (Signed)
Transition of Care (TOC) - Progression Note  ? ? ?Patient Details  ?Name: Stefanie Camacho ?MRN: [id number] ?Date of Birth: 1945-07-30 ? ?Transition of Care (TOC) CM/SW Contact  ?Purcell Mouton, RN ?Phone Number: ?08/08/2021, 1:47 PM ? ?Clinical Narrative:    ?Spoke with pt's son Randall Hiss concerning SNF this AM. Pt was accepted at [facility] and Insurance authorization approved [id number] next review 08/10/21, Everlene Balls [id number].  ? ? ?Expected Discharge Plan: Assisted Living ?Barriers to Discharge: No Barriers Identified ? ?Expected Discharge Plan and Services ?Expected Discharge Plan: Assisted Living ?  ?Discharge Planning Services: CM Consult ?  ?Living arrangements for the past 2 months: [place] ?                ?  ?  ?  ?  ?  ?  ?  ?  ?  ?  ? ? ?Social Determinants of Health (SDOH) Interventions ?  ? ?Readmission Risk Interventions ?   ? View : No data to display.  ?  ?  ?  ? ? ?

## 2021-08-08 NOTE — Progress Notes (Signed)
ANTICOAGULATION CONSULT NOTE - Follow Up Consult ? ?Pharmacy Consult for Lovenox ?Indication: hx of PE/DVT. ? ?Allergies  ?Allergen Reactions  ? Sertraline Hcl Other (See Comments)  ?  Severe diarrhea  ? ? ?Patient Measurements: ?Height: 5\' 5"  (165.1 cm) ?Weight: 56.9 kg (125 lb 7.1 oz) ?IBW/kg (Calculated) : 57 ? ?Vital Signs: ?Temp: 97.5 ?F (36.4 ?C) (04/04 1316) ?Temp Source: Oral (04/04 1316) ?BP: 101/58 (04/04 1316) ?Pulse Rate: 87 (04/04 1316) ? ?Labs: ?Recent Labs  ?  08/06/21 ?9179 08/07/21 ?1505 08/08/21 ?6979 08/08/21 ?1539  ?HGB 9.8* 9.5* 10.2*  --   ?HCT 29.8* 29.5* 31.0*  --   ?PLT 73* 78* 78*  --   ?HEPRLOWMOCWT  --   --   --  1.43  ?CREATININE 0.96 0.84  --   --   ? ? ?Estimated Creatinine Clearance: 52.8 mL/min (by C-G formula based on SCr of 0.84 mg/dL). ? ? ?Assessment: ?AC/Heme: on apixaban PTA for hx of PE/DVT. Started IV heparin for new PE on admit. Ok to continue heparin as long as Pltc at least 50K per Oncology  ?4/1: IV heparin > SQ enoxaparin ?4/4: LMWH level 1.43 (4 hrs post dose) in goal goal Anti-Xa range: 1.0 - 2.0.  ? ?Goal of Therapy:  ?Heparin level 1-2 units/ml ?Monitor platelets by anticoagulation protocol: Yes ?  ?Plan:  ?Con't Lovenox 90mg  SQ daily ? ? ?Benny Deutschman S. Alford Highland, PharmD, BCPS ?Clinical Staff Pharmacist [email or web address] ? ?Alford Highland, [employer] ?08/08/2021,4:45 PM ? ? ?

## 2021-08-08 NOTE — Progress Notes (Signed)
Discharge instructions provided to and reviewed with patient, patient's son, and patient daughter.  PIVs and telemetry removed.  Patient escorted to main entrance with belongings via wheelchair for transport to [facility] with daughter. ? ?Angie Fava, RN  ?

## 2021-08-08 NOTE — Progress Notes (Signed)
Physical Therapy Treatment ?Patient Details ?Name: Stefanie Camacho ?MRN: [id number] ?DOB: 10-Dec-1945 ?Today's Date: 08/08/2021 ? ? ?History of Present Illness Pt is 75 yo female presents to [facility] on 3/[facility] with shortness of breath x2 days. Pt found to have PE as well as extensive interstitial and airspace infiltrate suggestive of moderate pulmonary edema and bilat pleural effusions L>R; associated subtotal collapse of LLL; progressive pulmonary metastatic disease. Thoracentesis on 4/1 yielding 1.2 L of clear yellow fluid. Per MD note, pt likely in need of hospice within 6 months. 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 ? ?  ?PT Comments  ? ? Pt able to make gradual progress today.  She demonstrated improved orthostatic BP and tolerance for activity.  O2 sats were stable on RA.  She does require min A, cues, and fatigues easily.Continue plan of care. ? ?Orthostatic BPs ? ?Supine 106/57, HR 87bpm  ?Sitting 99/63 and HR 93 bpm  ?Sitting after 3 min and seated exercise 101/64 and HR 84 bpm  ?Standing 90/63 and HR 95 bpm  ?Standing after 3 min and walking 117/70 and HR 100 bpm  ? ?No episodes of dizziness ? ?O2 sats 95% rest on RA and 94% activity on RA.  No O2 needs. ? ?  ?Recommendations for follow up therapy are one component of a multi-disciplinary discharge planning process, led by the attending physician.  Recommendations may be updated based on patient status, additional functional criteria and insurance authorization. ? ?Follow Up Recommendations ? Skilled nursing-short term rehab (<3 hours/day) ?  ?  ?Assistance Recommended at Discharge Frequent or constant Supervision/Assistance  ?Patient can return home with the following A little help with bathing/dressing/bathroom;A little help with walking and/or transfers;Direct supervision/assist for medications  management;Assistance with cooking/housework;Assist for transportation ?  ?Equipment Recommendations ? None recommended by PT  ?  ?Recommendations for Other Services   ? ? ?  ?Precautions / Restrictions Precautions ?Precautions: Fall ?Precaution Comments: Orthostatic BP  ?  ? ?Mobility ? Bed Mobility ?Overal bed mobility: Needs Assistance ?Bed Mobility: Supine to Sit, Sit to Supine ?  ?  ?Supine to sit: Min assist ?Sit to supine: Min assist ?  ?General bed mobility comments: increased time with min A for trunk ?  ? ?Transfers ?Overall transfer level: Needs assistance ?Equipment used: Rolling walker (2 wheels) ?Transfers: Sit to/from Stand ?Sit to Stand: Min assist ?  ?  ?  ?  ?  ?General transfer comment: Performed x 3 with light min A to rise. Cues for hand placement.  Min A to slow sitting on low toielt ?  ? ?Ambulation/Gait ?Ambulation/Gait assistance: Min assist ?Gait Distance (Feet): 15 Feet (15'x3) ?Assistive device: Rolling walker (2 wheels) ?Gait Pattern/deviations: Step-to pattern, Decreased stride length, Shuffle ?Gait velocity: decreased ?  ?  ?General Gait Details: Pt fatigues easily but was able to ambulate 15'x3 with seated rest breaks. Min A to steady ? ? ?Stairs ?  ?  ?  ?  ?  ? ? ?Wheelchair Mobility ?  ? ?Modified Rankin (Stroke Patients Only) ?  ? ? ?  ?Balance Overall balance assessment: Needs assistance ?Sitting-balance support: No upper extremity supported, Feet supported ?Sitting balance-Leahy Scale: Fair ?  ?  ?Standing balance support: Bilateral upper extremity supported, Reliant on assistive device for balance ?Standing balance-Leahy Scale: Poor ?Standing balance comment: Requiring RW and min guard.  PT assisted with ADLs as pt unable to release  RW ?  ?  ?  ?  ?  ?  ?  ?  ?  ?  ?  ?  ? ?  ?Cognition Arousal/Alertness: Awake/alert ?Behavior During Therapy: [facility] for tasks assessed/performed ?Overall Cognitive Status: Within Functional Limits for tasks assessed ?  ?  ?  ?  ?  ?  ?  ?  ?  ?  ?   ?  ?  ?  ?  ?  ?  ?  ?  ? ?  ?Exercises General Exercises - Lower Extremity ?Long Arc Quad: AROM, Both, 10 reps, Seated ?Hip ABduction/ADduction: AROM, Both, 10 reps, Seated (pillow squeeze) ?Hip Flexion/Marching: AROM, Both, 10 reps, Seated ? ?  ?General Comments   ?  ?  ? ?Pertinent Vitals/Pain Pain Assessment ?Pain Assessment: No/denies pain  ? ? ?Home Living   ?  ?  ?  ?  ?  ?  ?  ?  ?  ?   ?  ?Prior Function    ?  ?  ?   ? ?PT Goals (current goals can now be found in the care plan section) Progress towards PT goals: Progressing toward goals ? ?  ?Frequency ? ? ? Min 3X/week ? ? ? ?  ?PT Plan Current plan remains appropriate  ? ? ?Co-evaluation   ?  ?  ?  ?  ? ?  ?AM-PAC PT "6 Clicks" Mobility   ?Outcome Measure ? Help needed turning from your back to your side while in a flat bed without using bedrails?: A Little ?Help needed moving from lying on your back to sitting on the side of a flat bed without using bedrails?: A Little ?Help needed moving to and from a bed to a chair (including a wheelchair)?: A Little ?Help needed standing up from a chair using your arms (e.g., wheelchair or bedside chair)?: A Little ?Help needed to walk in hospital room?: Total ?Help needed climbing 3-5 steps with a railing? : Total ?6 Click Score: 14 ? ?  ?End of Session Equipment Utilized During Treatment: Gait belt ?Activity Tolerance: Patient tolerated treatment well ?Patient left: in bed;with call bell/phone within reach;with bed alarm set ?Nurse Communication: Mobility status ?PT Visit Diagnosis: Other abnormalities of gait and mobility (R26.89);Muscle weakness (generalized) (M62.81) ?  ? ? ?Time: 1610-9604 ?PT Time Calculation (min) (ACUTE ONLY): 26 min ? ?Charges:  $Gait Training: 8-22 mins ?$Therapeutic Activity: 8-22 mins          ?          ? ?Abran Richard, PT ?Acute Rehab Services ?Pager [phone] ?[facility] [phone] ? ? ? ?Stefanie Camacho ?08/08/2021, 1:29 PM ? ?

## 2021-08-08 NOTE — Discharge Summary (Addendum)
Physician Discharge Summary  Catrena Thurn GEX:[id number] DOB: 06/10/1945 DOA: 08/03/2021  PCP: Merri Brunette, MD  Admit date: 08/03/2021 Discharge date: 08/08/2021 Admitted From: ALF Disposition: ALF Recommendations for Outpatient Follow-up:  Follow up with cardiology in 2-3 weeks Continue PT/OT at ALF Palliative follow up at ALF Oncology to arrange outpatient follow up Please obtain CBC/BMP/Mag at follow up Please follow up on the following pending results: none   Discharge Condition: Stable but guarded prognosis CODE STATUS: DNR  Follow-up Information     Georgeanna Lea, MD Follow up.   Specialty: Cardiology Why: Cardiology office will call you to arrange follow-up labs and appointment. Contact information: 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 Shirline Frees, pulmonary embolism on Eliquis, combined CHF/NICM, CVA, CKD-3A, OSA not on CPAP, hypothyroidism, anxiety, depression and debility presenting from ILF with shortness of breath and fatigue and found to have acute subsegmental PE in RLL.  CTA chest also concerning for progressive pulmonary and and widespread osseous mets and moderate pulmonary edema with associated bilateral pleural effusion, left> right with associated sepsis total collapse of the LLL.  Patient was started on IV heparin and IV Lasix.  TTE ordered.  Oncology consulted.  Concern about Eliquis failure.  Oncology recommended Lovenox on discharge.  TTE with LVEF of 30 to 35%, G2 DD and GH (worse than prior).  Cardiology consulted and gave recommendations and signed off.  Patient has significant orthostatic hypotension.  Metoprolol reduced.  Other cardiac meds discontinued.  Eventually, orthostatic hypotension improved.  Losartan and  Aldactone discontinued on discharge.  Toprol-XL reduced to 25 mg daily.  She tolerated p.o. Lasix 20 mg for 2 days.  Discharged on p.o. Lasix 20 mg every other day.  Recommend outpatient follow-up with a cardiologist in 2 to 3 weeks.  In regards to respiratory failure, she required up to 5 L to maintain appropriate saturation.  She underwent thoracocentesis with removal of 1.2 exudative fluid from left chest 1/1.  She also received IV Lasix initially.  Eventually, she was liberated off oxygen.  She was ambulated on room air and maintained saturation greater than 95%.  In regards to lung cancer, reached out to oncology on the day of discharge.  Oncology recommended holding Tagrisso until follow-up for further evaluation and discussion of treatment option.  In regards to pulmonary embolism while on Eliquis, she is discharged on Lovenox injection per recommendation by oncology.  Therapy recommended SNF but patient family to go back previous ALF after talking staff at [facility].   See individual problem list below for more on hospital course.  Problems addressed during this hospitalization Problem  Acute Respiratory Failure With Hypoxia (Hcc)  Acute Pulmonary Embolism (Hcc)  Orthostatic hypotension in patient with history of hypertension  Bilateral Pleural Effusion  Goals of Care, Counseling/Discussion  Acute on chronic combined CHF/NICM   Ejection fraction 45% in summer 2019   Malnutrition of moderate degree  Thrombocytopenia (Hcc)  Adenocarcinoma of Left Lung, Stage 4 (Hcc)  Physical Deconditioning  Constipation  Anxiety  Hypothyroidism, Unspecified  Essential Hypertension    Assessment and Plan: * Acute pulmonary embolism (HCC) Acute subsegmental PE in RLL noted on CTA chest.  Patient reports good compliance with Eliquis  which is concerning for Eliquis failure.  Oncology consulted and recommended subcu Lovenox.  No radiologic evidence of right heart strain.  LE Doppler negative  for DVT. -Transitioned to subcu Lovenox per oncology  Acute respiratory failure with hypoxia (HCC) Multifactorial including underlying CHF, pleural effusion, lung cancer, possible drug-induced pneumonitis from Tagrisso and possibly PE although small.  Desaturated to 81% on RA and required up to 5 L at one-point.  Maintain saturation greater than 95% with ambulation on room air on the day of discharge. -Recommend continuing incentive spirometry and staying out of bed -Continue subcu Lovenox for pulmonary embolism and p.o. Lasix for CHF  Orthostatic hypotension in patient with history of hypertension Likely from progressive CHF and cardiac meds.  TTE as above.  Resolved after adjusting cardiac meds. -Adjusted cardiac meds as above. -Continue TED hose and head of bed elevation to greater than 30 degree -Encourage staying out of the bed  Bilateral pleural effusion CTA chest showed bilateral pleural effusion, left> right.  Had thoracocentesis with removal of 1.2 L exudative fluid likely from extensive lung cancer +/- CHF.  Improved aeration and oxygenation. -Continue encouraging incentive spirometry and staying out of bed  Goals of care, counseling/discussion DNR/DNI.  Palliative medicine consulted.  Recommended palliative follow-up at SNF  Acute on chronic combined CHF/NICM CTA chest concerning for pulmonary edema and pleural effusion..  BNP > 4500 but no prior value for comparison.  TTE with LVEF of 30 to 35% (worse), G2-DD (worse) and GH.  Initially diuresed with IV Lasix which was discontinued due to orthostatic hypotension.   -Cardiology signed off. -Discharged on p.o. Lasix 20 mg every other day -Decreased Toprol-XL from 75 to to 25 mg daily -Discontinue losartan and Aldactone. -Outpatient follow-up with cardiology in 2 to 3 weeks  Malnutrition of moderate degree Nutrition Status: Nutrition Problem: Moderate Malnutrition Etiology: chronic illness, cancer and cancer related  treatments Signs/Symptoms: moderate fat depletion, moderate muscle depletion Interventions: Ensure Enlive (each supplement provides 350kcal and 20 grams of protein), Boost Breeze, MVI  Thrombocytopenia (HCC) Recent Labs  Lab 08/03/21 2319 08/04/21 0353 08/05/21 0615 08/06/21 0344 08/07/21 0324 08/08/21 0352  PLT 89* 89* 72* 73* 78* 78*  Slowly improving.  No signs of bleeding.  Recheck CBC in 1 week or sooner if needed   Adenocarcinoma of left lung, stage 4 (HCC) S/p whole brain radiation andradiation.  Currently on Tagrisso.  Followed by oncology.  CTA with progressive pulmonary metastasis and widespread osseous metastasis -Oncology recommended holding Tagrisso on discharge until outpatient follow-up  Physical deconditioning Continue daily PT/OT at ALF  Constipation Continue bowel regimen.  Anxiety Continue Prozac  Hypothyroidism, unspecified Continue home Synthroid   Vital signs Vitals:   08/07/21 2025 08/08/21 0330 08/08/21 0500 08/08/21 1316  BP: 102/61 107/64  (!) 101/58  Pulse: 92 94  87  Temp: 98.9 F (37.2 C) 98 F (36.7 C)  (!) 97.5 F (36.4 C)  Resp: (!) 24 19  20   Height:      Weight:   56.9 kg   SpO2: 93% 90%  97%  TempSrc: Oral Oral  Oral  BMI (Calculated):   20.87      Discharge exam  GENERAL: Appears frail.  Nontoxic. HEENT: MMM.  Vision and hearing grossly intact.  NECK: Supple.  No apparent JVD.  RESP:  No IWOB.  Fair aeration bilaterally.  Improved aeration in left lung. CVS:  RRR. Heart sounds normal.  ABD/GI/GU: BS+. Abd soft, NTND.  MSK/EXT:  Moves extremities. No apparent  deformity. No edema.  SKIN: no apparent skin lesion or wound NEURO: Awake and alert. Oriented appropriately.  No apparent focal neuro deficit. PSYCH: Calm. Normal affect.   Discharge Instructions Discharge Instructions     Diet - low sodium heart healthy   Complete by: As directed    Increase activity slowly   Complete by: As directed       Allergies as  of 08/08/2021       Reactions   Sertraline Hcl Other (See Comments)   Severe diarrhea        Medication List     STOP taking these medications    apixaban 5 MG Tabs tablet Commonly known as: ELIQUIS   losartan 50 MG tablet Commonly known as: COZAAR   osimertinib mesylate 80 MG tablet Commonly known as: TAGRISSO   prochlorperazine 10 MG tablet Commonly known as: COMPAZINE   spironolactone 25 MG tablet Commonly known as: ALDACTONE       TAKE these medications    atorvastatin 10 MG tablet Commonly known as: LIPITOR Take 10 mg by mouth daily.   enoxaparin 100 MG/ML injection Commonly known as: LOVENOX Inject 0.9 mLs (90 mg total) into the skin daily. Start taking on: August 09, 2021   Ensure Take 1 Can by mouth as needed (nutritional supplement).   FLUoxetine 20 MG capsule Commonly known as: PROZAC Take 20 mg by mouth daily.   furosemide 20 MG tablet Commonly known as: LASIX Take 1 tablet (20 mg total) by mouth every other day. Start taking on: August 10, 2021   levETIRAcetam 250 MG tablet Commonly known as: KEPPRA Take 250 mg by mouth 2 (two) times daily.   levothyroxine 75 MCG tablet Commonly known as: SYNTHROID Take 1 tablet (75 mcg total) by mouth daily.   loperamide 2 MG capsule Commonly known as: IMODIUM Take 2 mg by mouth every 6 (six) hours as needed for diarrhea or loose stools.   metoprolol succinate 25 MG 24 hr tablet Commonly known as: TOPROL-XL Take 1 tablet (25 mg total) by mouth daily. Start taking on: August 09, 2021 What changed: how much to take   multivitamin with minerals Tabs tablet Take 1 tablet by mouth daily. Start taking on: August 09, 2021   ondansetron 4 MG tablet Commonly known as: Zofran Take 1 tablet (4 mg total) by mouth daily as needed for nausea or vomiting.   pantoprazole 40 MG tablet Commonly known as: PROTONIX Take 1 tablet by mouth once daily What changed: when to take this   polyethylene glycol 17 g  packet Commonly known as: MIRALAX / GLYCOLAX Take 17 g by mouth 2 (two) times daily as needed for mild constipation.   senna-docusate 8.6-50 MG tablet Commonly known as: Senokot-S Take 1 tablet by mouth 2 (two) times daily as needed for moderate constipation.   SYSTANE HYDRATION PF OP Apply 1 drop to eye every 6 (six) hours as needed (dry eyes).        Consultations: Oncology Palliative medicine Cardiology Interventional radiology  Procedures/Studies: Left thoracocentesis with removal of 1.2 L exudative fluid on 4/1.   DG Chest 1 View  Result Date: 08/05/2021 CLINICAL DATA:  Status post left thoracentesis. History of metastatic lung cancer EXAM: CHEST  1 VIEW COMPARISON:  08/03/2021 FINDINGS: Stable cardiomediastinal contours. Moderate left-sided pleural effusion, slightly decreased in size from prior. No pneumothorax. Bilateral reticulonodular opacities, similar to prior. Widespread sclerotic osseous metastatic lesions. IMPRESSION: Slight decrease in size of left pleural effusion status post thoracentesis. No  pneumothorax. Electronically Signed   By: Duanne Guess D.O.   On: 08/05/2021 13:48   DG Chest 2 View  Result Date: 08/03/2021 CLINICAL DATA:  Shortness of breath.  Metastatic lung cancer. EXAM: CHEST - 2 VIEW COMPARISON:  CT dated 05/19/2021. FINDINGS: Moderate left pleural effusion and left lung base atelectasis or infiltrate. There is diffuse interstitial and vascular prominence which may represent edema, or pneumonia. Metastatic disease is not excluded. No pneumothorax. The cardiac borders are silhouetted. Degenerative changes of the spine. Bilateral humeral sclerotic lesions consistent with metastatic disease. No acute osseous pathology. IMPRESSION: 1. Moderate left pleural effusion and left lung base atelectasis or infiltrate. 2. Diffuse interstitial and vascular prominence may represent edema, or pneumonia. Electronically Signed   By: Elgie Collard M.D.   On:  08/03/2021 23:40   DG Shoulder Right  Result Date: 08/01/2021 CLINICAL DATA:  Right shoulder pain, no known injury. EXAM: RIGHT SHOULDER - 2+ VIEW COMPARISON:  None. FINDINGS: There is no evidence of fracture or dislocation. Osteopenia with mixed lytic/sclerotic appearance of the osseous structures. Mild glenohumeral and moderate acromioclavicular osteoarthritis. High-riding humeral head concerning for rotator cuff arthropathy. IMPRESSION: 1. Diffuse osteopenia. Mixed lytic/sclerotic appearance of the osseous structures, which may be secondary to metastatic disease. No acute fracture. 2. Mild glenohumeral and moderate acromioclavicular osteoarthritis. 3. High-riding humeral head concerning for rotator cuff arthropathy. Electronically Signed   By: Larose Hires D.O.   On: 08/01/2021 10:45   CT Angio Chest PE W and/or Wo Contrast  Result Date: 08/04/2021 CLINICAL DATA:  Chest pain or SOB, pleurisy or effusion suspected. Dyspnea on exertion. Metastatic lung cancer. EXAM: CT ANGIOGRAPHY CHEST WITH CONTRAST TECHNIQUE: Multidetector CT imaging of the chest was performed using the standard protocol during bolus administration of intravenous contrast. Multiplanar CT image reconstructions and MIPs were obtained to evaluate the vascular anatomy. RADIATION DOSE REDUCTION: This exam was performed according to the departmental dose-optimization program which includes automated exposure control, adjustment of the mA and/or kV according to patient size and/or use of iterative reconstruction technique. CONTRAST:  75mL OMNIPAQUE IOHEXOL 350 MG/ML SOLN COMPARISON:  05/19/2021 FINDINGS: Cardiovascular: There is adequate opacification of the pulmonary arterial tree. There is a single subsegmental pulmonary arterial filling defect within the right lower lobe on axial image # 88/6 in keeping with an acute pulmonary embolism. There is, additionally, a small intraluminal web within the right lower lobar pulmonary artery which is  stable from prior examination and in keeping with the sequela of remote pulmonary embolism. The overall embolic burden is tiny. There is no CT evidence of right heart strain. The central pulmonary arteries are enlarged in keeping with changes of pulmonary arterial hypertension. Mild global cardiomegaly with left ventricular dilation. Minimal coronary artery calcification. Trace pericardial effusion. Minimal atherosclerotic calcification within the thoracic aorta. No aortic aneurysm. Mediastinum/Nodes: No enlarged mediastinal, hilar, or axillary lymph nodes. Thyroid gland, trachea, and esophagus demonstrate no significant findings. Lungs/Pleura: Small right and large left pleural effusions have developed. There is compressive atelectasis noted with subtotal collapse of the left lower lobe. Numerous pulmonary nodules are seen scattered throughout the aerated lungs which have increased in both size and number since prior examination in keeping with progressive metastatic disease. There has developed interlobular septal thickening most in keeping with interstitial pulmonary edema. Additionally, extensive airspace infiltrate has developed throughout the aerated lungs which may represent alveolar pulmonary edema. Inflammatory infiltrate as can be seen with drug reaction or acute pneumonic infiltrate, however, could appear similarly. Stable chronic consolidation  of the perihilar lingula, likely reflecting post treatment change. No pneumothorax. No central obstructing lesion. Upper Abdomen: No acute abnormality. Musculoskeletal: Widespread sclerotic metastatic disease is again seen throughout the visualized axial skeleton. Review of the MIP images confirms the above findings. IMPRESSION: Acute subsegmental pulmonary embolus within the right lower lobe. Embolic burden is tiny. No CT evidence of right heart strain. Interval development of extensive interstitial and airspace infiltrate most suggestive of moderate pulmonary  edema with associated bilateral pleural effusions, left greater than right. Associated subtotal collapse of the left lower lobe. Note that superimposed acute inflammatory or infectious infiltrate, however, could appear similarly. Progressive pulmonary metastatic disease. Widespread osseous metastatic disease again noted. Mild coronary artery calcification. Cardiomegaly. Morphologic changes in keeping with pulmonary arterial hypertension. Aortic Atherosclerosis (ICD10-I70.0). Electronically Signed   By: Helyn Numbers M.D.   On: 08/04/2021 00:49   ECHOCARDIOGRAM COMPLETE  Result Date: 08/04/2021    ECHOCARDIOGRAM REPORT   Patient Name:   EUNITA KERSTIENS Date of Exam: 08/04/2021 Medical Rec #:  [id number]     Height:       65.0 in Accession #:    [id number]    Weight:       138.0 lb Date of Birth:  [birth year]     BSA:          1.690 m Patient Age:    76 years      BP:           121/80 mmHg Patient Gender: F             HR:           89 bpm. Exam Location:  Inpatient Procedure: 2D Echo, Cardiac Doppler, Color Doppler and Intracardiac            Opacification Agent Indications:    Pulmonary Embolus I26.09  History:        Patient has prior history of Echocardiogram examinations, most                 recent 06/08/2020. Stroke, Arrythmias:LBBB; Risk                 Factors:Dyslipidemia and Sleep Apnea. Ischemic cardiomyopathy,                 hypothyroidism. Cancer.  Sonographer:    Leta Jungling RDCS Referring Phys: [id number] Teddy Spike IMPRESSIONS  1. Left ventricular ejection fraction, by estimation, is 30 to 35%. The left ventricle has moderately decreased function. The left ventricle demonstrates global hypokinesis. Left ventricular diastolic parameters are consistent with Grade II diastolic dysfunction (pseudonormalization).  2. Right ventricular systolic function is normal. The right ventricular size is normal. There is normal pulmonary artery systolic pressure.  3. Left atrial size was moderately dilated.  4.  Moderate pleural effusion in both left and right lateral regions.  5. The mitral valve is normal in structure. Moderate mitral valve regurgitation. No evidence of mitral stenosis.  6. The aortic valve is tricuspid. Aortic valve regurgitation is trivial. No aortic stenosis is present.  7. Moderately dilated pulmonary artery.  8. The inferior vena cava is normal in size with greater than 50% respiratory variability, suggesting right atrial pressure of 3 mmHg. Comparison(s): Changes from prior study are noted. EF slightly decreased compared to prior (40-45%). FINDINGS  Left Ventricle: Left ventricular ejection fraction, by estimation, is 30 to 35%. The left ventricle has moderately decreased function. The left ventricle demonstrates global hypokinesis. Definity contrast agent was given IV to delineate  the left ventricular endocardial borders. The left ventricular internal cavity size was normal in size. There is no left ventricular hypertrophy. Left ventricular diastolic parameters are consistent with Grade II diastolic dysfunction (pseudonormalization). Right Ventricle: The right ventricular size is normal. No increase in right ventricular wall thickness. Right ventricular systolic function is normal. There is normal pulmonary artery systolic pressure. The tricuspid regurgitant velocity is 2.64 m/s, and  with an assumed right atrial pressure of 3 mmHg, the estimated right ventricular systolic pressure is 30.9 mmHg. Left Atrium: Left atrial size was moderately dilated. Right Atrium: Right atrial size was normal in size. Pericardium: Trivial pericardial effusion is present. Mitral Valve: The mitral valve is normal in structure. Moderate mitral valve regurgitation. No evidence of mitral valve stenosis. Tricuspid Valve: The tricuspid valve is normal in structure. Tricuspid valve regurgitation is mild . No evidence of tricuspid stenosis. Aortic Valve: The aortic valve is tricuspid. Aortic valve regurgitation is trivial. No  aortic stenosis is present. Pulmonic Valve: The pulmonic valve was not well visualized. Pulmonic valve regurgitation is mild. No evidence of pulmonic stenosis. Aorta: The aortic root, ascending aorta, aortic arch and descending aorta are all structurally normal, with no evidence of dilitation or obstruction. Pulmonary Artery: The pulmonary artery is moderately dilated. Venous: The inferior vena cava is normal in size with greater than 50% respiratory variability, suggesting right atrial pressure of 3 mmHg. IAS/Shunts: The atrial septum is grossly normal. Additional Comments: There is a moderate pleural effusion in both left and right lateral regions.  LEFT VENTRICLE PLAX 2D LVIDd:         5.90 cm      Diastology LVIDs:         4.95 cm      LV e' medial:    3.81 cm/s LV PW:         0.70 cm      LV E/e' medial:  14.5 LV IVS:        0.70 cm      LV e' lateral:   6.00 cm/s                             LV E/e' lateral: 9.2  LV Volumes (MOD) LV vol d, MOD A2C: 188.0 ml LV vol d, MOD A4C: 203.0 ml LV vol s, MOD A2C: 146.0 ml LV vol s, MOD A4C: 122.0 ml LV SV MOD A2C:     42.0 ml LV SV MOD A4C:     203.0 ml LV SV MOD BP:      63.8 ml RIGHT VENTRICLE RV S prime:     14.90 cm/s TAPSE (M-mode): 2.2 cm LEFT ATRIUM             Index        RIGHT ATRIUM           Index LA diam:        4.15 cm 2.46 cm/m   RA Area:     14.00 cm LA Vol (A2C):   65.2 ml 38.59 ml/m  RA Volume:   33.60 ml  19.89 ml/m LA Vol (A4C):   76.4 ml 45.22 ml/m LA Biplane Vol: 70.4 ml 41.67 ml/m  AORTIC VALVE             PULMONIC VALVE LVOT Vmax:   65.30 cm/s  PR End Diast Vel: 2.04 msec LVOT Vmean:  43.200 cm/s LVOT VTI:    0.093 m  AORTA Ao  Root diam: 3.00 cm Ao Asc diam:  3.30 cm MITRAL VALVE                  TRICUSPID VALVE MV Area (PHT):  5.58 cm      TR Peak grad:   27.9 mmHg MV Area (plan): 5.85 cm      TR Vmax:        264.00 cm/s MV Decel Time:  136 msec MR Peak grad:    68.6 mmHg    SHUNTS MR Mean grad:    39.0 mmHg    Systemic VTI: 0.09 m MR  Vmax:         414.00 cm/s MR Vmean:        295.0 cm/s MR PISA:         1.57 cm MR PISA Eff ROA: 15 mm MR PISA Radius:  0.50 cm MV E velocity: 55.20 cm/s MV A velocity: 56.20 cm/s MV E/A ratio:  0.98 Jodelle Red MD Electronically signed by Jodelle Red MD Signature Date/Time: 08/04/2021/5:04:47 PM    Final    VAS [place] LOWER EXTREMITY VENOUS (DVT)  Result Date: 08/05/2021  Lower Venous DVT Study Patient Name:  CHASELYN MARONE  Date of Exam:   08/04/2021 Medical Rec #: [id number]      Accession #:    [id number] Date of Birth: 08/13/1945      Patient Gender: F Patient Age:   44 years Exam Location:  [facility] Procedure:      VAS [place] LOWER EXTREMITY VENOUS (DVT) Referring Phys: Margie Ege --------------------------------------------------------------------------------  Indications: Pulmonary embolism.  Risk Factors: None identified. Comparison Study: No prior studies. Performing Technologist: Chanda Busing RVT  Examination Guidelines: A complete evaluation includes B-mode imaging, spectral Doppler, color Doppler, and power Doppler as needed of all accessible portions of each vessel. Bilateral testing is considered an integral part of a complete examination. Limited examinations for reoccurring indications may be performed as noted. The reflux portion of the exam is performed with the patient in reverse Trendelenburg.  +---------+---------------+---------+-----------+----------+--------------+ RIGHT    CompressibilityPhasicitySpontaneityPropertiesThrombus Aging +---------+---------------+---------+-----------+----------+--------------+ CFV      Full           Yes      Yes                                 +---------+---------------+---------+-----------+----------+--------------+ SFJ      Full                                                        +---------+---------------+---------+-----------+----------+--------------+ FV Prox  Full                                                         +---------+---------------+---------+-----------+----------+--------------+ FV Mid   Full                                                        +---------+---------------+---------+-----------+----------+--------------+ FV  DistalFull                                                        +---------+---------------+---------+-----------+----------+--------------+ PFV      Full                                                        +---------+---------------+---------+-----------+----------+--------------+ POP      Full           Yes      Yes                                 +---------+---------------+---------+-----------+----------+--------------+ PTV      Full                                                        +---------+---------------+---------+-----------+----------+--------------+ PERO     Full                                                        +---------+---------------+---------+-----------+----------+--------------+   +---------+---------------+---------+-----------+----------+--------------+ LEFT     CompressibilityPhasicitySpontaneityPropertiesThrombus Aging +---------+---------------+---------+-----------+----------+--------------+ CFV      Full           Yes      Yes                                 +---------+---------------+---------+-----------+----------+--------------+ SFJ      Full                                                        +---------+---------------+---------+-----------+----------+--------------+ FV Prox  Full                                                        +---------+---------------+---------+-----------+----------+--------------+ FV Mid   Full                                                        +---------+---------------+---------+-----------+----------+--------------+ FV DistalFull                                                         +---------+---------------+---------+-----------+----------+--------------+  PFV      Full                                                        +---------+---------------+---------+-----------+----------+--------------+ POP      Full           Yes      Yes                                 +---------+---------------+---------+-----------+----------+--------------+ PTV      Full                                                        +---------+---------------+---------+-----------+----------+--------------+ PERO     Full                                                        +---------+---------------+---------+-----------+----------+--------------+     Summary: RIGHT: - There is no evidence of deep vein thrombosis in the lower extremity.  - No cystic structure found in the popliteal fossa.  LEFT: - There is no evidence of deep vein thrombosis in the lower extremity.  - No cystic structure found in the popliteal fossa.  *See table(s) above for measurements and observations. Electronically signed by Coral Else MD on 08/05/2021 at 3:21:12 AM.    Final    US THORACENTESIS ASP PLEURAL SPACE W/IMG GUIDE  Result Date: 08/05/2021 INDICATION: Patient with a history of metastatic lung cancer presents today with a left pleural effusion. Interventional radiology asked to perform a diagnostic and therapeutic thoracentesis. EXAM: ULTRASOUND GUIDED THORACENTESIS MEDICATIONS: 1% lidocaine 10 mL COMPLICATIONS: None immediate. PROCEDURE: An ultrasound guided thoracentesis was thoroughly discussed with the patient and questions answered. The benefits, risks, alternatives and complications were also discussed. The patient understands and wishes to proceed with the procedure. Written consent was obtained. Ultrasound was performed to localize and mark an adequate pocket of fluid in the left chest. The area was then prepped and draped in the normal sterile fashion. 1% Lidocaine was used for local anesthesia.  Under ultrasound guidance a 6 Fr Safe-T-Centesis catheter was introduced. Thoracentesis was performed. The catheter was removed and a dressing applied. FINDINGS: A total of approximately 1.2 L of clear yellow fluid was removed. Samples were sent to the laboratory as requested by the clinical team. IMPRESSION: Successful ultrasound guided left thoracentesis yielding 1.2 L of pleural fluid. Read by: Alwyn Ren, NP Electronically Signed   By: Richarda Overlie M.D.   On: 08/05/2021 14:09       The results of significant diagnostics from this hospitalization (including imaging, microbiology, ancillary and laboratory) are listed below for reference.     Microbiology: Recent Results (from the past 240 hour(s))  Resp Panel by RT-PCR (Flu A&B, Covid) Nasopharyngeal Swab     Status: None   Collection Time: 08/04/21 12:04 AM   Specimen: Nasopharyngeal Swab; Nasopharyngeal(NP) swabs in vial transport medium  Result Value Ref Range  Status   SARS Coronavirus 2 by RT PCR NEGATIVE NEGATIVE Final    Comment: (NOTE) SARS-CoV-2 target nucleic acids are NOT DETECTED.  The SARS-CoV-2 RNA is generally detectable in upper respiratory specimens during the acute phase of infection. The lowest concentration of SARS-CoV-2 viral copies this assay can detect is 138 copies/mL. A negative result does not preclude SARS-Cov-2 infection and should not be used as the sole basis for treatment or other patient management decisions. A negative result may occur with  improper specimen collection/handling, submission of specimen other than nasopharyngeal swab, presence of viral mutation(s) within the areas targeted by this assay, and inadequate number of viral copies(<138 copies/mL). A negative result must be combined with clinical observations, patient history, and epidemiological information. The expected result is Negative.  Fact Sheet for Patients:  BloggerCourse.com  Fact Sheet for Healthcare  Providers:  SeriousBroker.it  This test is no t yet approved or cleared by the [place] FDA and  has been authorized for detection and/or diagnosis of SARS-CoV-2 by FDA under an Emergency Use Authorization (EUA). This EUA will remain  in effect (meaning this test can be used) for the duration of the COVID-19 declaration under Section 564(b)(1) of the Act, 21 U.S.C.section 360bbb-3(b)(1), unless the authorization is terminated  or revoked sooner.       Influenza A by PCR NEGATIVE NEGATIVE Final   Influenza B by PCR NEGATIVE NEGATIVE Final    Comment: (NOTE) The Xpert Xpress SARS-CoV-2/FLU/RSV plus assay is intended as an aid in the diagnosis of influenza from Nasopharyngeal swab specimens and should not be used as a sole basis for treatment. Nasal washings and aspirates are unacceptable for Xpert Xpress SARS-CoV-2/FLU/RSV testing.  Fact Sheet for Patients: BloggerCourse.com  Fact Sheet for Healthcare Providers: SeriousBroker.it  This test is not yet approved or cleared by the [place] FDA and has been authorized for detection and/or diagnosis of SARS-CoV-2 by FDA under an Emergency Use Authorization (EUA). This EUA will remain in effect (meaning this test can be used) for the duration of the COVID-19 declaration under Section 564(b)(1) of the Act, 21 U.S.C. section 360bbb-3(b)(1), unless the authorization is terminated or revoked.  Performed at [facility], [address] [place], [place] [address]      Labs:  CBC: Recent Labs  Lab 08/03/21 2319 08/04/21 0353 08/05/21 0615 08/06/21 0344 08/07/21 0324 08/08/21 0352  WBC 5.4 6.5 5.4 4.7 4.2 4.1  NEUTROABS 4.1  --   --   --   --   --   HGB 10.7* 11.7* 10.3* 9.8* 9.5* 10.2*  HCT 32.2* 34.9* 31.9* 29.8* 29.5* 31.0*  MCV 90.4 92.3 95.2 92.5 93.4 92.3  PLT 89* 89* 72* 73* 78* 78*   BMP &GFR Recent Labs  Lab  08/03/21 2319 08/04/21 0953 08/05/21 0615 08/06/21 0344 08/07/21 0324  NA 137 136 135 135 134*  K 3.5 3.4* 3.8 4.0 4.1  CL 105 103 102 100 102  CO2 20* 22 25 26 24   GLUCOSE 103* 128* 100* 95 93  BUN 24* 23 23 30* 32*  CREATININE 0.85 0.94 0.94 0.96 0.84  CALCIUM 9.1 8.6* 8.7* 9.1 9.2  MG  --  1.4* 2.4 2.1 2.0  PHOS  --   --   --  2.9 2.8   Estimated Creatinine Clearance: 52.8 mL/min (by C-G formula based on SCr of 0.84 mg/dL). Liver & Pancreas: Recent Labs  Lab 08/03/21 2319 08/04/21 0953 08/05/21 0615 08/06/21 0344 08/07/21 0324  AST 14* 17  16  --   --   ALT 9 11 10   --   --   ALKPHOS 303* 345* 284*  --   --   BILITOT 0.7 0.8 0.6  --   --   PROT 6.6 6.9 6.1*  --   --   ALBUMIN 3.8 3.6 3.2* 2.9* 3.1*   No results for input(s): LIPASE, AMYLASE in the last 168 hours. No results for input(s): AMMONIA in the last 168 hours. Diabetic: No results for input(s): HGBA1C in the last 72 hours. No results for input(s): GLUCAP in the last 168 hours. Cardiac Enzymes: No results for input(s): CKTOTAL, CKMB, CKMBINDEX, TROPONINI in the last 168 hours. No results for input(s): PROBNP in the last 8760 hours. Coagulation Profile: No results for input(s): INR, PROTIME in the last 168 hours. Thyroid Function Tests: Recent Labs    08/07/21 0324  TSH 2.225   Lipid Profile: No results for input(s): CHOL, HDL, LDLCALC, TRIG, CHOLHDL, LDLDIRECT in the last 72 hours. Anemia Panel: Recent Labs    08/06/21 0344  VITAMINB12 381  FOLATE 5.6*  FERRITIN 697*  TIBC 199*  IRON 18*  RETICCTPCT 1.3   Urine analysis:    Component Value Date/Time   COLORURINE AMBER (A) 07/25/2020 1240   APPEARANCEUR CLOUDY (A) 07/25/2020 1240   LABSPEC 1.024 07/25/2020 1240   PHURINE 5.0 07/25/2020 1240   GLUCOSEU NEGATIVE 07/25/2020 1240   HGBUR SMALL (A) 07/25/2020 1240   BILIRUBINUR NEGATIVE 07/25/2020 1240   KETONESUR 5 (A) 07/25/2020 1240   PROTEINUR 30 (A) 07/25/2020 1240   NITRITE POSITIVE  (A) 07/25/2020 1240   LEUKOCYTESUR LARGE (A) 07/25/2020 1240   Sepsis Labs: Invalid input(s): PROCALCITONIN, LACTICIDVEN   Time coordinating discharge: 45 minutes  SIGNED:  Almon Hercules, MD  [facility] 08/08/2021, 3:28 PM

## 2021-08-09 ENCOUNTER — Ambulatory Visit (HOSPITAL_COMMUNITY): Payer: Medicare Other

## 2021-08-09 NOTE — TOC Progression Note (Signed)
Transition of Care (TOC) - Progression Note  ? ? ?Patient Details  ?Name: Stefanie Camacho ?MRN: [id number] ?Date of Birth: 1945-05-11 ? ?Transition of Care (TOC) CM/SW Contact  ?Purcell Mouton, RN ?Phone Number: ?08/09/2021, 4:31 PM ? ?Clinical Narrative:    ?In pt's discharge chart opened related to [facility] calling to ask for Oxycodone order. There was no order for Oxycodone. A call was made to [facility] to ask if their MD would be able to help with an order for pt's pain. [place] will follow up.  ? ? ?Expected Discharge Plan: Assisted Living ?Barriers to Discharge: No Barriers Identified ? ?Expected Discharge Plan and Services ?Expected Discharge Plan: Assisted Living ?  ?Discharge Planning Services: CM Consult ?  ?Living arrangements for the past 2 months: [place] ?Expected Discharge Date: 08/08/21               ?  ?  ?  ?  ?  ?  ?  ?  ?  ?  ? ? ?Social Determinants of Health (SDOH) Interventions ?  ? ?Readmission Risk Interventions ?   ? View : No data to display.  ?  ?  ?  ? ? ?

## 2021-08-10 ENCOUNTER — Telehealth (HOSPITAL_COMMUNITY): Payer: Self-pay | Admitting: *Deleted

## 2021-08-10 ENCOUNTER — Telehealth: Payer: Self-pay

## 2021-08-10 NOTE — Telephone Encounter (Signed)
Pt called wanting to ensure her appts were cancelled. Pt states she has enrolled into Hospice. ? ?I confirmed for pt, all of her appts have been cancelled. Pt expressed appreciation for all the CC has done for her. ?

## 2021-08-10 NOTE — Telephone Encounter (Signed)
Called patient to assess for needs and discuss follow up visits.  Per she and her son, she has decided to enroll in hospice and not peruse further treatment.  Hospice made initial visit yesterday.  Cancelled all future appointments and advised providers of patient's transition.  Son to call and cancel future CV appointments. ?

## 2021-08-11 ENCOUNTER — Ambulatory Visit: Payer: Medicare Other | Admitting: Cardiology

## 2021-08-14 ENCOUNTER — Inpatient Hospital Stay: Payer: Medicare Other

## 2021-08-16 ENCOUNTER — Inpatient Hospital Stay: Payer: Medicare Other | Admitting: Internal Medicine

## 2021-08-25 ENCOUNTER — Emergency Department (HOSPITAL_COMMUNITY): Payer: Medicare Other

## 2021-08-25 ENCOUNTER — Other Ambulatory Visit: Payer: Self-pay

## 2021-08-25 ENCOUNTER — Encounter (HOSPITAL_COMMUNITY): Payer: Self-pay | Admitting: Emergency Medicine

## 2021-08-25 ENCOUNTER — Emergency Department (HOSPITAL_COMMUNITY)
Admission: EM | Admit: 2021-08-25 | Discharge: 2021-08-25 | Disposition: A | Discharge status: Skilled Nursing Facility | Payer: Medicare Other | Attending: Emergency Medicine | Admitting: Emergency Medicine

## 2021-08-25 DIAGNOSIS — I10 Essential (primary) hypertension: Secondary | ICD-10-CM | POA: Diagnosis not present

## 2021-08-25 DIAGNOSIS — R410 Disorientation, unspecified: Secondary | ICD-10-CM | POA: Diagnosis not present

## 2021-08-25 DIAGNOSIS — Z79899 Other long term (current) drug therapy: Secondary | ICD-10-CM | POA: Insufficient documentation

## 2021-08-25 DIAGNOSIS — M25511 Pain in right shoulder: Secondary | ICD-10-CM | POA: Diagnosis not present

## 2021-08-25 DIAGNOSIS — R6889 Other general symptoms and signs: Secondary | ICD-10-CM | POA: Diagnosis not present

## 2021-08-25 DIAGNOSIS — M84411A Pathological fracture, right shoulder, initial encounter for fracture: Secondary | ICD-10-CM | POA: Diagnosis not present

## 2021-08-25 DIAGNOSIS — R519 Headache, unspecified: Secondary | ICD-10-CM | POA: Diagnosis not present

## 2021-08-25 DIAGNOSIS — M84521A Pathological fracture in neoplastic disease, right humerus, initial encounter for fracture: Secondary | ICD-10-CM

## 2021-08-25 DIAGNOSIS — Z7401 Bed confinement status: Secondary | ICD-10-CM | POA: Diagnosis not present

## 2021-08-25 DIAGNOSIS — Z85830 Personal history of malignant neoplasm of bone: Secondary | ICD-10-CM | POA: Diagnosis not present

## 2021-08-25 DIAGNOSIS — Z743 Need for continuous supervision: Secondary | ICD-10-CM | POA: Diagnosis not present

## 2021-08-25 DIAGNOSIS — K5900 Constipation, unspecified: Secondary | ICD-10-CM | POA: Diagnosis not present

## 2021-08-25 DIAGNOSIS — R531 Weakness: Secondary | ICD-10-CM | POA: Diagnosis not present

## 2021-08-25 DIAGNOSIS — R110 Nausea: Secondary | ICD-10-CM | POA: Diagnosis not present

## 2021-08-25 LAB — COMPREHENSIVE METABOLIC PANEL
ALT: 19 U/L (ref 0–44)
AST: 26 U/L (ref 15–41)
Albumin: 3.5 g/dL (ref 3.5–5.0)
Alkaline Phosphatase: 449 U/L — ABNORMAL HIGH (ref 38–126)
Anion gap: 15 (ref 5–15)
BUN: 25 mg/dL — ABNORMAL HIGH (ref 8–23)
CO2: 23 mmol/L (ref 22–32)
Calcium: 9.7 mg/dL (ref 8.9–10.3)
Chloride: 103 mmol/L (ref 98–111)
Creatinine, Ser: 1.02 mg/dL — ABNORMAL HIGH (ref 0.44–1.00)
GFR, Estimated: 58 mL/min — ABNORMAL LOW (ref >60)
Glucose, Bld: 87 mg/dL (ref 70–99)
Potassium: 3.9 mmol/L (ref 3.5–5.1)
Sodium: 141 mmol/L (ref 135–145)
Total Bilirubin: 1 mg/dL (ref 0.3–1.2)
Total Protein: 7 g/dL (ref 6.5–8.1)

## 2021-08-25 LAB — CBC WITH DIFFERENTIAL/PLATELET
Abs Immature Granulocytes: 0.03 10*3/uL (ref 0.00–0.07)
Basophils Absolute: 0 10*3/uL (ref 0.0–0.1)
Basophils Relative: 0 %
Eosinophils Absolute: 0.4 10*3/uL (ref 0.0–0.5)
Eosinophils Relative: 4 %
HCT: 38.2 % (ref 36.0–46.0)
Hemoglobin: 12.6 g/dL (ref 12.0–15.0)
Immature Granulocytes: 0 %
Lymphocytes Relative: 26 %
Lymphs Abs: 2.2 10*3/uL (ref 0.7–4.0)
MCH: 30.4 pg (ref 26.0–34.0)
MCHC: 33 g/dL (ref 30.0–36.0)
MCV: 92 fL (ref 80.0–100.0)
Monocytes Absolute: 0.8 10*3/uL (ref 0.1–1.0)
Monocytes Relative: 9 %
Neutro Abs: 5.1 10*3/uL (ref 1.7–7.7)
Neutrophils Relative %: 61 %
Platelets: 161 10*3/uL (ref 150–400)
RBC: 4.15 MIL/uL (ref 3.87–5.11)
RDW: 14.5 % (ref 11.5–15.5)
WBC: 8.4 10*3/uL (ref 4.0–10.5)
nRBC: 0 % (ref 0.0–0.2)

## 2021-08-25 MED ORDER — LACTATED RINGERS IV SOLN
INTRAVENOUS | Status: DC
Start: 2021-08-25 — End: 2021-08-26

## 2021-08-25 MED ORDER — HYDROMORPHONE HCL 1 MG/ML IJ SOLN
0.5000 mg | Freq: Once | INTRAMUSCULAR | Status: AC
  Administered 2021-08-25: 0.5 mg via INTRAVENOUS
  Filled 2021-08-25: qty 1

## 2021-08-25 MED ORDER — LACTATED RINGERS IV BOLUS
1000.0000 mL | Freq: Once | INTRAVENOUS | Status: AC
Start: 2021-08-25 — End: 2021-08-25
  Administered 2021-08-25: 1000 mL via INTRAVENOUS

## 2021-08-25 MED ORDER — METOCLOPRAMIDE HCL 5 MG/ML IJ SOLN
2.5000 mg | Freq: Once | INTRAMUSCULAR | Status: AC
  Administered 2021-08-25: 2.5 mg via INTRAVENOUS
  Filled 2021-08-25: qty 2

## 2021-08-25 NOTE — ED Notes (Addendum)
Purewick applied to pt °

## 2021-08-25 NOTE — ED Provider Notes (Addendum)
?[facility] DEPT ?Provider Note ? ? ?CSN: [id number] ?Arrival date & time: 08/25/21  1249 ? ?  ? ?History ? ?No chief complaint on file. ? ? ?Stefanie Camacho is a 76 y.o. female. ? ?75 year old female who has history of cancer presents with worsening pain.  According to EMS, patient recently had her dose of oxycodone increased from 5 mg to 10 mg.  Pain is in her shoulder and her head.  She has had nausea but no vomiting.  No mental status changes.  Denies any dysuria or hematuria.  No new neurological findings. ? ?  ? ?Home Medications ?Prior to Admission medications   ?Medication Sig Start Date End Date Taking? Authorizing Provider  ?atorvastatin (LIPITOR) 10 MG tablet Take 10 mg by mouth daily. 06/22/21   [provider]  ?enoxaparin (LOVENOX) 100 MG/ML injection Inject 0.9 mLs (90 mg total) into the skin daily. 08/09/21   Mercy Riding, MD  ?Ensure (ENSURE) Take 1 Can by mouth as needed (nutritional supplement).    [provider]  ?FLUoxetine (PROZAC) 20 MG capsule Take 20 mg by mouth daily. 04/12/20   [provider]  ?furosemide (LASIX) 20 MG tablet Take 1 tablet (20 mg total) by mouth every other day. 08/10/21   Mercy Riding, MD  ?levETIRAcetam (KEPPRA) 250 MG tablet Take 250 mg by mouth 2 (two) times daily. 08/30/20   [provider]  ?levothyroxine (SYNTHROID) 75 MCG tablet Take 1 tablet (75 mcg total) by mouth daily. 01/14/20   Hosie Poisson, MD  ?loperamide (IMODIUM) 2 MG capsule Take 2 mg by mouth every 6 (six) hours as needed for diarrhea or loose stools.    [provider]  ?metoprolol succinate (TOPROL-XL) 25 MG 24 hr tablet Take 1 tablet (25 mg total) by mouth daily. 08/09/21   Mercy Riding, MD  ?Multiple Vitamin (MULTIVITAMIN WITH MINERALS) TABS tablet Take 1 tablet by mouth daily. 08/09/21   Mercy Riding, MD  ?ondansetron (ZOFRAN) 4 MG tablet Take 1 tablet (4 mg total) by mouth daily as needed for nausea or vomiting. 08/08/21 08/08/22   Mercy Riding, MD  ?pantoprazole (PROTONIX) 40 MG tablet Take 1 tablet by mouth once daily ?Patient taking differently: Take 40 mg by mouth every evening. 05/08/20   Heilingoetter, Cassandra L, PA-C  ?Polyethyl Glycol-Propyl Glycol (SYSTANE HYDRATION PF OP) Apply 1 drop to eye every 6 (six) hours as needed (dry eyes).    [provider]  ?polyethylene glycol (MIRALAX / GLYCOLAX) 17 g packet Take 17 g by mouth 2 (two) times daily as needed for mild constipation. 08/08/21   Mercy Riding, MD  ?senna-docusate (SENOKOT-S) 8.6-50 MG tablet Take 1 tablet by mouth 2 (two) times daily as needed for moderate constipation. 08/08/21   Mercy Riding, MD  ?   ? ?Allergies    ?Sertraline hcl   ? ?Review of Systems   ?Review of Systems  ?All other systems reviewed and are negative. ? ?Physical Exam ?Updated Vital Signs ?There were no vitals taken for this visit. ?Physical Exam ?Vitals and nursing note reviewed. Exam conducted with a chaperone present.  ?Constitutional:   ?   General: She is not in acute distress. ?   Appearance: Normal appearance. She is well-developed. She is not toxic-appearing.  ?HENT:  ?   Head: Normocephalic and atraumatic.  ?Eyes:  ?   General: Lids are normal.  ?   Conjunctiva/sclera: Conjunctivae normal.  ?   Pupils: Pupils are  equal, round, and reactive to light.  ?Neck:  ?   Thyroid: No thyroid mass.  ?   Trachea: No tracheal deviation.  ?Cardiovascular:  ?   Rate and Rhythm: Normal rate and regular rhythm.  ?   Heart sounds: Normal heart sounds. No murmur heard. ?  No gallop.  ?Pulmonary:  ?   Effort: Pulmonary effort is normal. No respiratory distress.  ?   Breath sounds: Normal breath sounds. No stridor. No decreased breath sounds, wheezing, rhonchi or rales.  ?Abdominal:  ?   General: There is no distension.  ?   Palpations: Abdomen is soft.  ?   Tenderness: There is no abdominal tenderness. There is no rebound.  ?Genitourinary: ?   Comments: Soft stool noted in rectal vault with firm but  compressible stool ball ?Musculoskeletal:  ?   Right shoulder: Tenderness present. Decreased range of motion.  ?     Arms: ? ?   Cervical back: Normal range of motion and neck supple.  ?Skin: ?   General: Skin is warm and dry.  ?   Findings: No abrasion or rash.  ?Neurological:  ?   Mental Status: She is alert and oriented to person, place, and time. Mental status is at baseline.  ?   GCS: GCS eye subscore is 4. GCS verbal subscore is 5. GCS motor subscore is 6.  ?   Cranial Nerves: No cranial nerve deficit.  ?   Sensory: No sensory deficit.  ?   Motor: Motor function is intact.  ?Psychiatric:     ?   Attention and Perception: Attention normal.     ?   Speech: Speech normal.     ?   Behavior: Behavior normal.  ? ? ?ED Results / Procedures / Treatments   ?Labs ?(all labs ordered are listed, but only abnormal results are displayed) ?Labs Reviewed - No data to display ? ?EKG ?None ? ?Radiology ?No results found. ? ?Procedures ?Procedures  ? ? ?Medications Ordered in ED ?Medications  ?lactated ringers bolus 1,000 mL (has no administration in time range)  ?lactated ringers infusion (has no administration in time range)  ?HYDROmorphone (DILAUDID) injection 0.5 mg (has no administration in time range)  ?metoCLOPramide (REGLAN) injection 2.5 mg (has no administration in time range)  ? ? ?ED Course/ Medical Decision Making/ A&P ?  ?                        ?Medical Decision Making ?Amount and/or Complexity of Data Reviewed ?Labs: ordered. ?Radiology: ordered. ? ?Risk ?Prescription drug management. ? ? ?Patient given IV fluids along with IV pain medications.  Family is here now and states that patient is on hospice.  Patient's hospice nurse is also at bedside.  They have been managing the patient's at home at an assisted living facility along with secondary help.  Patient is severe pain in her right upper extremity and x-ray of her right humerus and shoulder shows pathologic mid shaft humerus fracture.  Patient has history of  bony metastasis.  Patient's pain is controlled at this time.  Patient has secondary complaint of constipation and rectal exam noted.  Patient's hospice nurses here and able to perform a bowel regimen to deal with this.  Splint will be applied by orthopedics.  Plan will be for pain management per the patient's palliative care doctor, Dr. Marco Collie.  Family and patient are comfortable with going back to her facility ? ? ? ? ? ? ? ?  Final Clinical Impression(s) / ED Diagnoses ?Final diagnoses:  ?None  ? ? ?Rx / DC Orders ?ED Discharge Orders   ? ? None  ? ?  ? ? ?  ?Lacretia Leigh, MD ?08/25/21 1534 ? ?  ?Lacretia Leigh, MD ?08/25/21 1537 ? ?

## 2021-08-25 NOTE — ED Triage Notes (Signed)
Pt BIBA from [facility] c/o abdominal pain and right shoulder pain x2 weeks which has worsened in the last 48 hours. EMS reports she has transitioned to palliative care recently. She reports taking 10 mg oxycodone this AM without relief. Hx GERD, adenocarcinoma stage 4 w/ metastasis. VS WDL.  ?

## 2021-08-25 NOTE — Progress Notes (Signed)
Orthopedic Tech Progress Note ?Patient Details:  ?Mystie Ormand ?05/19/1945 ?[id number] ? ?Ortho Devices ?Type of Ortho Device: Long arm splint ?Ortho Device/Splint Location: Right arm ?Ortho Device/Splint Interventions: Application ?  ?Post Interventions ?Patient Tolerated: Well ? ?Kaitlen Redford E Corrina Steffensen ?08/25/2021, 3:58 PM ? ?

## 2021-08-25 NOTE — Discharge Instructions (Addendum)
Please contact Dr. Nyra Capes for pain management of this fracture. ?

## 2021-08-29 ENCOUNTER — Telehealth: Payer: Self-pay

## 2021-08-29 NOTE — Telephone Encounter (Signed)
I contacted [employer] and spoke with Jhene to close out pts account. I advised pt has been placed on hospice. Jhene has closed the case and advised the provider and pts family may received one final close out letter with questions contained in it but it is optional and completion is not required. ?

## 2021-09-06 ENCOUNTER — Telehealth: Payer: Self-pay

## 2021-09-06 NOTE — Telephone Encounter (Signed)
Pts son called stating they received rx for Tagrisso but pt is on hospice and did not need this medication.  ? ?I have called him back and advised to take it to his local pharmacy and dispose of it in their medication returns bin. He expressed understanding of this information. ?

## 2021-09-10 ENCOUNTER — Emergency Department (HOSPITAL_COMMUNITY)
Admission: EM | Admit: 2021-09-10 | Discharge: 2021-09-10 | Disposition: A | Discharge status: Home / Self Care | Attending: Emergency Medicine | Admitting: Emergency Medicine

## 2021-09-10 ENCOUNTER — Emergency Department (HOSPITAL_COMMUNITY)

## 2021-09-10 DIAGNOSIS — I509 Heart failure, unspecified: Secondary | ICD-10-CM | POA: Insufficient documentation

## 2021-09-10 DIAGNOSIS — M47812 Spondylosis without myelopathy or radiculopathy, cervical region: Secondary | ICD-10-CM | POA: Diagnosis not present

## 2021-09-10 DIAGNOSIS — R519 Headache, unspecified: Secondary | ICD-10-CM | POA: Diagnosis present

## 2021-09-10 DIAGNOSIS — R109 Unspecified abdominal pain: Secondary | ICD-10-CM | POA: Insufficient documentation

## 2021-09-10 DIAGNOSIS — Z79899 Other long term (current) drug therapy: Secondary | ICD-10-CM | POA: Insufficient documentation

## 2021-09-10 DIAGNOSIS — S0990XA Unspecified injury of head, initial encounter: Secondary | ICD-10-CM | POA: Diagnosis not present

## 2021-09-10 DIAGNOSIS — C7932 Secondary malignant neoplasm of cerebral meninges: Secondary | ICD-10-CM | POA: Diagnosis not present

## 2021-09-10 DIAGNOSIS — W01198A Fall on same level from slipping, tripping and stumbling with subsequent striking against other object, initial encounter: Secondary | ICD-10-CM | POA: Diagnosis not present

## 2021-09-10 DIAGNOSIS — M542 Cervicalgia: Secondary | ICD-10-CM | POA: Diagnosis not present

## 2021-09-10 DIAGNOSIS — G319 Degenerative disease of nervous system, unspecified: Secondary | ICD-10-CM | POA: Diagnosis not present

## 2021-09-10 DIAGNOSIS — E039 Hypothyroidism, unspecified: Secondary | ICD-10-CM | POA: Insufficient documentation

## 2021-09-10 DIAGNOSIS — M47892 Other spondylosis, cervical region: Secondary | ICD-10-CM | POA: Diagnosis not present

## 2021-09-10 DIAGNOSIS — S199XXA Unspecified injury of neck, initial encounter: Secondary | ICD-10-CM | POA: Diagnosis not present

## 2021-09-10 DIAGNOSIS — I6782 Cerebral ischemia: Secondary | ICD-10-CM | POA: Diagnosis not present

## 2021-09-10 DIAGNOSIS — W19XXXA Unspecified fall, initial encounter: Secondary | ICD-10-CM

## 2021-09-10 DIAGNOSIS — I110 Hypertensive heart disease with heart failure: Secondary | ICD-10-CM | POA: Insufficient documentation

## 2021-09-10 DIAGNOSIS — J90 Pleural effusion, not elsewhere classified: Secondary | ICD-10-CM | POA: Diagnosis not present

## 2021-09-10 NOTE — ED Provider Notes (Signed)
?Guy ?Provider Note ? ? ?CSN: [id number] ?Arrival date & time: 09/10/21  0607 ? ?  ? ?History ?Chief Complaint  ?Patient presents with  ? Fall  ? ? ?Stefanie Camacho is a 76 y.o. female. ? ?Patient with history including hypothyroidism, hypertension, dyslipidemia, vertigo and congestive heart failure presents with a recent fall at 6am this morning. She comes from a nursing facility where she is able to complete some ADLs but has assistance and supervision as appropriate. Patient reports that she was getting up to use the restroom when after taking a few steps she felt like something was pushing her back. She ended up falling backwards with her head hitting the floor. She endorses posterior head pain that started after the fall. Denies chest pain, dyspnea, vision changes, loss of consciousness, tinnitus, dizziness, appetite changes (although has not had an appetite for awhile), nausea, vomiting and other new pain. Reports that she is eating and drinking like she normally does. Endorses abdominal pain that has been ongoing for the past 3 weeks which she has been evaluated for recently. There were no witnesses present during this fall. Has had a history of recurrent falls. Ambulates with assistance of walker which she was using at the time. Says that this episode did not feel like her vertigo that she has experienced in the past.  ? ?The history is provided by the patient. No language interpreter was used.  ?Fall ?This is a recurrent problem. The current episode started 1 to 2 hours ago. The problem occurs every several days. The problem has not changed since onset.Associated symptoms include headaches. Pertinent negatives include no chest pain, no abdominal pain and no shortness of breath.  ? ?  ? ?Home Medications ?Prior to Admission medications   ?Medication Sig Start Date End Date Taking? Authorizing Provider  ?atorvastatin (LIPITOR) 10 MG tablet Take 10 mg by mouth daily.  06/22/21   [provider]  ?enoxaparin (LOVENOX) 100 MG/ML injection Inject 0.9 mLs (90 mg total) into the skin daily. 08/09/21   Mercy Riding, MD  ?Ensure (ENSURE) Take 1 Can by mouth as needed (nutritional supplement).    [provider]  ?FLUoxetine (PROZAC) 20 MG capsule Take 20 mg by mouth daily. 04/12/20   [provider]  ?furosemide (LASIX) 20 MG tablet Take 1 tablet (20 mg total) by mouth every other day. 08/10/21   Mercy Riding, MD  ?levETIRAcetam (KEPPRA) 250 MG tablet Take 250 mg by mouth 2 (two) times daily. 08/30/20   [provider]  ?levothyroxine (SYNTHROID) 75 MCG tablet Take 1 tablet (75 mcg total) by mouth daily. 01/14/20   Hosie Poisson, MD  ?loperamide (IMODIUM) 2 MG capsule Take 2 mg by mouth every 6 (six) hours as needed for diarrhea or loose stools.    [provider]  ?metoprolol succinate (TOPROL-XL) 25 MG 24 hr tablet Take 1 tablet (25 mg total) by mouth daily. 08/09/21   Mercy Riding, MD  ?Multiple Vitamin (MULTIVITAMIN WITH MINERALS) TABS tablet Take 1 tablet by mouth daily. 08/09/21   Mercy Riding, MD  ?ondansetron (ZOFRAN) 4 MG tablet Take 1 tablet (4 mg total) by mouth daily as needed for nausea or vomiting. 08/08/21 08/08/22  Mercy Riding, MD  ?pantoprazole (PROTONIX) 40 MG tablet Take 1 tablet by mouth once daily ?Patient taking differently: Take 40 mg by mouth every evening. 05/08/20   Heilingoetter, Cassandra L, PA-C  ?Polyethyl Glycol-Propyl Glycol (SYSTANE HYDRATION PF OP) Apply  1 drop to eye every 6 (six) hours as needed (dry eyes).    [provider]  ?polyethylene glycol (MIRALAX / GLYCOLAX) 17 g packet Take 17 g by mouth 2 (two) times daily as needed for mild constipation. 08/08/21   Mercy Riding, MD  ?senna-docusate (SENOKOT-S) 8.6-50 MG tablet Take 1 tablet by mouth 2 (two) times daily as needed for moderate constipation. 08/08/21   Mercy Riding, MD  ?   ? ?Allergies    ?Sertraline hcl   ? ?Review of Systems   ?Review of Systems   ?Constitutional:  Negative for activity change, appetite change, chills, fatigue and fever.  ?HENT:  Negative for congestion.   ?Eyes:  Negative for visual disturbance.  ?Respiratory:  Negative for cough and shortness of breath.   ?Cardiovascular:  Negative for chest pain.  ?Gastrointestinal:  Negative for abdominal pain, diarrhea, nausea and vomiting.  ?Genitourinary:  Negative for dysuria.  ?Neurological:  Positive for headaches. Negative for dizziness, seizures and speech difficulty.  ?Psychiatric/Behavioral:  Negative for confusion.   ? ?Physical Exam ?Updated Vital Signs ?BP 123/61 (BP Location: Left Arm)   Pulse (!) 55   Temp (!) 97.1 ?F (36.2 ?C) (Oral)   Resp 15   SpO2 96%  ?Physical Exam ?Vitals reviewed.  ?Constitutional:   ?   General: She is not in acute distress. ?   Appearance: Normal appearance.  ?HENT:  ?   Head: Normocephalic and atraumatic.  ?   Comments: No occipital erythema or edema noted, no gross deformity noted  ?   Right Ear: External ear normal.  ?   Left Ear: External ear normal.  ?   Nose: Nose normal. No congestion or rhinorrhea.  ?   Mouth/Throat:  ?   Mouth: Mucous membranes are moist.  ?   Pharynx: Oropharynx is clear.  ?Eyes:  ?   General: No scleral icterus.    ?   Right eye: No discharge.     ?   Left eye: No discharge.  ?   Extraocular Movements: Extraocular movements intact.  ?   Conjunctiva/sclera: Conjunctivae normal.  ?Cardiovascular:  ?   Rate and Rhythm: Normal rate and regular rhythm.  ?   Pulses: Normal pulses.  ?   Heart sounds: No murmur heard. ?  No gallop.  ?Pulmonary:  ?   Effort: Pulmonary effort is normal. No respiratory distress.  ?   Breath sounds: Normal breath sounds. No wheezing or rales.  ?Abdominal:  ?   General: Bowel sounds are normal.  ?   Palpations: Abdomen is soft.  ?   Tenderness: There is no abdominal tenderness.  ?Musculoskeletal:  ?   Right lower leg: No edema.  ?   Left lower leg: No edema.  ?Lymphadenopathy:  ?   Cervical: No cervical  adenopathy.  ?Skin: ?   General: Skin is warm.  ?   Capillary Refill: Capillary refill takes less than 2 seconds.  ?   Coloration: Skin is not pale.  ?   Findings: No bruising, erythema or rash.  ?Neurological:  ?   General: No focal deficit present.  ?   Mental Status: She is alert.  ?   Cranial Nerves: No cranial nerve deficit.  ?   Sensory: No sensory deficit.  ?   Comments: Oriented to person, place and situation. Reports that it is 1944. Moves all extremities spontaneously, splint on right UE due to recent proximal humeral shaft fracture.   ?Psychiatric:     ?  Mood and Affect: Mood normal.     ?   Behavior: Behavior normal.  ? ? ?ED Results / Procedures / Treatments   ?Labs ?(all labs ordered are listed, but only abnormal results are displayed) ?Labs Reviewed - No data to display ? ?EKG ?None ? ?Radiology ?No results found. ? ?Procedures ?Procedures  ?None ? ?Medications Ordered in ED ?Medications - No data to display ? ?ED Course/ Medical Decision Making/ A&P ?  ?                        ?Medical Decision Making ?Amount and/or Complexity of Data Reviewed ?Radiology: ordered. ? ?CT head w/o contrast: ?IMPRESSION: ?1. No evidence of significant acute traumatic injury to the skull, ?brain or cervical spine. ?2. Mild cerebral atrophy with chronic microvascular ischemic changes ?in the cerebral white matter, as above. ?3. Multilevel degenerative disc disease and cervical spondylosis, as ?above. ?4. Widespread metastatic disease in the bones and thorax, compatible ?with known metastatic lung cancer.  ? ?CT cervical spine w/o contrast ?IMPRESSION: ?1. No evidence of significant acute traumatic injury to the skull, ?brain or cervical spine. ?2. Mild cerebral atrophy with chronic microvascular ischemic changes ?in the cerebral white matter, as above. ?3. Multilevel degenerative disc disease and cervical spondylosis, as ?above. ?4. Widespread metastatic disease in the bones and thorax, compatible ?with known metastatic  lung cancer.  ? ? ?Patient presents with recurrent falls from a nursing facility, with history of leptomeningeal metastasis and it seems that she is on hospice with palliative care following. DNR paperwork present w

## 2021-09-10 NOTE — ED Notes (Signed)
Patient wheelchaired to restroom  ?

## 2021-09-10 NOTE — ED Triage Notes (Signed)
Pt BIB [facility], hx of vertigo, became dizzy, fell and landed supine. C/o back of head and c-spine pain. Unknown if pt is on thinners.  ?

## 2021-09-10 NOTE — ED Notes (Signed)
[facility] contacted for transport 

## 2021-09-10 NOTE — ED Notes (Signed)
Lunch tray provided. Patient set up to eat. Denies other needs at this time ?

## 2021-09-10 NOTE — Discharge Instructions (Addendum)
You came to the emergency department due to a fall. We obtained imaging and it did not show any new changes from your fall. Please try to use your walker and get assistance to avoid future falls.  ?

## 2021-09-18 DIAGNOSIS — M25511 Pain in right shoulder: Secondary | ICD-10-CM | POA: Diagnosis not present

## 2021-09-18 DIAGNOSIS — M84421A Pathological fracture, right humerus, initial encounter for fracture: Secondary | ICD-10-CM | POA: Diagnosis not present

## 2021-09-21 ENCOUNTER — Encounter (HOSPITAL_COMMUNITY): Payer: Self-pay | Admitting: Emergency Medicine

## 2021-09-21 DIAGNOSIS — R531 Weakness: Secondary | ICD-10-CM | POA: Diagnosis not present

## 2021-09-21 DIAGNOSIS — Z743 Need for continuous supervision: Secondary | ICD-10-CM | POA: Diagnosis not present

## 2021-09-22 ENCOUNTER — Other Ambulatory Visit: Payer: Medicare Other

## 2021-09-26 ENCOUNTER — Ambulatory Visit: Payer: Medicare Other | Admitting: Internal Medicine

## 2021-10-10 ENCOUNTER — Encounter: Payer: Self-pay | Admitting: Internal Medicine

## 2021-10-11 ENCOUNTER — Encounter: Payer: Self-pay | Admitting: Internal Medicine

## 2021-10-31 ENCOUNTER — Telehealth: Payer: Self-pay | Admitting: Medical Oncology

## 2021-11-04 DEATH — deceased

## 2021-11-13 ENCOUNTER — Ambulatory Visit: Payer: Medicare Other | Admitting: Cardiology

## 2021-11-27 ENCOUNTER — Other Ambulatory Visit: Payer: Self-pay

## 2021-11-27 ENCOUNTER — Other Ambulatory Visit: Payer: Self-pay | Admitting: Medical Oncology

## 2021-12-18 ENCOUNTER — Ambulatory Visit: Payer: Medicare Other | Admitting: Cardiology

## 2021-12-20 NOTE — Progress Notes (Signed)
This encounter was to check pts O2 saturations only. No provider visit was required.

## 2022-05-24 IMAGING — CT NM PET TUM IMG INITIAL (PI) SKULL BASE T - THIGH
1 of 7 series · 1 of 25 positions shown · non-contrast
Comparison: Chest CT from January 10, 2020

CLINICAL DATA: Initial treatment strategy for non-small cell lung
cancer.

EXAM:
NUCLEAR MEDICINE PET SKULL BASE TO THIGH
TECHNIQUE: 10.1 mCi F-18 FDG was injected intravenously. Full-ring PET imaging
was performed from the skull base to thigh after the radiotracer. CT
data was obtained and used for attenuation correction and anatomic
localization.
Fasting blood glucose: 92 mg/dl

[Series 4: ct sk_thigh 5.0 b31f · axial · 5.0mm · 0.98mm/px · 1 of 225 slices shown]
[im 225/225  brain]
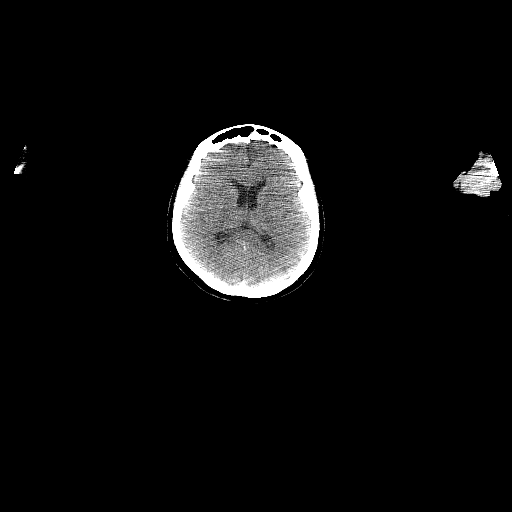

[1 of 25 positions shown; findings below may reference images not displayed]

FINDINGS: Mediastinal blood pool activity: SUV max

Liver activity: SUV max

NECK: Calvarial and brain metastases better demonstrated on recent
MRI, not well seen on PET evaluation. No hypermetabolic lymph nodes
in the neck.

Incidental CT findings: None

CHEST:

Innumerable pulmonary nodules all with increased FDG uptake to
varying degrees. LEFT lower lobe pulmonary mass seen on previous
imaging grossly stable in the interval, as measured on the previous
exam and on the current exam at approximately 4.8 x 4.2 cm.
Partially obscured by atelectasis on the current study with intense
metabolic activity (SUVmax = 13.3)

RIGHT upper lobe pulmonary nodule 11 mm (image 78, series 4) (SUVmax
= 8.2)

Numerous additional pulmonary nodules, too numerous to count in the
bilateral chest. Not associated with pleural effusion aside from a
small amount of fluid adjacent to the dominant mass in the LEFT
lower lobe all showing increased FDG uptake as discussed. No
adenopathy by size criteria. Mildly enlarged prevascular lymph node
on image 66 of series 4 shows a maximum SUV of 3.85.

Enlarged pre pericardial lymph node with some calcification measures
10 mm short axis with a maximum SUV of

Intensely FDG avid area in LEFT thyroid bed corresponds to an area
of low attenuation contiguous with the LEFT thyroid lobe (image 47,
series 4) maximum SUV of

Incidental CT findings: Calcified atheromatous plaque in the
thoracic aorta. No aneurysmal dilation. No pericardial effusion.
Esophagus grossly normal.

ABDOMEN/PELVIS: Subtle nodule associated with the inferior LEFT
adrenal gland (SUVmax = 6.1 (image 114 of series 4) 7 mm. RIGHT
adrenal gland without discrete mass, uptake difficult to separate
from adjacent liver tiny lymph node posterior to the aorta on image
133 of series 4 with an SUV of 4.6)

Incidental CT findings: Low-density area in the liver corresponding
to hemangioma seen on prior imaging. No acute gastrointestinal
process. Calcified atheromatous plaque of the abdominal aorta, mild
and without aneurysmal dilation. No ascites. Post hysterectomy.

SKELETON: Multifocal areas of bony metastatic disease with marked
increased FDG uptake, some areas seen on previous imaging and others
occult on CT. T5 RIGHT transverse process on image 67 of series 4 is
an example of a lucent lesion that is visible on CT with increased
metabolic activity (SUVmax = 12.2)

Another area in the spine that is occult on CT shows increased FDG
uptake at the T6 vertebral level (image 81 of series 4, only subtle
changes can be appreciated in hindsight in this area) (SUVmax =

Faint uptake in the C3 vertebral body raising the question of
metastatic disease in this location with similar uptake also in the
T3 vertebral body, activity at C3 is 5.3)

Areas of uptake within the RIGHT humerus in the humeral shaft
without corresponding CT abnormality (image 34 of series 4

RIGHT-sided rib uptake affecting RIGHT sixth rib most notably on
image 89 of series 4 with a maximum SUV of 9.7 subtle changes
elsewhere in the ribs bilaterally compatible with multifocal
involvement.

L1 lucent lesion involving posterior vertebral body measuring 11 mm
(image 111 of series 4) (SUVmax = 11.3. This violates posterior
cortex. Similar small area of lucency and L2 also showing increased
metabolic activity along with the LEFT transverse process at L2.

Posterior elements at L2 with spinous process involvement, also
spinous process involvement at L5. Lytic lesion in the sacrum on the
RIGHT at the S1 level (image 149 of series [DATE] cm (SUVmax = 12.3)

Multifocal areas of sacral involvement, RIGHT-sided sacral lesion
involves the sacral ala. There is scattered areas in the LEFT sacral
ala that are occult on CT or nearly occult, near the S2 neural
foramen for instance is an approximately 1 cm lesion with a maximum
SUV of 13.9 (image 158 of series 4)

Intense FDG uptake involving the RIGHT iliac above the acetabulum in
the area of abnormality discussed on the previous CT evaluation.
Mixed lytic and sclerotic change of the RIGHT pubic root is similar
to the prior study and risk for pathologic fracture based on
location with intense metabolic activity extending to the medial
wall of the acetabulum on image 175 of series 3 (SUVmax = 10.9)
similar activity in the LEFT acetabulum with a frankly destructive
lesion at the LEFT pubic root on image 176 of series 4 measuring
approximately 1.9 cm. Also with metastatic focus in the RIGHT
femoral metaphysis that is occult on CT. Similarly LEFT femoral
metaphyseal and trochanteric lesions are occult on CT.

LEFT iliac with 3 discrete areas of increased metabolic uptake that
show faint CT correlate.

Incidental CT findings: RIGHT sixth rib region select region lesion
is associated with pathologic fracture which is subacute.

Spinal degenerative changes.
IMPRESSION: 1. Findings of LEFT large pulmonary mass with innumerable pulmonary
nodules and widespread bony metastatic disease.
2. LEFT adrenal uptake suspicious for early adrenal metastases.
3. Calvarial and brain metastases better demonstrated on recent MRI.
4. LEFT thyroid bed uptake may represent a small lymph node adjacent
to the thyroid or discrete thyroid lesion. Thyroid ultrasound is
suggested for further assessment to determine whether second primary
is possible with biopsy as warranted.
5. Bilateral pelvic metastases particularly at the LEFT and RIGHT
pubic root and RIGHT acetabulum are risk for pathologic fracture.

These results will be called to the ordering clinician or
representative by the Radiologist Assistant, and communication
documented in the PACS or [REDACTED].
